# Patient Record
Sex: Female | Born: 1935 | Race: White | Hispanic: No | Smoking: Former smoker
Health system: Southern US, Community
[De-identification: ages and names within clinical notes are randomized; demographics above are authoritative.]

## PROBLEM LIST (undated history)

## (undated) DIAGNOSIS — H6123 Impacted cerumen, bilateral: Secondary | ICD-10-CM

## (undated) DIAGNOSIS — R112 Nausea with vomiting, unspecified: Secondary | ICD-10-CM

## (undated) DIAGNOSIS — G4700 Insomnia, unspecified: Secondary | ICD-10-CM

## (undated) DIAGNOSIS — T4145XA Adverse effect of unspecified anesthetic, initial encounter: Secondary | ICD-10-CM

## (undated) DIAGNOSIS — K449 Diaphragmatic hernia without obstruction or gangrene: Secondary | ICD-10-CM

## (undated) DIAGNOSIS — E559 Vitamin D deficiency, unspecified: Secondary | ICD-10-CM

## (undated) DIAGNOSIS — M8580 Other specified disorders of bone density and structure, unspecified site: Secondary | ICD-10-CM

## (undated) DIAGNOSIS — E7800 Pure hypercholesterolemia, unspecified: Secondary | ICD-10-CM

## (undated) DIAGNOSIS — Z8709 Personal history of other diseases of the respiratory system: Secondary | ICD-10-CM

## (undated) DIAGNOSIS — Z9889 Other specified postprocedural states: Secondary | ICD-10-CM

## (undated) DIAGNOSIS — T8859XA Other complications of anesthesia, initial encounter: Secondary | ICD-10-CM

## (undated) DIAGNOSIS — R7303 Prediabetes: Secondary | ICD-10-CM

## (undated) DIAGNOSIS — C3490 Malignant neoplasm of unspecified part of unspecified bronchus or lung: Secondary | ICD-10-CM

## (undated) DIAGNOSIS — D649 Anemia, unspecified: Secondary | ICD-10-CM

## (undated) DIAGNOSIS — F324 Major depressive disorder, single episode, in partial remission: Secondary | ICD-10-CM

## (undated) DIAGNOSIS — K5790 Diverticulosis of intestine, part unspecified, without perforation or abscess without bleeding: Secondary | ICD-10-CM

## (undated) DIAGNOSIS — M545 Low back pain: Secondary | ICD-10-CM

## (undated) DIAGNOSIS — L409 Psoriasis, unspecified: Secondary | ICD-10-CM

## (undated) DIAGNOSIS — K589 Irritable bowel syndrome without diarrhea: Secondary | ICD-10-CM

## (undated) DIAGNOSIS — M1990 Unspecified osteoarthritis, unspecified site: Secondary | ICD-10-CM

## (undated) DIAGNOSIS — N183 Chronic kidney disease, stage 3 (moderate): Secondary | ICD-10-CM

## (undated) DIAGNOSIS — F0390 Unspecified dementia without behavioral disturbance: Secondary | ICD-10-CM

## (undated) HISTORY — DX: Psoriasis, unspecified: L40.9

## (undated) HISTORY — PX: COLONOSCOPY: SHX174

## (undated) HISTORY — DX: Other specified disorders of bone density and structure, unspecified site: M85.80

## (undated) HISTORY — DX: Anemia, unspecified: D64.9

## (undated) HISTORY — DX: Prediabetes: R73.03

## (undated) HISTORY — DX: Unspecified dementia, unspecified severity, without behavioral disturbance, psychotic disturbance, mood disturbance, and anxiety: F03.90

## (undated) HISTORY — DX: Major depressive disorder, single episode, in partial remission: F32.4

## (undated) HISTORY — DX: Insomnia, unspecified: G47.00

## (undated) HISTORY — PX: LAPAROSCOPIC LYSIS INTESTINAL ADHESIONS: SUR778

## (undated) HISTORY — DX: Impacted cerumen, bilateral: H61.23

## (undated) HISTORY — DX: Chronic kidney disease, stage 3 (moderate): N18.3

## (undated) HISTORY — PX: BUNIONECTOMY: SHX129

## (undated) HISTORY — DX: Personal history of other diseases of the respiratory system: Z87.09

## (undated) HISTORY — DX: Unspecified osteoarthritis, unspecified site: M19.90

## (undated) HISTORY — DX: Pure hypercholesterolemia, unspecified: E78.00

## (undated) HISTORY — PX: APPENDECTOMY: SHX54

## (undated) HISTORY — DX: Vitamin D deficiency, unspecified: E55.9

## (undated) HISTORY — DX: Diaphragmatic hernia without obstruction or gangrene: K44.9

## (undated) HISTORY — DX: Irritable bowel syndrome, unspecified: K58.9

## (undated) HISTORY — PX: POLYPECTOMY: SHX149

## (undated) HISTORY — DX: Malignant neoplasm of unspecified part of unspecified bronchus or lung: C34.90

## (undated) HISTORY — DX: Diverticulosis of intestine, part unspecified, without perforation or abscess without bleeding: K57.90

## (undated) HISTORY — DX: Low back pain: M54.5

---

## 1967-06-18 HISTORY — PX: ABDOMINAL HYSTERECTOMY: SHX81

## 1974-06-17 HISTORY — PX: CHOLECYSTECTOMY: SHX55

## 1998-03-02 ENCOUNTER — Other Ambulatory Visit: Admission: RE | Admit: 1998-03-02 | Discharge: 1998-03-02 | Payer: Self-pay | Admitting: Obstetrics and Gynecology

## 1998-06-01 ENCOUNTER — Emergency Department (HOSPITAL_COMMUNITY): Admission: EM | Admit: 1998-06-01 | Discharge: 1998-06-02 | Payer: Self-pay | Admitting: Emergency Medicine

## 1999-03-19 ENCOUNTER — Other Ambulatory Visit: Admission: RE | Admit: 1999-03-19 | Discharge: 1999-03-19 | Payer: Self-pay | Admitting: Obstetrics and Gynecology

## 1999-06-18 HISTORY — PX: OTHER SURGICAL HISTORY: SHX169

## 1999-12-27 ENCOUNTER — Ambulatory Visit (HOSPITAL_BASED_OUTPATIENT_CLINIC_OR_DEPARTMENT_OTHER): Admission: RE | Admit: 1999-12-27 | Discharge: 1999-12-27 | Payer: Self-pay | Admitting: Orthopedic Surgery

## 2000-01-29 ENCOUNTER — Ambulatory Visit (HOSPITAL_BASED_OUTPATIENT_CLINIC_OR_DEPARTMENT_OTHER): Admission: RE | Admit: 2000-01-29 | Discharge: 2000-01-29 | Payer: Self-pay | Admitting: Orthopedic Surgery

## 2000-03-24 ENCOUNTER — Other Ambulatory Visit: Admission: RE | Admit: 2000-03-24 | Discharge: 2000-03-24 | Payer: Self-pay | Admitting: Obstetrics and Gynecology

## 2001-05-12 ENCOUNTER — Other Ambulatory Visit: Admission: RE | Admit: 2001-05-12 | Discharge: 2001-05-12 | Payer: Self-pay | Admitting: Obstetrics and Gynecology

## 2003-08-29 ENCOUNTER — Other Ambulatory Visit: Admission: RE | Admit: 2003-08-29 | Discharge: 2003-08-29 | Payer: Self-pay | Admitting: Obstetrics and Gynecology

## 2003-09-01 ENCOUNTER — Ambulatory Visit (HOSPITAL_COMMUNITY): Admission: RE | Admit: 2003-09-01 | Discharge: 2003-09-01 | Payer: Self-pay | Admitting: Gastroenterology

## 2004-05-23 ENCOUNTER — Ambulatory Visit: Payer: Self-pay | Admitting: Pulmonary Disease

## 2004-06-04 ENCOUNTER — Ambulatory Visit: Payer: Self-pay

## 2004-06-17 DIAGNOSIS — Z85118 Personal history of other malignant neoplasm of bronchus and lung: Secondary | ICD-10-CM

## 2004-06-17 HISTORY — DX: Personal history of other malignant neoplasm of bronchus and lung: Z85.118

## 2004-06-21 ENCOUNTER — Ambulatory Visit: Payer: Self-pay | Admitting: Pulmonary Disease

## 2004-08-29 ENCOUNTER — Other Ambulatory Visit: Admission: RE | Admit: 2004-08-29 | Discharge: 2004-08-29 | Payer: Self-pay | Admitting: Addiction Medicine

## 2004-10-05 ENCOUNTER — Ambulatory Visit: Payer: Self-pay | Admitting: Pulmonary Disease

## 2004-10-12 ENCOUNTER — Ambulatory Visit: Payer: Self-pay | Admitting: Internal Medicine

## 2004-11-07 ENCOUNTER — Ambulatory Visit (HOSPITAL_COMMUNITY): Admission: RE | Admit: 2004-11-07 | Discharge: 2004-11-07 | Payer: Self-pay | Admitting: Thoracic Surgery

## 2004-11-15 HISTORY — PX: OTHER SURGICAL HISTORY: SHX169

## 2004-11-20 ENCOUNTER — Encounter (INDEPENDENT_AMBULATORY_CARE_PROVIDER_SITE_OTHER): Payer: Self-pay | Admitting: *Deleted

## 2004-11-20 ENCOUNTER — Inpatient Hospital Stay (HOSPITAL_COMMUNITY): Admission: RE | Admit: 2004-11-20 | Discharge: 2004-11-24 | Payer: Self-pay | Admitting: Thoracic Surgery

## 2004-12-04 ENCOUNTER — Encounter: Admission: RE | Admit: 2004-12-04 | Discharge: 2004-12-04 | Payer: Self-pay | Admitting: Thoracic Surgery

## 2004-12-13 ENCOUNTER — Ambulatory Visit: Payer: Self-pay | Admitting: Pulmonary Disease

## 2004-12-13 ENCOUNTER — Ambulatory Visit (HOSPITAL_COMMUNITY): Admission: RE | Admit: 2004-12-13 | Discharge: 2004-12-13 | Payer: Self-pay | Admitting: Gastroenterology

## 2004-12-14 ENCOUNTER — Ambulatory Visit: Payer: Self-pay | Admitting: Gastroenterology

## 2004-12-26 ENCOUNTER — Encounter: Admission: RE | Admit: 2004-12-26 | Discharge: 2004-12-26 | Payer: Self-pay | Admitting: Thoracic Surgery

## 2005-01-23 ENCOUNTER — Encounter: Admission: RE | Admit: 2005-01-23 | Discharge: 2005-01-23 | Payer: Self-pay | Admitting: Thoracic Surgery

## 2005-05-01 ENCOUNTER — Encounter: Admission: RE | Admit: 2005-05-01 | Discharge: 2005-05-01 | Payer: Self-pay | Admitting: Thoracic Surgery

## 2005-09-04 ENCOUNTER — Encounter: Admission: RE | Admit: 2005-09-04 | Discharge: 2005-09-04 | Payer: Self-pay | Admitting: Thoracic Surgery

## 2005-12-17 ENCOUNTER — Encounter: Admission: RE | Admit: 2005-12-17 | Discharge: 2005-12-17 | Payer: Self-pay | Admitting: Thoracic Surgery

## 2006-01-01 ENCOUNTER — Ambulatory Visit: Payer: Self-pay | Admitting: Pulmonary Disease

## 2006-01-06 ENCOUNTER — Ambulatory Visit: Payer: Self-pay | Admitting: Pulmonary Disease

## 2006-02-19 ENCOUNTER — Ambulatory Visit: Payer: Self-pay | Admitting: Pulmonary Disease

## 2006-04-02 ENCOUNTER — Ambulatory Visit: Payer: Self-pay | Admitting: Pulmonary Disease

## 2006-04-24 ENCOUNTER — Ambulatory Visit: Payer: Self-pay | Admitting: Pulmonary Disease

## 2006-04-24 LAB — CONVERTED CEMR LAB
ALT: 27 units/L (ref 0–40)
AST: 25 units/L (ref 0–37)
Albumin: 4 g/dL (ref 3.5–5.2)
Alkaline Phosphatase: 64 units/L (ref 39–117)
BUN: 20 mg/dL (ref 6–23)
CO2: 29 meq/L (ref 19–32)
Calcium: 10 mg/dL (ref 8.4–10.5)
Chloride: 106 meq/L (ref 96–112)
Chol/HDL Ratio, serum: 3.8
Cholesterol: 181 mg/dL (ref 0–200)
Creatinine, Ser: 1 mg/dL (ref 0.4–1.2)
GFR calc non Af Amer: 58 mL/min
Glomerular Filtration Rate, Af Am: 70 mL/min/{1.73_m2}
Glucose, Bld: 101 mg/dL — ABNORMAL HIGH (ref 70–99)
HDL: 47.9 mg/dL (ref >39.0)
LDL Cholesterol: 95 mg/dL (ref 0–99)
Potassium: 4.8 meq/L (ref 3.5–5.1)
Sodium: 141 meq/L (ref 135–145)
Total Bilirubin: 1.1 mg/dL (ref 0.3–1.2)
Total Protein: 7.1 g/dL (ref 6.0–8.3)
Triglyceride fasting, serum: 191 mg/dL — ABNORMAL HIGH (ref 0–149)
VLDL: 38 mg/dL (ref 0–40)

## 2006-06-27 ENCOUNTER — Ambulatory Visit: Payer: Self-pay | Admitting: Pulmonary Disease

## 2006-06-27 LAB — CONVERTED CEMR LAB
Bilirubin Urine: NEGATIVE
Ketones, ur: NEGATIVE mg/dL
Leukocytes, UA: NEGATIVE
Nitrite: NEGATIVE
Specific Gravity, Urine: <=1.005 (ref 1.000–1.03)
Total Protein, Urine: NEGATIVE mg/dL
Urine Glucose: NEGATIVE mg/dL
Urobilinogen, UA: 0.2 (ref 0.0–1.0)
pH: 6.5 (ref 5.0–8.0)

## 2006-08-19 ENCOUNTER — Ambulatory Visit: Payer: Self-pay | Admitting: Thoracic Surgery

## 2006-08-19 ENCOUNTER — Encounter: Admission: RE | Admit: 2006-08-19 | Discharge: 2006-08-19 | Payer: Self-pay | Admitting: Thoracic Surgery

## 2006-09-08 ENCOUNTER — Other Ambulatory Visit: Admission: RE | Admit: 2006-09-08 | Discharge: 2006-09-08 | Payer: Self-pay | Admitting: Obstetrics and Gynecology

## 2007-02-04 ENCOUNTER — Ambulatory Visit: Payer: Self-pay | Admitting: Pulmonary Disease

## 2007-02-04 LAB — CONVERTED CEMR LAB
ALT: 44 units/L — ABNORMAL HIGH (ref 0–35)
AST: 32 units/L (ref 0–37)
Albumin: 4.2 g/dL (ref 3.5–5.2)
Alkaline Phosphatase: 84 units/L (ref 39–117)
BUN: 20 mg/dL (ref 6–23)
Basophils Absolute: 0 10*3/uL (ref 0.0–0.1)
Basophils Relative: 0.5 % (ref 0.0–1.0)
Bilirubin Urine: NEGATIVE
Bilirubin, Direct: 0.2 mg/dL (ref 0.0–0.3)
CO2: 27 meq/L (ref 19–32)
Calcium: 9.9 mg/dL (ref 8.4–10.5)
Chloride: 106 meq/L (ref 96–112)
Creatinine, Ser: 0.9 mg/dL (ref 0.4–1.2)
Crystals: NEGATIVE
Eosinophils Absolute: 0.3 10*3/uL (ref 0.0–0.6)
Eosinophils Relative: 3.3 % (ref 0.0–5.0)
GFR calc Af Amer: 79 mL/min
GFR calc non Af Amer: 66 mL/min
Glucose, Bld: 101 mg/dL — ABNORMAL HIGH (ref 70–99)
HCT: 38.1 % (ref 36.0–46.0)
Hemoglobin: 13.3 g/dL (ref 12.0–15.0)
Ketones, ur: NEGATIVE mg/dL
Lymphocytes Relative: 23.1 % (ref 12.0–46.0)
MCHC: 35 g/dL (ref 30.0–36.0)
MCV: 87.8 fL (ref 78.0–100.0)
Monocytes Absolute: 0.6 10*3/uL (ref 0.2–0.7)
Monocytes Relative: 8.2 % (ref 3.0–11.0)
Mucus, UA: NEGATIVE
Neutro Abs: 5.2 10*3/uL (ref 1.4–7.7)
Neutrophils Relative %: 64.9 % (ref 43.0–77.0)
Nitrite: NEGATIVE
Platelets: 294 10*3/uL (ref 150–400)
Potassium: 4.9 meq/L (ref 3.5–5.1)
RBC: 4.33 M/uL (ref 3.87–5.11)
RDW: 12.7 % (ref 11.5–14.6)
Sodium: 141 meq/L (ref 135–145)
Specific Gravity, Urine: 1.02 (ref 1.000–1.03)
TSH: 1.27 microintl units/mL (ref 0.35–5.50)
Total Bilirubin: 1.1 mg/dL (ref 0.3–1.2)
Total Protein, Urine: NEGATIVE mg/dL
Total Protein: 7.5 g/dL (ref 6.0–8.3)
Urine Glucose: NEGATIVE mg/dL
Urobilinogen, UA: 0.2 (ref 0.0–1.0)
WBC: 7.9 10*3/uL (ref 4.5–10.5)
pH: 5.5 (ref 5.0–8.0)

## 2007-03-11 ENCOUNTER — Encounter: Admission: RE | Admit: 2007-03-11 | Discharge: 2007-03-11 | Payer: Self-pay | Admitting: Thoracic Surgery

## 2007-03-11 ENCOUNTER — Ambulatory Visit: Payer: Self-pay | Admitting: Thoracic Surgery

## 2007-03-18 HISTORY — PX: OTHER SURGICAL HISTORY: SHX169

## 2007-03-23 ENCOUNTER — Ambulatory Visit (HOSPITAL_COMMUNITY): Admission: RE | Admit: 2007-03-23 | Discharge: 2007-03-24 | Payer: Self-pay | Admitting: Obstetrics and Gynecology

## 2007-06-15 ENCOUNTER — Telehealth: Payer: Self-pay | Admitting: Pulmonary Disease

## 2007-06-23 ENCOUNTER — Ambulatory Visit: Payer: Self-pay | Admitting: Pulmonary Disease

## 2007-06-23 DIAGNOSIS — E7800 Pure hypercholesterolemia, unspecified: Secondary | ICD-10-CM | POA: Insufficient documentation

## 2007-06-23 HISTORY — DX: Pure hypercholesterolemia, unspecified: E78.00

## 2007-06-23 LAB — CONVERTED CEMR LAB
ALT: 25 units/L (ref 0–35)
AST: 23 units/L (ref 0–37)
Albumin: 4 g/dL (ref 3.5–5.2)
Alkaline Phosphatase: 63 units/L (ref 39–117)
Bilirubin, Direct: 0.2 mg/dL (ref 0.0–0.3)
Cholesterol: 162 mg/dL (ref 0–200)
HDL: 42.5 mg/dL (ref >39.0)
LDL Cholesterol: 80 mg/dL (ref 0–99)
Total Bilirubin: 1.2 mg/dL (ref 0.3–1.2)
Total CHOL/HDL Ratio: 3.8
Total Protein: 7.4 g/dL (ref 6.0–8.3)
Triglycerides: 198 mg/dL — ABNORMAL HIGH (ref 0–149)
VLDL: 40 mg/dL (ref 0–40)

## 2007-06-26 DIAGNOSIS — K589 Irritable bowel syndrome without diarrhea: Secondary | ICD-10-CM

## 2007-06-26 DIAGNOSIS — J309 Allergic rhinitis, unspecified: Secondary | ICD-10-CM | POA: Insufficient documentation

## 2007-06-26 DIAGNOSIS — K219 Gastro-esophageal reflux disease without esophagitis: Secondary | ICD-10-CM | POA: Insufficient documentation

## 2007-06-26 DIAGNOSIS — K449 Diaphragmatic hernia without obstruction or gangrene: Secondary | ICD-10-CM

## 2007-06-26 DIAGNOSIS — M1990 Unspecified osteoarthritis, unspecified site: Secondary | ICD-10-CM | POA: Insufficient documentation

## 2007-06-26 DIAGNOSIS — Z8679 Personal history of other diseases of the circulatory system: Secondary | ICD-10-CM | POA: Insufficient documentation

## 2007-06-26 HISTORY — DX: Diaphragmatic hernia without obstruction or gangrene: K44.9

## 2007-06-26 HISTORY — DX: Gastro-esophageal reflux disease without esophagitis: K21.9

## 2007-06-26 HISTORY — DX: Irritable bowel syndrome, unspecified: K58.9

## 2007-07-30 ENCOUNTER — Telehealth: Payer: Self-pay | Admitting: Pulmonary Disease

## 2007-08-06 ENCOUNTER — Telehealth: Payer: Self-pay | Admitting: Pulmonary Disease

## 2007-08-27 ENCOUNTER — Encounter: Payer: Self-pay | Admitting: Pulmonary Disease

## 2007-09-09 ENCOUNTER — Ambulatory Visit: Payer: Self-pay | Admitting: Thoracic Surgery

## 2007-09-09 ENCOUNTER — Encounter: Payer: Self-pay | Admitting: Pulmonary Disease

## 2007-09-09 ENCOUNTER — Encounter: Admission: RE | Admit: 2007-09-09 | Discharge: 2007-09-09 | Payer: Self-pay | Admitting: Thoracic Surgery

## 2007-12-10 ENCOUNTER — Telehealth: Payer: Self-pay | Admitting: Pulmonary Disease

## 2008-03-15 ENCOUNTER — Ambulatory Visit: Payer: Self-pay | Admitting: Thoracic Surgery

## 2008-03-15 ENCOUNTER — Encounter: Admission: RE | Admit: 2008-03-15 | Discharge: 2008-03-15 | Payer: Self-pay | Admitting: Thoracic Surgery

## 2008-06-13 ENCOUNTER — Telehealth (INDEPENDENT_AMBULATORY_CARE_PROVIDER_SITE_OTHER): Payer: Self-pay | Admitting: *Deleted

## 2008-08-03 ENCOUNTER — Ambulatory Visit: Payer: Self-pay | Admitting: Pulmonary Disease

## 2008-08-03 DIAGNOSIS — Z87440 Personal history of urinary (tract) infections: Secondary | ICD-10-CM | POA: Insufficient documentation

## 2008-08-03 DIAGNOSIS — M8580 Other specified disorders of bone density and structure, unspecified site: Secondary | ICD-10-CM | POA: Insufficient documentation

## 2008-08-03 DIAGNOSIS — K5730 Diverticulosis of large intestine without perforation or abscess without bleeding: Secondary | ICD-10-CM | POA: Insufficient documentation

## 2008-08-03 DIAGNOSIS — C3490 Malignant neoplasm of unspecified part of unspecified bronchus or lung: Secondary | ICD-10-CM | POA: Insufficient documentation

## 2008-08-03 DIAGNOSIS — M545 Low back pain, unspecified: Secondary | ICD-10-CM | POA: Insufficient documentation

## 2008-08-03 DIAGNOSIS — G4700 Insomnia, unspecified: Secondary | ICD-10-CM | POA: Insufficient documentation

## 2008-08-03 DIAGNOSIS — J40 Bronchitis, not specified as acute or chronic: Secondary | ICD-10-CM | POA: Insufficient documentation

## 2008-08-08 ENCOUNTER — Ambulatory Visit: Payer: Self-pay | Admitting: Pulmonary Disease

## 2008-08-08 LAB — CONVERTED CEMR LAB: Vit D, 25-Hydroxy: 42 ng/mL (ref 30–89)

## 2008-08-20 LAB — CONVERTED CEMR LAB
ALT: 29 units/L (ref 0–35)
AST: 31 units/L (ref 0–37)
Albumin: 4 g/dL (ref 3.5–5.2)
Alkaline Phosphatase: 51 units/L (ref 39–117)
BUN: 15 mg/dL (ref 6–23)
Basophils Absolute: 0.2 10*3/uL — ABNORMAL HIGH (ref 0.0–0.1)
Basophils Relative: 3.2 % — ABNORMAL HIGH (ref 0.0–3.0)
Bilirubin, Direct: 0.2 mg/dL (ref 0.0–0.3)
CO2: 30 meq/L (ref 19–32)
Calcium: 9.5 mg/dL (ref 8.4–10.5)
Chloride: 104 meq/L (ref 96–112)
Cholesterol: 132 mg/dL (ref 0–200)
Creatinine, Ser: 1 mg/dL (ref 0.4–1.2)
Eosinophils Absolute: 0.3 10*3/uL (ref 0.0–0.7)
Eosinophils Relative: 5.1 % — ABNORMAL HIGH (ref 0.0–5.0)
GFR calc Af Amer: 70 mL/min
GFR calc non Af Amer: 58 mL/min
Glucose, Bld: 102 mg/dL — ABNORMAL HIGH (ref 70–99)
HCT: 38 % (ref 36.0–46.0)
HDL: 40.8 mg/dL (ref >39.0)
Hemoglobin: 13.2 g/dL (ref 12.0–15.0)
LDL Cholesterol: 62 mg/dL (ref 0–99)
Lymphocytes Relative: 19.5 % (ref 12.0–46.0)
MCHC: 34.6 g/dL (ref 30.0–36.0)
MCV: 87.2 fL (ref 78.0–100.0)
Monocytes Absolute: 0.5 10*3/uL (ref 0.1–1.0)
Monocytes Relative: 7.5 % (ref 3.0–12.0)
Neutro Abs: 4 10*3/uL (ref 1.4–7.7)
Neutrophils Relative %: 64.7 % (ref 43.0–77.0)
Platelets: 238 10*3/uL (ref 150–400)
Potassium: 4.6 meq/L (ref 3.5–5.1)
RBC: 4.36 M/uL (ref 3.87–5.11)
RDW: 12.5 % (ref 11.5–14.6)
Sodium: 141 meq/L (ref 135–145)
TSH: 1.54 microintl units/mL (ref 0.35–5.50)
Total Bilirubin: 1.1 mg/dL (ref 0.3–1.2)
Total CHOL/HDL Ratio: 3.2
Total Protein: 6.8 g/dL (ref 6.0–8.3)
Triglycerides: 144 mg/dL (ref 0–149)
VLDL: 29 mg/dL (ref 0–40)
WBC: 6.2 10*3/uL (ref 4.5–10.5)

## 2008-08-29 ENCOUNTER — Encounter: Payer: Self-pay | Admitting: Pulmonary Disease

## 2008-09-05 ENCOUNTER — Ambulatory Visit: Payer: Self-pay | Admitting: Obstetrics and Gynecology

## 2008-09-13 ENCOUNTER — Ambulatory Visit: Payer: Self-pay | Admitting: Thoracic Surgery

## 2008-09-13 ENCOUNTER — Encounter: Admission: RE | Admit: 2008-09-13 | Discharge: 2008-09-13 | Payer: Self-pay | Admitting: Thoracic Surgery

## 2008-10-13 ENCOUNTER — Encounter: Payer: Self-pay | Admitting: Obstetrics and Gynecology

## 2008-10-13 ENCOUNTER — Ambulatory Visit: Payer: Self-pay | Admitting: Obstetrics and Gynecology

## 2008-10-13 ENCOUNTER — Other Ambulatory Visit: Admission: RE | Admit: 2008-10-13 | Discharge: 2008-10-13 | Payer: Self-pay | Admitting: Obstetrics and Gynecology

## 2008-10-24 ENCOUNTER — Ambulatory Visit: Payer: Self-pay | Admitting: Obstetrics and Gynecology

## 2009-01-04 ENCOUNTER — Ambulatory Visit: Payer: Self-pay | Admitting: Pulmonary Disease

## 2009-01-05 LAB — CONVERTED CEMR LAB
Folate: >20 ng/mL
Magnesium: 2.2 mg/dL
Vitamin B-12: 604 pg/mL

## 2009-02-09 ENCOUNTER — Encounter: Payer: Self-pay | Admitting: Pulmonary Disease

## 2009-03-07 ENCOUNTER — Ambulatory Visit: Payer: Self-pay | Admitting: Obstetrics and Gynecology

## 2009-03-10 ENCOUNTER — Ambulatory Visit: Payer: Self-pay | Admitting: Women's Health

## 2009-03-16 ENCOUNTER — Ambulatory Visit: Payer: Self-pay | Admitting: Women's Health

## 2009-03-22 ENCOUNTER — Encounter: Admission: RE | Admit: 2009-03-22 | Discharge: 2009-03-22 | Payer: Self-pay | Admitting: Thoracic Surgery

## 2009-03-22 ENCOUNTER — Ambulatory Visit: Payer: Self-pay | Admitting: Thoracic Surgery

## 2009-03-24 ENCOUNTER — Ambulatory Visit: Payer: Self-pay | Admitting: Pulmonary Disease

## 2009-06-19 ENCOUNTER — Telehealth: Payer: Self-pay | Admitting: Pulmonary Disease

## 2009-08-03 ENCOUNTER — Telehealth (INDEPENDENT_AMBULATORY_CARE_PROVIDER_SITE_OTHER): Payer: Self-pay | Admitting: *Deleted

## 2009-08-07 ENCOUNTER — Ambulatory Visit: Payer: Self-pay | Admitting: Pulmonary Disease

## 2009-08-11 ENCOUNTER — Ambulatory Visit: Payer: Self-pay | Admitting: Pulmonary Disease

## 2009-08-11 LAB — CONVERTED CEMR LAB
ALT: 40 units/L — ABNORMAL HIGH (ref 0–35)
Alkaline Phosphatase: 64 units/L (ref 39–117)
Basophils Relative: 0.5 % (ref 0.0–3.0)
Bilirubin, Direct: 0.2 mg/dL (ref 0.0–0.3)
CO2: 30 meq/L (ref 19–32)
Calcium: 9 mg/dL (ref 8.4–10.5)
Chloride: 106 meq/L (ref 96–112)
Direct LDL: 89.6 mg/dL
Eosinophils Relative: 5.6 % — ABNORMAL HIGH (ref 0.0–5.0)
Glucose, Bld: 94 mg/dL (ref 70–99)
HDL: 57.4 mg/dL (ref >39.00)
Lymphocytes Relative: 22.3 % (ref 12.0–46.0)
Monocytes Absolute: 0.5 10*3/uL (ref 0.1–1.0)
Monocytes Relative: 8.9 % (ref 3.0–12.0)
Neutrophils Relative %: 62.7 % (ref 43.0–77.0)
Platelets: 252 10*3/uL (ref 150.0–400.0)
Potassium: 4.3 meq/L (ref 3.5–5.1)
RBC: 4 M/uL (ref 3.87–5.11)
Sodium: 142 meq/L (ref 135–145)
Total Bilirubin: 0.9 mg/dL (ref 0.3–1.2)
Total CHOL/HDL Ratio: 3
Total Protein: 6.8 g/dL (ref 6.0–8.3)
Triglycerides: 271 mg/dL — ABNORMAL HIGH (ref 0.0–149.0)
VLDL: 54.2 mg/dL — ABNORMAL HIGH (ref 0.0–40.0)
WBC: 5.6 10*3/uL (ref 4.5–10.5)

## 2009-08-17 ENCOUNTER — Telehealth: Payer: Self-pay | Admitting: Pulmonary Disease

## 2009-09-20 ENCOUNTER — Ambulatory Visit: Payer: Self-pay | Admitting: Thoracic Surgery

## 2009-09-20 ENCOUNTER — Encounter: Admission: RE | Admit: 2009-09-20 | Discharge: 2009-09-20 | Payer: Self-pay | Admitting: Thoracic Surgery

## 2009-09-20 ENCOUNTER — Encounter: Payer: Self-pay | Admitting: Pulmonary Disease

## 2009-09-22 ENCOUNTER — Encounter: Payer: Self-pay | Admitting: Pulmonary Disease

## 2009-09-26 ENCOUNTER — Encounter: Payer: Self-pay | Admitting: Pulmonary Disease

## 2010-03-08 ENCOUNTER — Telehealth (INDEPENDENT_AMBULATORY_CARE_PROVIDER_SITE_OTHER): Payer: Self-pay | Admitting: *Deleted

## 2010-04-24 ENCOUNTER — Encounter: Payer: Self-pay | Admitting: Pulmonary Disease

## 2010-04-24 ENCOUNTER — Ambulatory Visit: Payer: Self-pay | Admitting: Thoracic Surgery

## 2010-04-24 ENCOUNTER — Encounter: Admission: RE | Admit: 2010-04-24 | Discharge: 2010-04-24 | Payer: Self-pay | Admitting: Thoracic Surgery

## 2010-06-17 HISTORY — PX: CATARACT EXTRACTION, BILATERAL: SHX1313

## 2010-06-28 ENCOUNTER — Telehealth: Payer: Self-pay | Admitting: Pulmonary Disease

## 2010-07-08 ENCOUNTER — Encounter: Payer: Self-pay | Admitting: Thoracic Surgery

## 2010-07-19 NOTE — Progress Notes (Signed)
Summary: set up labs  Phone Note Call from Patient Call back at Home Phone [phone]   Caller: Patient Call For: nadel Summary of Call: pt wants labs set up for next week so she can come in any day.  Initial call taken by: Tivis Ringer, CNA,  August 03, 2009 10:44 AM  Follow-up for Phone Call        please advise what labs to place. Thanks. Carron Curie CMA  August 03, 2009 11:08 AM    labs are in computer for pt to come in next week for labs.  pt is aware. Randell Loop CMA  August 03, 2009 2:10 PM

## 2010-07-19 NOTE — Progress Notes (Signed)
Summary: rx request/ back pain  Phone Note Call from Patient Call back at Home Phone [phone]   Caller: Patient Call For: Saphira Lahmann Summary of Call: pt c/o back pain. this is "on going for yrs". says that she sees a chiropractor for this and that dr Kriste Basque is aware. today her chiropractor recommends that pt ask sn for a "cortozone pack" to help eliminate pain. pt understands that dr Kriste Basque is out until monday but wants a nurse to call her after checking on this (to see if rx can be called in). [facility] [address] Initial call taken by: Tivis Ringer, CNA,  August 17, 2009 12:00 PM  Follow-up for Phone Call        Spoke with pt and made aware that SN is out of the office until Monday 3/7.  I asked if she has ever seen ortho and she states that she did years ago and did not care to go back b/c they reccomended that she have surgery.  She would like to know if SN will give her cortisone pack for inflammation.  Pt aware msg will not be addressed until 08/21/09.  Follow-up by: Vernie Murders,  August 17, 2009 12:08 PM  Additional Follow-up for Phone Call Additional follow up Details #1::        per sn ok for medrol dose pak #1 as directed will need ov if no better after this Additional Follow-up by: Philipp Deputy CMA,  August 21, 2009 11:57 AM    Additional Follow-up for Phone Call Additional follow up Details #2::    rx sent. pt advised of recs. Carron Curie CMA  August 21, 2009 12:38 PM   New/Updated Medications: MEDROL (PAK) 4 MG TABS (METHYLPREDNISOLONE) as directed Prescriptions: MEDROL (PAK) 4 MG TABS (METHYLPREDNISOLONE) as directed  #1 pak x 0   Entered by:   Carron Curie CMA   Authorized by:   Michele Mcalpine MD   Signed by:   Carron Curie CMA on 08/21/2009   Method used:   Electronically to        [id number] ([facility])       [address]       [place], [place]  [address]       Ph: [phone] or [phone]       Fax: [phone]   RxID:   [id number]

## 2010-07-19 NOTE — Letter (Signed)
Summary: [facility]  [facility]   Imported By: Lennie Odor 10/10/2009 15:09:14  _____________________________________________________________________  External Attachment:    Type:   Image     Comment:   External Document

## 2010-07-19 NOTE — Assessment & Plan Note (Signed)
Summary: rov/ mbw   CC:  Yearly ROV & review of mult medical problems....  History of Present Illness: 77 y/o WF here for a follow up visit... she has multiple medical problems as noted below...    ~  August 11, 2009:  she has had a good year overall... still sees DrBurney for f/u LLL bronchoalveolar cell ca surg 2006- had CXR 3/10 & CT Chest 10/10 w/o new lesions seen... she had a purplexing "burning tongue" problems and was seen by dentists at [facility] who rec she stop her Boniva> she thinks symptoms improved off the Bisphos & doesn't want to take these meds... she had her 2010 flu vaccine.     Current Problems:   ALLERGIC RHINITIS (ICD-477.9) - she uses OTC Zyrtek Prn...  Hx of BRONCHITIS (ICD-490) - no recent URI's or bronchitic infections... she denies cough, sputum, hemoptysis, worsening dyspnea,  wheezing, chest pains, snoring, daytime hypersomnolence, etc...   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 she saw DrMohammed post-op, no adjuvant therapy recommended... she's been followed regularly by DrBurney since surg Q79mo w/ CXR, alt w/ CT scans...  ~  CXR 3/09 by DrBurney- no evid of recurrence, left pleural thickening, otherw neg...  ~  CT Chest 9/09 showed post-op changes, precarinal LN w/o change, 3mm nodule ant left lung w/o change, scar RML w/o change, thoracic spondy- NAD.Marland Kitchen.  ~  CXR 3/10 showed vol loss on left w/ blunt left angle, NAD.  ~  CT Chest 10/10 showed clear lungs w/o nodules or infiltrates, stable precarinal LN.  PALPITATIONS, HX OF (ICD-V12.50) - she takes ASA 81mg /d... notes rare palpit, no CP/ syncope/ dyspnea, edema, etc...  ~  NuclearStressTest 12/05 was neg- no scar or ischemia, EF= 70%...  HYPERCHOLESTEROLEMIA (ICD-272.0) - on SIMVASTATIN 40mg /d + low fat diet...  ~  FLP 11/07 showed TChol 181, TG -,  HDL 48, LDL 95  ~  FLP 1/09 showed TChol 162, TG 198, HDL 43, LDL 80  ~  FLP 2/10 showed TChol 132, TG 144, HDL 41, LDL 62  ~  FLP 2/11 showed TChol 167, TG 271, HDL 57, LDL 90... rec> better low fat diet.  HIATAL HERNIA (ICD-553.3), & GERD (ICD-530.81) - she states she is doing fine and does not require PPI therapy... 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  ~  2/11: she notes using occas Mylanta & advised to use Prilosec Prn.  DIVERTICULOSIS OF COLON (ICD-562.10), & IRRITABLE BOWEL SYNDROME (ICD-564.1) - last colonoscopy 3/05 by DrSamLeB showed tortuous colon, divertics, otherw neg... f/u planned 83yrs.  Hx of UTI'S, RECURRENT (ICD-599.0) - she had recurrent UTI's leading DrGottsegen to refer her to DrPeterson for surg w/ cystocele/ rectocele repairs & sling 10/08...  OSTEOARTHRITIS (ICD-715.90) - she has had trigger fingers and several injections by DrSypher...  ~  s/p bilat shoulder operations for impingement syndromes in 2001 by DrPaul...  BACK PAIN, LUMBAR (ICD-724.2) - she still sees her chiropractor every month for LBP...  OSTEOPENIA (ICD-733.90) - she states that BMD's are done by DrGottsegen and she was prev on Boniva but stopped this in 2010 due to "burning tongue" & ultimately has dental consult at [facility] w/ rec to stop the Bishos Rx (symptoms did abate somewhat)... she takes Calcium, MVI, etc...  ~  BMD SER 5/08 showed TScores +  0.1 in Spine, and -2.0 in left FemNeck (sl worse than 31-Oct-2004)  ~  BMD in 10/31/2008 showed   INSOMNIA (ICD-780.52) - on Ambien 10mg  Prn...   Allergies: 1)  ! Codeine  Comments:  Nurse/Medical Assistant: The patient's medications and allergies were reviewed with the patient and were updated in the Medication and Allergy Lists.  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) PALPITATIONS, HX OF  (ICD-V12.50) HYPERCHOLESTEROLEMIA (ICD-272.0) HIATAL HERNIA (ICD-553.3) GERD (ICD-530.81) DIVERTICULOSIS OF COLON (ICD-562.10) IRRITABLE BOWEL SYNDROME (ICD-564.1) Hx of UTI'S, RECURRENT (ICD-599.0) OSTEOARTHRITIS (ICD-715.90) BACK PAIN, LUMBAR (ICD-724.2) OSTEOPENIA (ICD-733.90) INSOMNIA (ICD-780.52)  Past Surgical History:  S/P appendectomy S/P hysterectomy S/P cholecystectomy S/P bilat shoulder operations for impingement syndromes in 11/01/1999 by DrPaul S/P left lower lobectomy 6/06 via VATS & mini-thoracotomy for 1.5cm AdenoCa of lung by DrBurney S/P Urologic surg w/ cystocele/ rectocele repairs & sling 10/08 by DrPeterson  Family History: Reviewed history from 08/03/2008 and no changes required. 1 Sister died w/ lung cancer in 11/01/2006  Social History: she is an ex-smoker, quit 11/01/95 In high school she was 3rd in the nation in the 11-01-2022 freestyle!  Review of Systems      See HPI       The patient complains of dyspnea on exertion and difficulty walking.  The patient denies anorexia, fever, weight loss, weight gain, vision loss, decreased hearing, hoarseness, chest pain, syncope, peripheral edema, prolonged cough, headaches, hemoptysis, abdominal pain, melena, hematochezia, severe indigestion/heartburn, hematuria, incontinence, muscle weakness, suspicious skin lesions, transient blindness, depression, unusual weight change, abnormal bleeding, enlarged lymph nodes, and angioedema.         She has not been exercising & blames her back- still goes to Chiropractor monthly for adjustments.  Vital Signs:  Patient profile:   77 year old female Height:      63.5 inches Weight:      143.50 pounds BMI:     25.11 O2 Sat:      97 % on Room air Temp:     97.5 degrees F oral Pulse rate:   72 / minute BP sitting:   140 / 70  (left arm) Cuff size:   regular  Vitals Entered By: Randell Loop CMA (August 11, 2009 11:01 AM)  O2 Sat at Rest %:  97 O2 Flow:  Room air CC: Yearly ROV & review of  mult medical problems... Pain Assessment Patient in pain? yes      Onset of pain  back pain Comments meds updated today   Physical Exam  Additional Exam:  WD, WN, 77 y/o WF in NAD... GENERAL:  Alert & oriented; pleasant & cooperative. HEENT:  [place]/AT, EOM-wnl, PERRLA, EACs-clear, TMs-wnl, NOSE-clear, THROAT-clear & wnl. NECK:  Supple w/ fairROM; no JVD; normal carotid impulses w/o bruits; no thyromegaly or nodules palpated; no lymphadenopathy. CHEST:  Clear to P & A; without wheezes/ rales/ or rhonchi; scar from prev surg on left. HEART:  Regular Rhythm; without murmurs/ rubs/ or gallops heard... ABDOMEN:  Soft & nontender; normal bowel sounds; no organomegaly or masses detected. EXT: without deformities, mild arthritic changes; no varicose veins/ venous insuffic/ or edema. NEURO:  CN's intact; motor testing normal; sensory testing normal; gait normal & balance OK. DERM:  No lesions noted; no rash etc...    MISC. Report  Procedure date:  08/07/2009  Findings:      BMP (METABOL)   Sodium  142 mEq/L                   135-145   Potassium                 4.3 mEq/L                   3.5-5.1   Chloride                  106 mEq/L                   96-112   Carbon Dioxide            30 mEq/L                    19-32   Glucose                   94 mg/dL                    04-54   BUN                       12 mg/dL                    0-98   Creatinine                0.9 mg/dL                   1.1-9.1   Calcium                   9.0 mg/dL                   4.7-82.9   GFR                       65.00 mL/min                >60  Lipid Panel (LIPID)   Cholesterol               167 mg/dL                   5-621   Triglycerides        [H]  271.0 mg/dL                 3.0-865.7   HDL                       84.69 mg/dL                 >62.95 Cholesterol LDL - Direct                             89.6 mg/dL  CBC Platelet w/Diff (CBCD)   White Cell Count          5.6 K/uL                     4.5-10.5   Red Cell Count            4.00 Mil/uL                 3.87-5.11   Hemoglobin                12.3  g/dL                   57.8-46.9   Hematocrit           [L]  35.9 %                      36.0-46.0   MCV                       89.6 fl                     78.0-100.0   Platelet Count            252.0 K/uL                  150.0-400.0   Neutrophil %              62.7 %                      43.0-77.0   Lymphocyte %              22.3 %                      12.0-46.0   Monocyte %                8.9 %                       3.0-12.0   Eosinophils%         [H]  5.6 %                       0.0-5.0   Basophils %               0.5 %   Comments:      Hepatic/Liver Function Panel (HEPATIC)   Total Bilirubin           0.9 mg/dL                   6.2-9.5   Direct Bilirubin          0.2 mg/dL                   2.8-4.1   Alkaline Phosphatase      64 U/L                      39-117   AST                       31 U/L                      0-37   ALT                  [H]  40 U/L                      0-35   Total Protein             6.8 g/dL                    3.2-4.4   Albumin                   3.7 g/dL  3.5-5.2  TSH (TSH)   FastTSH                   2.20 uIU/mL                 0.35-5.50  Vitamin D (25-Hydroxy) (40102)  Vitamin D (25-Hydroxy)                             45 ng/mL                    30-89   Impression & Recommendations:  Problem # 1:  Hx of ADENOCARCINOMA, 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 asymptomatic but too sedentary etc...  Followed by DrBurney Q71mo w/ CXR alt w/ CT Scans...  Problem # 2:  PALPITATIONS, HX OF (ICD-V12.50) She gets rare palpit... no CP, syncope, etc...  Problem # 3:  HYPERCHOLESTEROLEMIA (ICD-272.0) Her TG are too high... discussed low fat diet.... she is not inclined to start a new med for this... for now rec to continue the Simva40 + diet. Her updated medication list for this problem  includes:    Zocor 40 Mg Tabs (Simvastatin) .Marland Kitchen... Take 1 tablet by mouth once a day  Problem # 4:  HIATAL HERNIA (ICD-553.3) GI is stable & up to date... rec Prilosec OTC... she will be due for a colonoscopy in 2012.  Problem # 5:  OSTEOPENIA (ICD-733.90) Her osteopenia was followed by DrGottsegen... off Bisphos due to odd mouth problem & recs from [facility]... The following medications were removed from the medication list:    Boniva 150 Mg Tabs (Ibandronate sodium) .Marland Kitchen... Take 1 tab by mouth each month...  Problem # 6:  OTHER MEDICAL PROBLEMS AS NOTED>>>  Complete Medication List: 1)  Zocor 40 Mg Tabs (Simvastatin) .... Take 1 tablet by mouth once a day 2)  Caltrate 600+d 600-400 Mg-unit Tabs (Calcium carbonate-vitamin d) .... Take 1 tab by mouth two times a day... 3)  Centrum Tabs (Multiple vitamins-minerals) 4)  Ambien 10 Mg Tabs (Zolpidem tartrate) .... Take by mouth as needed  Other Orders: Prescription Created Electronically [phone])  Patient Instructions: 1)  Today we updated your med list- see below.... 2)  We refilled your Simvastatin today...  3)  Continue your low fat diet & incr your exercise as able... 4)  Call for any problems... Prescriptions: ZOCOR 40 MG  TABS (SIMVASTATIN) Take 1 tablet by mouth once a day  #30 x prn   Entered and Authorized by:   Michele Mcalpine MD   Signed by:   Michele Mcalpine MD on 08/11/2009   Method used:   Print then Give to Patient   RxID:   [id number] ZOCOR 40 MG  TABS (SIMVASTATIN) Take 1 tablet by mouth once a day  #90 x 3   Entered by:   Randell Loop CMA   Authorized by:   Michele Mcalpine MD   Signed by:   Randell Loop CMA on 08/11/2009   Method used:   Faxed to ...       [facility]* (mail-order)             ,          Ph: [phone]       Fax: [phone]   RxID:   [id number]

## 2010-07-19 NOTE — Progress Notes (Signed)
Summary: refer gynecologist  Phone Note Call from Patient Call back at Home Phone [phone]   Caller: Patient Call For: nadel Reason for Call: Talk to Nurse Summary of Call: Patient asking for Dr. Kriste Basque to recommend a female gynecologist for her.  She is unhappy with her current gynecologist. Initial call taken by: Lehman Prom,  March 08, 2010 12:42 PM  Follow-up for Phone Call        Called and spoke with pt to verify msg.  She is unhappy with her current GYN and would like to know if there is a female GYN that SN would reccomend that she try.  She is currently seeing Dr Tomie China.  Pls advise, thanks! Follow-up by: Vernie Murders,  March 08, 2010 1:59 PM  Additional Follow-up for Phone Call Additional follow up Details #1::        SN is out of the office this afternoon--TP can you please rec a female GYN that we can refer the pt to?  thanks Randell Loop [facility]  March 08, 2010 2:01 PM     Additional Follow-up for Phone Call Additional follow up Details #2::    send to [facility] to check listing for female gyn  Follow-up by: Rubye Oaks NP,  March 08, 2010 2:19 PM  Additional Follow-up for Phone Call Additional follow up Details #3:: Details for Additional Follow-up Action Taken: order sent to [facility].Arman Filter LPN  March 08, 2010 4:57 PM

## 2010-07-19 NOTE — Letter (Signed)
Summary: [facility]  [facility]   Imported By: Sherian Rein 05/08/2010 07:50:08  _____________________________________________________________________  External Attachment:    Type:   Image     Comment:   External Document

## 2010-07-19 NOTE — Progress Notes (Signed)
Summary: labs  Phone Note Call from Patient   Caller: Patient Call For: nadel Summary of Call: does pt need to have labs done before appt on feb appt Initial call taken by: Rickard Patience,  June 19, 2009 9:30 AM  Follow-up for Phone Call        please advise if pt needs labs before f/u. Thanks. Carron Curie CMA  June 19, 2009 9:31 AM    if she wants to call the week prior to her ov in feb then we can do labs at that time.  thanks Randell Loop CMA  June 19, 2009 9:36 AM   pt advised. Carron Curie CMA  June 19, 2009 9:40 AM

## 2010-07-19 NOTE — Progress Notes (Signed)
Summary: prescription or appointment  Phone Note Call from Patient   Caller: Patient Call For: Dr. Kriste Basque Summary of Call: patient phoned she has a lot of head congestion, low grade fever, dry cough she wants to know what we reccomend for her to take of if she needs to be seen. Her nasal discharge is clear. She can be reached at [phone] Initial call taken by: Vedia Coffer,  June 28, 2010 10:12 AM  Follow-up for Phone Call        Pt c/o head congestion. low grade fever, dry cough or 4 days.  Taking Advil C & S with little relief.  Please advise. Abigail Miyamoto RN  June 28, 2010 11:35 AM   Additional Follow-up for Phone Call Additional follow up Details #1::        per SN---ok for pt to have zpak #1  take as directed, otc mucinex 2 by mouth two times a day with plenty of fluids, delsym cough syrup and otc saline nasal spray.   thanks Randell Loop CMA  June 28, 2010 2:10 PM   pt advised of recs and rx sent. Carron Curie CMA  June 28, 2010 2:17 PM     New/Updated Medications: ZITHROMAX Z-PAK 250 MG TABS (AZITHROMYCIN) as directed Prescriptions: ZITHROMAX Z-PAK 250 MG TABS (AZITHROMYCIN) as directed  #1 pak x 0   Entered by:   Carron Curie CMA   Authorized by:   Michele Mcalpine MD   Signed by:   Carron Curie CMA on 06/28/2010   Method used:   Electronically to        [facility] ([facility])       [address]       [place], [place]  [address]       Ph: [phone] or [phone]       Fax: [phone]   RxID:   [id number]

## 2010-07-24 ENCOUNTER — Telehealth (INDEPENDENT_AMBULATORY_CARE_PROVIDER_SITE_OTHER): Payer: Self-pay | Admitting: *Deleted

## 2010-07-27 ENCOUNTER — Encounter (INDEPENDENT_AMBULATORY_CARE_PROVIDER_SITE_OTHER): Payer: Self-pay | Admitting: *Deleted

## 2010-07-27 ENCOUNTER — Other Ambulatory Visit: Payer: Self-pay | Admitting: Pulmonary Disease

## 2010-07-27 ENCOUNTER — Other Ambulatory Visit: Payer: Medicare Other

## 2010-07-27 DIAGNOSIS — R748 Abnormal levels of other serum enzymes: Secondary | ICD-10-CM

## 2010-07-27 DIAGNOSIS — E039 Hypothyroidism, unspecified: Secondary | ICD-10-CM

## 2010-07-27 DIAGNOSIS — I10 Essential (primary) hypertension: Secondary | ICD-10-CM

## 2010-07-27 DIAGNOSIS — N3000 Acute cystitis without hematuria: Secondary | ICD-10-CM

## 2010-07-27 DIAGNOSIS — E7800 Pure hypercholesterolemia, unspecified: Secondary | ICD-10-CM

## 2010-07-27 DIAGNOSIS — D649 Anemia, unspecified: Secondary | ICD-10-CM

## 2010-07-27 LAB — URINALYSIS, ROUTINE W REFLEX MICROSCOPIC
Ketones, ur: NEGATIVE
Total Protein, Urine: NEGATIVE
Urine Glucose: NEGATIVE
Urobilinogen, UA: 0.2 (ref 0.0–1.0)

## 2010-07-27 LAB — HEPATIC FUNCTION PANEL
Alkaline Phosphatase: 59 U/L (ref 39–117)
Bilirubin, Direct: 0.1 mg/dL (ref 0.0–0.3)
Total Bilirubin: 0.7 mg/dL (ref 0.3–1.2)
Total Protein: 6.6 g/dL (ref 6.0–8.3)

## 2010-07-27 LAB — BASIC METABOLIC PANEL
CO2: 30 mEq/L (ref 19–32)
Calcium: 10.3 mg/dL (ref 8.4–10.5)
Creatinine, Ser: 0.9 mg/dL (ref 0.4–1.2)
GFR: 66.53 mL/min (ref >60.00)
Sodium: 140 mEq/L (ref 135–145)

## 2010-07-27 LAB — CBC WITH DIFFERENTIAL/PLATELET
Basophils Absolute: 0 10*3/uL (ref 0.0–0.1)
Basophils Relative: 0.4 % (ref 0.0–3.0)
Eosinophils Absolute: 0.3 10*3/uL (ref 0.0–0.7)
HCT: 37.7 % (ref 36.0–46.0)
Hemoglobin: 12.8 g/dL (ref 12.0–15.0)
Lymphs Abs: 1.2 10*3/uL (ref 0.7–4.0)
MCHC: 33.8 g/dL (ref 30.0–36.0)
MCV: 88.5 fl (ref 78.0–100.0)
Monocytes Absolute: 0.4 10*3/uL (ref 0.1–1.0)
Neutro Abs: 3.6 10*3/uL (ref 1.4–7.7)
RBC: 4.26 Mil/uL (ref 3.87–5.11)
RDW: 14 % (ref 11.5–14.6)

## 2010-07-27 LAB — LIPID PANEL
HDL: 56.2 mg/dL (ref >39.00)
LDL Cholesterol: 84 mg/dL (ref 0–99)
Total CHOL/HDL Ratio: 3

## 2010-07-31 ENCOUNTER — Ambulatory Visit (INDEPENDENT_AMBULATORY_CARE_PROVIDER_SITE_OTHER): Payer: Medicare Other | Admitting: Pulmonary Disease

## 2010-07-31 ENCOUNTER — Encounter: Payer: Self-pay | Admitting: Pulmonary Disease

## 2010-07-31 DIAGNOSIS — K589 Irritable bowel syndrome without diarrhea: Secondary | ICD-10-CM

## 2010-07-31 DIAGNOSIS — K5730 Diverticulosis of large intestine without perforation or abscess without bleeding: Secondary | ICD-10-CM

## 2010-07-31 DIAGNOSIS — E7800 Pure hypercholesterolemia, unspecified: Secondary | ICD-10-CM

## 2010-07-31 DIAGNOSIS — C3490 Malignant neoplasm of unspecified part of unspecified bronchus or lung: Secondary | ICD-10-CM

## 2010-07-31 DIAGNOSIS — N390 Urinary tract infection, site not specified: Secondary | ICD-10-CM

## 2010-07-31 DIAGNOSIS — K219 Gastro-esophageal reflux disease without esophagitis: Secondary | ICD-10-CM

## 2010-07-31 DIAGNOSIS — J309 Allergic rhinitis, unspecified: Secondary | ICD-10-CM

## 2010-07-31 DIAGNOSIS — J40 Bronchitis, not specified as acute or chronic: Secondary | ICD-10-CM

## 2010-08-02 NOTE — Progress Notes (Signed)
Summary: set up labs  Phone Note Call from Patient Call back at Home Phone [phone]   Caller: Patient Call For: nadel Summary of Call: pt has appt w/ nadel on 2/14. wants labs set up in the meantime. also requests a urinalysis "just because she doesn't think she has had one in a while".  Initial call taken by: Tivis Ringer, CNA,  July 24, 2010 10:27 AM  Follow-up for Phone Call        Please advise on labs to be done prior to OV on 07-31-10.  Pt would also like a urinalysis. Abigail Miyamoto RN  July 24, 2010 11:30 AM    per sn lipid 272.0, basic metabolic panel 401.9, hep panel 790.5, cbc diff 285.9, tsh 244.9, udip 595. called and spoke with pt and she is coming in on 2/10 to have fasting labs drawn. Carver Fila  July 24, 2010 1:59 PM

## 2010-08-14 NOTE — Assessment & Plan Note (Signed)
Summary: 1 year rov/mhh   CC:  Yearly ROV & review of mult medical problems....  History of Present Illness: 77 y/o WF here for a follow up visit... she has multiple medical problems as noted below...    ~  August 11, 2009:  she has had a good year overall... still sees DrBurney for f/u LLL bronchoalveolar cell ca surg 2006- had CXR 3/10 & CT Chest 10/10 w/o new lesions seen... she had a purplexing "burning tongue" problems and was seen by dentists at [facility] who rec she stop her Boniva> she thinks symptoms improved off the Bisphos & doesn't want to take these meds... she had her 2010 flu vaccine.   ~  July 31, 2010:  Yearly ROV & still notes the burning tongueprob even w/o the Bisphos rx, pt notes that Prilosec makes it worse;  also c/o recent URI- allergy symptoms sneezing, sinus HA, etc> we discussed Rx w/ Antihist in AM, Saline nasal mist Q1-2H, Flonase Qhs; she recently had bilart cat surg.     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    She remains stable on Simva40 for chol;  GI is stable & she had her last colon 3/05 (tortuous, divertics, no polyps);  DJD & LBP w/ chiropractor rx;  as noted she is off Bisphos meds w/ osteopenia on BMD followed by GYN & [facility]...    Current Problems:   ALLERGIC RHINITIS (ICD-477.9) - she uses OTC Zyrtek Prn> advised sinus regimen w/ Antihist Qam, Nasal saline mist every 1-2H during the day, Mucinex 1-2 Bid w/ fluids, & Flonase Qhs...  Hx of BRONCHITIS (ICD-490) - no recent URI's or bronchitic infections... she denies cough, sputum, hemoptysis, worsening dyspnea,  wheezing, chest pains, snoring, daytime hypersomnolence, etc...   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 underwent LLLobectomy via VATS & minithoracotomy by DrBurney 6/06 w/ 1.5cm BRONCHOALVEOLAR CELL CANCER & neg nodes... she saw DrMohammed post-op, no adjuvant therapy recommended... she's been followed regularly by DrBurney since surg Q14mo w/ CXR, alt w/ CT scans...  ~  CXR 3/09 by DrBurney- no evid of recurrence, left pleural thickening, otherw neg...  ~  CT Chest 9/09 showed post-op changes, precarinal LN w/o change, 3mm nodule ant left lung w/o change, scar RML w/o change, thoracic spondy- NAD.Marland Kitchen.  ~  CXR 3/10 showed vol loss on left w/ blunt left angle, NAD.  ~  CT Chest 10/10 showed clear lungs w/o nodules or infiltrates, stable precarinal LN.  ~  CXR 4/11 revealed post op changes, NAD.Marland KitchenMarland Kitchen  ~  CT Chest 11/11 showed postop changes, NAD...  PALPITATIONS, HX OF (ICD-V12.50) - she takes ASA 81mg /d... notes rare palpit, no CP/ syncope/ dyspnea, edema, etc...  ~  NuclearStressTest 12/05 was neg- no scar or ischemia, EF= 70%...  HYPERCHOLESTEROLEMIA (ICD-272.0) - on SIMVASTATIN 40mg /d + low fat diet...  ~  FLP 11/07 showed TChol 181, TG -, HDL 48, LDL 95  ~  FLP 1/09 showed TChol 162, TG 198, HDL 43, LDL 80  ~  FLP 2/10 showed TChol 132, TG 144, HDL 41, LDL 62  ~  FLP 2/11 showed TChol 167, TG 271, HDL 57, LDL 90... rec> better  low fat diet.  ~  FLP 2/12 showed TChol 173, TG 165, HDL 56, LDL 84  HIATAL HERNIA (ICD-553.3), & GERD (ICD-530.81) - she states she is doing fine and does not require PPI therapy... 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  ~  2/11: she notes using occas Mylanta & advised to use Prilosec Prn.  DIVERTICULOSIS OF COLON (ICD-562.10), & IRRITABLE BOWEL SYNDROME (ICD-564.1) - last colonoscopy 3/05 by DrSamLeB showed tortuous colon, divertics, otherw neg...  Hx of UTI'S, RECURRENT (ICD-599.0) - she had recurrent UTI's leading DrGottsegen to refer her to DrPeterson for surg w/ cystocele/  rectocele repairs & sling 10/08...  OSTEOARTHRITIS (ICD-715.90) - she has had trigger fingers and several injections by DrSypher...  ~  s/p bilat shoulder operations for impingement syndromes in Nov 17, 1999 by DrPaul...  BACK PAIN, LUMBAR (ICD-724.2) - she still sees her chiropractor every month for LBP...  OSTEOPENIA (ICD-733.90) - she states that BMD's are done by DrGottsegen and she was prev on Boniva but stopped this in 11/16/08 due to "burning tongue" & ultimately has dental consult at [facility] w/ rec to stop the Bishos Rx (symptoms did abate somewhat)... she takes Calcium, MVI, etc...  ~  BMD SER 5/08 showed TScores +0.1 in Spine, and -2.0 in left FemNeck (sl worse than 2004-11-16)  ~  BMD in 8/10 at [facility] showed TScore ?Spine, and -1.9 in FemNeck (sl improved from 2006-11-17)  INSOMNIA (ICD-780.52) - on Ambien 10mg  Prn...  Health Maintenance:  ~  GYN:  she has a new GYN= DrMody on Premarin cream weekly...   Preventive Screening-Counseling & Management  Alcohol-Tobacco     Smoking Status: quit     Year Quit: 17-Nov-1995  Allergies: 1)  ! Codeine  Comments:  Nurse/Medical Assistant: The patient's medications and allergies were reviewed with the patient and were updated in the Medication and Allergy Lists.  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) - 1.5cm bronchalveolar cell ca resected 6/06 DrBurney PALPITATIONS, HX OF (ICD-V12.50) HYPERCHOLESTEROLEMIA (ICD-272.0) HIATAL HERNIA (ICD-553.3) GERD (ICD-530.81) DIVERTICULOSIS OF COLON (ICD-562.10) IRRITABLE BOWEL SYNDROME (ICD-564.1) Hx of UTI'S, RECURRENT (ICD-599.0) OSTEOARTHRITIS (ICD-715.90) BACK PAIN, LUMBAR (ICD-724.2) OSTEOPENIA (ICD-733.90) INSOMNIA (ICD-780.52)  Past Surgical History: S/P appendectomy S/P hysterectomy S/P cholecystectomy S/P bilat shoulder operations for impingement syndromes in 11/17/1999 by DrPaul S/P left lower lobectomy 6/06 via  VATS & mini-thoracotomy for 1.5cm AdenoCa of lung by DrBurney S/P Urologic surg w/ cystocele/ rectocele repairs & sling 10/08 by DrPeterson S/P bilat cataract Surg 2010/11/17 by Roanna Raider  Family History: Reviewed history from 08/03/2008 and no changes required. 1 Sister died w/ lung cancer in 11/17/2006  Social History: Reviewed history from 08/11/2009 and no changes required. she is an ex-smoker, quit 1997 In high school she was 3rd in the nation in the 17-Nov-2022 freestyle!  Review of Systems      See HPI  The patient denies anorexia, fever, weight loss, weight gain, vision loss, decreased hearing, hoarseness, chest pain, syncope, dyspnea on exertion, peripheral edema, prolonged cough, headaches, hemoptysis, abdominal pain, melena, hematochezia, severe indigestion/heartburn, hematuria, incontinence, muscle weakness, suspicious skin lesions, transient blindness, difficulty walking, depression, unusual weight change, abnormal bleeding, enlarged lymph nodes, and angioedema.    Vital Signs:  Patient profile:   77 year old female Height:      63.5 inches Weight:      146.50 pounds BMI:     25.64 O2  Sat:      98 % on Room air Temp:     99.0 degrees F oral Pulse rate:   71 / minute BP sitting:   122 / 78  (left arm) Cuff size:   regular  Vitals Entered By: Randell Loop CMA (July 31, 2010 10:24 AM)  O2 Sat at Rest %:  98 O2 Flow:  Room air CC: Yearly ROV & review of mult medical problems... Is Patient Diabetic? No Pain Assessment Patient in pain? no      Comments MEDS UPDATED TODAY WITH PT   Physical Exam  Additional Exam:  WD, WN, 77 y/o WF in NAD... GENERAL:  Alert & oriented; pleasant & cooperative. HEENT:  [place]/AT, EOM-wnl, PERRLA, EACs-clear, TMs-wnl, NOSE-clear, THROAT-clear & wnl. NECK:  Supple w/ fairROM; no JVD; normal carotid impulses w/o bruits; no thyromegaly or nodules palpated; no lymphadenopathy. CHEST:  Clear to P & A; without wheezes/ rales/ or rhonchi; scar from prev surg  on left. HEART:  Regular Rhythm; without murmurs/ rubs/ or gallops heard... ABDOMEN:  Soft & nontender; normal bowel sounds; no organomegaly or masses detected. EXT: without deformities, mild arthritic changes; no varicose veins/ venous insuffic/ or edema. NEURO:  CN's intact; motor testing normal; sensory testing normal; gait normal & balance OK. DERM:  No lesions noted; no rash etc...    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 DrBurney has diligently checked CXR every spring & CT Chest every fall & there has been no sign of recurrence, doing well... he now feels that he can sign off & suggests that we get yearly CXR & CT scan every other yr...  Problem # 2:  HYPERCHOLESTEROLEMIA (ICD-272.0) Stable on Simva40 + diet rx... Her updated medication list for this problem includes:    Zocor 40 Mg Tabs (Simvastatin) .Marland Kitchen... Take 1 tablet by mouth once a day  Problem # 3:  GERD (ICD-530.81) She denies reflu symptoms and has OTC meds to use Prn> Pepcid vs Prilosec...  Problem # 4:  DIVERTICULOSIS OF COLON (ICD-562.10) She is asymptomatioc & has last colonoscopy 3/05 w/ divertics & tort colon... no hx polypss..  Problem # 5:  OSTEOARTHRITIS (ICD-715.90) She has mild DJD & uses OTC meds as needed...  Problem # 6:  OSTEOPENIA (ICD-733.90) BMD shows osteopenia>  off Bisphos Rx w/ her odd hx of mouth burning (persist yrs after she's stopped the bisphos rx)...  BMD reviewed & followed by GYN & Rhea Bleacher...  Complete Medication List: 1)  Zocor 40 Mg Tabs (Simvastatin) .... Take 1 tablet by mouth once a day 2)  Caltrate 600+d 600-400 Mg-unit Tabs (Calcium carbonate-vitamin d) .... Take 1 tab by mouth two times a day... 3)  Centrum Tabs (Multiple vitamins-minerals) 4)  Ambien 10 Mg Tabs (Zolpidem tartrate) .... Take 1 tab by mouth at bedtime as needed for sleep 5)  Premarin 0.625 Mg/gm Crea  (Estrogens, conjugated) .... Use 1 time a week  Patient Instructions: 1)  Today we updated your med list- see below.... 2)  We refilled the Ambien per your request... 3)  We reviewed your recent lab work, and provided you w/ a copy for your records... 4)  Stay as active as possible... 5)  Call for any problems.Marland KitchenMarland Kitchen 6)  Please schedule a follow-up appointment in 1 year, with CXR & FASTING blood work at that time... Prescriptions: AMBIEN 10 MG  TABS (ZOLPIDEM TARTRATE) take 1 tab by  mouth at bedtime as needed for sleep  #30 x 12   Entered and Authorized by:   Michele Mcalpine MD   Signed by:   Michele Mcalpine MD on 07/31/2010   Method used:   Print then Give to Patient   RxID:   [id number]    Immunization History:  Influenza Immunization History:    Influenza:  historical (05/01/2010)

## 2010-09-18 ENCOUNTER — Telehealth: Payer: Self-pay | Admitting: Pulmonary Disease

## 2010-09-18 MED ORDER — SIMVASTATIN 40 MG PO TABS: 40.0000 mg | ORAL_TABLET | Freq: Every day | ORAL | Status: DC

## 2010-09-18 NOTE — Telephone Encounter (Signed)
Simvastatin 40mg  rx sent to [facility] - pt aware.

## 2010-10-05 ENCOUNTER — Encounter: Payer: Self-pay | Admitting: Pulmonary Disease

## 2010-10-30 NOTE — Op Note (Signed)
Krystal Craig, Krystal Craig              ACCOUNT NO.:  [id number]   MEDICAL RECORD NO.:  [id number]          PATIENT TYPE:  AMB   LOCATION:  DAY                          FACILITY:  [facility]   PHYSICIAN:  Maretta Bees. Vonita Moss, M.D.DATE OF BIRTH:  08-17-1932   DATE OF PROCEDURE:  03/23/2007  DATE OF DISCHARGE:                               OPERATIVE REPORT   PREOPERATIVE DIAGNOSIS:  Cystocele and mild stress incontinence,  documented on urodynamics.   POSTOPERATIVE DIAGNOSIS:  Cystocele and mild stress incontinence,  documented on urodynamics.   PROCEDURE:  Obturator sling insertion and cystoscopy.   SURGEON:  Maretta Bees. Vonita Moss, M.D.   ASSISTANT:  Rande Brunt. Eda Paschal, M.D.   INDICATIONS:  This 77 year old lady was brought the O.R. today for  cystocele and rectocele repair by Dr. Eda Paschal and insertion of an  obturator sling by me.  She has had pelvic pressure and a large  rectocele and a cystocele, and she underwent urodynamics, and with  reduction to the cystocele.  She had leak point pressures at 80 cmH2O.  She was counseled about the options of an obturator sling insertion at  this time, versus the risk of having difficulties later with worsening  problems with stress incontinence with reduction of the cystocele  postop.  I talked to her about the side effects of infection, erosion,  urinary leakage, bladder injury, or retention.   PROCEDURE:  The patient was brought to the operating room by Dr.  Eda Paschal, and after he opened up the anterior bladder wall the bladder  was emptied per Foley catheter.  Palpation used to develop the access to  the endopelvic fascia.  With the suburethral tissue nicely exposed, stab  wounds were made in the obturator fossa bilaterally at the level of the  clitoris.  Stab wounds were dissected further with blunt dissection.  The curved needle passer was then manipulated behind the posterior  aspect of the medial obturator bone and traversed toward the  endopelvic  fascia. and the needle passer was plate brought out  lateral to the  urethra and midurethral area.  In like fashion, at this point the left  side of the sling was brought up through the stab wounds by means of the  needle passer, and then another curved needle like fashion was passed  behind the obturator bone on the right side and brought out through the  endopelvic fascia and the periurethral area.  I then cystoscoped, and  the bladder urethra were unremarkable, and no evidence of injury or  perforation.  The right side of the sling was then brought up out  through the stab wound, and the sling was positioned midurethrally after  soaking it in antibiotic solution.  A clamp was placed between the  urethra, with the Foley catheter now reinserted, so that there was no  undue tension.  With  excellent placement of the sling as far as position and tension was  concerned, the excess sling was excised at the level of the puncture  wounds, and the puncture wounds were irrigated with antibiotic solution.  The cystocele and rectocele repair were then  dictated separately by Dr.  Eda Paschal.      Maretta Bees. Vonita Moss, M.D.  Electronically Signed     LJP/MEDQ  D:  03/23/2007  T:  03/23/2007  Job:  [id number]   cc:   Reuel Boom L. Eda Paschal, M.D.  Fax: [phone]

## 2010-10-30 NOTE — Letter (Signed)
March 11, 2007   Lonzo Cloud. Kriste Basque, MD  [address]  [place], [place] [address]   Re:  Krystal Craig, Krystal Craig              DOB:  [birth year]   Dear Dr. Kriste Basque,   I saw the patient back for follow up today.  Her CT scan showed no  evidence of recurrence.  She is now over 2 years since we did her right  lower lobectomy; which was 11/20/2004.   She had recently lost a sister to cancer, but at least today, her x-ray  was fine.  We will see her back again in 3 and 6 months with a chest x-  ray.   Ines Bloomer, M.D.  Electronically Signed   DPB/MEDQ  D:  03/11/2007  T:  03/12/2007  Job:  [id number]

## 2010-10-30 NOTE — Letter (Signed)
September 09, 2007   Lonzo Cloud. Kriste Basque, MD  [address]  [place], [place] [address]   Re:  CHARMION, HAPKE              DOB:  Nov 14, 1932   Dear Dr. Kriste Basque,   Ms. Walby returned today.  In October, she had cystocele and rectocele  surgery as well as a bladder sling.   Her blood pressure was 132/80, pulse 92, respirations 18, sats 97%.  Lungs are clear to auscultation and percussion.   Chest x-ray showed no evidence of recurrence.   I will see her back again in six months, which will be over three years  since her previous cancer.   Ines Bloomer, M.D.  Electronically Signed   DPB/MEDQ  D:  09/09/2007  T:  09/09/2007  Job:  [id number]

## 2010-10-30 NOTE — Assessment & Plan Note (Signed)
OFFICE VISIT   DENNI, FRANCE  DOB:  07/03/1932                                        March 22, 2009  CHART #:  [id number]   The patient came today.  She is now over 4 years since her surgery.  She  is doing well.  Her CT scan today showed no evidence of recurrence,  although that was just a preliminary report and we have not received the  final report.  Of course, I told her we would let her know if there is  any change in the final report and if not, we will see her back again in  6 months with a chest x-ray.   Ines Bloomer, M.D.  Electronically Signed   DPB/MEDQ  D:  03/22/2009  T:  03/23/2009  Job:  [id number]

## 2010-10-30 NOTE — Letter (Signed)
April 24, 2010   Lonzo Cloud. Kriste Basque, MD  [address]  [place], [place] [address]   Re:  SAMMI, STOLARZ              DOB:  03-19-1933   Dear Lorin Picket,   I saw the patient back for final followup today.  It has been 5 years  since we did her surgery and CT scan showed no evidence of recurrence of  her cancer which was a bronchoalveolar cancer.  Her blood pressure is  119/70, pulse 71, respirations 18, saturations were 96%.  Since she had  adenocarcinoma with bronchoalveolar features, she does have at least 3-  4% risk per year of having second lung cancer.  So, I would suggest that  she gets either chest x-ray or CT scan yearly from now on.  Since  bronchoalveolar is slow grow, it might be worth just getting a CT scan  every other year or every third year.   I will be happy to see her again if she has any further problems.   Ines Bloomer, M.D.  Electronically Signed   DPB/MEDQ  D:  04/24/2010  T:  04/25/2010  Job:  [id number]

## 2010-10-30 NOTE — Assessment & Plan Note (Signed)
OFFICE VISIT   Krystal Craig, Krystal Craig  DOB:  July 11, 1932                                        March 15, 2008  CHART #:  [id number]   The patient came today.  She is now over 3 years since her surgery and  her CT scan showed no evidence of recurrence.  She is doing well  overall.  She recently had a great-granddaughter born in [place].  Her  blood pressure is 124/77, pulse 90, respirations 18, and sats were 95%.  Lungs are clear to auscultation and percussion.  We will see her back  again in 6 months with a chest.    DICTATION ENDS HERE   Ines Bloomer, M.D.  Electronically Signed   DPB/MEDQ  D:  03/15/2008  T:  03/15/2008  Job:  [id number]

## 2010-10-30 NOTE — Assessment & Plan Note (Signed)
OFFICE VISIT   CARL, BLEECKER  DOB:  10/30/1932                                        September 13, 2008  CHART #:  [id number]   The patient returned today and her chest x-ray shows stable findings.  No evidence of recurrence of cancer and now 3 and half years since her  surgery.  Her blood pressure was 100/70, pulse 86, respirations 18, and  sats were 94%.  We will see her back in 6 months with a CT scan.   Ines Bloomer, M.D.  Electronically Signed   DPB/MEDQ  D:  09/13/2008  T:  09/14/2008  Job:  [id number]

## 2010-10-30 NOTE — Letter (Signed)
September 20, 2009   Lonzo Cloud. Kriste Basque, MD  [address]  [place], [place] [address]   Re:  Krystal Craig, Krystal Craig              DOB:  [birth year]   Dear Lorin Picket,   I saw the patient back today and she is doing remarkably well.  She is  now 5-1/2 years since her surgery.  Chest x-ray showed no evidence of  recurrence.  Her blood pressure is 119/74, pulse 92, respirations 18,  sats were 93%.  I will see her back again in 6 months with a CT scan a  for final check and then hopefully we will can refer her back to you for  long-term followup.   Ines Bloomer, M.D.  Electronically Signed   DPB/MEDQ  D:  09/20/2009  T:  09/21/2009  Job:  [id number]

## 2010-10-30 NOTE — Op Note (Signed)
Krystal Craig, Krystal Craig              ACCOUNT NO.:  [id number]   MEDICAL RECORD NO.:  [id number]          PATIENT TYPE:  AMB   LOCATION:  DAY                          FACILITY:  [facility]   PHYSICIAN:  Daniel L. Gottsegen, M.D.DATE OF BIRTH:  1933/05/16   DATE OF PROCEDURE:  03/23/2007  DATE OF DISCHARGE:                               OPERATIVE REPORT   PREOPERATIVE DIAGNOSIS:  Symptomatic cystocele and rectocele.   POSTOPERATIVE DIAGNOSIS:  Symptomatic cystocele and rectocele.   OPERATION:  Anterior and posterior repair, obturator sling and  cystoscopy.   ANESTHESIA:  Spinal.   SURGEONS:  Daniel L. Gottsegen, M.D./Lloyd J. Vonita Moss, M.D.   FINDINGS:  At the time of surgery, the patient had a second-degree  cystocele and third-degree rectocele.  She had excellent vault support.  She did not have an enterocele.   PROCEDURE:  After adequate spinal anesthesia, the patient was placed in  the dorsal lithotomy position and prepped and draped in the usual  sterile manner.  The vaginal cuff was identified.  An inverted T  incision was made and the vaginal mucosa was sharply dissected free from  the underlying cystocele and bladder until the entire anatomy could be  easily seen.  At this point, Dr. Vonita Moss did an obturator sling and a  cystoscopy.  This was done because when her cystocele was reduced in the  office during urodynamics, she did leak urine at 80 leak point pressure.  After he had done her sling and cystoscoped her, the cystocele repair  was then finished by bringing together the vesical fascia with  interrupteds of 2-0 Vicryl.  Redundant vaginal mucosa was trimmed away  and then the anterior vaginal mucosa was closed.  The fascia was also  picked up during the second layer closure to eliminate dead space and to  reinforce the repair.  The second layer was also 2-0 Vicryl.  Attention  was next turned to the posterior repair.  The patient had an adequate  perineum, so the repair  was started at the introitus.  It was taken to  the top of the cuff.  The perirectal fascia and the rectocele was  completely freed up from the mucosa.  The surgeon put a finger in the  rectum to finish the dissection and then he regloved.  The rectocele was  then reduced with interrupted sutures of 2-0 Vicryl incorporating  perirectal fascia to give a good support.  Redundant vaginal mucosa was  trimmed away and then the posterior vaginal mucosa was closed with a 2-0  Vicryl also picking up the fascia to reinforce the repair and also to  eliminate dead space.  When we got close to the introitus, the  transverse peritonei muscles were brought together with interrupted 2-0  Vicryl.  Two sponge, needle and instrument counts were correct.  Copious  irrigation was done three or four times during the procedure with  sterile saline.  A 1-inch iodoform pack was placed.  At the termination  of procedure, the patient left the operating room in satisfactory  condition draining clear urine from her Foley.  Daniel L. Eda Paschal, M.D.  Electronically Signed    DLG/MEDQ  D:  03/23/2007  T:  03/23/2007  Job:  [id number]

## 2010-10-30 NOTE — Discharge Summary (Signed)
Krystal Craig, Krystal Craig              ACCOUNT NO.:  [id number]   MEDICAL RECORD NO.:  [id number]          PATIENT TYPE:  OIB   LOCATION:  1527                         [facility]ACILITY[facility]   PHYSICIAN:  Daniel L. Gottsegen, M.D.DATE OF BIRTH:  09-Jun-1933   DATE OF ADMISSION:  03/23/2007  DATE OF DISCHARGE:  03/24/2007                               DISCHARGE SUMMARY   Krystal Craig is a 77 year old female with cystocele and rectocele, who was  admitted to the hospital for definitive surgery.  On the day of  admission she underwent an anterior and posterior repair by Dr.  Eda Paschal and an obturator sling by Dr. Vonita Moss.  Postoperatively she  was doing well so she was discharged on the first postoperative day on  cephalothin for antibiotic coverage and Darvocet-N 100 for pain relief  with her Foley in place.  She will remove her Foley herself tomorrow.  She will return to see both Dr. Vonita Moss and Dr. Eda Paschal in 2 weeks.  She was given full discharge instructions.  She will resume all  preoperative medication.   LABORATORY DATA:  CBC and metabolic panel were all normal.  There is no  pathology report.   CONDITION ON DISCHARGE:  Improved.   DISCHARGE DIAGNOSES:  1. Cystocele.  2. Rectocele.   OPERATION:  1. Anterior and posterior repair.  2. Obturator sling.      Daniel L. Eda Paschal, M.D.  Electronically Signed     DLG/MEDQ  D:  03/24/2007  T:  03/24/2007  Job:  [id number]   cc:   Maretta Bees. Vonita Moss, M.D.  Fax: [phone]

## 2010-10-30 NOTE — H&P (Signed)
Krystal Craig, Krystal Craig              ACCOUNT NO.:  [id number]   MEDICAL RECORD NO.:  [id number]          PATIENT TYPE:  AMB   LOCATION:  DAY                          FACILITY:  [facility]   PHYSICIAN:  Daniel L. Gottsegen, M.D.DATE OF BIRTH:  [birth year]   DATE OF ADMISSION:  03/23/2007  DATE OF DISCHARGE:                              HISTORY & PHYSICAL   CHIEF COMPLAINT:  Symptomatic cystocele and rectocele.   HISTORY OF PRESENT ILLNESS:  The patient is a 77 year old gravida 2,  para 2 AB 0 who presented to my office with complaints of suprapubic  pressure, pelvic pressure, urinary urgency, feeling that her bladder has  dropped, difficulty getting stool out.  She has also had frequent  urinary tract infections.  Along with her symptoms are physical findings  of both a cystocele and rectocele.  She was sent to Dr. Larey Dresser  for assessment of whether repair of this would leave her incontinent and  when he did urodynamics and replaced her cystocele, she did have a leak  point pressure of 80.  Therefore, she now enters the hospital for  anterior and posterior repair for correction of a cystocele and  rectocele and a sling procedure by Dr. Vonita Moss for prevention of  urinary stress incontinence.   PAST MEDICAL HISTORY:  1. The patient has had lung cancer in 2006, had a partial lung      removed.  It was her left.  2. She has previously had a total abdominal hysterectomy in 1969, at      which time she felt they did a bladder repair.  3. She had a cholecystectomy in 1976.  4. She has had previous stomach surgery.  5. Shoulder surgery.   PRESENT MEDICATIONS:  Include Zocor, Zyrtec and Actonel.   ALLERGIES:  CODEINE.   FAMILY HISTORY:  Sister and mother are hypertensive.  Sister has had  breast cancer.  Another sister had lung cancer that she did not survive.  Mother had coronary artery disease, and her sister also have bone  cancer.   SOCIAL HISTORY:  The patient is a nonsmoker.   She drinks alcohol  occasionally.  She does have caffeine.   REVIEW OF SYSTEMS:  GENERAL:  Negative.  HEENT is negative.  CARDIOVASCULAR:  Reveals a history of elevated cholesterol.  RESPIRATORY:  Reveals a history of lung cancer.  GI: Reveals a history  of diarrhea.  MUSCULOSKELETAL:  Negative.  GU:  Described above.  NEUROLOGICAL/PSYCHIATRIC:  Negative.  ALLERGIC/IMMUNOLOGICAL/LYMPHATIC/ENDOCRINE:  Negative.   PHYSICAL EXAMINATION:  VITAL SIGNS:  Blood pressure is 130/72, pulse 80 and regular,  respirations 16 and nonlabored.  She is afebrile.  HEENT:  Within normal limits.  NECK:  Supple.  Trachea midline.  Thyroid is not enlarged.  LUNGS:  Clear to P&A.  HEART:  No thrills, heaves or murmurs.  BREASTS:  No masses.  ABDOMEN:  Soft without guarding, rebound or masses.  PELVIC:  External is normal.  BUS is normal.  Bladder reveals a second-  degree cystocele.  Vaginal exam reveals a third-degree rectocele.  Cervix and uterus are absent.  There is no vault prolapse.  Adnexa fails  to reveal masses.  RECTAL:  Anus and perineum is normal.  Digital rectal exam was negative.   IMPRESSION:  Symptomatic cystocele and rectocele.   PLAN:  Surgical correction as described above.      Daniel L. Eda Paschal, M.D.  Electronically Signed     DLG/MEDQ  D:  03/23/2007  T:  03/23/2007  Job:  [id number]

## 2010-11-02 NOTE — Assessment & Plan Note (Signed)
[facility]                             PULMONARY OFFICE NOTE   NAME:Krystal Craig, Krystal Craig                     MRN:          [id number]  DATE:06/27/2006                            DOB:          1932/08/20    HISTORY OF PRESENT ILLNESS:  The patient is a 77 year old white female  patient of Dr. Jodelle Green who has a known history of gastroesophageal  reflux, hyperlipidemia and osteoarthritis who presents for an acute  office visit. The patient complains over the last 3 days she has had  sore throat, cough and nasal congestion with post nasal drip. The  patient denies any purulent sputum, fever, chest pain, orthopnea, PND.  The patient also has had some mild dysuria. She denies any hematuria,  urgency, frequency, or back pain or fever.   PAST MEDICAL HISTORY:  Reviewed.   CURRENT MEDICATIONS:  Reviewed.   PHYSICAL EXAMINATION:  GENERAL:  The patient is a pleasant female in no  acute distress.  VITAL SIGNS:  She is afebrile with stable vital signs. O2 saturation  is  97% on room air  HEENT:  Nasal mucosa is somewhat pale. Nontender sinuses. Posterior  pharynx is clear without any exudate.  NECK:  Supple without adenopathy.  LUNGS:  Lung sounds are clear.  CARDIAC:  Regular rate and rhythm.  ABDOMEN:  Soft and nontender, no hepatosplenomegaly. Negative  CVA  tenderness.  EXTREMITIES:  Warm without any calf tenderness, cyanosis, clubbing or  edema.   LABORATORY DATA:  Urinalysis is essentially unremarkable.   IMPRESSION/PLAN:  1. Acute upper respiratory infection and rhinitis flare. The patient      is to begin Magic mouthwash as needed for sore throat. Claritin 10      mg daily over the next 5 days. Add in Mucinex DM twice daily x1      week and then p.r.n. The patient is advised to return as scheduled      or sooner if needed.  2. Dysuria. Urinalysis is unremarkable. The patient is advised if      symptoms do not improve will need followup with her  gynecologist.      Possibly may have underlying atrophic vaginitis.     Rubye Oaks, NP  Electronically Signed      Lonzo Cloud. Kriste Basque, MD  Electronically Signed   TP/MedQ  DD: 06/30/2006  DT: 06/30/2006  Job #: [id number]

## 2010-11-02 NOTE — Op Note (Signed)
[name]  Patient:    Krystal Craig, Krystal Craig                     MRN: [id number] Proc. Date: 01/29/00 Adm. Date:  [id number] Disc. Date: [id number] Attending:  Drema Pry CC:         Dr. Marisue Ivan, Chiropractor   Operative Report  PREOPERATIVE DIAGNOSES: 1. Left shoulder impingement syndrome. 2. Acromioclavicular joint arthritis. 3. Rule out rotator cuff tear.  POSTOPERATIVE DIAGNOSES: 1. Left shoulder impingement syndrome. 2. Acromioclavicular joint arthritis. 3. Rule out rotator cuff tear. 4. No rotator cuff tear, only partial thickness changes in the critical zone.  SURGEON:  Jearld Adjutant, M.D.  ASSISTANT:  Vilinda Blanks. Moye, P.A.-C.  ANESTHESIA:  General endotracheal.  ESTIMATED BLOOD LOSS:  Minimal.  PATHOLOGIC FINDINGS AND HISTORY:  Krystal Craig presented in April from consultation by Dr. Marisue Ivan, a 77 year old female, who showed signs and symptoms of bilateral shoulder impingement tendonitis, AC joint arthritis, type 2-3 acromions.  She was injected bilaterally, did well, had exercises.  She was reinjected in the [facility] joints and then ultimately she persisted, so we took her to the operating room on the right, where she underwent subacromial arch decompression, distal clavicle resection and bursectomy.  She did beautifully from this operation and was so pleased, she elected to proceed on the left side.  At surgery, we found a sharp anterior acromion, thick subdeltoid bursa, fraying in critical zone and an obviously arthritic distal clavicle.  There was no through-and-through rotator cuff tear.  There was slight fraying of the anterior labrum, which we trimmed.  The glenohumeral joint looked good with only minor degenerative changes.  There was no SLAP lesion. PROCEDURE:  With adequate anesthesia obtained using endotracheal technique, 1 g Ancef given IV prophylaxis.  The patient was placed in the supine beach chair position.   The left shoulder was prepped and draped in the standard fashion.  Skin markings were made for anatomic positioning.  We then injected 20 cc of 0.5% Marcaine with epinephrine in the subacromial space to open it up.  Posterior portal was then established.  Anterior portal was established just lateral to the coracoid.  I then probed and then shaved on the anterior labrum, where it was somewhat frayed.  I reversed portals, checked the undersurface of the rotator cuff.  The inferior recess and the glenohumeral surfaces.  I then entered the subacromial space from the posterior portal. Anterolateral portal was established and shaver and ablator was brought in to remove soft tissues on the anterior undersurface of the acromion.  I then brought in the 6.0 bur, acromioplasty carried out to the roof of the subacromial space.  I then turned the scope sideways, removed soft tissue and cartilage with the basket from the anterior portal.  Then brought in the 6.0 bur and did a distal clavicle resection two shaverbreadths in.  Bleeding points were cauterized.  I then checked the shoulder from the lateral portal, trimmed the distal clavicle, shaved the acromion back to the bicortical bone in the manner of Caspari. I then looked down upon the rotator cuff, removed bursa with internal, external rotation and abduction and used the Ablator to smooth area, where it was frayed in the critical zone.  I also probed it to make sure there was no through-and-through tear.  When I was satisfied with the resection, the shoulder was irrigated through the scope and 0.5% Marcaine was injected in and about  the portals.  The portals were closed front and back with 4-0 nylon to the side portal was left open.  A bulky, sterile compressive dressing was applied with sling and the patient having tolerated procedure well, was awakened, taken to recovery room in satisfactory condition to be discharged per outpatient routine.  Given  Tylox for pain and told to call the office for appointment for recheck tomorrow. DD:  01/29/00 TD:  01/29/00 Job: [id number]

## 2010-11-02 NOTE — Consult Note (Signed)
NAMEARMENIA, SILVERIA              ACCOUNT NO.:  [id number]   MEDICAL RECORD NO.:  [id number]          PATIENT TYPE:  INP   LOCATION:  2025                         FACILITY:  [facility]   PHYSICIAN:  Lajuana Matte, MD  DATE OF BIRTH:  01-21-1933   DATE OF CONSULTATION:  11/23/2004  DATE OF DISCHARGE:  11/24/2004                                   CONSULTATION   REASON FOR CONSULTATION:  Stage I lung cancer.   REFERRING PHYSICIAN:  Dr. Edwyna Shell.   Ms. Bodkins is a pleasant 77 year old white female with a prior history of  smoking, found to have a left lower lobe mass in April of 2006 after the  patient presented to her primary care physician with progressive cough.  CT  scan of the chest confirmed a 1.7 x 2.8 cm left lower lobe lesion, with a  PET scan positive with an FEV of 2.6 at the region of the mass.  She was  referred to Dr. Edwyna Shell for further evaluation.  She underwent a left lower  lobectomy since the mass was suspicious for cancer.  The pathology report  was positive for adenocarcinoma, bronchial alveolar type, mucinous, 1.5 cm,  no visceral involvement.  No nares parenchymal resection margins  involvement.  Two level XII lymph nodes were positive.  Twelve lymph nodes,  two level.  She is T2 N0 Mx, stage I-A.   PAST MEDICAL HISTORY:  Lung cancer as above, prior history of tobacco abuse,  quitting in 1997, hypertension, irritable bowel syndrome,  hypercholesterolemia, known colon polyp followed that by colonoscopies,  atrial palpitations, allergic rhinitis, degenerative joint disease.   SURGERIES:  Status post left lower lobectomy Dr. Edwyna Shell on November 20, 2004.  Status post bilateral shoulder surgery in 2001, status post cholecystectomy,  status post appendectomy, status post partial hysterectomy.   ALLERGIES:  CODEINE.   CURRENT MEDICATIONS:  1.  Proventil nebulizer q.6h.  2.  Dulcolax 10 mg daily.  3.  P.r.n. medications include morphine sulfate PCA, Benadryl, Narcan,   Zofran, Compazine, and Darvocet, Senokot and Ultram.   REVIEW OF SYSTEMS:  See history of present illness for significant  positives, otherwise unremarkable.   FAMILY HISTORY:  Noncontributory with the exception of one sister with a  history of metastatic breast cancer to the lung.  No other history of cancer  in the family.   SOCIAL HISTORY:  The patient is married.  She has two children.  She is a  Customer service manager.  No alcohol history.  No tobacco use at this time.   PHYSICAL EXAMINATION:  GENERAL:  This is a 77 year old white female who  looks younger than her stated age.  Alert and oriented x3.  VITAL SIGNS:  Blood pressure 130/60, pulse 88, respirations 23, temperature  97.9, pulse oximetry 98% on room air.  Weight 147 pounds.  Height 5 feet 3  inches.  HEENT:  Normocephalic, atraumatic.  PERRLA.  Oral mucosa without thrush or  lesions.  NECK:  Supple.  No cervical or supraclavicular masses.  LUNGS:  With mild inspiratory and expiratory rhonchi, no wheezing or rales.  No  axillary masses.  There is a left bandaged area at the incisional site.  BREASTS:  Without masses.  CARDIOVASCULAR:  A regular rate and rhythm without murmurs, rubs or gallops.  ABDOMEN:  Soft and nontender.  Bowel sounds x4.  No palpable spleen or  liver.  GENITOURINARY/RECTAL:  Deferred.  EXTREMITIES:  No clubbing or cyanosis.  No edema.  SKIN:  Without lesions, bruising or petechia.  NEUROLOGICAL:  Nonfocal.   LABORATORY DATA:  Hemoglobin 10.6, hematocrit 30.9, white count 9.1,  platelets 281,000.  MCV 89.6.  Sodium 136, potassium 3.9, BUN 4, creatinine  0.7, glucose 108, total bilirubin 0.7, alkaline phosphatase 114, AST 102,  ALT 338, total protein 6.3, albumin 2.8, calcium 9.0.   RADIOLOGY:  PET scan showing a 3 cm wedge-shaped peripheral-based opacity,  with an FEV of 2.6 as of Nov 07, 2004, prior to the lobectomy.  CT scan of  the chest confirms this mass on October 12, 2004.   ASSESSMENT AND  PLAN:  Dr. Arbutus Ped has seen and evaluated the patient and the  chart has been reviewed.  This is a very pleasant 77 year old female who was  recently diagnosed with stage IA, T1 N0 Mx non-small cell lung carcinoma,  adenocarcinoma of the bronchial alveolar type, status post left lower  lobectomy.  There is no survival benefit of adjuvant chemotherapy in stage  IA and the pat will be monitored with observation and repeat CT scans.  Dr.  Arbutus Ped discussed with her chemotherapy prevention trial with selenium  versus placebo.  The patient is interested in going it. Dr. Arbutus Ped will  arrange a follow-up appointment for her with him at the [facility] in about two months after discharge.  Thank you very much for  allowing [name] the opportunity to participate in the care of Ms. Deeann Saint.       SW/MEDQ  D:  11/26/2004  T:  11/26/2004  Job:  [id number]

## 2010-11-02 NOTE — H&P (Signed)
Krystal Craig, Krystal Craig              ACCOUNT NO.:  [id number]   MEDICAL RECORD NO.:  [id number]          PATIENT TYPE:  INP   LOCATION:  NA                           FACILITY:  [facility]   PHYSICIAN:  Ines Bloomer, M.D. DATE OF BIRTH:  09-08-1932   DATE OF ADMISSION:  11/20/2004  DATE OF DISCHARGE:                                HISTORY & PHYSICAL   CHIEF COMPLAINT:  Left lower lobe mass.   HISTORY OF PRESENT ILLNESS:  This female patient quit smoking in 1997 and  continues to work Audiological scientist estate.  A chest x-ray showed a lesion in the left  lower lobe.  CT scan showed a 1.7 x 2.8 left lower lobe lesion that was  noted on previous chest x-ray.  Her pulmonary function tests showed FVC of  2.79 and FEV1 of 2.02.  She has had no hemoptysis, fever, chills, or  excessive sputum.  PET scan was done which was borderline positive with an  SUV of 2.6.  It was felt this lesion could be a possible lower-grade cancer  such as a bronchoalveolar cancer or inflammatory situation, or lesion such  as granulomatous disease.   PAST MEDICAL HISTORY:  Significant for hypertension and gastroesophageal  reflux.  She developed a partial gastric outlet obstruction and had to have  surgery.  She had irritable bowel and has been treated for irritable bowel  syndrome.  She also had colonic polyp followed with colonoscopy.  She also  gives a history of atrial palpitations as well as giving history of  bronchitis, allergic rhinitis, degenerative arthritis, and has had bilateral  shoulder surgeries.  Other surgeries include a cholecystectomy,  appendectomy, and partial hysterectomy.   MEDICATIONS:  Zocor 20 mg a day.   ALLERGIES:  CODEINE.   FAMILY HISTORY:  Positive for vascular disease, negative for cancer.   SOCIAL HISTORY:  She is married and has two children.  She works in Licensed conveyancer.  Does not smoke, drinks occasionally.   REVIEW OF SYSTEMS:  She is 147 pounds, 5 feet 3 inches.  She has had no  change  in her weight.  CARDIAC:  No angina.  Palpitations as mentioned.  PULMONARY:  As in History of Present Illness.  GI:  As in Past Medical  History with irritable bowel syndrome, intermittent diarrhea, and  constipation.  GU:  No history of dysuria, kidney stones, or frequent  urination.  VASCULAR:  No history of claudication, TIAs, or DVT.  NEUROLOGIC:  No history of headaches, blackouts, or seizures.  ORTHOPEDIC:  Chronic joint pain and degenerative arthritis and as in Past Medical  History.  PSYCHIATRIC:  No psychiatric illnesses.  EYES AND ENT:  No change  in eyesight or hearing.  IMMUNOLOGICAL:  No history.   PHYSICAL EXAMINATION:  GENERAL:  She is a well-developed,  Caucasian female  in no acute distress.  VITAL SIGNS:  Blood pressure 140/76, pulse 84, respirations 18, O2  saturation 97%.  HEAD, EYES, EARS, NOSE, AND THROAT:  Head is atraumatic.  Eyes: Pupils  equal, round, and reactive to light and accommodation.  Extraocular muscles  are intact.  Ears:  Tympanic membranes are intact.  Nares:  There is no  septal deviation or rhinitis. Mouth is without lesion.  Uvula is midline;  tongue is midline.  NECK:  No supraclavicular or axillary adenopathy, no thyromegaly, no carotid  bruits.  CHEST:  Clear to auscultation.  HEART:  Regular sinus rhythm, no murmurs.  ABDOMEN:  Soft. There are multiple surgical scars.  Bowel sounds are normal,  and there is no tenderness.  EXTREMITIES:  Pulses are 2+.  There is no clubbing or edema.  NEUROLOGIC:  Oriented x 3.  Sensory and motor intact.  Cranial nerves II-XII  intact.  Deep tendon reflexes are 2+.   IMPRESSION:  1.  Left lower lobe mass.  2.  Irritable bowel syndrome.  3.  Degenerative arthritis.  4.  Colon polyps.  5.  Hypercholesterolemia.  6.  Gastroesophageal reflux.   PLAN:  Left VATS, wedge resection of left lower lobe lesion or possible left  lower lobectomy.      DPB/MEDQ  D:  11/17/2004  T:  11/18/2004  Job:  [id number]

## 2010-11-02 NOTE — Op Note (Signed)
Krystal Craig, MCINNIS              ACCOUNT NO.:  [id number]   MEDICAL RECORD NO.:  [id number]          PATIENT TYPE:  INP   LOCATION:  2899                         FACILITY:  [facility]   PHYSICIAN:  Ines Bloomer, M.D. DATE OF BIRTH:  10/01/1932   DATE OF PROCEDURE:  DATE OF DISCHARGE:                                 OPERATIVE REPORT   PREOPERATIVE DIAGNOSIS:  Left lower lobe mass.   POSTOPERATIVE DIAGNOSIS:  Nonsmall cell lung cancer, left lower lobe.   OPERATION PERFORMED:  Left video assisted thoracoscopic surgery, left lower  lobectomy, mini-thoracotomy.   SURGEON:  Ines Bloomer, M.D.   ASSISTANT:  Regenia Skeeter, RN, FA.   ANESTHESIA:  General.   DESCRIPTION OF PROCEDURE:  After general anesthesia, the patient was turned  to the left lateral thoracotomy position, was prepped and draped in the  usual sterile manner.  A dual lumen tube was inserted and two trocar sites  were made in the anterior and posterior axillary line at the seventh  intercostal space.  Two trocars were inserted.  A 0 degree scope was  inserted and the patient's lesion was seen in the superior portion of the  lateral basilar segment coming in through the third __________ the sixth  intercostal space and about a 4 or 5 cm incision.  The serratus was split  and the intercostal muscle was opened and a small retractor was inserted to  spread the tissues with minimal spreading of the ribs.  Then using the scope  for visualization, the lesion was located and grasped through the mini  incision with a Duval lung clamp and then coming in medially with EZ-45  stapler and then laterally with the EZ-45 stapler through the trocar sites.  The lesion was resected and sent for frozen section which revealed a  nonsmall cell lung cancer.  The inferior pulmonary ligament was then taken  down and the inferior pulmonary vein was looped with a vascular tape,  stapled with an Autosuture 30 white Roticulator and divided.   Then attention  was turned to the fissure.  The fissure was partially divided with the EZ-45  stapler and dissecting out the inferior pulmonary vein, 8 and 10 L nodes  were dissected free and dissecting out the fissure, multiple 11 L nodes were  dissected free from the pulmonary artery and around the bronchus as well as  several 10L nodes close to the bronchus.  The pulmonary artery was  identified in the fissure and the superior portion of the fissure was  dissected out, stapled and divided with the EZ-45 stapler and the inferior  portion of the fissure was stapled and divided with the EZ-45 stapler  dissecting the 10L node off the bronchus.  Then the basilar branches to the  left lower lobe were stapled and divided with the Autosuture stapler.  The  two small superior segmental branches, one was ligated with 2-0 silk and  clipped distally and the other was clipped multiple times with the  Autosuture, the 5 mm stapler and divided.  This freed up the pulmonary  artery.  Then the bronchus  was dissected free and stapled and divided with  the EZ-45 stapler.  The lung was then placed in the bag and removed and  removed through the access incision.  A Marcaine block was done in the usual  fashion.  The area was checked for leaks and there was a small leak in the  staple line and this was sealed with CoSeal.  Two chest tubes were brought  into the trocar sites and tied in place with 0 silk.  The On-Q catheter was  placed subpleurally using the scope behind in the subpleural position,  paravertebral position.  It was then  connected to the On-Q ball and the catheter was sutured in place with 3-0  silk.  The chest was closed with two pericostals of #1 Vicryl in the muscle  layer, 2-0 Vicryl in the subcutaneous tissue and Dermabond for the skin.  The patient was then transferred to the recovery room in stable condition.      DPB/MEDQ  D:  11/20/2004  T:  11/20/2004  Job:  [id number]   cc:   Lonzo Cloud. Kriste Basque, M.D. [facility]

## 2010-11-02 NOTE — Op Note (Signed)
Krystal Craig. [facility]  Patient:    Krystal Craig                     MRN: [id number] Proc. Date: 12/27/99 Adm. Date:  [id number] Attending:  Drema Craig CC:         Krystal Craig, M.D., chiropracter                           Operative Report  PREOPERATIVE DIAGNOSIS:  Right shoulder impingement syndrome. Acromioclavicular joint arthritis.  Rule out rotator cuff tear.  POSTOPERATIVE DIAGNOSIS:  Right shoulder impingement syndrome. Acromioclavicular joint arthritis. No rotator cuff tear.  PROCEDURE: 1. Right shoulder operative arthroscopy with subacromial arch decompression and    acromioplasty. 2. Subdeltoid bursectomy. 3. Light debridement partial thickness rotator cuff tear. 4. Distal clavicle resection.  SURGEON:  Krystal Craig, M.D.  ASSISTANTS:  Krystal Craig. Krystal Craig, P.A. and Krystal Craig, P.A.-S.  ANESTHESIA:  General endotracheal anesthesia.  CULTURES:  None.  DRAINS:  None.  ESTIMATED BLOOD LOSS:  Minimal.  PATHOLOGIC FINDINGS AND INDICATIONS:  Krystal Craig is a pleasant 77 year old female referred by Krystal Craig, M.D. for problems with both of her shoulders. Examination was consistent with bilateral impingement syndromes and AC joint arthritis.  X-rays showed type II to III acromions bilaterally and we injected oth AC joints with cortisone Marcaine and both undersurfaces acromions with 1 to 3 cortisone Marcaine.  We placed her on Celebrex, bilateral [place] 4 + 2 exercises and saw her back.  By Oct 26, 1999, she said the cortisone shots relieved her pain completely for several weeks.  The pain has returned mostly over her AC joints nd we reinjected them with cortisone Marcaine.  She came back on November 21, 1999, very pleased with the results.  The shots helped with playing golf over the previous  weekend.  She then came back on December 17, 1999, with both shoulders hurting again, unable to reach overhead, subacromial  tenderness, AC joint tenderness, and mild  rotator cuff weakness.  At this point she was ready to do something.  We talked  about arthroscopic intervention on the right side, she thought she would probably have to go on the left side soon also.  At surgery today, she had slight fraying of the superior labrum.  The biceps was intact.  Undersurface of the rotator cuff as injected.  It was, however, intact.  She had a sharp anterior acromion. Obvious arthritic distal clavicle with ragged AC meniscus.  She did not have a through-and-through tear, but she had fraying in the critical zone.  She had acromioplasty and distal clavicle resection and bursectomy and light debridement on the rotator cuff are with the ablator on low used to smooth the area.  DESCRIPTION OF PROCEDURE:  With adequate anesthesia obtained after block given preoperatively, scaline block for postoperative pain management, general anesthesia was obtained with endotracheal anesthesia.  The patient was placed in the supine beach chair position.  The right shoulder was prepped and draped in the standard fashion.  After standard prepping and draping, a posterior entry portal was made in the shoulder.  This was after we had drawn markings for anatomic positioning and injected 20 cc of 0.5% Marcaine with epinephrine in the subacromial space to open it up.  The anterior portal was then established just lateral to the corocoid and the joint was probed.  Light shaving carried out on the superior labrum.  We shaved some areas of the undersurface rotator cuff where there was some frons of synovium. Biceps was intact.  Glenohumeral surfaces looked good.  Portals were reversed and similar shavings carried out.  We then entered the subacromial space from the posterior portal.  Anterolateral portal was established.  We brought in a shaver, removed bursa from the anterior undersurface of the acromion and shaved  soft tissue from the anterior under surface of the acromion.  Ablator was used to further cauterize the area.  A 6.0 bur was then brought in and acromioplasty carried out to the roof of the subacromial space in the manner of Caspari.  I then turned the scope sideways and through the anterior portal, debrided the [facility] meniscus with the basket and shaver and did distal clavicle resection two shaverbreadths in. Bleeding points were cauterized and the surrounding ligament was smooth with an  ablator.  I then moved to the anterior lateral portal with the scope, trimmed the acromioplasty back to the bicortical bone in the manner of Caspari.  I then shaved the bursa off the rotator cuff with internal external rotation and abduction.  then lightly shaved on the area of atritional change in the rotator cuff where he had been pinching in the supraspinatous and then lightly ablated this area. When I was satisfied with the resection, the shoulders were irrigated through the scope. 0.5% Marcaine with epinephrine was injected in and about the portals.  The anterior portal was closed with 2-0 Vicryl suture.  The other ones were left open.  A bulky sterile compressive dressing was applied with a sling.  The patient having tolerated the procedure well was awakened and taken to the recovery room in satisfactory condition to be discharged per outpatient routine.  She was told to call the office for appointment for recheck tomorrow for dressing change and given Tylox for pain. DD:  12/27/99 TD:  12/27/99 Job: [id number]

## 2010-11-02 NOTE — Discharge Summary (Signed)
Krystal Craig, Krystal Craig NO.:  [id number]   MEDICAL RECORD NO.:  [id number]          PATIENT TYPE:  INP   LOCATION:  2025                         FACILITY:  [facility]   PHYSICIAN:  Ines Bloomer, M.D. 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  A recent  chest x-ray showed a lesion in the left lower lobe.  Due to this finding,  other subsequent testing was done to further delineate this finding by CT  and PET scans.  The CT scan showed a 1.7 x 2.8 left lower lobe lesion and  the PET scan showed a borderline positive study with a SUV of 2.6.  These  findings were felt to show evidence of a possible low grade carcinoma versus  inflammatory condition.  She was referred to Dr. Edwyna Shell and he recommended  resection.  The patient did undergo office spirometry and this showed her  FVC of 2.79 and her FEV-1 of 2.02.   PAST MEDICAL HISTORY:  Significant for hypertension and gastroesophageal  reflux.   PAST SURGICAL HISTORY:  Abdominal surgery for partial gastric outlet  obstruction.  She has also a history of irritable bowel syndrome.  She has a  history of colon polyps followed with colonoscopy.  She also gives a history  of atrial dysrhythmias, as well as bronchitis, allergic rhinitis,  degenerative arthritis, bilateral shoulder surgeries, cholecystectomy,  appendectomy and partial hysterectomy.   MEDICATIONS ON ADMISSION:  Zocor 20 mg daily.   ALLERGIES:  CODEINE.   FAMILY HISTORY:  Positive for vascular disease and negative for cancer.   SOCIAL HISTORY:  She is married and has two children.  She works in [employer].  She does not smoke.  She drinks occasionally.   FAMILY HISTORY, SOCIAL HISTORY, REVIEW OF SYSTEMS AND PHYSICAL  EXAMINATION:  Please see the history and physical done at the time of admission.   HOSPITAL COURSE:  The patient was admitted electively on November 20, 2004 and  taken to the operating room, where she underwent the following procedure:  Left video-assisted thoracoscopic surgery, left lower lobectomy, mini-  thoracotomy.  The patient tolerated the procedure well, was taken to the  post anesthesia care unit in stable condition.   POSTOPERATIVE HOSPITAL COURSE:  The patient has done quite well.  She has  remained hemodynamically stable.  She does have a moderate postoperative  anemia.  Her most recent hemoglobin and hematocrit is stable on November 23, 2004  at 10 and 30 respectively.  Electrolytes, BUN and creatinine are within  normal limits.  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  It  measured 1.5 cm.  Visceral pleura was negative for tumor.  The parenchymal  resection margin was negative for tumor.  All lymph nodes at multiple  levels  including 9L, 11L, 10L were negative, as was a 12L lymph node, all negative  for tumor.  The left lower lobe lung resection showed the bronchiolar and  vascular resection margins negative for tumor.  Oncology consultation was  obtained with Dr. Arbutus Ped.  The patient will be followed at the [facility] for potential further treatment, although it is felt that most  likely she will not require adjuvant therapy.  The patient will be continued  with sequential follow up CT scans.  The patient did have a discussion on  the potential selenium versus placebo trial, which she appears to be  interested in.  She will follow up as an outpatient.  Currently her incision  is healing well without evidence of infection.  She is tolerating routine  activities commensurate for level of postoperative convalescence.  Overall  doing quite well and is felt to be tentatively stable for  discharge on the  morning of November 24, 2004 pending morning round reevaluation.   MEDICATIONS ON DISCHARGE:  1.  Zocor 20 mg daily.  2.  For pain Tylox one to two every four to six hours as needed.   INSTRUCTIONS:  The patient received written instructions in regard to  medications, activity, diet, wound care, and followup.   FOLLOWUP:  Followup will include Dr. Edwyna Shell in one week with a chest x-ray  from [facility].  Other followup will be arranged for the patient to  see the oncologist at the [facility].   FINAL DIAGNOSIS:  Stage Ia left lung carcinoma as described.   OTHER DIAGNOSES:  As previously listed per the history; mild postoperative  anemia.      [name]   WEG/MEDQ  D:  11/23/2004  T:  11/24/2004  Job:  [id number]   cc:   Lajuana Matte, MD  Fax: [phone]   Lonzo Cloud. Kriste Basque, M.D. [facility]

## 2011-03-28 LAB — BASIC METABOLIC PANEL
BUN: 17
CO2: 27
Calcium: 9.4
GFR calc non Af Amer: >60
Glucose, Bld: 99
Potassium: 4

## 2011-03-28 LAB — URINALYSIS, ROUTINE W REFLEX MICROSCOPIC
Bilirubin Urine: NEGATIVE
Nitrite: NEGATIVE
Protein, ur: NEGATIVE
Specific Gravity, Urine: 1.007
Urobilinogen, UA: 0.2

## 2011-03-28 LAB — CBC
HCT: 37.2
Hemoglobin: 12.7
MCHC: 34.3
Platelets: 285
RDW: 13.4

## 2011-03-28 LAB — URINE MICROSCOPIC-ADD ON

## 2011-05-13 ENCOUNTER — Telehealth: Payer: Self-pay | Admitting: Pulmonary Disease

## 2011-05-13 NOTE — Telephone Encounter (Signed)
I spoke with pt and she is requesting to either come in and see SN or TP. Pt states she is under a lot of stress and is having a lot of anxiety. Pt wants to come in and discuss this. Pt is scheduled to come in and see TP on 11/28 at 9:15

## 2011-05-15 ENCOUNTER — Ambulatory Visit: Payer: Medicare Other | Admitting: Adult Health

## 2011-07-25 ENCOUNTER — Telehealth: Payer: Self-pay | Admitting: Pulmonary Disease

## 2011-07-25 DIAGNOSIS — M949 Disorder of cartilage, unspecified: Secondary | ICD-10-CM

## 2011-07-25 DIAGNOSIS — F419 Anxiety disorder, unspecified: Secondary | ICD-10-CM

## 2011-07-25 DIAGNOSIS — Z8679 Personal history of other diseases of the circulatory system: Secondary | ICD-10-CM

## 2011-07-25 DIAGNOSIS — E7800 Pure hypercholesterolemia, unspecified: Secondary | ICD-10-CM

## 2011-07-25 DIAGNOSIS — M899 Disorder of bone, unspecified: Secondary | ICD-10-CM

## 2011-07-25 DIAGNOSIS — K5730 Diverticulosis of large intestine without perforation or abscess without bleeding: Secondary | ICD-10-CM

## 2011-07-25 DIAGNOSIS — N390 Urinary tract infection, site not specified: Secondary | ICD-10-CM

## 2011-07-25 NOTE — Telephone Encounter (Signed)
Called and spoke with pt and she is aware of labs that are in the computer for her on Monday.

## 2011-07-29 ENCOUNTER — Other Ambulatory Visit (INDEPENDENT_AMBULATORY_CARE_PROVIDER_SITE_OTHER): Payer: Medicare Other

## 2011-07-29 ENCOUNTER — Other Ambulatory Visit: Payer: Self-pay | Admitting: Pulmonary Disease

## 2011-07-29 DIAGNOSIS — F411 Generalized anxiety disorder: Secondary | ICD-10-CM

## 2011-07-29 DIAGNOSIS — E7800 Pure hypercholesterolemia, unspecified: Secondary | ICD-10-CM

## 2011-07-29 DIAGNOSIS — M949 Disorder of cartilage, unspecified: Secondary | ICD-10-CM

## 2011-07-29 DIAGNOSIS — F419 Anxiety disorder, unspecified: Secondary | ICD-10-CM

## 2011-07-29 DIAGNOSIS — Z8679 Personal history of other diseases of the circulatory system: Secondary | ICD-10-CM

## 2011-07-29 DIAGNOSIS — M899 Disorder of bone, unspecified: Secondary | ICD-10-CM

## 2011-07-29 DIAGNOSIS — N390 Urinary tract infection, site not specified: Secondary | ICD-10-CM

## 2011-07-29 LAB — URINALYSIS, ROUTINE W REFLEX MICROSCOPIC
Bilirubin Urine: NEGATIVE
Ketones, ur: NEGATIVE
Leukocytes, UA: NEGATIVE
Specific Gravity, Urine: 1.01 (ref 1.000–1.030)
Total Protein, Urine: NEGATIVE
Urine Glucose: NEGATIVE
pH: 7 (ref 5.0–8.0)

## 2011-07-29 LAB — BASIC METABOLIC PANEL
CO2: 29 mEq/L (ref 19–32)
Calcium: 9.7 mg/dL (ref 8.4–10.5)
Creatinine, Ser: 0.8 mg/dL (ref 0.4–1.2)
Glucose, Bld: 89 mg/dL (ref 70–99)

## 2011-07-29 LAB — CBC WITH DIFFERENTIAL/PLATELET
Basophils Relative: 0.4 % (ref 0.0–3.0)
Eosinophils Absolute: 0.4 10*3/uL (ref 0.0–0.7)
Eosinophils Relative: 6.7 % — ABNORMAL HIGH (ref 0.0–5.0)
HCT: 38.8 % (ref 36.0–46.0)
Hemoglobin: 13.2 g/dL (ref 12.0–15.0)
Lymphs Abs: 1.4 10*3/uL (ref 0.7–4.0)
MCHC: 33.9 g/dL (ref 30.0–36.0)
MCV: 87.9 fl (ref 78.0–100.0)
Monocytes Absolute: 0.5 10*3/uL (ref 0.1–1.0)
Neutro Abs: 4.2 10*3/uL (ref 1.4–7.7)
Neutrophils Relative %: 63.8 % (ref 43.0–77.0)
RBC: 4.42 Mil/uL (ref 3.87–5.11)
WBC: 6.6 10*3/uL (ref 4.5–10.5)

## 2011-07-29 LAB — LIPID PANEL
Cholesterol: 184 mg/dL (ref 0–200)
HDL: 56.2 mg/dL (ref >39.00)
Total CHOL/HDL Ratio: 3
Triglycerides: 212 mg/dL — ABNORMAL HIGH (ref 0.0–149.0)

## 2011-07-29 LAB — LDL CHOLESTEROL, DIRECT: Direct LDL: 100.3 mg/dL

## 2011-07-29 LAB — HEPATIC FUNCTION PANEL
Albumin: 4 g/dL (ref 3.5–5.2)
Total Protein: 7.2 g/dL (ref 6.0–8.3)

## 2011-07-30 LAB — VITAMIN D 25 HYDROXY (VIT D DEFICIENCY, FRACTURES): Vit D, 25-Hydroxy: 54 ng/mL (ref 30–89)

## 2011-08-01 ENCOUNTER — Ambulatory Visit (INDEPENDENT_AMBULATORY_CARE_PROVIDER_SITE_OTHER)
Admission: RE | Admit: 2011-08-01 | Discharge: 2011-08-01 | Disposition: A | Discharge status: Home / Self Care | Payer: Medicare Other | Source: Ambulatory Visit | Attending: Pulmonary Disease | Admitting: Pulmonary Disease

## 2011-08-01 ENCOUNTER — Ambulatory Visit (INDEPENDENT_AMBULATORY_CARE_PROVIDER_SITE_OTHER): Payer: Medicare Other | Admitting: Pulmonary Disease

## 2011-08-01 ENCOUNTER — Encounter: Payer: Self-pay | Admitting: Pulmonary Disease

## 2011-08-01 VITALS — BP 130/60 | HR 84 | Temp 97.1°F | Ht 63.5 in | Wt 147.8 lb

## 2011-08-01 DIAGNOSIS — M545 Low back pain, unspecified: Secondary | ICD-10-CM

## 2011-08-01 DIAGNOSIS — K219 Gastro-esophageal reflux disease without esophagitis: Secondary | ICD-10-CM

## 2011-08-01 DIAGNOSIS — N390 Urinary tract infection, site not specified: Secondary | ICD-10-CM

## 2011-08-01 DIAGNOSIS — C3490 Malignant neoplasm of unspecified part of unspecified bronchus or lung: Secondary | ICD-10-CM

## 2011-08-01 DIAGNOSIS — K5730 Diverticulosis of large intestine without perforation or abscess without bleeding: Secondary | ICD-10-CM

## 2011-08-01 DIAGNOSIS — K589 Irritable bowel syndrome without diarrhea: Secondary | ICD-10-CM

## 2011-08-01 DIAGNOSIS — M899 Disorder of bone, unspecified: Secondary | ICD-10-CM

## 2011-08-01 DIAGNOSIS — M949 Disorder of cartilage, unspecified: Secondary | ICD-10-CM

## 2011-08-01 DIAGNOSIS — E7800 Pure hypercholesterolemia, unspecified: Secondary | ICD-10-CM

## 2011-08-01 DIAGNOSIS — M1990 Unspecified osteoarthritis, unspecified site: Secondary | ICD-10-CM

## 2011-08-01 DIAGNOSIS — K449 Diaphragmatic hernia without obstruction or gangrene: Secondary | ICD-10-CM

## 2011-08-01 MED ORDER — TRAMADOL HCL 50 MG PO TABS: 50.0000 mg | ORAL_TABLET | Freq: Four times a day (QID) | ORAL | Status: DC | PRN

## 2011-08-01 MED ORDER — SIMVASTATIN 40 MG PO TABS: 40.0000 mg | ORAL_TABLET | Freq: Every day | ORAL | Status: DC

## 2011-08-01 NOTE — Patient Instructions (Signed)
Today we updated your med list in our EPIC system...    Continue your current medications the same...    We reflled your meds per request...  Marland KitchenToday we did your follow up CXR...    Please call the PHONE TREE in a few days for your results...    Dial [id number] & when prompted enter your patient number followed by the # symbol...    Your patient number is:  [id number]#  Call for any questions...  Stay as active as possible...  Let's continue our yearly check-ups, sooner if needed for any reason.Marland KitchenMarland Kitchen

## 2011-08-01 NOTE — Progress Notes (Addendum)
Subjective:     Patient ID: Krystal Craig, female   DOB: 1933-02-03, 78 y.o.   MRN: [id number]  HPI 78 y/o WF here for a follow up visit... she has multiple medical problems as noted below...   ~  August 11, 2009:  she has had a good year overall... still sees DrBurney for f/u LLL bronchoalveolar cell ca surg 2006- had CXR 3/10 & CT Chest 10/10 w/o new lesions seen... she had a purplexing "burning tongue" problems and was seen by dentists at [facility] who rec she stop her Boniva> she thinks symptoms improved off the Bisphos & doesn't want to take these meds... she had her 2010 flu vaccine.  ~  July 31, 2010:  Yearly ROV & still notes the burning tongueprob even w/o the Bisphos rx, pt notes that Prilosec makes it worse;  also c/o recent URI- allergy symptoms sneezing, sinus HA, etc> we discussed Rx w/ Antihist in AM, Saline nasal mist Q1-2H, Flonase Qhs; she recently had bilart cat surg.     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    She remains stable on Simva40 for chol;  GI is stable & she had her last colon 3/05 (tortuous, divertics, no polyps);  DJD & LBP w/ chiropractor rx;  as noted she is off Bisphos meds w/ osteopenia on BMD followed by GYN & [facility]...  ~  August 01, 2011:  Yearly ROV & review> Krystal Craig states that she is doing well, hjad a good yr, no new complaints or concerns...     AR, Hx bronchitis> she uses OTC antihist etc; denies recurrent bronchitic infections w/o cough, sput, hemop, SOB, etc...    Hx Lung Cancer> s/p LLLobectomy 2006 for Bronchoalveolar cell ca; followed up w/ DrBurney Q52mo for 20yrs (see below); now rec CXR yearly & CTChest QoYear...    Hx Palpit> On ASA81; she notes rare palpit, self limited- no CP, dizziness,  syncope,etc...    Hyperlipid> On Simva40;  FLP showed TChol 184, TG 212, HDL 56, LDL 100; advised better low fat diet...    HH, GERD> On Prilosec OTC as needed & states she hasn't needed it; denies heartburn, reflux, dysphagia, abd pain, n/v, etc...    Divertics, IBS> she denies abd discomfort, d/c/ blood seen, etc; she will be due for 21yr f/u colon 3/15    Hx recurrent UTIs> none since DrPeterson did her cystocele, rectocele, & sling surg 10/08; followed by Susy Frizzle now...    DJD, LBP> On Tramadol prn; she manages well, get plenty of exercise, still sees Chiropractor Qmo...    Osteopenia> On Calcium, MVI, VitD; BMDs per gyn- "DrMody said to just watch it"... CXR 2/13 showed heart size at upper limit, calcif tort Ao, s/p surg on left w/ hyperinflation, scarring left base, NAD... LABS 2/13:  FLP- at goals on Simva40 x TG212;  Chems- wnl;  CBC- wnl;  TSH=2.06;  VitD=54;  UA- clear   PROBLEM LIST:    ALLERGIC RHINITIS (ICD-477.9) - she uses OTC Zyrtek Prn> advised sinus regimen w/ Antihist Qam, Nasal saline mist every 1-2H during the day, Mucinex 1-2 Bid w/ fluids, & Flonase Qhs...  Hx of BRONCHITIS (ICD-490) - no recent URI's or bronchitic infections... she  denies cough, sputum, hemoptysis, worsening dyspnea,  wheezing, chest pains, snoring, daytime hypersomnolence, etc...   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 underwent LLLobectomy via VATS & minithoracotomy by DrBurney 6/06 w/ 1.5cm BRONCHOALVEOLAR CELL CANCER & neg nodes... she saw DrMohammed post-op, no adjuvant therapy recommended... she's been followed regularly by DrBurney since surg Q65mo w/ CXR, alt w/ CT scans... ~  CXR 3/09 by DrBurney- no evid of recurrence, left pleural thickening, otherw neg... ~  CT Chest 9/09 showed post-op changes, precarinal LN w/o change, 3mm nodule ant left lung w/o change, scar RML w/o change, thoracic spondy- NAD.Marland Kitchen. ~  CXR 3/10 showed vol loss on left w/  blunt left angle, NAD. ~  CT Chest 10/10 showed clear lungs w/o nodules or infiltrates, stable precarinal LN. ~  CXR 4/11 revealed post op changes, NAD.Marland KitchenMarland Kitchen ~  CT Chest 11/11 showed postop changes, NAD.Marland Kitchen. ~  CXR 2/13 showed heart size at upper limit, calcif tort Ao, s/p surg on left w/ hyperinflation, scarring left base, NAD...   PALPITATIONS, HX OF (ICD-V12.50) - she takes ASA 81mg /d... notes rare palpit, no CP/ syncope/ dyspnea, edema, etc... ~  NuclearStressTest 12/05 was neg- no scar or ischemia, EF= 70%...  HYPERCHOLESTEROLEMIA (ICD-272.0) - on SIMVASTATIN 40mg /d + low fat diet... ~  FLP 11/07 showed TChol 181, TG -, HDL 48, LDL 95 ~  FLP 1/09 showed TChol 162, TG 198, HDL 43, LDL 80 ~  FLP 2/10 showed TChol 132, TG 144, HDL 41, LDL 62 ~  FLP 2/11 showed TChol 167, TG 271, HDL 57, LDL 90... rec> better low fat diet. ~  FLP 2/12 showed TChol 173, TG 165, HDL 56, LDL 84 ~  FLP 2/13 on Simva40 showed TChol 184, TG 212, HDL 56, LDL 100   HIATAL HERNIA (ICD-553.3), & GERD (ICD-530.81) - she states she is doing fine and does not require PPI therapy... 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 ~  2/11: she notes using occas Mylanta & advised to use Prilosec Prn.  DIVERTICULOSIS OF COLON (ICD-562.10), & IRRITABLE BOWEL SYNDROME (ICD-564.1) - last colonoscopy 3/05 by DrSamLeB showed tortuous colon, divertics, otherw neg...  Hx of UTI'S, RECURRENT (ICD-599.0) - she had recurrent UTI's leading DrGottsegen to refer her to DrPeterson for surg w/ cystocele/ rectocele repairs & sling 10/08... ~  She denies recurrent UTIs & is now followed by Susy Frizzle...  OSTEOARTHRITIS (ICD-715.90) - she has had trigger fingers and several injections by DrSypher... ~  s/p bilat shoulder operations for impingement syndromes in 2001 by DrPaul... ~  She takes TRAMADOL 50mg  prn pain...  BACK PAIN, LUMBAR (ICD-724.2) - she still sees her chiropractor  every month for LBP adjustments...  OSTEOPENIA (ICD-733.90) - she states that BMD's are done by DrGottsegen et al and she was prev on Boniva but stopped this in 2010 due to "burning tongue" & ultimately has dental consult at [facility] w/ rec to stop the Bisphos Rx (symptoms did abate somewhat)... she takes Calcium, MVI, VitD, & DrMody has her on Premarin Vag cream weekly... ~  BMD at [facility] 5/08 showed TScores +0.1 in Spine, and -2.0 in left FemNeck (sl worse than 2006) ~  BMD in 8/10 at [facility] showed TScore ?Spine, and -1.9 in FemNeck (sl improved from 2008) ~  Osteopenia followed by GYN & they do her BMDs; she tells me that DrMody said to "just watch it" since she is  INTOL to Bisphos rx.  INSOMNIA (ICD-780.52) - prev on Ambien Prn but she indicates that she rests well now w/o need for meds...  Health Maintenance: ~  GYN:  she has a new GYN= DrMody on Premarin cream weekly...   Past Surgical History  Procedure Date  . Appendectomy   . Abdominal hysterectomy   . Cholecystectomy   . Bilateral shouler operations for impingement syndromes 2001    Dr. Renae Fickle  . Left lower lobectomy 11/2004    via VATS and mini-thoractomy for 1.5cm adenoca of lung by Dr. Edwyna Shell  . Urologic sugery with cystocele/rectocele repairs and sling 03/2007    Dr. Vonita Moss  . Bilateral cataract surgery 2012    Dr. Vonna Kotyk    Outpatient Encounter Prescriptions as of 08/01/2011  Medication Sig Dispense Refill  . Calcium Carbonate-Vitamin D (CALCIUM-D) 600-400 MG-UNIT TABS Take 1 tablet by mouth 2 (two) times daily.      Marland Kitchen conjugated estrogens (PREMARIN) vaginal cream Use cream once weekly      . Multiple Vitamins-Minerals (CENTRUM SILVER PO) Take 1 tablet by mouth daily.      . simvastatin (ZOCOR) 40 MG tablet TAKE 1 TABLET DAILY  90 tablet  2  . traMADol (ULTRAM) 50 MG tablet Take 50 mg by mouth every 6 (six) hours as needed.        Allergies  Allergen Reactions  . Codeine     Chest pain/tightness    Current  Medications, Allergies, Past Medical History, Past Surgical History, Family History, and Social History were reviewed in [employer] record.     Review of Systems         See HPI - all other systems neg except as noted...  The patient denies anorexia, fever, weight loss, weight gain, vision loss, decreased hearing, hoarseness, chest pain, syncope, dyspnea on exertion, peripheral edema, prolonged cough, headaches, hemoptysis, abdominal pain, melena, hematochezia, severe indigestion/heartburn, hematuria, incontinence, muscle weakness, suspicious skin lesions, transient blindness, difficulty walking, depression, unusual weight change, abnormal bleeding, enlarged lymph nodes, and angioedema.     Objective:   Physical Exam     WD, WN, 78 y/o WF in NAD... GENERAL:  Alert & oriented; pleasant & cooperative. HEENT:  [place]/AT, EOM-wnl, PERRLA, EACs-clear, TMs-wnl, NOSE-clear, THROAT-clear & wnl. NECK:  Supple w/ fairROM; no JVD; normal carotid impulses w/o bruits; no thyromegaly or nodules palpated; no lymphadenopathy. CHEST:  Clear to P & A; without wheezes/ rales/ or rhonchi; scar from prev surg on left. HEART:  Regular Rhythm; without murmurs/ rubs/ or gallops heard... ABDOMEN:  Soft & nontender; normal bowel sounds; no organomegaly or masses detected. EXT: without deformities, mild arthritic changes; no varicose veins/ venous insuffic/ or edema. NEURO:  CN's intact; motor testing normal; sensory testing normal; gait normal & balance OK. DERM:  No lesions noted; no rash etc...  RADIOLOGY DATA:  Reviewed in the EPIC EMR & discussed w/ the patient...    >>CXR 2/13 showed heart size at upper limit, calcif tort Ao, s/p surg on left w/ hyperinflation, scarring left base, NAD...  LABORATORY DATA:  Reviewed in the EPIC EMR & discussed w/ the patient...    >>LABS 2/13:  FLP- at goals on Simva40 x TG212;  Chems- wnl;  CBC- wnl;  TSH=2.06;  VitD=54;  UA- clear   Assessment:       AR/ Bronchitis>  She uses OTC aqntihist etc as needed; she denies intercurrent bronchitic infections, etc...  Hx Lung Cancer>  Hx as above; routine CXR is  neg- no evid for recurrent dis...  Palpit>  She notes intermittent self limited palpit & knows to avoid caffeine, pseudophed, etc...  Hyperlipid>  Chol numbers ok on the Simva40; needs better low dfat diet for the TGs...  GI> HH, GERD, Divertics, IBS>  Hx PUD surg yrs ago & she takes an occas Prilosec prn; denies IBS symptoms & says BMs are normal; f/u colon due 3/15...  Hx recurrent UTIs in the past; no prob since sling surg 2008...  DJD, LBP>  Doing satis on Tramadol & Chiropractor adjustments Qmo she says...  Osteopenia>  Followed by Susy Frizzle...     Plan:     Patient's Medications  New Prescriptions   No medications on file  Previous Medications   CALCIUM CARBONATE-VITAMIN D (CALCIUM-D) 600-400 MG-UNIT TABS    Take 1 tablet by mouth 2 (two) times daily.   CONJUGATED ESTROGENS (PREMARIN) VAGINAL CREAM    Use cream once weekly   MULTIPLE VITAMINS-MINERALS (CENTRUM SILVER PO)    Take 1 tablet by mouth daily.  Modified Medications   Modified Medication Previous Medication   SIMVASTATIN (ZOCOR) 40 MG TABLET simvastatin (ZOCOR) 40 MG tablet      Take 1 tablet (40 mg total) by mouth at bedtime.    TAKE 1 TABLET DAILY   TRAMADOL (ULTRAM) 50 MG TABLET traMADol (ULTRAM) 50 MG tablet      Take 1 tablet (50 mg total) by mouth every 6 (six) hours as needed for pain.    Take 50 mg by mouth every 6 (six) hours as needed.  Discontinued Medications   No medications on file

## 2011-09-12 ENCOUNTER — Telehealth: Payer: Self-pay | Admitting: Pulmonary Disease

## 2011-09-12 NOTE — Telephone Encounter (Signed)
Pt is requesting a 90 day supply for Tramadol be sent to [facility] with refills. She was given Rx for this medication when here for OV on 08/01/11. Pt states the new rx needs to include manufacturer name TEVA, DX code and BMN. Pls advise if this is okay.

## 2011-09-12 NOTE — Telephone Encounter (Signed)
rx has been written out by SN.  We will fax this rx for the tramadol per pts request to [facility].  lmom to make the pt aware.

## 2011-09-15 ENCOUNTER — Encounter: Payer: Self-pay | Admitting: Pulmonary Disease

## 2011-09-18 ENCOUNTER — Other Ambulatory Visit: Payer: Self-pay | Admitting: Pulmonary Disease

## 2011-09-18 MED ORDER — TRAMADOL HCL 50 MG PO TABS: 50.0000 mg | ORAL_TABLET | Freq: Four times a day (QID) | ORAL | Status: DC | PRN

## 2011-09-18 NOTE — Telephone Encounter (Signed)
[facility] SENT RX BACK UNABLE TO READ , RESENT RX .Marland KitchenMarland Kitchen PT AWARE ALSO

## 2011-09-24 ENCOUNTER — Other Ambulatory Visit: Payer: Self-pay

## 2011-09-24 ENCOUNTER — Encounter (HOSPITAL_COMMUNITY): Payer: Self-pay | Admitting: *Deleted

## 2011-09-24 ENCOUNTER — Observation Stay (HOSPITAL_COMMUNITY)
Admission: EM | Admit: 2011-09-24 | Discharge: 2011-09-25 | Disposition: A | Discharge status: Home / Self Care | Payer: Medicare Other | Attending: Internal Medicine | Admitting: Internal Medicine

## 2011-09-24 ENCOUNTER — Emergency Department (HOSPITAL_COMMUNITY): Payer: Medicare Other

## 2011-09-24 DIAGNOSIS — K219 Gastro-esophageal reflux disease without esophagitis: Secondary | ICD-10-CM | POA: Diagnosis present

## 2011-09-24 DIAGNOSIS — J9811 Atelectasis: Secondary | ICD-10-CM | POA: Diagnosis present

## 2011-09-24 DIAGNOSIS — Z85118 Personal history of other malignant neoplasm of bronchus and lung: Secondary | ICD-10-CM | POA: Insufficient documentation

## 2011-09-24 DIAGNOSIS — Z8249 Family history of ischemic heart disease and other diseases of the circulatory system: Secondary | ICD-10-CM | POA: Insufficient documentation

## 2011-09-24 DIAGNOSIS — R599 Enlarged lymph nodes, unspecified: Secondary | ICD-10-CM | POA: Insufficient documentation

## 2011-09-24 DIAGNOSIS — E785 Hyperlipidemia, unspecified: Secondary | ICD-10-CM | POA: Diagnosis present

## 2011-09-24 DIAGNOSIS — E7800 Pure hypercholesterolemia, unspecified: Secondary | ICD-10-CM | POA: Diagnosis present

## 2011-09-24 DIAGNOSIS — R079 Chest pain, unspecified: Principal | ICD-10-CM | POA: Diagnosis present

## 2011-09-24 DIAGNOSIS — R590 Localized enlarged lymph nodes: Secondary | ICD-10-CM | POA: Diagnosis present

## 2011-09-24 DIAGNOSIS — J9819 Other pulmonary collapse: Secondary | ICD-10-CM | POA: Insufficient documentation

## 2011-09-24 HISTORY — DX: Other complications of anesthesia, initial encounter: T88.59XA

## 2011-09-24 HISTORY — DX: Other specified postprocedural states: Z98.890

## 2011-09-24 HISTORY — DX: Other specified postprocedural states: R11.2

## 2011-09-24 HISTORY — DX: Adverse effect of unspecified anesthetic, initial encounter: T41.45XA

## 2011-09-24 LAB — CBC
HCT: 35.8 % — ABNORMAL LOW (ref 36.0–46.0)
Hemoglobin: 11.9 g/dL — ABNORMAL LOW (ref 12.0–15.0)
Hemoglobin: 12.2 g/dL (ref 12.0–15.0)
MCH: 29.5 pg (ref 26.0–34.0)
MCH: 29.1 pg (ref 26.0–34.0)
MCHC: 33.2 g/dL (ref 30.0–36.0)
MCV: 88.6 fL (ref 78.0–100.0)
MCV: 86.9 fL (ref 78.0–100.0)
Platelets: 242 K/uL (ref 150–400)
RBC: 4.04 MIL/uL (ref 3.87–5.11)
RBC: 4.19 MIL/uL (ref 3.87–5.11)
RDW: 13.6 % (ref 11.5–15.5)
WBC: 10.3 K/uL (ref 4.0–10.5)
WBC: 9.5 10*3/uL (ref 4.0–10.5)

## 2011-09-24 LAB — CREATININE, SERUM
Creatinine, Ser: 0.79 mg/dL (ref 0.50–1.10)
GFR calc Af Amer: >90 mL/min (ref >90)
GFR calc non Af Amer: 79 mL/min — ABNORMAL LOW (ref >90)

## 2011-09-24 LAB — BASIC METABOLIC PANEL WITH GFR
BUN: 14 mg/dL (ref 6–23)
CO2: 28 meq/L (ref 19–32)
Calcium: 9.9 mg/dL (ref 8.4–10.5)
Chloride: 101 meq/L (ref 96–112)
Creatinine, Ser: 0.93 mg/dL (ref 0.50–1.10)
GFR calc Af Amer: 67 mL/min — ABNORMAL LOW
GFR calc non Af Amer: 58 mL/min — ABNORMAL LOW
Glucose, Bld: 105 mg/dL — ABNORMAL HIGH (ref 70–99)
Potassium: 4.3 meq/L (ref 3.5–5.1)
Sodium: 138 meq/L (ref 135–145)

## 2011-09-24 LAB — DIFFERENTIAL
Basophils Absolute: 0 K/uL (ref 0.0–0.1)
Basophils Relative: 0 % (ref 0–1)
Eosinophils Absolute: 0.2 K/uL (ref 0.0–0.7)
Eosinophils Relative: 2 % (ref 0–5)
Lymphocytes Relative: 7 % — ABNORMAL LOW (ref 12–46)
Lymphs Abs: 0.7 K/uL (ref 0.7–4.0)
Monocytes Absolute: 0.9 K/uL (ref 0.1–1.0)
Monocytes Relative: 9 % (ref 3–12)
Neutro Abs: 8.6 K/uL — ABNORMAL HIGH (ref 1.7–7.7)
Neutrophils Relative %: 83 % — ABNORMAL HIGH (ref 43–77)

## 2011-09-24 LAB — PROTIME-INR
INR: 1.05 (ref 0.00–1.49)
Prothrombin Time: 13.9 s (ref 11.6–15.2)

## 2011-09-24 LAB — CARDIAC PANEL(CRET KIN+CKTOT+MB+TROPI)
CK, MB: 1.5 ng/mL (ref 0.3–4.0)
Troponin I: <0.3 ng/mL (ref <0.30)

## 2011-09-24 LAB — APTT: aPTT: 29 s (ref 24–37)

## 2011-09-24 MED ORDER — ACETAMINOPHEN 650 MG RE SUPP: 650.0000 mg | Freq: Four times a day (QID) | RECTAL | Status: DC | PRN

## 2011-09-24 MED ORDER — ACETAMINOPHEN 325 MG PO TABS
650.0000 mg | ORAL_TABLET | Freq: Four times a day (QID) | ORAL | Status: DC | PRN
  Administered 2011-09-24: 650 mg via ORAL

## 2011-09-24 MED ORDER — CALCIUM-D 600-400 MG-UNIT PO TABS: 1.0000 | ORAL_TABLET | Freq: Two times a day (BID) | ORAL | Status: DC

## 2011-09-24 MED ORDER — SODIUM CHLORIDE 0.9 % IJ SOLN: 3.0000 mL | Freq: Two times a day (BID) | INTRAMUSCULAR | Status: DC

## 2011-09-24 MED ORDER — TRAMADOL HCL 50 MG PO TABS
50.0000 mg | ORAL_TABLET | Freq: Once | ORAL | Status: AC
  Administered 2011-09-24: 50 mg via ORAL
  Filled 2011-09-24: qty 1

## 2011-09-24 MED ORDER — CALCIUM CARBONATE-VITAMIN D 500-200 MG-UNIT PO TABS
1.0000 | ORAL_TABLET | Freq: Two times a day (BID) | ORAL | Status: DC
  Administered 2011-09-24 – 2011-09-25 (×3): 1 via ORAL
  Filled 2011-09-24 (×4): qty 1

## 2011-09-24 MED ORDER — TRAMADOL HCL 50 MG PO TABS
50.0000 mg | ORAL_TABLET | Freq: Four times a day (QID) | ORAL | Status: DC | PRN
  Administered 2011-09-24 – 2011-09-25 (×3): 50 mg via ORAL
  Filled 2011-09-24 (×3): qty 1

## 2011-09-24 MED ORDER — ESTROGENS, CONJUGATED 0.625 MG/GM VA CREA
1.0000 | TOPICAL_CREAM | Freq: Every day | VAGINAL | Status: DC
  Filled 2011-09-24: qty 42.5

## 2011-09-24 MED ORDER — ASPIRIN EC 81 MG PO TBEC
81.0000 mg | DELAYED_RELEASE_TABLET | Freq: Every day | ORAL | Status: DC
  Administered 2011-09-25: 81 mg via ORAL
  Filled 2011-09-24 (×2): qty 1

## 2011-09-24 MED ORDER — MORPHINE SULFATE 2 MG/ML IJ SOLN
1.0000 mg | INTRAMUSCULAR | Status: DC | PRN
Start: 2011-09-24 — End: 2011-09-25

## 2011-09-24 MED ORDER — SODIUM CHLORIDE 0.9 % IV SOLN
INTRAVENOUS | Status: DC
  Administered 2011-09-24: 75 mL via INTRAVENOUS
  Administered 2011-09-24: 10:00:00 via INTRAVENOUS

## 2011-09-24 MED ORDER — PANTOPRAZOLE SODIUM 40 MG PO TBEC
40.0000 mg | DELAYED_RELEASE_TABLET | Freq: Every day | ORAL | Status: DC
  Administered 2011-09-24 – 2011-09-25 (×2): 40 mg via ORAL
  Filled 2011-09-24 (×2): qty 1

## 2011-09-24 MED ORDER — SIMVASTATIN 40 MG PO TABS
40.0000 mg | ORAL_TABLET | Freq: Every day | ORAL | Status: DC
  Administered 2011-09-24: 40 mg via ORAL
  Filled 2011-09-24 (×2): qty 1

## 2011-09-24 MED ORDER — ACETAMINOPHEN 325 MG PO TABS
ORAL_TABLET | ORAL | Status: AC
  Filled 2011-09-24: qty 2

## 2011-09-24 MED ORDER — ENOXAPARIN SODIUM 40 MG/0.4ML ~~LOC~~ SOLN
40.0000 mg | SUBCUTANEOUS | Status: DC
  Administered 2011-09-24: 40 mg via SUBCUTANEOUS
  Filled 2011-09-24 (×2): qty 0.4

## 2011-09-24 MED ORDER — NITROGLYCERIN 0.4 MG SL SUBL: 0.4000 mg | SUBLINGUAL_TABLET | SUBLINGUAL | Status: DC | PRN

## 2011-09-24 NOTE — Progress Notes (Signed)
Utilization Review Completed.Krystal Craig T4/02/2012   

## 2011-09-24 NOTE — ED Notes (Signed)
Patient arrives via EMS with c/o CP that radiates into her neck and shoulder.  This pain awakened her from her sleep.  Patient took 325mg  ASA at home prior to EMS arrival.  In route was given 2 NTG with minimal relief

## 2011-09-24 NOTE — Progress Notes (Signed)
Dr. Suanne Marker notified regarding elevated d-dimer.

## 2011-09-24 NOTE — ED Provider Notes (Signed)
History     CSN: [id number]  Arrival date & time 09/24/11  0230   First MD Initiated Contact with Patient 09/24/11 [phone]      Chief Complaint  Patient presents with  . Chest Pain    (Consider location/radiation/quality/duration/timing/severity/associated sxs/prior treatment) HPI Comments: Patient is a 26 her old female with history of hypercholesterol and a family history of heart disease who presents after acute onset of pressure-like chest pain that is in the mid upper chest, constant, improved over the last 2 hours, treated with aspirin 325 mg prior to arrival. She states that currently she is chest pain-free but she can make herself feel it by taking a deep breath. She denies trauma, travel, swelling of the legs, smoking or history of venous thromboembolism. Chest pain was acute in onset, gradually improved with nitroglycerin. States that both her mother and her brother had heart disease with both heart attacks and coronary blockages. She denies history of being evaluated for same  Patient is a 78 y.o. female presenting with chest pain. The history is provided by the patient and the spouse.  Chest Pain     Past Medical History  Diagnosis Date  . Allergic rhinitis   . History of bronchitis   . Malignant neoplasm of bronchus and lung, unspecified site   . Pure hypercholesterolemia   . Hiatal hernia   . GERD (gastroesophageal reflux disease)   . Diverticulosis of colon   . IBS (irritable bowel syndrome)   . Urinary tract infection, site not specified   . Osteoarthritis   . Lumbar back pain   . Osteopenia   . Insomnia     Past Surgical History  Procedure Date  . Appendectomy   . Abdominal hysterectomy   . Cholecystectomy   . Bilateral shouler operations for impingement syndromes 2001    Dr. Renae Fickle  . Left lower lobectomy 11/2004    via VATS and mini-thoractomy for 1.5cm adenoca of lung by Dr. Edwyna Shell  . Urologic sugery with cystocele/rectocele repairs and sling 03/2007   Dr. Vonita Moss  . Bilateral cataract surgery 2012    Dr. Vonna Kotyk    No family history on file.  History  Substance Use Topics  . Smoking status: Former Smoker    Types: Cigarettes    Quit date: 06/18/1995  . Smokeless tobacco: Not on file  . Alcohol Use: Not on file    OB History    Grav Para Term Preterm Abortions TAB SAB Ect Mult Living                  Review of Systems  Cardiovascular: Positive for chest pain.  All other systems reviewed and are negative.    Allergies  Codeine  Home Medications   Current Outpatient Rx  Name Route Sig Dispense Refill  . ASPIRIN 81 MG PO CHEW Oral Chew 324 mg by mouth once.    Marland Kitchen CALCIUM CARBONATE ANTACID 500 MG PO CHEW Oral Chew 3 tablets by mouth 3 (three) times daily as needed.    Marland Kitchen CALCIUM-D 600-400 MG-UNIT PO TABS Oral Take 1 tablet by mouth 2 (two) times daily.    Marland Kitchen ESTROGENS, CONJUGATED 0.625 MG/GM VA CREA  Use cream once weekly on Monday night    . CENTRUM SILVER PO Oral Take 1 tablet by mouth daily.    Marland Kitchen SIMVASTATIN 40 MG PO TABS Oral Take 1 tablet (40 mg total) by mouth at bedtime. 90 tablet 3  . TRAMADOL HCL 50 MG PO TABS Oral  Take 1 tablet (50 mg total) by mouth every 6 (six) hours as needed for pain. 270 tablet 3    Dispense as written.    [employer]  .Marland Kitchen.DAW .Marland KitchenMarland KitchenMarland KitchenDX  LOW BACK PAIN, LUNG CAN ...  . NITROGLYCERIN 0.4 MG SL SUBL Sublingual Place 0.4 mg under the tongue every 5 (five) minutes as needed. For chest pain - 2 doses      BP 119/49  Pulse 69  Temp(Src) 98.3 F (36.8 C) (Oral)  Resp 17  SpO2 99%  Physical Exam  Nursing note and vitals reviewed. Constitutional: She appears well-developed and well-nourished. No distress.  HENT:  Head: Normocephalic and atraumatic.  Mouth/Throat: Oropharynx is clear and moist. No oropharyngeal exudate.  Eyes: Conjunctivae and EOM are normal. Pupils are equal, round, and reactive to light. Right eye exhibits no discharge. Left eye exhibits no discharge. No scleral icterus.    Neck: Normal range of motion. Neck supple. No JVD present. No thyromegaly present.  Cardiovascular: Normal rate, regular rhythm, normal heart sounds and intact distal pulses.  Exam reveals no gallop and no friction rub.   No murmur heard. Pulmonary/Chest: Effort normal and breath sounds normal. No respiratory distress. She has no wheezes. She has no rales.  Abdominal: Soft. Bowel sounds are normal. She exhibits no distension and no mass. There is no tenderness.  Musculoskeletal: Normal range of motion. She exhibits no edema and no tenderness.  Lymphadenopathy:    She has no cervical adenopathy.  Neurological: She is alert. Coordination normal.  Skin: Skin is warm and dry. No rash noted. No erythema.  Psychiatric: She has a normal mood and affect. Her behavior is normal.    ED Course  Procedures (including critical care time)  ED ECG REPORT   Date: 09/24/2011   Rate: 71  Rhythm: normal sinus rhythm  QRS Axis: normal  Intervals: normal  ST/T Wave abnormalities: normal  Conduction Disutrbances:none  Narrative Interpretation:   Old EKG Reviewed: unchanged compared with 03/23/2007   Labs Reviewed  CBC - Abnormal; Notable for the following:    Hemoglobin 11.9 (*)    HCT 35.8 (*)    All other components within normal limits  DIFFERENTIAL - Abnormal; Notable for the following:    Neutrophils Relative 83 (*)    Neutro Abs 8.6 (*)    Lymphocytes Relative 7 (*)    All other components within normal limits  BASIC METABOLIC PANEL - Abnormal; Notable for the following:    Glucose, Bld 105 (*)    GFR calc non Af Amer 58 (*)    GFR calc Af Amer 67 (*)    All other components within normal limits  APTT  PROTIME-INR  POCT I-STAT TROPONIN I   Dg Chest Port 1 View  09/24/2011  *RADIOLOGY REPORT*  Clinical Data: Chest pain.  PORTABLE CHEST - 1 VIEW  Comparison: Chest radiograph performed 08/01/2011  Findings: The lungs are well-aerated and clear.  There is no evidence of focal  opacification, pleural effusion or pneumothorax.  The cardiomediastinal silhouette is within normal limits.  No acute osseous abnormalities are seen.  Scattered clips are noted at the superior mediastinum and left hilum.  An apparent calcific density overlying the right lung base seems to be within the overlying soft tissues.  IMPRESSION: No acute cardiopulmonary process seen.  Original Report Authenticated By: Tonia Ghent, M.D.     1. Chest pain       MDM  Exam is normal at this time, EKG shows no signs  of acute ischemia, patient is also chest pain-free. She does have several risk factors for heart disease including cholesterol and family history and thus will need blood work and likely admission for rule out.   Laboratory workup negative for any acute findings including normal troponin. Patient informed of results, she did have some recurrent chest pain while ambulating in the emergency Department, discussed with the hospitalist who will admit for further testing  Vida Roller, MD 09/24/11 [phone]

## 2011-09-24 NOTE — H&P (Signed)
PCP:  Dr. Alroy Dust  Chief Complaint:  Chest pain  HPI: The patient is a 78 year old white female with past medical history significant for hypercholesterolemia, GERD, history of lung cancer status post surgery in the past and was followed by Dr. Edwyna Shell with no evidence of recurrence, who was  awakened about 1 AM this morning with chest pain described as midsternal, pressure-like 10 out of 10 in intensity and a lasted for about 30-45 minutes. She states she had associated nausea, and it radiated to her neck and shoulders, and that it was worsened by inspiration. She denies any associated shortness of breath, diaphoresis. She had nausea but no vomiting. She called EMS and she was given nitroglycerin which relieved most of the pain. She states the pain was nonexertional. She denies cough, fever, PND, orthopnea no leg swelling. She was seen in the ED and a chest x-ray was done and showed no acute infiltrates. EKG did not show any acute ischemic findings, troponin are done in ED came back negative. She is admitted for further evaluation and management. Review of Systems:  The patient denies anorexia, fever, weight loss,, vision loss, decreased hearing, hoarseness, syncope, dyspnea on exertion, peripheral edema, balance deficits, hemoptysis, abdominal pain, melena, hematochezia, severe indigestion/heartburn, hematuria, incontinence, muscle weakness, transient blindness, difficulty walking, depression, unusual weight change, abnormal bleeding.  Past Medical History: Past Medical History  Diagnosis Date  . Allergic rhinitis   . History of bronchitis   . Malignant neoplasm of bronchus and lung, unspecified site   . Pure hypercholesterolemia   . Hiatal hernia   . GERD (gastroesophageal reflux disease)   . Diverticulosis of colon   . IBS (irritable bowel syndrome)   . Urinary tract infection, site not specified   . Osteoarthritis   . Lumbar back pain   . Osteopenia   . Insomnia    Past Surgical  History  Procedure Date  . Appendectomy   . Abdominal hysterectomy   . Cholecystectomy   . Bilateral shouler operations for impingement syndromes 2001    Dr. Renae Fickle  . Left lower lobectomy 11/2004    via VATS and mini-thoractomy for 1.5cm adenoca of lung by Dr. Edwyna Shell  . Urologic sugery with cystocele/rectocele repairs and sling 03/2007    Dr. Vonita Moss  . Bilateral cataract surgery 2012    Dr. Vonna Kotyk    Medications: Prior to Admission medications   Medication Sig Start Date End Date Taking? Authorizing Provider  aspirin 81 MG chewable tablet Chew 324 mg by mouth once.   Yes Historical Provider, MD  calcium carbonate (TUMS - DOSED IN MG ELEMENTAL CALCIUM) 500 MG chewable tablet Chew 3 tablets by mouth 3 (three) times daily as needed.   Yes Historical Provider, MD  Calcium Carbonate-Vitamin D (CALCIUM-D) 600-400 MG-UNIT TABS Take 1 tablet by mouth 2 (two) times daily.   Yes Historical Provider, MD  conjugated estrogens (PREMARIN) vaginal cream Use cream once weekly on Monday night   Yes Historical Provider, MD  Multiple Vitamins-Minerals (CENTRUM SILVER PO) Take 1 tablet by mouth daily.   Yes Historical Provider, MD  simvastatin (ZOCOR) 40 MG tablet Take 1 tablet (40 mg total) by mouth at bedtime. 08/01/11  Yes Michele Mcalpine, MD  traMADol (ULTRAM) 50 MG tablet Take 1 tablet (50 mg total) by mouth every 6 (six) hours as needed for pain. 09/18/11  Yes Michele Mcalpine, MD  nitroGLYCERIN (NITROSTAT) 0.4 MG SL tablet Place 0.4 mg under the tongue every 5 (five) minutes as  needed. For chest pain - 2 doses    Historical Provider, MD    Allergies:   Allergies  Allergen Reactions  . Codeine     Chest pain/tightness    Social History:  reports that she quit smoking about 16 years ago. Her smoking use included Cigarettes. She does not have any smokeless tobacco history on file. Her alcohol and drug histories not on file.  Family History: No family history on file.  Physical Exam: Filed Vitals:     09/24/11 0630 09/24/11 0645 09/24/11 0700 09/24/11 0715  BP: 119/49 108/51 115/59 116/48  Pulse: 69 69 71 73  Temp:      TempSrc:      Resp: 17 17 18 18   SpO2: 99% 99% 99% 98%   Constitutional: Vital signs reviewed.  Patient is a well-developed and well-nourished  in no acute distress and cooperative with exam. Alert and oriented x3.  Head: Normocephalic and atraumatic Mouth: no erythema or exudates, MMM Eyes: PERRL, EOMI, conjunctivae normal, No scleral icterus.  Neck: Supple, Trachea midline normal ROM, No JVD, mass, thyromegaly, or carotid bruit present.  Cardiovascular: RRR, S1 normal, S2 normal, no MRG, pulses symmetric and intact bilaterally Pulmonary/Chest: CTAB, no wheezes, rales, or rhonchi Abdominal: Soft. Non-tender, non-distended, bowel sounds are normal, no masses, organomegaly, or guarding present.  Musculoskeletal: No cyanosis and no edema  Neurological: A&O x3, Strenght is normal and symmetric bilaterally, cranial nerve II-XII are grossly intact, no focal motor deficit, sensory intact to light touch bilaterally.  Skin: Warm, dry and intact. No rash, cyanosis, or clubbing.  Psychiatric: Normal mood and affect. speech and behavior is normal.    Labs on Admission:   [facility] 09/24/11 0402  NA 138  K 4.3  CL 101  CO2 28  GLUCOSE 105*  BUN 14  CREATININE 0.93  CALCIUM 9.9  MG --  PHOS --   No results found for this basename: AST:2,ALT:2,ALKPHOS:2,BILITOT:2,PROT:2,ALBUMIN:2 in the last 72 hours No results found for this basename: LIPASE:2,AMYLASE:2 in the last 72 hours  Basename 09/24/11 0402  WBC 10.3  NEUTROABS 8.6*  HGB 11.9*  HCT 35.8*  MCV 88.6  PLT 242   No results found for this basename: CKTOTAL:3,CKMB:3,CKMBINDEX:3,TROPONINI:3 in the last 72 hours No results found for this basename: TSH,T4TOTAL,FREET3,T3FREE,THYROIDAB in the last 72 hours No results found for this basename: VITAMINB12:2,FOLATE:2,FERRITIN:2,TIBC:2,IRON:2,RETICCTPCT:2 in the last  72 hours  Radiological Exams on Admission: Dg Chest Port 1 View  09/24/2011  *RADIOLOGY REPORT*  Clinical Data: Chest pain.  PORTABLE CHEST - 1 VIEW  Comparison: Chest radiograph performed 08/01/2011  Findings: The lungs are well-aerated and clear.  There is no evidence of focal opacification, pleural effusion or pneumothorax.  The cardiomediastinal silhouette is within normal limits.  No acute osseous abnormalities are seen.  Scattered clips are noted at the superior mediastinum and left hilum.  An apparent calcific density overlying the right lung base seems to be within the overlying soft tissues.  IMPRESSION: No acute cardiopulmonary process seen.  Original Report Authenticated By: Tonia Ghent, M.D.    Assessment/Plan Present on Admission:  .Chest pain -As discussed above, cycle cardiac enzymes also obtain a d-dimer and if elevated further evaluate with a CT angio.  -place on aspirin, when necessary nitrates, continue to statin, follow consult and cardiology pending enzymes.  Marland KitchenGERD -Will place on a PPI  .HYPERCHOLESTEROLEMIA -Continue statin History of lung cancer-post surgery in the past, and followed by Dr. Kriste Basque, and per Dr Edwyna Shell in the past  no evidence  of recurrence. Hollynn Garno C 09/24/2011, 8:34 AM

## 2011-09-25 ENCOUNTER — Telehealth: Payer: Self-pay | Admitting: Pulmonary Disease

## 2011-09-25 ENCOUNTER — Observation Stay (HOSPITAL_COMMUNITY): Payer: Medicare Other

## 2011-09-25 DIAGNOSIS — E785 Hyperlipidemia, unspecified: Secondary | ICD-10-CM | POA: Diagnosis present

## 2011-09-25 DIAGNOSIS — R590 Localized enlarged lymph nodes: Secondary | ICD-10-CM

## 2011-09-25 DIAGNOSIS — J9811 Atelectasis: Secondary | ICD-10-CM | POA: Diagnosis present

## 2011-09-25 HISTORY — DX: Hyperlipidemia, unspecified: E78.5

## 2011-09-25 HISTORY — DX: Localized enlarged lymph nodes: R59.0

## 2011-09-25 LAB — CARDIAC PANEL(CRET KIN+CKTOT+MB+TROPI)
Relative Index: INVALID (ref 0.0–2.5)
Total CK: 69 U/L (ref 7–177)
Troponin I: <0.3 ng/mL (ref <0.30)

## 2011-09-25 MED ORDER — IOHEXOL 350 MG/ML SOLN
100.0000 mL | Freq: Once | INTRAVENOUS | Status: AC | PRN
  Administered 2011-09-25: 100 mL via INTRAVENOUS

## 2011-09-25 MED ORDER — PANTOPRAZOLE SODIUM 40 MG PO TBEC: 40.0000 mg | DELAYED_RELEASE_TABLET | Freq: Every day | ORAL | Status: DC

## 2011-09-25 NOTE — Telephone Encounter (Signed)
Spoke with pt and scheduled HFU with TP for 09/30/11.

## 2011-09-25 NOTE — Discharge Summary (Signed)
DISCHARGE SUMMARY  Krystal Craig  MR#: [id number]  DOB:04-16-1933  Date of Admission: 09/24/2011 Date of Discharge: 09/25/2011  Attending Physician:Leonda Cristo  Patient's PCP: scott Nadel,MD.  Consults: none  Discharge Diagnoses: Present on Admission:  .Chest pain .GERD .HYPERCHOLESTEROLEMIA .Thoracic lymphadenopathy .Atelectasis .Hyperlipidemia   Hospital Course: Krystal Craig was admitted on 09/24/11 with pleuritic chest pain. 3 sets of cardiac enzymes were negative , and an EKG  Showed sinus rhythm with no significant ST/T wave changes. A CT chest with contrast "1. No evidence of pulmonary embolus. 2. Bilateral dependent subsegmental atelectasis noted, somewhat nodular at the left lung base, without evidence of a focal mass. Lungs otherwise clear.  3. Mildly prominent 1.1 cm periaortic node seen; no additional evidence for mediastinal lymphadenopathy. ". Patient says she has been belching a lot. It appears her pleuritic pain is related to the atelectasis- could she have aspirated into the lungs? Other than the slightly enlarged LN, she has no other evidence to suggest recurrence of her lung cancer. Krystal Craig says her pain is gone, and requests going home to follow with Dr Kriste Basque. I have asked her to call [phone] if her pain recurs, otherwise she will call for an appointment with Dr Kriste Basque for further work up(?2decho, stress test/referral to cardiology/Dr Edwyna Shell- to compare CT chest results). She will have a trial of ppi in the interim, and may eventually need gi referral for egd once other possible causes of chest pain excluded. She is otherwise feeling great. I have discussed the plan of care with her husband and son.    Medication List  As of 09/25/2011 11:13 AM   STOP taking these medications         calcium carbonate 500 MG chewable tablet         TAKE these medications         aspirin 81 MG chewable tablet   Chew 324 mg by mouth once.      Calcium-D 600-400 MG-UNIT Tabs   Take 1 tablet by mouth 2 (two) times daily.      CENTRUM SILVER PO   Take 1 tablet by mouth daily.      nitroGLYCERIN 0.4 MG SL tablet   Commonly known as: NITROSTAT   Place 0.4 mg under the tongue every 5 (five) minutes as needed. For chest pain - 2 doses      pantoprazole 40 MG tablet   Commonly known as: PROTONIX   Take 1 tablet (40 mg total) by mouth daily at 12 noon.      PREMARIN vaginal cream   Generic drug: conjugated estrogens   Use cream once weekly on Monday night      simvastatin 40 MG tablet   Commonly known as: ZOCOR   Take 1 tablet (40 mg total) by mouth at bedtime.      traMADol 50 MG tablet   Commonly known as: ULTRAM   Take 1 tablet (50 mg total) by mouth every 6 (six) hours as needed for pain.             Day of Discharge BP 122/51  Pulse 66  Temp(Src) 97.9 F (36.6 C) (Oral)  Resp 18  Ht 5' 3.5" (1.613 m)  Wt 66.2 kg (145 lb 15.1 oz)  BMI 25.45 kg/m2  SpO2 97%  Physical Exam: Sitting up, not in distress. Lungs clear. S1s2 heard, no murmurs. Abdomen- soft, non tender. +BS. CNS- grossly intact.  Results for orders placed during the hospital encounter of 09/24/11 (from the  past 24 hour(s))  CARDIAC PANEL(CRET KIN+CKTOT+MB+TROPI)     Status: Normal   Collection Time   09/24/11  6:04 PM      Component Value Range   Total CK 72  7 - 177 (U/L)   CK, MB 1.3  0.3 - 4.0 (ng/mL)   Troponin I <0.30  <0.30 (ng/mL)   Relative Index RELATIVE INDEX IS INVALID  0.0 - 2.5   CARDIAC PANEL(CRET KIN+CKTOT+MB+TROPI)     Status: Normal   Collection Time   09/25/11  1:54 AM      Component Value Range   Total CK 69  7 - 177 (U/L)   CK, MB 1.2  0.3 - 4.0 (ng/mL)   Troponin I <0.30  <0.30 (ng/mL)   Relative Index RELATIVE INDEX IS INVALID  0.0 - 2.5     Disposition: home to follow with Dr Kriste Basque in 1 week.   Follow-up Appts: Discharge Orders    Future Appointments: Provider: Department: Dept Phone: Center:   07/31/2012 10:00 AM Michele Mcalpine, MD  [facility] [phone] None     Future Orders Please Complete By Expires   Diet - low sodium heart healthy      Increase activity slowly           Tests Needing Follow-up: Stress test/2decho?  Time spent in discharge (includes decision making & examination of pt): 35 minutes  Signed: Izaac Reisig 09/25/2011, 11:13 AM

## 2011-09-25 NOTE — Telephone Encounter (Signed)
I spoke with pt and advised her she will have to get whoever she is seeing over there to order an EKG on her. She stated she did not know that and needed nothing further

## 2011-09-25 NOTE — Progress Notes (Signed)
DC IV, DC Tele, DC home. Discharge instructions and home medications discussed with patient and patient's family. Patient and family denied any questions or concerns at this time. Patient leaving unit via wheelchair and appears in no acute distress.

## 2011-09-30 ENCOUNTER — Ambulatory Visit (INDEPENDENT_AMBULATORY_CARE_PROVIDER_SITE_OTHER): Payer: Medicare Other | Admitting: Adult Health

## 2011-09-30 ENCOUNTER — Encounter: Payer: Self-pay | Admitting: Adult Health

## 2011-09-30 DIAGNOSIS — R072 Precordial pain: Secondary | ICD-10-CM | POA: Insufficient documentation

## 2011-09-30 DIAGNOSIS — R079 Chest pain, unspecified: Secondary | ICD-10-CM

## 2011-09-30 DIAGNOSIS — R0789 Other chest pain: Secondary | ICD-10-CM

## 2011-09-30 DIAGNOSIS — K219 Gastro-esophageal reflux disease without esophagitis: Secondary | ICD-10-CM

## 2011-09-30 NOTE — Patient Instructions (Signed)
GERD Diet  Begin Protonix 40mg  daily when arrives May use Miralax As needed  Constipation  May take Probiotic -ie Align daily to help with regularity.  If you are not improving will need to call back, for referral to Gastroenterology.  We are referring you to cardiology for evaluation of chest pain  Please contact office for sooner follow up if symptoms do not improve or worsen or seek emergency care  follow up Dr. Kriste Basque  In 6-8 weeks and As needed

## 2011-09-30 NOTE — Progress Notes (Signed)
Subjective:     Patient ID: Krystal Craig, female   DOB: Aug 23, 1932, 78 y.o.   MRN: [id number]  HPI  78 y/o WF  ~  August 11, 2009:  she has had a good year overall... still sees Krystal Craig for f/u LLL bronchoalveolar cell ca surg 2006- had CXR 3/10 & CT Chest 10/10 w/o new lesions seen... she had a purplexing "burning tongue" problems and was seen by dentists at [facility] who rec she stop her Boniva> she thinks symptoms improved off the Bisphos & doesn't want to take these meds... she had her 2010 flu vaccine.  ~  July 31, 2010:  Yearly ROV & still notes the burning tongueprob even w/o the Bisphos rx, pt notes that Prilosec makes it worse;  also c/o recent URI- allergy symptoms sneezing, sinus HA, etc> we discussed Rx w/ Antihist in AM, Saline nasal mist Q1-2H, Flonase Qhs; she recently had bilart cat surg.     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    She remains stable on Simva40 for chol;  GI is stable & she had her last colon 3/05 (tortuous, divertics, no polyps);  DJD & LBP w/ chiropractor rx;  as noted she is off Bisphos meds w/ osteopenia on BMD followed by GYN & [facility]...  ~  August 01, 2011:  Yearly ROV & review> Krystal Craig states that she is doing well, hjad a good yr, no new complaints or concerns...     AR, Hx bronchitis> she uses OTC antihist etc; denies recurrent bronchitic infections w/o cough, sput, hemop, SOB, etc...    Hx Lung Cancer> s/p LLLobectomy 2006 for Bronchoalveolar cell ca; followed up w/ Krystal Craig Q54mo for 61yrs (see below); now rec CXR yearly & CTChest QoYear...    Hx Palpit> On ASA81; she notes rare palpit, self limited- no CP, dizziness, syncope,etc...    Hyperlipid> On Simva40;  FLP showed TChol 184, TG 212, HDL  56, LDL 100; advised better low fat diet...    HH, GERD> On Prilosec OTC as needed & states she hasn't needed it; denies heartburn, reflux, dysphagia, abd pain, n/v, etc...    Divertics, IBS> she denies abd discomfort, d/c/ blood seen, etc; she will be due for 38yr f/u colon 3/15    Hx recurrent UTIs> none since Krystal Craig did her cystocele, rectocele, & sling surg 10/08; followed by Krystal Craig now...    DJD, LBP> On Tramadol prn; she manages well, get plenty of exercise, still sees Chiropractor Qmo...    Osteopenia> On Calcium, MVI, VitD; BMDs per gyn- "Krystal Craig said to just watch it"... CXR 2/13 showed heart size at upper limit, calcif tort Ao, s/p surg on left w/ hyperinflation, scarring left base, NAD... LABS 2/13:  FLP- at goals on Simva40 x TG212;  Chems- wnl;  CBC- wnl;  TSH=2.06;  VitD=54;  UA- clear  09/30/2011 Post hospital follow up  Patient presents for a post hospitalization followup. Patient was admitted April 9 through 09/25/2011 for pleuritic chest pain. Cardiac enzymes were negative. EKG showed no acute changes. Patient underwent a CT chest with contrast that was negative for pulmonary emboli. There was some bilateral dependent subsegmental atelectasis noted at the left base. Mildly prominent 1.1 cm periaortic node.  This was somewhat nodular in appearance. No evidence of a focal mass. She was given a trial of protein pump inhibitors for possible GERD component. It was not completely understood why she had pleuritic chest pain. It has been suggested for her to have further evaluation, outpatient setting. She has a strong family hx of CAD , brother with CAD, Mother with hx of MI .   Since discharge. Patient is feeling better but still has some epigastric burning despite GERD diet. No dysphagia. Also, complains of constipation with hard stools. She is using a stool softener daily. She denies any exertional chest pain, fever, nausea, vomiting, diarrhea, or bloody stools. No syncope or  palpitations. She was discharged on Protonix however, has not received this prescription due to [facility] as mail order. She is taking Prilosec 20 mg daily without mitral. He. She is expecting her delivery.of protonix  any day now.   PROBLEM LIST:    ALLERGIC RHINITIS (ICD-477.9) - she uses OTC Zyrtek Prn> advised sinus regimen w/ Antihist Qam, Nasal saline mist every 1-2H during the day, Mucinex 1-2 Bid w/ fluids, & Flonase Qhs...  Hx of BRONCHITIS (ICD-490) - no recent URI's or bronchitic infections... she denies cough, sputum, hemoptysis, worsening dyspnea,  wheezing, chest pains, snoring, daytime hypersomnolence, etc...   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 underwent LLLobectomy via VATS & minithoracotomy by Krystal Craig 6/06 w/ 1.5cm BRONCHOALVEOLAR CELL CANCER & neg nodes... she saw Krystal Craig post-op, no adjuvant therapy recommended... she's been followed regularly by Krystal Craig since surg Q90mo w/ CXR, alt w/ CT scans... ~  CXR 3/09 by Krystal Craig- no evid of recurrence, left pleural thickening, otherw neg... ~  CT Chest 9/09 showed post-op changes, precarinal LN w/o change, 3mm nodule ant left lung w/o change, scar RML w/o change, thoracic spondy- NAD.Marland Kitchen. ~  CXR 3/10 showed vol loss on left w/ blunt left angle, NAD. ~  CT Chest 10/10 showed clear lungs w/o nodules or infiltrates, stable precarinal LN. ~  CXR 4/11 revealed post op changes, NAD.Marland KitchenMarland Kitchen ~  CT Chest 11/11 showed postop changes, NAD.Marland Kitchen. ~  CXR 2/13 showed heart size at upper limit, calcif tort Ao, s/p surg on left w/ hyperinflation, scarring left base, NAD...   PALPITATIONS, HX OF (ICD-V12.50) - she takes ASA 81mg /d... notes rare palpit, no CP/ syncope/ dyspnea, edema, etc... ~  NuclearStressTest 12/05 was neg- no scar or ischemia, EF= 70%...  HYPERCHOLESTEROLEMIA (ICD-272.0) - on SIMVASTATIN 40mg /d + low fat diet... ~  FLP 11/07 showed TChol 181, TG -, HDL 48, LDL 95 ~  FLP 1/09 showed  TChol 162, TG 198, HDL 43, LDL 80 ~  FLP 2/10 showed TChol 132, TG 144, HDL 41, LDL 62 ~  FLP 2/11 showed TChol 167, TG 271, HDL 57, LDL 90... rec> better low fat diet. ~  FLP 2/12 showed TChol 173, TG 165, HDL 56, LDL 84 ~  FLP 2/13 on Simva40 showed TChol 184, TG 212, HDL 56, LDL 100   HIATAL HERNIA (ICD-553.3), & GERD (ICD-530.81) - she states she is doing fine and does not require PPI therapy... 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 ~  2/11: she notes using occas Mylanta & advised to use Prilosec Prn.  DIVERTICULOSIS OF COLON (ICD-562.10), & IRRITABLE BOWEL SYNDROME (ICD-564.1) - last colonoscopy 3/05 by DrSamLeB showed tortuous colon, divertics, otherw neg...  Hx of UTI'S, RECURRENT (ICD-599.0) - she had recurrent  UTI's leading DrGottsegen to refer her to Krystal Craig for surg w/ cystocele/ rectocele repairs & sling 10/08... ~  She denies recurrent UTIs & is now followed by Krystal Craig...  OSTEOARTHRITIS (ICD-715.90) - she has had trigger fingers and several injections by DrSypher... ~  s/p bilat shoulder operations for impingement syndromes in 2001 by DrPaul... ~  She takes TRAMADOL 50mg  prn pain...  BACK PAIN, LUMBAR (ICD-724.2) - she still sees her chiropractor every month for LBP adjustments...  OSTEOPENIA (ICD-733.90) - she states that BMD's are done by DrGottsegen et al and she was prev on Boniva but stopped this in 2010 due to "burning tongue" & ultimately has dental consult at [facility] w/ rec to stop the Bisphos Rx (symptoms did abate somewhat)... she takes Calcium, MVI, VitD, & Krystal Craig has her on Premarin Vag cream weekly... ~  BMD at [facility] 5/08 showed TScores +0.1 in Spine, and -2.0 in left FemNeck (sl worse than 2006) ~  BMD in 8/10 at [facility] showed TScore ?Spine, and -1.9 in FemNeck (sl improved from 2008) ~  Osteopenia followed by GYN & they do her BMDs; she tells me that Krystal Craig said to "just watch it" since she  is INTOL to Bisphos rx.  INSOMNIA (ICD-780.52) - prev on Ambien Prn but she indicates that she rests well now w/o need for meds...  Health Maintenance: ~  GYN:  she has a new GYN= Krystal Craig on Premarin cream weekly...   Past Surgical History  Procedure Date  . Appendectomy   . Abdominal hysterectomy   . Cholecystectomy   . Bilateral shouler operations for impingement syndromes 2001    Dr. Renae Fickle  . Left lower lobectomy 11/2004    via VATS and mini-thoractomy for 1.5cm adenoca of lung by Dr. Edwyna Shell  . Urologic sugery with cystocele/rectocele repairs and sling 03/2007    Dr. Vonita Moss  . Bilateral cataract surgery 2012    Dr. Vonna Kotyk  . Appendectomy   . Lobectomy 11/20/2004    20 % of left lung       Outpatient Encounter Prescriptions as of 09/30/2011  Medication Sig Dispense Refill  . aspirin 81 MG chewable tablet Chew 324 mg by mouth once.      . Calcium Carbonate-Vitamin D (CALCIUM-D) 600-400 MG-UNIT TABS Take 1 tablet by mouth 2 (two) times daily.      Marland Kitchen conjugated estrogens (PREMARIN) vaginal cream Use cream once weekly on Monday night      . Multiple Vitamins-Minerals (CENTRUM SILVER PO) Take 1 tablet by mouth daily.      . nitroGLYCERIN (NITROSTAT) 0.4 MG SL tablet Place 0.4 mg under the tongue every 5 (five) minutes as needed. For chest pain - 2 doses      . pantoprazole (PROTONIX) 40 MG tablet Take 1 tablet (40 mg total) by mouth daily at 12 noon.  30 tablet  0  . simvastatin (ZOCOR) 40 MG tablet Take 1 tablet (40 mg total) by mouth at bedtime.  90 tablet  3  . traMADol (ULTRAM) 50 MG tablet Take 1 tablet (50 mg total) by mouth every 6 (six) hours as needed for pain.  270 tablet  3    Allergies  Allergen Reactions  . Codeine     Chest pain/tightness    Current Medications, Allergies, Past Medical History, Past Surgical History, Family History, and Social History were reviewed in [employer] record.     Review of Systems Constitutional:   No  weight  loss, night sweats,  Fevers, chills,  +  fatigue, or  lassitude.  HEENT:   No headaches,  Difficulty swallowing,  Tooth/dental problems, or  Sore throat,                No sneezing, itching, ear ache, nasal congestion, post nasal drip,   CV:  No chest pain,  Orthopnea, PND, swelling in lower extremities, anasarca, dizziness, palpitations, syncope.   GI  No  nausea, vomiting, diarrhea, , loss of appetite, bloody stools.   Resp: No shortness of breath with exertion or at rest.  No excess mucus, no productive cough,  No non-productive cough,  No coughing up of blood.  No change in color of mucus.  No wheezing.  No chest wall deformity  Skin: no rash or lesions.  GU: no dysuria, change in color of urine, no urgency or frequency.  No flank pain, no hematuria   MS:  No joint pain or swelling.  No decreased range of motion.  No back pain.  Psych:  No change in mood or affect. No depression or anxiety.  No memory loss.                Objective:   Physical Exam      WD, WN, 78 y/o WF in NAD... GENERAL:  Alert & oriented; pleasant & cooperative. HEENT:  [place]/AT,   EACs-clear, TMs-wnl, NOSE-clear, THROAT-clear & wnl. NECK:  Supple w/ fairROM; no JVD; normal carotid impulses w/o bruits; no thyromegaly or nodules palpated; no lymphadenopathy. CHEST:  Clear to P & A; without wheezes/ rales/ or rhonchi; scar from prev surg on left. HEART:  Regular Rhythm; without murmurs/ rubs/ or gallops heard... ABDOMEN:  Soft & nontender; normal bowel sounds; no organomegaly or masses detected. EXT: without deformities, mild arthritic changes; no varicose veins/ venous insuffic/ or edema. NEURO:    gait normal & balance OK. DERM:  No lesions noted; no rash etc...    Assessment:

## 2011-09-30 NOTE — Assessment & Plan Note (Signed)
Atypical chest pain, with suspected underlying reflux. We'll continue on a reflux preventive diet, along with PPI therapy. If not proven. Will need further evaluation. Patient does have a strong family history of heart disease. Patient's workup during her hospitalization was unremarkable with negative cardiac enzymes and EKG however, due to her age and family history, refer to cardiology to evaluate any further testing is needed.

## 2011-09-30 NOTE — Assessment & Plan Note (Signed)
Have advised. Begin Protonix on arrival. Continue on reflux preventive measures. If not improving will need referral to GI. 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 Advised to use a fiber rich diet, along with Align  if needed. May also use MiraLAX As needed  Constipation .

## 2011-10-01 ENCOUNTER — Encounter: Payer: Self-pay | Admitting: Cardiovascular Disease

## 2011-10-01 ENCOUNTER — Ambulatory Visit (INDEPENDENT_AMBULATORY_CARE_PROVIDER_SITE_OTHER): Payer: Medicare Other | Admitting: Cardiovascular Disease

## 2011-10-01 VITALS — BP 120/78 | HR 68 | Ht 63.5 in | Wt 145.4 lb

## 2011-10-01 DIAGNOSIS — R072 Precordial pain: Secondary | ICD-10-CM

## 2011-10-01 NOTE — Assessment & Plan Note (Signed)
The patient has chest pain with typical and atypical features. While her symptoms occurred at rest, the characteristics of her pain are certainly concerning. It is somewhat reassuring that she ruled out for myocardial infarction and had no significant EKG changes. Considering her underlying hyperlipidemia, remote smoking history, and family history of CAD, I have recommended that she undergo a Lexiscan Myoview stress test to rule out significant ischemia. She is on appropriate medication with aspirin for antiplatelet therapy and simvastatin for lipid lowering. She has sublingual nitroglycerin in case symptoms recur. I will see her back in followup if there is any abnormality on her stress test. Otherwise she will continue to follow with Dr. Kriste Basque and I would be happy to see her in the future if problems arise.

## 2011-10-01 NOTE — Patient Instructions (Signed)
Your physician has requested that you have a lexiscan myoview. For further information please visit [email or web address]. Please follow instruction sheet, as given.   Your physician recommends that you schedule a follow-up appointment as needed.   

## 2011-10-01 NOTE — Progress Notes (Signed)
HPI:  This is a 78 year old woman presenting for initial cardiac evaluation. The patient was evaluated in the emergency department one week ago for chest pain. She awoke with severe substernal chest pain radiating to the shoulders and neck. She describes the pain as "crushing." She immediately called 911 and EMS transported her to the emergency department. Her pain was relieved with 2 sublingual nitroglycerin. This pain lasted for approximately one hour. She ruled out for myocardial infarction with negative cardiac enzymes. Her EKG showed no acute changes. She had some residual chest discomfort only with inspiration, but all of this has now resolved. She has no further symptoms. She specifically denies any recurrent chest pain or pressure. She denies edema, orthopnea, or PND. She's had no lightheadedness or syncope. She does have palpitations on occasion, but these are unchanged. She has chronic exertional dyspnea, also unchanged.  Her cardiovascular risk factors include treated hyperlipidemia. She has no history of hypertension or diabetes. She has a family history of coronary artery disease as detailed below.  Outpatient Encounter Prescriptions as of 10/01/2011  Medication Sig Dispense Refill  . aspirin 81 MG tablet Take 81 mg by mouth daily.      . Calcium Carbonate-Vitamin D (CALCIUM-D) 600-400 MG-UNIT TABS Take 1 tablet by mouth 2 (two) times daily.      Marland Kitchen conjugated estrogens (PREMARIN) vaginal cream Use cream once weekly on Monday night      . Multiple Vitamins-Minerals (CENTRUM SILVER PO) Take 1 tablet by mouth daily.      . nitroGLYCERIN (NITROSTAT) 0.4 MG SL tablet Place 0.4 mg under the tongue every 5 (five) minutes as needed. For chest pain - 2 doses      . pantoprazole (PROTONIX) 40 MG tablet Take 1 tablet (40 mg total) by mouth daily at 12 noon.  30 tablet  0  . simvastatin (ZOCOR) 40 MG tablet Take 1 tablet (40 mg total) by mouth at bedtime.  90 tablet  3  . traMADol (ULTRAM) 50 MG  tablet Take 1 tablet (50 mg total) by mouth every 6 (six) hours as needed for pain.  270 tablet  3  . DISCONTD: aspirin 81 MG chewable tablet Chew 324 mg by mouth once.        Codeine  Past Medical History  Diagnosis Date  . Allergic rhinitis   . History of bronchitis   . Malignant neoplasm of bronchus and lung, unspecified site   . Pure hypercholesterolemia   . Hiatal hernia   . GERD (gastroesophageal reflux disease)   . Diverticulosis of colon   . IBS (irritable bowel syndrome)   . Urinary tract infection, site not specified   . Osteoarthritis   . Lumbar back pain   . Osteopenia   . Insomnia   . Complication of anesthesia   . PONV (postoperative nausea and vomiting)   . Chest pain 09/24/2011    Past Surgical History  Procedure Date  . Appendectomy   . Abdominal hysterectomy   . Cholecystectomy   . Bilateral shouler operations for impingement syndromes 2001    Dr. Renae Fickle  . Left lower lobectomy 11/2004    via VATS and mini-thoractomy for 1.5cm adenoca of lung by Dr. Edwyna Shell  . Urologic sugery with cystocele/rectocele repairs and sling 03/2007    Dr. Vonita Moss  . Bilateral cataract surgery 2012    Dr. Vonna Kotyk  . Appendectomy   . Lobectomy 11/20/2004    20 % of left lung       History  Social History  . Marital Status: Married    Spouse Name: N/A    Number of Children: N/A  . Years of Education: N/A   Occupational History  . Not on file.   Social History Main Topics  . Smoking status: Former Smoker -- 1.0 packs/day for 30 years    Types: Cigarettes    Quit date: 06/17/1984  . Smokeless tobacco: Never Used  . Alcohol Use: No  . Drug Use: No  . Sexually Active: Not Currently    Birth Control/ Protection: Post-menopausal   Other Topics Concern  . Not on file   Social History Narrative  . No narrative on file    Family history: The patient's mother died of a myocardial infarction in her early 7s. Her brother has coronary artery disease which was diagnosed at  age 15. Her father died of a stroke.  ROS: General: no fevers/chills/night sweats Eyes: no blurry vision, diplopia, or amaurosis ENT: no sore throat or hearing loss Resp: no cough, wheezing, or hemoptysis. Positive for chronic exertional dyspnea CV: no edema. Positive for palpitations GI: no abdominal pain, nausea, vomiting, diarrhea, or constipation GU: no dysuria, frequency, or hematuria Skin: no rash Neuro: no headache, numbness, tingling, or weakness of extremities Musculoskeletal: Positive for low back and neck pain Heme: no bleeding, DVT, or easy bruising Endo: no polydipsia or polyuria  BP 120/78  Pulse 68  Ht 5' 3.5" (1.613 m)  Wt 65.953 kg (145 lb 6.4 oz)  BMI 25.35 kg/m2  PHYSICAL EXAM: Pt is alert and oriented, WD, WN, pleasant woman in no distress. HEENT: normal Neck: JVP normal. Carotid upstrokes normal without bruits. No thyromegaly. Lungs: equal expansion, clear bilaterally CV: Apex is discrete and nondisplaced, RRR without murmur or gallop Abd: soft, NT, +BS, no bruit, no hepatosplenomegaly Back: no CVA tenderness Ext: no C/C/E        Femoral pulses 2+= without bruits        DP/PT pulses intact and = Skin: warm and dry without rash Neuro: CNII-XII intact             Strength intact = bilaterally  EKG:  Tracing reviewed from September 24, 2011. This shows normal sinus rhythm and is within normal limits.  ASSESSMENT AND PLAN:

## 2011-10-10 ENCOUNTER — Ambulatory Visit (HOSPITAL_COMMUNITY): Payer: Medicare Other | Attending: Cardiovascular Disease | Admitting: Radiology

## 2011-10-10 VITALS — BP 121/66 | Ht 63.0 in | Wt 146.0 lb

## 2011-10-10 DIAGNOSIS — R079 Chest pain, unspecified: Secondary | ICD-10-CM

## 2011-10-10 DIAGNOSIS — E785 Hyperlipidemia, unspecified: Secondary | ICD-10-CM | POA: Insufficient documentation

## 2011-10-10 DIAGNOSIS — R002 Palpitations: Secondary | ICD-10-CM | POA: Insufficient documentation

## 2011-10-10 DIAGNOSIS — Z8249 Family history of ischemic heart disease and other diseases of the circulatory system: Secondary | ICD-10-CM | POA: Insufficient documentation

## 2011-10-10 DIAGNOSIS — R0609 Other forms of dyspnea: Secondary | ICD-10-CM | POA: Insufficient documentation

## 2011-10-10 DIAGNOSIS — R0989 Other specified symptoms and signs involving the circulatory and respiratory systems: Secondary | ICD-10-CM | POA: Insufficient documentation

## 2011-10-10 DIAGNOSIS — R0602 Shortness of breath: Secondary | ICD-10-CM

## 2011-10-10 DIAGNOSIS — R072 Precordial pain: Secondary | ICD-10-CM | POA: Insufficient documentation

## 2011-10-10 DIAGNOSIS — Z87891 Personal history of nicotine dependence: Secondary | ICD-10-CM | POA: Insufficient documentation

## 2011-10-10 MED ORDER — REGADENOSON 0.4 MG/5ML IV SOLN
0.4000 mg | Freq: Once | INTRAVENOUS | Status: AC
  Administered 2011-10-10: 0.4 mg via INTRAVENOUS

## 2011-10-10 MED ORDER — TECHNETIUM TC 99M TETROFOSMIN IV KIT
30.0000 | PACK | Freq: Once | INTRAVENOUS | Status: AC | PRN
  Administered 2011-10-10: 30 via INTRAVENOUS

## 2011-10-10 MED ORDER — TECHNETIUM TC 99M TETROFOSMIN IV KIT
10.0000 | PACK | Freq: Once | INTRAVENOUS | Status: AC | PRN
  Administered 2011-10-10: 10 via INTRAVENOUS

## 2011-10-10 NOTE — Progress Notes (Signed)
[facility] SITE 3 NUCLEAR MED [address] [place] [address] [phone]  Cardiology Nuclear Med Study  Krystal Craig is a 78 y.o. female     MRN : [id number]     DOB: [birth year]  Procedure Date: 10/10/2011  Nuclear Med Background Indication for Stress Test:  Evaluation for Ischemia History: 2005 MPS: NL EF: 70%,  Cardiac Risk Factors: Family History - CAD, History of Smoking and Lipids  Symptoms:  Chest Pain, DOE and Palpitations   Nuclear Pre-Procedure Caffeine/Decaff Intake:  None NPO After: 6pm   Lungs:  clear O2 Sat: 94% on room air. IV 0.9% NS with Angio Cath:  22g  IV Site: R Hand  IV Started by:  Bonnita Levan, RN  Chest Size (in):  36 Cup Size: B  Height: 5\' 3"  (1.6 m)  Weight:  146 lb (66.225 kg)  BMI:  Body mass index is 25.86 kg/(m^2). Tech Comments:  N/A    Nuclear Med Study 1 or 2 day study: 1 day  Stress Test Type:  Eugenie Birks  Reading MD: Kristeen Miss, MD  Order Authorizing Provider:  M.Cooper  Resting Radionuclide: Technetium 64m Tetrofosmin  Resting Radionuclide Dose: 11.0 mCi   Stress Radionuclide:  Technetium 53m Tetrofosmin  Stress Radionuclide Dose: 33.0 mCi           Stress Protocol Rest HR: 59 Stress HR: 99  Rest BP: 121/66 Stress BP: 136/64  Exercise Time (min): n/a METS: n/a   Predicted Max HR: 144 bpm % Max HR: 68.75 bpm Rate Pressure Product: 95284   Dose of Adenosine (mg):  n/a Dose of Lexiscan: 0.4 mg  Dose of Atropine (mg): n/a Dose of Dobutamine: n/a mcg/kg/min (at max HR)  Stress Test Technologist: Milana Na, EMT-P  Nuclear Technologist:  Domenic Polite, CNMT     Rest Procedure:  Myocardial perfusion imaging was performed at rest 45 minutes following the intravenous administration of Technetium 63m Tetrofosmin. Rest ECG: NSR - Normal EKG  Stress Procedure:  The patient received IV Lexiscan 0.4 mg over 15-seconds.  Technetium 67m Tetrofosmin injected at 30-seconds.  There were no significant  changes, + sob, funny in her stomach, and rare pacs with Lexiscan.  Quantitative spect images were obtained after a 45 minute delay. Stress ECG: No significant change from baseline ECG  QPS Raw Data Images:  Normal; no motion artifact; normal heart/lung ratio. Stress Images:  Normal homogeneous uptake in all areas of the myocardium. Rest Images:  Normal homogeneous uptake in all areas of the myocardium. Subtraction (SDS):  No evidence of ischemia. Transient Ischemic Dilatation (Normal <1.22):  1.21 Lung/Heart Ratio (Normal <0.45):  0.33  Quantitative Gated Spect Images QGS EDV:  52 ml QGS ESV:  17 ml  Impression Exercise Capacity:  Lexiscan with no exercise. BP Response:  Normal blood pressure response. Clinical Symptoms:  No significant symptoms noted. ECG Impression:  No significant ST segment change suggestive of ischemia. Comparison with Prior Nuclear Study: No images to compare  Overall Impression:  Normal stress nuclear study.  No evidence of ischemia.    LV Ejection Fraction: 67%.  LV Wall Motion:  NL LV Function; NL Wall Motion    [name], Montez Hageman., MD, [facility] 10/10/2011, 5:24 PM

## 2011-10-14 ENCOUNTER — Encounter: Payer: Self-pay | Admitting: Adult Health

## 2011-10-23 ENCOUNTER — Encounter: Payer: Self-pay | Admitting: Pulmonary Disease

## 2011-11-18 ENCOUNTER — Ambulatory Visit (INDEPENDENT_AMBULATORY_CARE_PROVIDER_SITE_OTHER): Payer: Medicare Other | Admitting: Pulmonary Disease

## 2011-11-18 ENCOUNTER — Encounter: Payer: Self-pay | Admitting: Pulmonary Disease

## 2011-11-18 ENCOUNTER — Ambulatory Visit: Payer: Medicare Other | Admitting: Pulmonary Disease

## 2011-11-18 VITALS — BP 138/76 | HR 73 | Temp 96.8°F | Ht 63.5 in | Wt 149.0 lb

## 2011-11-18 DIAGNOSIS — M545 Low back pain, unspecified: Secondary | ICD-10-CM

## 2011-11-18 DIAGNOSIS — C3490 Malignant neoplasm of unspecified part of unspecified bronchus or lung: Secondary | ICD-10-CM

## 2011-11-18 DIAGNOSIS — K589 Irritable bowel syndrome without diarrhea: Secondary | ICD-10-CM

## 2011-11-18 DIAGNOSIS — Z8679 Personal history of other diseases of the circulatory system: Secondary | ICD-10-CM

## 2011-11-18 DIAGNOSIS — K219 Gastro-esophageal reflux disease without esophagitis: Secondary | ICD-10-CM

## 2011-11-18 DIAGNOSIS — K449 Diaphragmatic hernia without obstruction or gangrene: Secondary | ICD-10-CM

## 2011-11-18 DIAGNOSIS — E7800 Pure hypercholesterolemia, unspecified: Secondary | ICD-10-CM

## 2011-11-18 DIAGNOSIS — M1990 Unspecified osteoarthritis, unspecified site: Secondary | ICD-10-CM

## 2011-11-18 DIAGNOSIS — K5730 Diverticulosis of large intestine without perforation or abscess without bleeding: Secondary | ICD-10-CM

## 2011-11-18 DIAGNOSIS — R072 Precordial pain: Secondary | ICD-10-CM

## 2011-11-18 NOTE — Patient Instructions (Signed)
Today we updated your med list in our EPIC system...    Continue your current medications the same...  We discussed adding regular meds for Constipation:    Add MIRALAX OTC- one capful of the powder in water daily...    You may also add SENAKOT-S 1-2 tabs at bedtime...  For the GAS:    Use any SIMETHOCONE product> eg MYLICON, GasX, MaaloxPlus, etc and you may take these 4 times daily as needed...  Call for any problems, or if we can be of service in any way.Marland KitchenMarland Kitchen

## 2011-11-18 NOTE — Progress Notes (Signed)
Subjective:     Patient ID: Krystal Craig, female   DOB: 17-Oct-1932, 78 y.o.   MRN: [id number]  HPI 78 y/o WF here for a follow up visit... she has multiple medical problems as noted below...   ~  August 11, 2009:  she has had a good year overall... still sees DrBurney for f/u LLL bronchoalveolar cell ca surg 2006- had CXR 3/10 & CT Chest 10/10 w/o new lesions seen... she had a purplexing "burning tongue" problems and was seen by dentists at [facility] who rec she stop her Boniva> she thinks symptoms improved off the Bisphos & doesn't want to take these meds... she had her 2010 flu vaccine.  ~  July 31, 2010:  Yearly ROV & still notes the burning tongueprob even w/o the Bisphos rx, pt notes that Prilosec makes it worse;  also c/o recent URI- allergy symptoms sneezing, sinus HA, etc> we discussed Rx w/ Antihist in AM, Saline nasal mist Q1-2H, Flonase Qhs; she recently had bilart cat surg.     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    She remains stable on Simva40 for chol;  GI is stable & she had her last colon 3/05 (tortuous, divertics, no polyps);  DJD & LBP w/ chiropractor rx;  as noted she is off Bisphos meds w/ osteopenia on BMD followed by GYN & [facility]...  ~  August 01, 2011:  Yearly ROV & review> Krystal Craig states that she is doing well, hjad a good yr, no new complaints or concerns...     AR, Hx bronchitis> she uses OTC antihist etc; denies recurrent bronchitic infections w/o cough, sput, hemop, SOB, etc...    Hx Lung Cancer> s/p LLLobectomy 2006 for Bronchoalveolar cell ca; followed up w/ DrBurney Q49mo for 84yrs (see below); now rec CXR yearly & CTChest QoYear...    Hx Palpit> On ASA81; she notes rare palpit, self limited- no CP, dizziness,  syncope,etc...    Hyperlipid> On Simva40;  FLP showed TChol 184, TG 212, HDL 56, LDL 100; advised better low fat diet...    HH, GERD> On Prilosec OTC as needed & states she hasn't needed it; denies heartburn, reflux, dysphagia, abd pain, n/v, etc...    Divertics, IBS> she denies abd discomfort, d/c/ blood seen, etc; she will be due for 53yr f/u colon 3/15    Hx recurrent UTIs> none since DrPeterson did her cystocele, rectocele, & sling surg 10/08; followed by Susy Frizzle now...    DJD, LBP> On Tramadol prn; she manages well, get plenty of exercise, still sees Chiropractor Qmo...    Osteopenia> On Calcium, MVI, VitD; BMDs per gyn- "DrMody said to just watch it"... CXR 2/13 showed heart size at upper limit, calcif tort Ao, s/p surg on left w/ hyperinflation, scarring left base, NAD... LABS 2/13:  FLP- at goals on Simva40 x TG212;  Chems- wnl;  CBC- wnl;  TSH=2.06;  VitD=54;  UA- clear  ~  November 18, 2011:  59mo ROV & post hospital check> Krystal Craig was [facility] 4/9 - 09/25/11 by [facility] w/ CP> she had a hx that sounded like angina; CTAngio showing neg for PE, bibasilar atx, one prom periaortic node, divertics in colon; CXR showed normal heart size &  essentially clear lungs w/ surg clips in left hilum;  Neg cardiac enzymes, and normal baseline EKG; LABS were essentially neg as well... She saw DrCooper in office 4/13 & he did Myoview> neg- no ischemia, no infarct, normal wall motion & EF=67%... He told Krystal Craig that it was prob spasm & she has NTG for prn use if needed... She was disch on Protonix but she says it made her nauseated & she stopped it, now feels better x for gas & we discussed Simethacone- Mylicon, GasX, etc; also notes some constipation & we reviewed Miralax, Senakot-S... We reviewed prob list, meds, xrays and labs> see below>>   PROBLEM LIST:    ALLERGIC RHINITIS (ICD-477.9) - she uses OTC Zyrtek Prn> advised sinus regimen w/ Antihist Qam, Nasal saline mist every 1-2H during the day, Mucinex 1-2 Bid w/  fluids, & Flonase Qhs...  Hx of BRONCHITIS (ICD-490) - no recent URI's or bronchitic infections... she denies cough, sputum, hemoptysis, worsening dyspnea,  wheezing, chest pains, snoring, daytime hypersomnolence, etc...   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 underwent LLLobectomy via VATS & minithoracotomy by DrBurney 6/06 w/ 1.5cm BRONCHOALVEOLAR CELL CANCER & neg nodes... she saw DrMohammed post-op, no adjuvant therapy recommended... she's been followed regularly by DrBurney since surg Q41mo w/ CXR, alt w/ CT scans... ~  CXR 3/09 by DrBurney- no evid of recurrence, left pleural thickening, otherw neg... ~  CT Chest 9/09 showed post-op changes, precarinal LN w/o change, 3mm nodule ant left lung w/o change, scar RML w/o change, thoracic spondy- NAD.Marland Kitchen. ~  CXR 3/10 showed vol loss on left w/ blunt left angle, NAD. ~  CT Chest 10/10 showed clear lungs w/o nodules or infiltrates, stable precarinal LN. ~  CXR 4/11 revealed post op changes, NAD.Marland KitchenMarland Kitchen ~  CT Chest 11/11 showed postop changes, NAD.Marland Kitchen. ~  CXR 2/13 showed heart size at upper limit, calcif tort Ao, s/p surg on left w/ hyperinflation, scarring left base, NAD.Marland KitchenMarland Kitchen  ~  CT Angio Chest 4/13 showed neg for PE, bibasilar atx, one prom periaortic node, divertics in colon...  Hx Chest Pain >> see 4/13 Hosp eval... PALPITATIONS, HX OF (ICD-V12.50) - she takes ASA 81mg /d... notes rare palpit, no CP/ syncope/ dyspnea, edema, etc... ~  NuclearStressTest 12/05 was neg- no scar or ischemia, EF= 70%...  HYPERCHOLESTEROLEMIA (ICD-272.0) - on SIMVASTATIN 40mg /d + low fat diet... ~  FLP 11/07 showed TChol 181, TG -, HDL 48, LDL 95 ~  FLP 1/09 showed TChol 162, TG 198, HDL 43, LDL 80 ~  FLP 2/10 showed TChol 132, TG 144, HDL 41, LDL 62 ~  FLP 2/11 showed TChol 167, TG 271, HDL 57, LDL 90... rec> better low fat diet. ~  FLP 2/12 showed TChol 173, TG 165, HDL 56, LDL 84 ~  FLP 2/13 on Simva40 showed TChol 184, TG  212, HDL 56, LDL 100   HIATAL HERNIA (ICD-553.3), & GERD (ICD-530.81) - she states she is doing fine and does not require PPI therapy... 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 ~  2/11: she notes using occas Mylanta & advised to use Prilosec Prn.  DIVERTICULOSIS OF COLON (ICD-562.10), & IRRITABLE BOWEL SYNDROME (ICD-564.1) - last colonoscopy 3/05 by DrSamLeB showed tortuous colon, divertics, otherw neg...  Hx of UTI'S, RECURRENT (ICD-599.0) - she had recurrent UTI's leading DrGottsegen to refer her to DrPeterson for surg w/ cystocele/ rectocele repairs & sling 10/08... ~  She denies recurrent UTIs & is now followed by Susy Frizzle...  OSTEOARTHRITIS (ICD-715.90) - she has had trigger fingers and several injections by DrSypher... ~  s/p bilat shoulder operations for impingement syndromes in 2001 by DrPaul... ~  She takes TRAMADOL 50mg  prn pain...  BACK PAIN, LUMBAR (ICD-724.2) - she still sees her chiropractor every month for LBP adjustments...  OSTEOPENIA (ICD-733.90) - she states that BMD's are done by DrGottsegen et al and she was prev on Boniva but stopped this in 2010 due to "burning tongue" & ultimately has dental consult at [facility] w/ rec to stop the Bisphos Rx (symptoms did abate somewhat)... she takes Calcium, MVI, VitD, & DrMody has her on Premarin Vag cream weekly... ~  BMD at [facility] 5/08 showed TScores +0.1 in Spine, and -2.0 in left FemNeck (sl worse than 2006) ~  BMD in 8/10 at [facility] showed TScore ?Spine, and -1.9 in FemNeck (sl improved from 2008) ~  Osteopenia followed by GYN & they do her BMDs; she tells me that DrMody said to "just watch it" since she is INTOL to Bisphos rx.  INSOMNIA (ICD-780.52) - prev on Ambien Prn but she indicates that she rests well now w/o need for meds...  Health Maintenance: ~  GYN:  she has a new GYN= DrMody on Premarin cream weekly...   Past Surgical History  Procedure Date  .  Appendectomy   . Abdominal hysterectomy   . Cholecystectomy   . Bilateral shouler operations for impingement syndromes 2001    Dr. Renae Fickle  . Left lower lobectomy 11/2004    via VATS and mini-thoractomy for 1.5cm adenoca of lung by Dr. Edwyna Shell  . Urologic sugery with cystocele/rectocele repairs and sling 03/2007    Dr. Vonita Moss  . Bilateral cataract surgery 2012    Dr. Vonna Kotyk  . Appendectomy   . Lobectomy 11/20/2004    20 % of left lung       Outpatient Encounter Prescriptions as of 11/18/2011  Medication Sig Dispense Refill  . aspirin 81 MG tablet Take 81 mg by mouth daily.      . Calcium Carbonate-Vitamin D (CALCIUM-D) 600-400 MG-UNIT TABS Take 1 tablet by mouth 2 (two) times daily.      Marland Kitchen conjugated estrogens (PREMARIN) vaginal cream Use cream once weekly on Monday night      . Multiple Vitamins-Minerals (CENTRUM SILVER PO) Take 1 tablet by mouth daily.      . nitroGLYCERIN (NITROSTAT) 0.4 MG SL tablet Place 0.4 mg under the tongue every 5 (five) minutes as needed. For chest pain - 2 doses      . simvastatin (ZOCOR) 40 MG tablet Take 1 tablet (40 mg total) by mouth at bedtime.  90 tablet  3  . traMADol (ULTRAM) 50 MG tablet Take 1 tablet (50 mg total) by mouth every 6 (six) hours as needed for pain.  270 tablet  3  . DISCONTD: pantoprazole (PROTONIX) 40 MG tablet Take 1 tablet (40 mg total) by mouth daily at 12 noon.  30 tablet  0    Allergies  Allergen Reactions  . Codeine     Chest pain/tightness    Current Medications, Allergies, Past Medical History, Past Surgical History, Family History, and Social History were reviewed in [employer] record.     Review of Systems         See HPI - all other systems neg except as noted...  The patient denies anorexia, fever, weight loss, weight gain, vision loss, decreased hearing,  hoarseness, chest pain, syncope, dyspnea on exertion, peripheral edema, prolonged cough, headaches, hemoptysis, abdominal pain, melena,  hematochezia, severe indigestion/heartburn, hematuria, incontinence, muscle weakness, suspicious skin lesions, transient blindness, difficulty walking, depression, unusual weight change, abnormal bleeding, enlarged lymph nodes, and angioedema.     Objective:   Physical Exam     WD, WN, 78 y/o WF in NAD... GENERAL:  Alert & oriented; pleasant & cooperative. HEENT:  [place]/AT, EOM-wnl, PERRLA, EACs-clear, TMs-wnl, NOSE-clear, THROAT-clear & wnl. NECK:  Supple w/ fairROM; no JVD; normal carotid impulses w/o bruits; no thyromegaly or nodules palpated; no lymphadenopathy. CHEST:  Clear to P & A; without wheezes/ rales/ or rhonchi; scar from prev surg on left. HEART:  Regular Rhythm; without murmurs/ rubs/ or gallops heard... ABDOMEN:  Soft & nontender; normal bowel sounds; no organomegaly or masses detected. EXT: without deformities, mild arthritic changes; no varicose veins/ venous insuffic/ or edema. NEURO:  CN's intact; motor testing normal; sensory testing normal; gait normal & balance OK. DERM:  No lesions noted; no rash etc...  RADIOLOGY DATA:  Reviewed in the EPIC EMR & discussed w/ the patient...    >>CXR 2/13 showed heart size at upper limit, calcif tort Ao, s/p surg on left w/ hyperinflation, scarring left base, NAD...  LABORATORY DATA:  Reviewed in the EPIC EMR & discussed w/ the patient...    >>LABS 2/13:  FLP- at goals on Simva40 x TG212;  Chems- wnl;  CBC- wnl;  TSH=2.06;  VitD=54;  UA- clear   Assessment:     AR/ Bronchitis>  She uses OTC aqntihist etc as needed; she denies intercurrent bronchitic infections, etc...  Hx Lung Cancer>  Hx as above; routine CXR is neg- no evid for recurrent dis...  Palpit & Hx CP>  She notes intermittent self limited palpit & knows to avoid caffeine, pseudophed, etc; see 4/13 Hosp for CP & neg eval Drcooper...  Hyperlipid>  Chol numbers ok on the Simva40; needs better low dfat diet for the TGs...  GI> HH, GERD, Divertics, IBS>  Hx PUD surg yrs  ago & she takes an occas Prilosec prn; denies IBS symptoms & says BMs are normal; f/u colon due 3/15...  Hx recurrent UTIs in the past; no prob since sling surg 2008...  DJD, LBP>  Doing satis on Tramadol & Chiropractor adjustments Qmo she says...  Osteopenia>  Followed by Susy Frizzle...     Plan:     Patient's Medications  New Prescriptions   No medications on file  Previous Medications   ASPIRIN 81 MG TABLET    Take 81 mg by mouth daily.   CALCIUM CARBONATE-VITAMIN D (CALCIUM-D) 600-400 MG-UNIT TABS    Take 1 tablet by mouth 2 (two) times daily.   CONJUGATED ESTROGENS (PREMARIN) VAGINAL CREAM    Use cream once weekly on Monday night   MULTIPLE VITAMINS-MINERALS (CENTRUM SILVER PO)    Take 1 tablet by mouth daily.   NITROGLYCERIN (NITROSTAT) 0.4 MG SL TABLET    Place 0.4 mg under the tongue every 5 (five) minutes as needed. For chest pain - 2 doses   SIMVASTATIN (ZOCOR) 40 MG TABLET    Take 1 tablet (40 mg total) by mouth at bedtime.   TRAMADOL (ULTRAM) 50 MG TABLET    Take 1 tablet (50 mg total) by mouth every 6 (six) hours as needed for pain.  Modified Medications   No medications on file  Discontinued Medications   PANTOPRAZOLE (PROTONIX) 40 MG TABLET    Take 1 tablet (40 mg  total) by mouth daily at 12 noon.

## 2012-03-13 ENCOUNTER — Encounter: Payer: Self-pay | Admitting: Gastroenterology

## 2012-05-04 ENCOUNTER — Ambulatory Visit (INDEPENDENT_AMBULATORY_CARE_PROVIDER_SITE_OTHER): Payer: Medicare Other

## 2012-05-04 DIAGNOSIS — Z23 Encounter for immunization: Secondary | ICD-10-CM

## 2012-05-12 ENCOUNTER — Other Ambulatory Visit: Payer: Self-pay | Admitting: Dermatology

## 2012-07-27 ENCOUNTER — Telehealth: Payer: Self-pay | Admitting: Pulmonary Disease

## 2012-07-27 DIAGNOSIS — E785 Hyperlipidemia, unspecified: Secondary | ICD-10-CM

## 2012-07-27 DIAGNOSIS — R590 Localized enlarged lymph nodes: Secondary | ICD-10-CM

## 2012-07-27 DIAGNOSIS — K5730 Diverticulosis of large intestine without perforation or abscess without bleeding: Secondary | ICD-10-CM

## 2012-07-27 DIAGNOSIS — K219 Gastro-esophageal reflux disease without esophagitis: Secondary | ICD-10-CM

## 2012-07-27 DIAGNOSIS — K449 Diaphragmatic hernia without obstruction or gangrene: Secondary | ICD-10-CM

## 2012-07-27 DIAGNOSIS — M899 Disorder of bone, unspecified: Secondary | ICD-10-CM

## 2012-07-27 DIAGNOSIS — K589 Irritable bowel syndrome without diarrhea: Secondary | ICD-10-CM

## 2012-07-27 DIAGNOSIS — M949 Disorder of cartilage, unspecified: Secondary | ICD-10-CM

## 2012-07-27 DIAGNOSIS — E7800 Pure hypercholesterolemia, unspecified: Secondary | ICD-10-CM

## 2012-07-27 DIAGNOSIS — M1990 Unspecified osteoarthritis, unspecified site: Secondary | ICD-10-CM

## 2012-07-27 NOTE — Telephone Encounter (Signed)
Lab orders are placed and pt aware nothing further needed.

## 2012-07-27 NOTE — Telephone Encounter (Signed)
Last OV 11/18/11. Please advise what labs pt will need. thanks

## 2012-07-28 ENCOUNTER — Other Ambulatory Visit (INDEPENDENT_AMBULATORY_CARE_PROVIDER_SITE_OTHER): Payer: Medicare Other

## 2012-07-28 DIAGNOSIS — M949 Disorder of cartilage, unspecified: Secondary | ICD-10-CM

## 2012-07-28 DIAGNOSIS — E785 Hyperlipidemia, unspecified: Secondary | ICD-10-CM

## 2012-07-28 DIAGNOSIS — E7800 Pure hypercholesterolemia, unspecified: Secondary | ICD-10-CM

## 2012-07-28 DIAGNOSIS — R590 Localized enlarged lymph nodes: Secondary | ICD-10-CM

## 2012-07-28 DIAGNOSIS — R599 Enlarged lymph nodes, unspecified: Secondary | ICD-10-CM

## 2012-07-28 DIAGNOSIS — M1990 Unspecified osteoarthritis, unspecified site: Secondary | ICD-10-CM

## 2012-07-28 DIAGNOSIS — K219 Gastro-esophageal reflux disease without esophagitis: Secondary | ICD-10-CM

## 2012-07-28 DIAGNOSIS — K449 Diaphragmatic hernia without obstruction or gangrene: Secondary | ICD-10-CM

## 2012-07-28 DIAGNOSIS — M899 Disorder of bone, unspecified: Secondary | ICD-10-CM

## 2012-07-28 LAB — BASIC METABOLIC PANEL
BUN: 16 mg/dL (ref 6–23)
Chloride: 104 mEq/L (ref 96–112)
Creatinine, Ser: 1.1 mg/dL (ref 0.4–1.2)
Glucose, Bld: 99 mg/dL (ref 70–99)
Potassium: 5.1 mEq/L (ref 3.5–5.1)

## 2012-07-28 LAB — CBC WITH DIFFERENTIAL/PLATELET
Eosinophils Relative: 9.5 % — ABNORMAL HIGH (ref 0.0–5.0)
HCT: 37.8 % (ref 36.0–46.0)
Hemoglobin: 12.9 g/dL (ref 12.0–15.0)
Lymphocytes Relative: 22.7 % (ref 12.0–46.0)
Lymphs Abs: 1.5 10*3/uL (ref 0.7–4.0)
Monocytes Relative: 8.3 % (ref 3.0–12.0)
Neutro Abs: 3.9 10*3/uL (ref 1.4–7.7)
RBC: 4.45 Mil/uL (ref 3.87–5.11)
WBC: 6.6 10*3/uL (ref 4.5–10.5)

## 2012-07-28 LAB — LIPID PANEL
Cholesterol: 172 mg/dL (ref 0–200)
HDL: 43.8 mg/dL (ref >39.00)
VLDL: 42.6 mg/dL — ABNORMAL HIGH (ref 0.0–40.0)

## 2012-07-28 LAB — LDL CHOLESTEROL, DIRECT: Direct LDL: 92.4 mg/dL

## 2012-07-28 LAB — HEPATIC FUNCTION PANEL
Albumin: 3.9 g/dL (ref 3.5–5.2)
Total Protein: 7.4 g/dL (ref 6.0–8.3)

## 2012-07-31 ENCOUNTER — Ambulatory Visit: Payer: Medicare Other | Admitting: Pulmonary Disease

## 2012-08-04 ENCOUNTER — Other Ambulatory Visit: Payer: Self-pay | Admitting: Dermatology

## 2012-08-31 ENCOUNTER — Telehealth: Payer: Self-pay | Admitting: Pulmonary Disease

## 2012-08-31 NOTE — Telephone Encounter (Signed)
SN is aware of episode that the pt had.  He wants the pt to keep her appt with SN on 3/24.  thanks

## 2012-08-31 NOTE — Telephone Encounter (Signed)
Called and spoke with pt and she stated that on Saturday she was up getting ready to go to a birthday party and she stated that she all of sudden lost control and was unable to stand on her own, she felt faint and nauseated.  She stated that she did not fall but her husband had to help hold her up.  She stated that this only lasted for a few seconds that she can remember and she stated that she does not seem to remember it very well.  She has not had any problems since.  She did state that her legs felt weak Saturday evening but since then she has been ok.  She is currently on an abx for UTI--nitrofurantion 100 mg  1 po bid.  She has an appt with SN on 3/24 at 2.  Pt stated that she also may have an ear infection.  She wanted to make SN aware of what happened on Saturday but does not feel that she should be seen prior to her appt.  SN please advise if anything further needed.  Thanks  Allergies  Allergen Reactions  . Codeine     Chest pain/tightness

## 2012-08-31 NOTE — Telephone Encounter (Signed)
PT aware of Dr Jodelle Green recommendations and will keep ov on 09/07/12.

## 2012-09-07 ENCOUNTER — Ambulatory Visit (INDEPENDENT_AMBULATORY_CARE_PROVIDER_SITE_OTHER)
Admission: RE | Admit: 2012-09-07 | Discharge: 2012-09-07 | Disposition: A | Discharge status: Home / Self Care | Payer: Medicare Other | Source: Ambulatory Visit | Attending: Pulmonary Disease | Admitting: Pulmonary Disease

## 2012-09-07 ENCOUNTER — Ambulatory Visit (INDEPENDENT_AMBULATORY_CARE_PROVIDER_SITE_OTHER): Payer: Medicare Other | Admitting: Pulmonary Disease

## 2012-09-07 ENCOUNTER — Encounter: Payer: Self-pay | Admitting: Pulmonary Disease

## 2012-09-07 VITALS — BP 140/70 | HR 70 | Temp 97.6°F | Ht 63.5 in | Wt 146.4 lb

## 2012-09-07 DIAGNOSIS — K589 Irritable bowel syndrome without diarrhea: Secondary | ICD-10-CM

## 2012-09-07 DIAGNOSIS — M1990 Unspecified osteoarthritis, unspecified site: Secondary | ICD-10-CM

## 2012-09-07 DIAGNOSIS — C3490 Malignant neoplasm of unspecified part of unspecified bronchus or lung: Secondary | ICD-10-CM

## 2012-09-07 DIAGNOSIS — M545 Low back pain, unspecified: Secondary | ICD-10-CM

## 2012-09-07 DIAGNOSIS — K219 Gastro-esophageal reflux disease without esophagitis: Secondary | ICD-10-CM

## 2012-09-07 DIAGNOSIS — M949 Disorder of cartilage, unspecified: Secondary | ICD-10-CM

## 2012-09-07 DIAGNOSIS — K5730 Diverticulosis of large intestine without perforation or abscess without bleeding: Secondary | ICD-10-CM

## 2012-09-07 DIAGNOSIS — M899 Disorder of bone, unspecified: Secondary | ICD-10-CM

## 2012-09-07 DIAGNOSIS — J309 Allergic rhinitis, unspecified: Secondary | ICD-10-CM

## 2012-09-07 DIAGNOSIS — G4700 Insomnia, unspecified: Secondary | ICD-10-CM

## 2012-09-07 DIAGNOSIS — E7800 Pure hypercholesterolemia, unspecified: Secondary | ICD-10-CM

## 2012-09-07 DIAGNOSIS — N390 Urinary tract infection, site not specified: Secondary | ICD-10-CM

## 2012-09-07 DIAGNOSIS — Z87898 Personal history of other specified conditions: Secondary | ICD-10-CM

## 2012-09-07 MED ORDER — SIMVASTATIN 40 MG PO TABS: 40.0000 mg | ORAL_TABLET | Freq: Every day | ORAL | Status: DC

## 2012-09-07 MED ORDER — ZOLPIDEM TARTRATE 10 MG PO TABS: 10.0000 mg | ORAL_TABLET | Freq: Every evening | ORAL | Status: DC | PRN

## 2012-09-07 MED ORDER — TRAMADOL HCL 50 MG PO TABS: 50.0000 mg | ORAL_TABLET | Freq: Four times a day (QID) | ORAL | Status: DC | PRN

## 2012-09-07 NOTE — Patient Instructions (Addendum)
Today we updated your med list in our EPIC system...    Continue your current medications the same...    We reflled your meds per request...    We also wrote a new prescription for AMBIEN 10mg  (Zolpidem) to use as needed for sleep...  Today we did your follow up CXR...    We will contact you w/ the results when available...   Call for any questions.Marland KitchenMarland Kitchen

## 2012-09-07 NOTE — Progress Notes (Signed)
Subjective:     Patient ID: Krystal Craig, female   DOB: 1932-07-19, 79 y.o.   MRN: [id number]  HPI 79 y/o WF here for a follow up visit... she has multiple medical problems as noted below...   ~  July 31, 2010:  Yearly ROV & still notes the burning tongueprob even w/o the Bisphos rx, pt notes that Prilosec makes it worse;  also c/o recent URI- allergy symptoms sneezing, sinus HA, etc> we discussed Rx w/ Antihist in AM, Saline nasal mist Q1-2H, Flonase Qhs; she recently had bilart cat surg.     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    She remains stable on Simva40 for chol;  GI is stable & she had her last colon 3/05 (tortuous, divertics, no polyps);  DJD & LBP w/ chiropractor rx;  as noted she is off Bisphos meds w/ osteopenia on BMD followed by GYN & [facility]...  ~  August 01, 2011:  Yearly ROV & review> Krystal Craig states that she is doing well, hjad a good yr, no new complaints or concerns...     AR, Hx bronchitis> she uses OTC antihist etc; denies recurrent bronchitic infections w/o cough, sput, hemop, SOB, etc...    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    Hx Palpit> On ASA81; she notes rare palpit, self limited- no CP, dizziness, syncope,etc...    Hyperlipid> On Simva40;  FLP showed TChol 184, TG 212, HDL 56, LDL 100; advised better low fat diet...    HH, GERD> On Prilosec OTC as needed & states she hasn't needed it; denies heartburn, reflux, dysphagia, abd pain, n/v, etc...    Divertics, IBS> she denies abd discomfort, d/c/ blood seen, etc; she will be due for 24yr f/u colon 3/15    Hx recurrent UTIs> none since  DrPeterson did her cystocele, rectocele, & sling surg 10/08; followed by Susy Frizzle now...    DJD, LBP> On Tramadol prn; she manages well, get plenty of exercise, still sees Chiropractor Qmo...    Osteopenia> On Calcium, MVI, VitD; BMDs per gyn- "DrMody said to just watch it"... CXR 2/13 showed heart size at upper limit, calcif tort Ao, s/p surg on left w/ hyperinflation, scarring left base, NAD... LABS 2/13:  FLP- at goals on Simva40 x TG212;  Chems- wnl;  CBC- wnl;  TSH=2.06;  VitD=54;  UA- clear  ~  November 18, 2011:  108mo ROV & post hospital check> Krystal Craig was [facility] 4/9 - 09/25/11 by [facility] w/ CP> she had a hx that sounded like angina; CTAngio showing neg for PE, bibasilar atx, one prom periaortic node, divertics in colon; CXR showed normal heart size & essentially clear lungs w/ surg clips in left hilum;  Neg cardiac enzymes, and normal baseline EKG; LABS were essentially neg as well... She saw DrCooper in office 4/13 & he did Myoview> neg- no ischemia, no infarct, normal wall motion & EF=67%... He told MrsMcGraw that it was prob spasm & she has NTG for prn use if needed... She was disch on Protonix but she says it made her nauseated & she stopped it,  now feels better x for gas & we discussed Simethacone- Mylicon, GasX, etc; also notes some constipation & we reviewed Miralax, Senakot-S... We reviewed prob list, meds, xrays and labs> see below>>  ~  September 07, 2012:  75mo ROV & Krystal Craig reports that Krystal Craig has diagnosed her w/ Psoriasis on her legs and started MTX rx...     AR, Hx bronchitis> she uses OTC antihist etc; denies recurrent bronchitic infections w/o cough, sput, hemop, SOB, etc...    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    Hx Palpit> On ASA81; she notes rare palpit, self limited- she knows to avoid caffeine etc; no CP, dizziness, syncope,etc...    Hyperlipid> On Simva40;  FLP 2/14 showed  TChol 172, TG 213, HDL 44, LDL 92; advised better low fat diet...    HH, GERD> On Prilosec OTC as needed & states she hasn't needed it; denies heartburn, reflux, dysphagia, abd pain, n/v, etc...    Divertics, IBS> on Miralax; she denies abd discomfort, d/c/ blood seen, etc; she will be due for 70yr f/u colon 3/15    Hx recurrent UTIs> none since DrPeterson did her cystocele, rectocele, & sling surg 10/08; followed by Susy Frizzle now on Premarin0.625...    DJD, LBP> On Tramadol prn; she manages well, get plenty of exercise, still sees Chiropractor Qmo...    Osteopenia> On Calcium, MVI, VitD; BMDs per gyn- "DrMody said to just watch it"...    Psoriasis> on MTX2.5-3tabs, 3xper wk;   We reviewed prob list, meds, xrays and labs> see below for updates >> she had the 2013 Flu vaccine 11/13... LABS 2/14:  FLP- ok x TG=213 on Simva40;  Chems- wnl;  CBC- wnl;  TSH=1.63;  VitD=55... CXR 3/14 showed normal heart size, some scarring at left base chronic, postop changes in left hilum, NAD...    PROBLEM LIST:    ALLERGIC RHINITIS (ICD-477.9) - she uses OTC Zyrtek Prn> advised sinus regimen w/ Antihist Qam, Nasal saline mist every 1-2H during the day, Mucinex 1-2 Bid w/ fluids, & Flonase Qhs...  Hx of BRONCHITIS (ICD-490) - no recent URI's or bronchitic infections... she denies cough, sputum, hemoptysis, worsening dyspnea,  wheezing, chest pains, snoring, daytime hypersomnolence, etc...   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 underwent LLLobectomy via VATS & minithoracotomy by DrBurney 6/06 w/ 1.5cm BRONCHOALVEOLAR CELL CANCER & neg nodes... she saw DrMohammed post-op, no adjuvant therapy recommended... she's been followed regularly by DrBurney since surg Q23mo w/ CXR, alt w/ CT scans... ~  CXR 3/09 by DrBurney- no evid of recurrence, left pleural thickening, otherw neg... ~  CT Chest 9/09 showed post-op changes, precarinal LN w/o change, 3mm nodule ant left lung  w/o change, scar RML w/o change, thoracic spondy- NAD.Marland Kitchen. ~  CXR 3/10 showed vol loss on left w/ blunt left angle, NAD. ~  CT Chest 10/10 showed clear lungs w/o nodules or infiltrates, stable precarinal LN. ~  CXR 4/11 revealed post op changes, NAD.Marland KitchenMarland Kitchen ~  CT Chest 11/11 showed postop changes, NAD.Marland Kitchen. ~  CXR 2/13 showed heart size at upper limit, calcif tort Ao, s/p surg on left w/ hyperinflation, scarring left base, NAD.Marland KitchenMarland Kitchen  ~  CT Angio Chest 4/13 showed neg for PE, bibasilar atx, one prom periaortic node, divertics in colon... ~  CXR 3/14 showed normal heart size, some scarring at left base chronic, postop  changes in left hilum, NAD.  Hx Chest Pain >> see 4/13 Hosp eval... PALPITATIONS, HX OF (ICD-V12.50) - she takes ASA 81mg /d... notes rare palpit, no CP/ syncope/ dyspnea, edema, etc... ~  NuclearStressTest 12/05 was neg- no scar or ischemia, EF= 70%... ~  EKG 4/13 showed NSR, rate71, wnl, NAD...  HYPERCHOLESTEROLEMIA (ICD-272.0) - on SIMVASTATIN 40mg /d + low fat diet... ~  FLP 11/07 showed TChol 181, TG -, HDL 48, LDL 95 ~  FLP 1/09 showed TChol 162, TG 198, HDL 43, LDL 80 ~  FLP 2/10 showed TChol 132, TG 144, HDL 41, LDL 62 ~  FLP 2/11 showed TChol 167, TG 271, HDL 57, LDL 90... rec> better low fat diet. ~  FLP 2/12 showed TChol 173, TG 165, HDL 56, LDL 84 ~  FLP 2/13 on Simva40 showed TChol 184, TG 212, HDL 56, LDL 100  ~  FLP 2/14 on Simva40 showed TChol 172, TG 213, HDL 44, LDL 92   HIATAL HERNIA (ICD-553.3), & GERD (ICD-530.81) - she states she is doing fine and does not require PPI therapy... 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 ~  2/11: she notes using occas Mylanta & advised to use Prilosec Prn.  DIVERTICULOSIS OF COLON (ICD-562.10), & IRRITABLE BOWEL SYNDROME (ICD-564.1) - last colonoscopy 3/05 by DrSamLeB showed tortuous colon, divertics, otherw neg...  Hx of UTI'S, RECURRENT (ICD-599.0) - she had recurrent  UTI's leading DrGottsegen to refer her to DrPeterson for surg w/ cystocele/ rectocele repairs & sling 10/08... ~  She denies recurrent UTIs & is now followed by Susy Frizzle...  OSTEOARTHRITIS (ICD-715.90) - she has had trigger fingers and several injections by DrSypher... ~  s/p bilat shoulder operations for impingement syndromes in 2001 by DrPaul... ~  She takes TRAMADOL 50mg  prn pain...  BACK PAIN, LUMBAR (ICD-724.2) - she still sees her chiropractor every month for LBP adjustments...  OSTEOPENIA (ICD-733.90) - she states that BMD's are done by DrGottsegen et al and she was prev on Boniva but stopped this in 2010 due to "burning tongue" & ultimately has dental consult at [facility] w/ rec to stop the Bisphos Rx (symptoms did abate somewhat)... she takes Calcium, MVI, VitD, & DrMody has her on Premarin Vag cream weekly... ~  BMD at [facility] 5/08 showed TScores +0.1 in Spine, and -2.0 in left FemNeck (sl worse than 2006) ~  BMD in 8/10 at [facility] showed TScore ?Spine, and -1.9 in FemNeck (sl improved from 2008) ~  Osteopenia followed by GYN & they do her BMDs; she tells me that DrMody said to "just watch it" since she is INTOL to Bisphos rx.  INSOMNIA (ICD-780.52) - prev on Ambien Prn but she indicates that she rests well now w/o need for meds...  Health Maintenance: ~  GYN:  she has a new GYN= DrMody on Premarin cream weekly...   Past Surgical History  Procedure Laterality Date  . Appendectomy    . Abdominal hysterectomy    . Cholecystectomy    . Bilateral shouler operations for impingement syndromes  2001    Dr. Renae Fickle  . Left lower lobectomy  11/2004    via VATS and mini-thoractomy for 1.5cm adenoca of lung by Dr. Edwyna Shell  . Urologic sugery with cystocele/rectocele repairs and sling  03/2007    Dr. Vonita Moss  . Bilateral cataract surgery  2012    Dr. Vonna Kotyk  . Appendectomy    . Lobectomy  11/20/2004    20 %  of left lung       Outpatient Encounter Prescriptions as of 09/07/2012  Medication Sig  Dispense Refill  . aspirin 81 MG tablet Take 81 mg by mouth daily.      . Calcium Carbonate-Vitamin D (CALCIUM-D) 600-400 MG-UNIT TABS Take 1 tablet by mouth 2 (two) times daily.      Marland Kitchen conjugated estrogens (PREMARIN) vaginal cream Use cream once weekly on Monday night      . Multiple Vitamins-Minerals (CENTRUM SILVER PO) Take 1 tablet by mouth daily.      . nitroGLYCERIN (NITROSTAT) 0.4 MG SL tablet Place 0.4 mg under the tongue every 5 (five) minutes as needed. For chest pain - 2 doses      . simvastatin (ZOCOR) 40 MG tablet Take 1 tablet (40 mg total) by mouth at bedtime.  90 tablet  3  . traMADol (ULTRAM) 50 MG tablet Take 1 tablet (50 mg total) by mouth every 6 (six) hours as needed for pain.  270 tablet  3   No facility-administered encounter medications on file as of 09/07/2012.    Allergies  Allergen Reactions  . Codeine     Chest pain/tightness    Current Medications, Allergies, Past Medical History, Past Surgical History, Family History, and Social History were reviewed in [employer] record.     Review of Systems         See HPI - all other systems neg except as noted...  The patient denies anorexia, fever, weight loss, weight gain, vision loss, decreased hearing, hoarseness, chest pain, syncope, dyspnea on exertion, peripheral edema, prolonged cough, headaches, hemoptysis, abdominal pain, melena, hematochezia, severe indigestion/heartburn, hematuria, incontinence, muscle weakness, suspicious skin lesions, transient blindness, difficulty walking, depression, unusual weight change, abnormal bleeding, enlarged lymph nodes, and angioedema.     Objective:   Physical Exam     WD, WN, 79 y/o WF in NAD... GENERAL:  Alert & oriented; pleasant & cooperative. HEENT:  Flushing/AT, EOM-wnl, PERRLA, EACs-clear, TMs-wnl, NOSE-clear, THROAT-clear & wnl. NECK:  Supple w/ fairROM; no JVD; normal carotid impulses w/o bruits; no thyromegaly or nodules palpated; no  lymphadenopathy. CHEST:  Clear to P & A; without wheezes/ rales/ or rhonchi; scar from prev surg on left. HEART:  Regular Rhythm; without murmurs/ rubs/ or gallops heard... ABDOMEN:  Soft & nontender; normal bowel sounds; no organomegaly or masses detected. EXT: without deformities, mild arthritic changes; no varicose veins/ venous insuffic/ or edema. NEURO:  CN's intact; motor testing normal; sensory testing normal; gait normal & balance OK. DERM:  No lesions noted; no rash etc...  RADIOLOGY DATA:  Reviewed in the EPIC EMR & discussed w/ the patient...    >>CXR 2/13 showed heart size at upper limit, calcif tort Ao, s/p surg on left w/ hyperinflation, scarring left base, NAD...  LABORATORY DATA:  Reviewed in the EPIC EMR & discussed w/ the patient...    >>LABS 2/13:  FLP- at goals on Simva40 x TG212;  Chems- wnl;  CBC- wnl;  TSH=2.06;  VitD=54;  UA- clear   Assessment:      AR/ Bronchitis>  She uses OTC antihist etc as needed; she denies intercurrent bronchitic infections, etc...  Hx Lung Cancer>  Hx as above; routine CXR is neg- no evid for recurrent dis...  Palpit & Hx CP>  She notes intermittent self limited palpit & knows to avoid caffeine, pseudophed, etc; see 4/13 Hosp for CP & neg eval Drcooper...  Hyperlipid>  Chol numbers ok on the Simva40; needs better  low fat diet for the TGs...  GI> HH, GERD, Divertics, IBS>  Hx PUD surg yrs ago & she takes an occas Prilosec prn; denies IBS symptoms & says BMs are normal; f/u colon due 3/15...  Hx recurrent UTIs in the past; no prob since sling surg 2008...  DJD, LBP>  Doing satis on Tramadol & Chiropractor adjustments Qmo she says...  Osteopenia>  Followed by Susy Frizzle...     Plan:     Patient's Medications  New Prescriptions   No medications on file  Previous Medications   ASPIRIN 81 MG TABLET    Take 81 mg by mouth daily.   CALCIUM CARBONATE-VITAMIN D (CALCIUM-D) 600-400 MG-UNIT TABS    Take 1 tablet by mouth 2 (two) times  daily.   CONJUGATED ESTROGENS (PREMARIN) VAGINAL CREAM    Use cream once weekly on Monday night   MULTIPLE VITAMINS-MINERALS (CENTRUM SILVER PO)    Take 1 tablet by mouth daily.   NITROGLYCERIN (NITROSTAT) 0.4 MG SL TABLET    Place 0.4 mg under the tongue every 5 (five) minutes as needed. For chest pain - 2 doses   SIMVASTATIN (ZOCOR) 40 MG TABLET    Take 1 tablet (40 mg total) by mouth at bedtime.   TRAMADOL (ULTRAM) 50 MG TABLET    Take 1 tablet (50 mg total) by mouth every 6 (six) hours as needed for pain.  Modified Medications   No medications on file  Discontinued Medications   No medications on file

## 2012-09-11 ENCOUNTER — Other Ambulatory Visit: Payer: Self-pay | Admitting: Pulmonary Disease

## 2012-11-10 ENCOUNTER — Telehealth: Payer: Self-pay | Admitting: Pulmonary Disease

## 2012-11-10 MED ORDER — SULFAMETHOXAZOLE-TRIMETHOPRIM 800-160 MG PO TABS: 1.0000 | ORAL_TABLET | Freq: Two times a day (BID) | ORAL | Status: DC

## 2012-11-10 NOTE — Telephone Encounter (Signed)
Per SN---  Call in septra ds   #14  1 po bid Next time we will need her to come in for UA and urine culture And possibly send her to urology for further eval.    Called and spoke with pt and she is aware of meds sent in to the pharmacy per SN.  Nothing further is needed.

## 2012-11-10 NOTE — Telephone Encounter (Signed)
Per SN---   cipro 250 mg  #14  1 po bid if recurrent prob then she may need to follow up with GYN.  Called and spoke with pt and she stated that the last time that she took cipro that it messed her stomach up so bad and she is afraid to take this medication again.  She also stated that she called GYN next door and they told her that there was no need for her to have a GYN since she will not need any exams at her age.  They told her that SN could treat her for a UTI is she needed it.  Pt stated that she will come in tomorrow if needed to leave a UA.  SN please advise. Thanks

## 2012-11-10 NOTE — Telephone Encounter (Signed)
Called, spoke with pt.  Reports a burning sensation with urination and an odor to urine since Saturday. No pain in sides or back or f/c/s.  Is taking a OTC urinary pain relief with relief while on the medication.  States she was treated for a UTI in March and is having the same symptoms again.  Requesting recs.  Dr. Kriste Basque, pls advise.  Thank you.  Last OV with SN: 09/07/12; asked to f/u in 6 months. No pending appt  [facility] [address]  Allergies verified with pt: Allergies  Allergen Reactions  . Codeine     Chest pain/tightness

## 2012-11-20 ENCOUNTER — Encounter: Payer: Self-pay | Admitting: Pulmonary Disease

## 2013-05-04 ENCOUNTER — Telehealth: Payer: Self-pay | Admitting: Pulmonary Disease

## 2013-05-04 MED ORDER — TRAMADOL HCL 50 MG PO TABS: 50.0000 mg | ORAL_TABLET | Freq: Four times a day (QID) | ORAL | Status: DC | PRN

## 2013-05-04 NOTE — Telephone Encounter (Signed)
Pt last had tramadol last refilled 09/07/12 #270 x 3 refills. Please advise SN thanks

## 2013-05-04 NOTE — Telephone Encounter (Signed)
rx has been faxed to the mail order pharmacy

## 2013-05-14 ENCOUNTER — Telehealth: Payer: Self-pay | Admitting: Pulmonary Disease

## 2013-05-14 MED ORDER — CIPROFLOXACIN HCL 250 MG PO TABS: 250.0000 mg | ORAL_TABLET | Freq: Two times a day (BID) | ORAL | Status: DC

## 2013-05-14 NOTE — Telephone Encounter (Signed)
Per SN---  Call in cipro 250 mg #14   1 po bid.  thanks

## 2013-05-14 NOTE — Telephone Encounter (Signed)
I called and made pt aware of recs. rx sent to the pharmacy. Nothing further needed

## 2013-05-14 NOTE — Telephone Encounter (Signed)
I called and spoke with pt. Every since Wed afternoon she has been having a burning sensation when urinating, frequent urination, irritation. She is requesting to have something called in for her. Please advise Dr. Kriste Basque thanks  Allergies  Allergen Reactions  . Codeine     Chest pain/tightness

## 2013-08-30 ENCOUNTER — Telehealth: Payer: Self-pay | Admitting: Pulmonary Disease

## 2013-08-30 ENCOUNTER — Other Ambulatory Visit (INDEPENDENT_AMBULATORY_CARE_PROVIDER_SITE_OTHER): Payer: Medicare Other

## 2013-08-30 DIAGNOSIS — R300 Dysuria: Secondary | ICD-10-CM

## 2013-08-30 LAB — URINALYSIS, ROUTINE W REFLEX MICROSCOPIC
Bilirubin Urine: NEGATIVE
KETONES UR: NEGATIVE
NITRITE: POSITIVE — AB
PH: 6.5 (ref 5.0–8.0)
Total Protein, Urine: NEGATIVE
UROBILINOGEN UA: 0.2 (ref 0.0–1.0)
Urine Glucose: NEGATIVE

## 2013-08-30 MED ORDER — CIPROFLOXACIN HCL 500 MG PO TABS: 500.0000 mg | ORAL_TABLET | Freq: Every day | ORAL | Status: DC

## 2013-08-30 MED ORDER — ZOLPIDEM TARTRATE 10 MG PO TABS: 10.0000 mg | ORAL_TABLET | Freq: Every evening | ORAL | Status: DC | PRN

## 2013-08-30 NOTE — Telephone Encounter (Signed)
Pt has called for results of UA and to ask about Rx.

## 2013-08-30 NOTE — Telephone Encounter (Signed)
Per CY - pt has UTI, send in Cipro 500mg  daily for the next 3 days.  Pt is aware. Rx has been sent in. Nothing further was needed.

## 2013-08-30 NOTE — Telephone Encounter (Signed)
Pt c/o burning and frequency on urination for past 3 days.  Denies fever or back pain.  Order placed for ua, c& s.  PT to come have this done today.  Pt would also like to have orders for labs prior to visit on 09/08/12.  Pt will come another day for this because she is not fasting today.  Please advise on orders.

## 2013-09-01 LAB — URINE CULTURE

## 2013-09-02 ENCOUNTER — Telehealth: Payer: Self-pay | Admitting: Pulmonary Disease

## 2013-09-02 DIAGNOSIS — F419 Anxiety disorder, unspecified: Secondary | ICD-10-CM

## 2013-09-02 DIAGNOSIS — K5730 Diverticulosis of large intestine without perforation or abscess without bleeding: Secondary | ICD-10-CM

## 2013-09-02 DIAGNOSIS — Z87898 Personal history of other specified conditions: Secondary | ICD-10-CM

## 2013-09-02 DIAGNOSIS — M949 Disorder of cartilage, unspecified: Secondary | ICD-10-CM

## 2013-09-02 DIAGNOSIS — E7800 Pure hypercholesterolemia, unspecified: Secondary | ICD-10-CM

## 2013-09-02 DIAGNOSIS — M899 Disorder of bone, unspecified: Secondary | ICD-10-CM

## 2013-09-02 MED ORDER — LEVOFLOXACIN 500 MG PO TABS: 500.0000 mg | ORAL_TABLET | Freq: Every day | ORAL | Status: DC

## 2013-09-02 NOTE — Telephone Encounter (Signed)
Called and spoke with pt about her urine culture results per SN.  Pt voiced her understanding and she is aware to stop the cipro and start on the levaquin.  This has been sent to the pts pharmacy and pt is aware.   She will come in for her fasting labs prior to her appt on 3-25 and pt is aware that these have already been placed.  Nothing further is needed.

## 2013-09-06 ENCOUNTER — Other Ambulatory Visit (INDEPENDENT_AMBULATORY_CARE_PROVIDER_SITE_OTHER): Payer: Medicare Other

## 2013-09-06 DIAGNOSIS — M899 Disorder of bone, unspecified: Secondary | ICD-10-CM

## 2013-09-06 DIAGNOSIS — F411 Generalized anxiety disorder: Secondary | ICD-10-CM

## 2013-09-06 DIAGNOSIS — K5730 Diverticulosis of large intestine without perforation or abscess without bleeding: Secondary | ICD-10-CM

## 2013-09-06 DIAGNOSIS — E7800 Pure hypercholesterolemia, unspecified: Secondary | ICD-10-CM

## 2013-09-06 DIAGNOSIS — Z87898 Personal history of other specified conditions: Secondary | ICD-10-CM

## 2013-09-06 DIAGNOSIS — M949 Disorder of cartilage, unspecified: Secondary | ICD-10-CM

## 2013-09-06 DIAGNOSIS — F419 Anxiety disorder, unspecified: Secondary | ICD-10-CM

## 2013-09-06 LAB — CBC WITH DIFFERENTIAL/PLATELET
Basophils Absolute: 0 10*3/uL (ref 0.0–0.1)
Basophils Relative: 0.3 % (ref 0.0–3.0)
EOS PCT: 5.9 % — AB (ref 0.0–5.0)
Eosinophils Absolute: 0.4 10*3/uL (ref 0.0–0.7)
HCT: 38.7 % (ref 36.0–46.0)
Hemoglobin: 12.7 g/dL (ref 12.0–15.0)
Lymphocytes Relative: 21.2 % (ref 12.0–46.0)
Lymphs Abs: 1.4 10*3/uL (ref 0.7–4.0)
MCHC: 33 g/dL (ref 30.0–36.0)
MCV: 88.8 fl (ref 78.0–100.0)
Monocytes Absolute: 0.5 10*3/uL (ref 0.1–1.0)
Monocytes Relative: 7.6 % (ref 3.0–12.0)
NEUTROS PCT: 65 % (ref 43.0–77.0)
Neutro Abs: 4.4 10*3/uL (ref 1.4–7.7)
PLATELETS: 295 10*3/uL (ref 150.0–400.0)
RBC: 4.35 Mil/uL (ref 3.87–5.11)
RDW: 13.8 % (ref 11.5–14.6)
WBC: 6.8 10*3/uL (ref 4.5–10.5)

## 2013-09-06 LAB — LIPID PANEL
CHOL/HDL RATIO: 3
Cholesterol: 150 mg/dL (ref 0–200)
HDL: 46.9 mg/dL (ref >39.00)
LDL CALC: 56 mg/dL (ref 0–99)
TRIGLYCERIDES: 234 mg/dL — AB (ref 0.0–149.0)
VLDL: 46.8 mg/dL — AB (ref 0.0–40.0)

## 2013-09-06 LAB — HEPATIC FUNCTION PANEL
ALBUMIN: 4.4 g/dL (ref 3.5–5.2)
ALK PHOS: 67 U/L (ref 39–117)
ALT: 35 U/L (ref 0–35)
AST: 34 U/L (ref 0–37)
BILIRUBIN DIRECT: 0.2 mg/dL (ref 0.0–0.3)
Total Bilirubin: 1.3 mg/dL — ABNORMAL HIGH (ref 0.3–1.2)
Total Protein: 7.5 g/dL (ref 6.0–8.3)

## 2013-09-06 LAB — BASIC METABOLIC PANEL
BUN: 10 mg/dL (ref 6–23)
CALCIUM: 9.6 mg/dL (ref 8.4–10.5)
CO2: 27 meq/L (ref 19–32)
CREATININE: 1.1 mg/dL (ref 0.4–1.2)
Chloride: 102 mEq/L (ref 96–112)
GFR: 49.96 mL/min — AB (ref >60.00)
Glucose, Bld: 94 mg/dL (ref 70–99)
Potassium: 4.1 mEq/L (ref 3.5–5.1)
SODIUM: 139 meq/L (ref 135–145)

## 2013-09-06 LAB — TSH: TSH: 1.93 u[IU]/mL (ref 0.35–5.50)

## 2013-09-07 LAB — VITAMIN D 25 HYDROXY (VIT D DEFICIENCY, FRACTURES): VIT D 25 HYDROXY: 74 ng/mL (ref 30–89)

## 2013-09-08 ENCOUNTER — Ambulatory Visit (INDEPENDENT_AMBULATORY_CARE_PROVIDER_SITE_OTHER): Payer: Medicare Other | Admitting: Pulmonary Disease

## 2013-09-08 ENCOUNTER — Encounter: Payer: Self-pay | Admitting: Pulmonary Disease

## 2013-09-08 VITALS — BP 132/76 | HR 75 | Temp 98.3°F | Ht 63.5 in | Wt 143.2 lb

## 2013-09-08 DIAGNOSIS — E785 Hyperlipidemia, unspecified: Secondary | ICD-10-CM

## 2013-09-08 DIAGNOSIS — K5730 Diverticulosis of large intestine without perforation or abscess without bleeding: Secondary | ICD-10-CM

## 2013-09-08 DIAGNOSIS — J309 Allergic rhinitis, unspecified: Secondary | ICD-10-CM

## 2013-09-08 DIAGNOSIS — K219 Gastro-esophageal reflux disease without esophagitis: Secondary | ICD-10-CM

## 2013-09-08 DIAGNOSIS — C3490 Malignant neoplasm of unspecified part of unspecified bronchus or lung: Secondary | ICD-10-CM

## 2013-09-08 DIAGNOSIS — Z8679 Personal history of other diseases of the circulatory system: Secondary | ICD-10-CM

## 2013-09-08 DIAGNOSIS — M1990 Unspecified osteoarthritis, unspecified site: Secondary | ICD-10-CM

## 2013-09-08 DIAGNOSIS — M899 Disorder of bone, unspecified: Secondary | ICD-10-CM

## 2013-09-08 DIAGNOSIS — M949 Disorder of cartilage, unspecified: Secondary | ICD-10-CM

## 2013-09-08 DIAGNOSIS — G4700 Insomnia, unspecified: Secondary | ICD-10-CM

## 2013-09-08 DIAGNOSIS — M545 Low back pain, unspecified: Secondary | ICD-10-CM

## 2013-09-08 DIAGNOSIS — Z87898 Personal history of other specified conditions: Secondary | ICD-10-CM

## 2013-09-08 DIAGNOSIS — N390 Urinary tract infection, site not specified: Secondary | ICD-10-CM

## 2013-09-08 DIAGNOSIS — H938X9 Other specified disorders of ear, unspecified ear: Secondary | ICD-10-CM

## 2013-09-08 MED ORDER — SIMVASTATIN 40 MG PO TABS: 40.0000 mg | ORAL_TABLET | Freq: Every day | ORAL | Status: DC

## 2013-09-08 MED ORDER — TRAMADOL HCL 50 MG PO TABS: 50.0000 mg | ORAL_TABLET | Freq: Four times a day (QID) | ORAL | Status: DC | PRN

## 2013-09-08 MED ORDER — ESTROGENS, CONJUGATED 0.625 MG/GM VA CREA: TOPICAL_CREAM | VAGINAL | Status: DC

## 2013-09-08 NOTE — Patient Instructions (Signed)
Today we updated your med list in our EPIC system...    Continue your current medications the same...  Today we reviewed your recent fasting blood work 7 gave you a copy for your records... We also did your follow up CXR...    We will contact you w/ the results when available...   We will arrange for an ENT consult to check the soft tissue mass in your right ear canal...  We will also request a Urology eval for the recurrent UTI & urinary urgency that you note...  Call for any questions or if we can be of service in any way.Marland KitchenMarland Kitchen

## 2013-09-08 NOTE — Progress Notes (Signed)
Subjective:     Patient ID: Krystal Craig, female   DOB: Jun 22, 1932, 80 y.o.   MRN: [id number]  HPI 80 y/o WF here for a follow up visit... she has multiple medical problems as noted below...   ~  July 31, 2010:  Yearly ROV & still notes the burning tongueprob even w/o the Bisphos rx, pt notes that Prilosec makes it worse;  also c/o recent URI- allergy symptoms sneezing, sinus HA, etc> we discussed Rx w/ Antihist in AM, Saline nasal mist Q1-2H, Flonase Qhs; she recently had bilart cat surg.     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    She remains stable on Simva40 for chol;  GI is stable & she had her last colon 3/05 (tortuous, divertics, no polyps);  DJD & LBP w/ chiropractor rx;  as noted she is off Bisphos meds w/ osteopenia on BMD followed by [place]...  ~  August 01, 2011:  Yearly ROV & review> [name] states that she is doing well, hjad a good yr, no new complaints or concerns...     AR, Hx bronchitis> she uses OTC antihist etc; denies recurrent bronchitic infections w/o cough, sput, hemop, SOB, etc...    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    Hx Palpit> On ASA81; she notes rare palpit, self limited- no CP, dizziness, syncope,etc...    Hyperlipid> On Simva40;  FLP showed TChol 184, TG 212, HDL 56, LDL 100; advised better low fat diet...    HH, GERD> On Prilosec OTC as needed & states she hasn't needed it; denies heartburn, reflux, dysphagia, abd pain, n/v, etc...    Divertics, IBS> she denies abd discomfort, d/c/ blood seen, etc; she will be due for 24yr f/u colon 3/15    Hx recurrent UTIs> none since  DrPeterson did her cystocele, rectocele, & sling surg 10/08; followed by Gwendolyn Grant now...    DJD, LBP> On Tramadol prn; she manages well, get plenty of exercise, still sees Chiropractor Qmo...    Osteopenia> On Calcium, MVI, VitD; BMDs per gyn- "DrMody said to just watch it"...  CXR 2/13 showed heart size at upper limit, calcif tort Ao, s/p surg on left w/ hyperinflation, scarring left base, NAD...  LABS 2/13:  FLP- at goals on Simva40 x TG212;  Chems- wnl;  CBC- wnl;  TSH=2.06;  VitD=54;  UA- clear  ~  November 18, 2011:  82mo ROV & post hospital check> [name] was [facility] 4/9 - 09/25/11 by [facility] w/ CP> she had a hx that sounded like angina; CTAngio showing neg for PE, bibasilar atx, one prom periaortic node, divertics in colon; CXR showed normal heart size & essentially clear lungs w/ surg clips in left hilum;  Neg cardiac enzymes, and normal baseline EKG; LABS were essentially neg as well... She saw DrCooper in office 4/13 & he did Myoview> neg- no ischemia, no infarct, normal wall motion & EF=67%... He told MrsMcGraw that it was prob spasm & she has NTG for prn use if needed... She was disch on Protonix but she says it made her nauseated & she  stopped it, now feels better x for gas & we discussed Simethacone- Mylicon, GasX, etc; also notes some constipation & we reviewed Miralax, Senakot-S... We reviewed prob list, meds, xrays and labs> see below>>  ~  September 07, 2012:  15mo ROV & Barb reports that Krystal Craig has diagnosed her w/ Psoriasis on her legs and started MTX rx...    AR, Hx bronchitis> she uses OTC antihist etc; denies recurrent bronchitic infections w/o cough, sput, hemop, SOB, etc...    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    Hx Palpit> On ASA81; she notes rare palpit, self limited- she knows to avoid caffeine etc; no CP, dizziness, syncope,etc...    Hyperlipid> On Simva40;  FLP 2/14  showed TChol 172, TG 213, HDL 44, LDL 92; advised better low fat diet...    HH, GERD> On Prilosec OTC as needed & states she hasn't needed it; denies heartburn, reflux, dysphagia, abd pain, n/v, etc...    Divertics, IBS> on Miralax; she denies abd discomfort, d/c/ blood seen, etc; she will be due for 55yr f/u colon 3/15    Hx recurrent UTIs> none since DrPeterson did her cystocele, rectocele, & sling surg 10/08; followed by Gwendolyn Grant now on Premarin0.625...    DJD, LBP> On Tramadol prn; she manages well, get plenty of exercise, still sees Chiropractor Qmo...    Osteopenia> On Calcium, MVI, VitD; BMDs per gyn- "DrMody said to just watch it"...    Psoriasis> on MTX2.5-3tabs, 3xper wk;  We reviewed prob list, meds, xrays and labs> see below for updates >> she had the 2013 Flu vaccine 11/13...  LABS 2/14:  FLP- ok x TG=213 on Simva40;  Chems- wnl;  CBC- wnl;  TSH=1.63;  VitD=55...  CXR 3/14 showed normal heart size, some scarring at left base chronic, postop changes in left hilum, NAD...  ~  September 08, 2013:  Yearly [place] notes some right ear pressure, discomfort, and wonders about an infection; Exam showed a soft tissue mass occluding the EAC & we will refer to ENT for further eval & Rx... She also reports a "burning tongue" & went to 5 doctors, sent to [facility], no real answer but she chews gum & this helps... We reviewed the following medical problems during today's office visit >>     AR, Hx bronchitis> she uses OTC antihist etc; denies recurrent bronchitic infections w/o cough, sput, hemop, SOB, etc...    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    Hx Palpit> On ASA81; she notes rare palpit, self limited- she knows to avoid caffeine etc; no CP, dizziness, syncope,etc...    Hyperlipid> On Simva40;  FLP 3/15 showed TChol 150, TG 234, HDL 47, LDL 56; advised better low fat diet...     HH, GERD> On Prilosec OTC as needed; denies heartburn, reflux, dysphagia, abd pain, n/v, etc...    Divertics, IBS> on Miralax; she denies abd discomfort, d/c/ blood seen, etc; she will be due for 14yr f/u colon 3/15    Hx recurrent UTIs> none since DrPeterson did her cystocele, rectocele, & sling surg 10/08; followed by Gwendolyn Grant now on Premarin0.625...    DJD, LBP> On Tramadol prn; she manages well, get plenty of exercise, still sees Chiropractor Qmo...    Osteopenia> On Calcium, MVI,  VitD; BMDs per gyn- "DrMody said to just watch it"...    Psoriasis> off  MTX & treated w/ creams by Verlan Friends... We reviewed prob list, meds, xrays and labs> see below for updates >> she had the 2014 flu vaccine 10/14...   LABS 3/15:  FLP- ok on Simva40 x TG=234;  Chems- wnl;  CBC- wnl;  TSH=1.93;  VitD=74...   CXR 3/15 showed norm heart size, post op changes on left, clear lungs, NAD.Marland KitchenMarland Kitchen              PROBLEM LIST:    ALLERGIC RHINITIS (ICD-477.9) - she uses OTC Zyrtek Prn> advised sinus regimen w/ Antihist Qam, Nasal saline mist every 1-2H during the day, Mucinex 1-2 Bid w/ fluids, & Flonase Qhs...  Hx of BRONCHITIS (ICD-490) - no recent URI's or bronchitic infections... she denies cough, sputum, hemoptysis, worsening dyspnea,  wheezing, chest pains, snoring, daytime hypersomnolence, etc...   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 underwent LLLobectomy via VATS & minithoracotomy by DrBurney 6/06 w/ 1.5cm BRONCHOALVEOLAR CELL CANCER & neg nodes... she saw DrMohammed post-op, no adjuvant therapy recommended... she's been followed regularly by DrBurney since surg Q28mo w/ CXR, alt w/ CT scans... ~  CXR 3/09 by DrBurney- no evid of recurrence, left pleural thickening, otherw neg... ~  CT Chest 9/09 showed post-op changes, precarinal LN w/o change, 65mm nodule ant left lung w/o change, scar RML w/o change, thoracic spondy- NAD.Marland Kitchen. ~  CXR 3/10 showed vol loss on left  w/ blunt left angle, NAD. ~  CT Chest 10/10 showed clear lungs w/o nodules or infiltrates, stable precarinal LN. ~  CXR 4/11 revealed post op changes, NAD.Marland KitchenMarland Kitchen ~  CT Chest 11/11 showed postop changes, NAD.Marland Kitchen. ~  CXR 2/13 showed heart size at upper limit, calcif tort Ao, s/p surg on left w/ hyperinflation, scarring left base, NAD.Marland KitchenMarland Kitchen  ~  CT Angio Chest 4/13 showed neg for PE, bibasilar atx, one prom periaortic node, divertics in colon... ~  CXR 3/14 showed normal heart size, some scarring at left base chronic, postop changes in left hilum, NAD. ~  CXR 3/15 showed norm heart size, post op changes on left, clear lungs, NAD...  Hx Chest Pain >> see 4/13 Hosp eval... PALPITATIONS, HX OF (ICD-V12.50) - she takes ASA 81mg /d... notes rare palpit, no CP/ syncope/ dyspnea, edema, etc... ~  NuclearStressTest 12/05 was neg- no scar or ischemia, EF= 70%... ~  EKG 4/13 showed NSR, rate71, wnl, NAD...  HYPERCHOLESTEROLEMIA (ICD-272.0) - on SIMVASTATIN 40mg /d + low fat diet... ~  [place] 11/07 showed TChol 181, TG -, HDL 48, LDL 95 ~  FLP 1/09 showed TChol 162, TG 198, HDL 43, LDL 80 ~  FLP 2/10 showed TChol 132, TG 144, HDL 41, LDL 62 ~  FLP 2/11 showed TChol 167, TG 271, HDL 57, LDL 90... rec> better low fat diet. ~  FLP 2/12 showed TChol 173, TG 165, HDL 56, LDL 84 ~  FLP 2/13 on Simva40 showed TChol 184, TG 212, HDL 56, LDL 100  ~  FLP 2/14 on Simva40 showed TChol 172, TG 213, HDL 44, LDL 92  ~  FLP 3/15 on Simva40 showed TChol 150, TG 234, HDL 47, LDL 56   HIATAL HERNIA (ICD-553.3), & GERD (ICD-530.81) - she states she is doing fine and does not require PPI therapy... 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 ~  2/11: she  notes using occas Mylanta & advised to use Prilosec Prn.  DIVERTICULOSIS OF COLON (ICD-562.10), & IRRITABLE BOWEL SYNDROME (ICD-564.1) - last colonoscopy 3/05 by DrSamLeB showed tortuous colon, divertics, otherw neg... ~   3/15: she is due for 10 yr f/u colonoscopy &we will refer to GI...   Hx of UTI'S, RECURRENT (ICD-599.0) - she had recurrent UTI's leading DrGottsegen to refer her to [facility] for surg w/ cystocele/ rectocele repairs & sling 10/08... ~  She denies recurrent UTIs & is now followed by Gwendolyn Grant...  OSTEOARTHRITIS (ICD-715.90) - she has had trigger fingers and several injections by DrSypher... ~  s/p bilat shoulder operations for impingement syndromes in 2001 by DrPaul... ~  She takes TRAMADOL 50mg  prn pain...  BACK PAIN, LUMBAR (ICD-724.2) - she still sees her chiropractor every month for LBP adjustments...  OSTEOPENIA (ICD-733.90) - she states that BMD's are done by DrGottsegen et al and she was prev on Boniva but stopped this in 2010 due to "burning tongue" & ultimately has dental consult at [facility] w/ rec to stop the Bisphos Rx (symptoms did abate somewhat)... she takes Calcium, MVI, VitD, & DrMody has her on Premarin Vag cream weekly... ~  BMD at [facility] 5/08 showed TScores +0.1 in Spine, and -2.0 in left FemNeck (sl worse than 2006) ~  BMD in 8/10 at [facility] showed [place] ?Spine, and -1.9 in FemNeck (sl improved from 2008) ~  Osteopenia followed by GYN & they do her BMDs; she tells me that DrMody said to "just watch it" since she is INTOL to Bisphos rx.  INSOMNIA (ICD-780.52) - prev on Ambien Prn but she indicates that she rests well now w/o need for meds...  Health Maintenance: ~  GYN:  she has a new GYN= DrMody on Premarin cream weekly...   Past Surgical History  Procedure Laterality Date  . Appendectomy    . Abdominal hysterectomy    . Cholecystectomy    . Bilateral shouler operations for impingement syndromes  2001    Dr. Eddie Dibbles  . Left lower lobectomy  11/2004    via VATS and mini-thoractomy for 1.5cm adenoca of lung by Dr. Arlyce Dice  . Urologic sugery with cystocele/rectocele repairs and sling  03/2007    Dr. Terance Hart  . Bilateral cataract surgery  2012    Dr. Talbert Forest  . Appendectomy     . Lobectomy  11/20/2004    20 % of left lung       Outpatient Encounter Prescriptions as of 09/08/2013  Medication Sig  . aspirin 81 MG tablet Take 81 mg by mouth daily.  . calcipotriene (DOVONOX) 0.005 % cream Apply 1 application topically daily.  . Calcium Carbonate-Vitamin D (CALCIUM-D) 600-400 MG-UNIT TABS Take 1 tablet by mouth 2 (two) times daily.  . clobetasol cream (TEMOVATE) 0.05 % Use for 2 weeks then off for 2 weeks  . conjugated estrogens (PREMARIN) vaginal cream Use cream once weekly on Monday night  . levofloxacin (LEVAQUIN) 500 MG tablet Take 1 tablet (500 mg total) by mouth daily.  . Multiple Vitamins-Minerals (CENTRUM SILVER PO) Take 1 tablet by mouth daily.  . polyethylene glycol (MIRALAX / GLYCOLAX) packet Take 17 g by mouth daily.  . simvastatin (ZOCOR) 40 MG tablet Take 1 tablet (40 mg total) by mouth at bedtime.  . traMADol (ULTRAM) 50 MG tablet Take 1 tablet (50 mg total) by mouth every 6 (six) hours as needed.  . zolpidem (AMBIEN) 10 MG tablet Take 1 tablet (10 mg total) by mouth at bedtime as  needed for sleep.  . [DISCONTINUED] methotrexate (RHEUMATREX) 2.5 MG tablet Take 3 pills per week Caution:Chemotherapy. Protect from light.  . [DISCONTINUED] NITROSTAT 0.4 MG SL tablet TAKE 1 TABLET UNDER THE TONGUE EVERY 5 MINUTES  . [DISCONTINUED] sulfamethoxazole-trimethoprim (SEPTRA DS) 800-160 MG per tablet Take 1 tablet by mouth 2 (two) times daily.    Allergies  Allergen Reactions  . Codeine     Chest pain/tightness    Current Medications, Allergies, Past Medical History, Past Surgical History, Family History, and Social History were reviewed in [employer] record.     Review of Systems         See HPI - all other systems neg except as noted...  The patient denies anorexia, fever, weight loss, weight gain, vision loss, decreased hearing, hoarseness, chest pain, syncope, dyspnea on exertion, peripheral edema, prolonged cough, headaches,  hemoptysis, abdominal pain, melena, hematochezia, severe indigestion/heartburn, hematuria, incontinence, muscle weakness, suspicious skin lesions, transient blindness, difficulty walking, depression, unusual weight change, abnormal bleeding, enlarged lymph nodes, and angioedema.     Objective:   Physical Exam     WD, WN, 80 y/o WF in NAD... GENERAL:  Alert & oriented; pleasant & cooperative. HEENT:  [place]/AT, EOM-wnl, PERRLA, EACs-clear, TMs-wnl, NOSE-clear, THROAT-clear & wnl. NECK:  Supple w/ fairROM; no JVD; normal carotid impulses w/o bruits; no thyromegaly or nodules palpated; no lymphadenopathy. CHEST:  Clear to P & A; without wheezes/ rales/ or rhonchi; scar from prev surg on left. HEART:  Regular Rhythm; without murmurs/ rubs/ or gallops heard... ABDOMEN:  Soft & nontender; normal bowel sounds; no organomegaly or masses detected. EXT: without deformities, mild arthritic changes; no varicose veins/ venous insuffic/ or edema. NEURO:  CN's intact; motor testing normal; sensory testing normal; gait normal & balance OK. DERM:  No lesions noted; no rash etc...  RADIOLOGY DATA:  Reviewed in the EPIC EMR & discussed w/ the patient...    LABORATORY DATA:  Reviewed in the EPIC EMR & discussed w/ the patient...   Assessment:      AR/ Bronchitis>  She uses OTC antihist etc as needed; she denies intercurrent bronchitic infections, etc...  Hx Lung Cancer>  Hx as above; routine CXR is neg- no evid for recurrent dis...  Palpit & Hx CP>  She notes intermittent self limited palpit & knows to avoid caffeine, pseudophed, etc; see 4/13 Hosp for CP & neg eval Drcooper...  Hyperlipid>  Chol numbers ok on the Simva40; needs better low fat diet for the TGs...  GI> HH, GERD, Divertics, IBS>  Hx PUD surg yrs ago & she takes an occas Prilosec prn; denies IBS symptoms & says BMs are normal; f/u colon due 3/15...  Hx recurrent UTIs in the past; no prob since sling surg 2008...  DJD, LBP>  Doing satis on  Tramadol & Chiropractor adjustments Qmo she says...  Osteopenia>  Followed by Gwendolyn Grant...     Plan:     Patient's Medications  New Prescriptions   No medications on file  Previous Medications   ASPIRIN 81 MG TABLET    Take 81 mg by mouth daily.   CALCIPOTRIENE (DOVONOX) 0.005 % CREAM    Apply 1 application topically daily.   CALCIUM CARBONATE-VITAMIN D (CALCIUM-D) 600-400 MG-UNIT TABS    Take 1 tablet by mouth 2 (two) times daily.   CLOBETASOL CREAM (TEMOVATE) 0.05 %    Use for 2 weeks then off for 2 weeks   LEVOFLOXACIN (LEVAQUIN) 500 MG TABLET    Take 1  tablet (500 mg total) by mouth daily.   MULTIPLE VITAMINS-MINERALS (CENTRUM SILVER PO)    Take 1 tablet by mouth daily.   POLYETHYLENE GLYCOL (MIRALAX / GLYCOLAX) PACKET    Take 17 g by mouth daily.   ZOLPIDEM (AMBIEN) 10 MG TABLET    Take 1 tablet (10 mg total) by mouth at bedtime as needed for sleep.  Modified Medications   Modified Medication Previous Medication   CONJUGATED ESTROGENS (PREMARIN) VAGINAL CREAM conjugated estrogens (PREMARIN) vaginal cream      Use cream once weekly on Monday night    Use cream once weekly on Monday night   SIMVASTATIN (ZOCOR) 40 MG TABLET simvastatin (ZOCOR) 40 MG tablet      Take 1 tablet (40 mg total) by mouth at bedtime.    Take 1 tablet (40 mg total) by mouth at bedtime.   TRAMADOL (ULTRAM) 50 MG TABLET traMADol (ULTRAM) 50 MG tablet      Take 1 tablet (50 mg total) by mouth every 6 (six) hours as needed.    Take 1 tablet (50 mg total) by mouth every 6 (six) hours as needed.  Discontinued Medications   METHOTREXATE (RHEUMATREX) 2.5 MG TABLET    Take 3 pills per week Caution:Chemotherapy. Protect from light.   NITROSTAT 0.4 MG SL TABLET    TAKE 1 TABLET UNDER THE TONGUE EVERY 5 MINUTES   SULFAMETHOXAZOLE-TRIMETHOPRIM (SEPTRA DS) 800-160 MG PER TABLET    Take 1 tablet by mouth 2 (two) times daily.

## 2013-09-13 ENCOUNTER — Ambulatory Visit (INDEPENDENT_AMBULATORY_CARE_PROVIDER_SITE_OTHER)
Admission: RE | Admit: 2013-09-13 | Discharge: 2013-09-13 | Disposition: A | Discharge status: Home / Self Care | Payer: Medicare Other | Source: Ambulatory Visit | Attending: Pulmonary Disease | Admitting: Pulmonary Disease

## 2013-09-13 DIAGNOSIS — Z87898 Personal history of other specified conditions: Secondary | ICD-10-CM

## 2013-09-15 HISTORY — PX: OTHER SURGICAL HISTORY: SHX169

## 2013-09-20 ENCOUNTER — Telehealth: Payer: Self-pay | Admitting: Pulmonary Disease

## 2013-09-20 NOTE — Telephone Encounter (Signed)
Called and spoke with pt and she is aware of cxr results per SN.  Pt voiced her understanding and nothing further is needed.

## 2013-09-22 ENCOUNTER — Other Ambulatory Visit: Payer: Self-pay | Admitting: Otolaryngology

## 2013-09-22 DIAGNOSIS — D2320 Other benign neoplasm of skin of unspecified ear and external auricular canal: Secondary | ICD-10-CM

## 2013-09-23 ENCOUNTER — Telehealth: Payer: Self-pay | Admitting: Pulmonary Disease

## 2013-09-23 MED ORDER — TRAMADOL HCL 50 MG PO TABS: 50.0000 mg | ORAL_TABLET | Freq: Three times a day (TID) | ORAL | Status: DC | PRN

## 2013-09-23 NOTE — Telephone Encounter (Signed)
Called and spoke with pt and she is aware of the rx that has been called to the pharmacy for the tramadol to last for 2 wks.  Nothing further is needed.

## 2013-09-23 NOTE — Telephone Encounter (Signed)
Called spoke w/ pt. She reports we called RX for tramadol into [facility]. They messed her order up and it has not been sent out to her yet. Pt reports she is on her last day of tramadol. Pt is wanting to know if we can call in 2 week RX into local pharm. Please advise SN thanks

## 2013-09-27 ENCOUNTER — Other Ambulatory Visit: Payer: Medicare Other

## 2013-09-30 ENCOUNTER — Ambulatory Visit
Admission: RE | Admit: 2013-09-30 | Discharge: 2013-09-30 | Disposition: A | Discharge status: Home / Self Care | Payer: Medicare Other | Source: Ambulatory Visit | Attending: Otolaryngology | Admitting: Otolaryngology

## 2013-09-30 DIAGNOSIS — D2320 Other benign neoplasm of skin of unspecified ear and external auricular canal: Secondary | ICD-10-CM

## 2013-09-30 MED ORDER — IOHEXOL 300 MG/ML  SOLN
75.0000 mL | Freq: Once | INTRAMUSCULAR | Status: AC | PRN
  Administered 2013-09-30: 75 mL via INTRAVENOUS

## 2013-11-01 ENCOUNTER — Encounter: Payer: Self-pay | Admitting: Gastroenterology

## 2013-11-01 ENCOUNTER — Other Ambulatory Visit: Payer: Self-pay | Admitting: Pulmonary Disease

## 2013-11-01 DIAGNOSIS — K5730 Diverticulosis of large intestine without perforation or abscess without bleeding: Secondary | ICD-10-CM

## 2013-11-03 ENCOUNTER — Other Ambulatory Visit (INDEPENDENT_AMBULATORY_CARE_PROVIDER_SITE_OTHER): Payer: Medicare Other

## 2013-11-03 ENCOUNTER — Ambulatory Visit (INDEPENDENT_AMBULATORY_CARE_PROVIDER_SITE_OTHER): Payer: Medicare Other | Admitting: Pulmonary Disease

## 2013-11-03 ENCOUNTER — Telehealth: Payer: Self-pay | Admitting: Pulmonary Disease

## 2013-11-03 DIAGNOSIS — Z01818 Encounter for other preprocedural examination: Secondary | ICD-10-CM

## 2013-11-03 LAB — PROTIME-INR
INR: 0.9 ratio (ref 0.8–1.0)
Prothrombin Time: 10.4 s (ref 9.6–13.1)

## 2013-11-03 LAB — APTT: APTT: 28.1 s (ref 21.7–28.8)

## 2013-11-03 NOTE — Telephone Encounter (Signed)
Pt was seen today by SN for ECG and labs  I spoke with [name] and advised that we have the form and will fill out and fax today  Nothing further needed

## 2013-11-03 NOTE — Telephone Encounter (Signed)
Per SN---  Pt will need to come in for ekg and pt and ptt.  i have ordered the labs for the pt.  She is aware and will come in the office to have the ekg done and labs.  SN will review and we will fax all results to Dr. Thornell Mule for pt to have her surgery that is scheduled in the morning.  Pt has been placed on SN schedule.

## 2013-11-03 NOTE — Telephone Encounter (Signed)
Called spoke with pt. She reports Dr. Mayer Masker office advised her they have tried calling us twice and sent over a surgical fax form for her ear surgery that is scheduled for 6:30 MA tomorrow. She wants to know if we have form or not. Please advise SN thanks

## 2013-11-03 NOTE — Telephone Encounter (Signed)
[name] or [name] [phone].  [name] w/ Dr. Mayer Masker office states that she has spoke w/ our office 3 times, was assured we have the form.  Needs this taken care of ASAP.  Krystal Craig

## 2013-11-04 ENCOUNTER — Other Ambulatory Visit: Payer: Self-pay | Admitting: Otolaryngology

## 2013-11-04 NOTE — Progress Notes (Signed)
Subjective:     Patient ID: Krystal Craig, female   DOB: [birth year], 80 y.o.   MRN: [id number]  HPI Pt is here for pre-op EKG & LABS requested by ENT, DrKraus...   Review of Systems     Objective:   Physical Exam     Assessment:     Pt here for required pre-op tests per DrKraus...    Plan:     OK for surg by DrKraus.Marland KitchenMarland Kitchen

## 2013-11-23 ENCOUNTER — Telehealth: Payer: Self-pay | Admitting: Pulmonary Disease

## 2013-11-23 MED ORDER — SIMVASTATIN 40 MG PO TABS: 40.0000 mg | ORAL_TABLET | Freq: Every day | ORAL | Status: DC

## 2013-11-23 NOTE — Telephone Encounter (Signed)
Pt aware RX has been sent. Nothing further needed 

## 2013-12-23 ENCOUNTER — Encounter: Payer: Medicare Other | Admitting: Gastroenterology

## 2013-12-27 ENCOUNTER — Telehealth: Payer: Self-pay | Admitting: Pulmonary Disease

## 2013-12-27 MED ORDER — TRAMADOL HCL 50 MG PO TABS: 50.0000 mg | ORAL_TABLET | Freq: Three times a day (TID) | ORAL | Status: DC | PRN

## 2013-12-27 NOTE — Telephone Encounter (Signed)
Spoke with [facility] [phone] Rx on file from 08/2013 for Tramadol 50mg  #270 1 po QID prn pain--cancelled d/t different directions/qty Per SN instructions below-- 1 po TID PRN #90 x 0RF Spoke with SN, verified again that patient is supposed to be taking on ly TID PRN (if even at that amount) Will need to get new PCP for further fills.   Nothing further needed.

## 2013-12-27 NOTE — Telephone Encounter (Signed)
Per SN. Okay to call in tramadol 50 mg 1 po TID PRN pain #90

## 2013-12-27 NOTE — Telephone Encounter (Signed)
Called spoke with pt. She reports she was suppose to see a PA w/ [facility] until she received a call from insurance company stating she could not see him. She is still trying to get a new PCP at [facility].  She is asking for a refill on tramadol 90 day supply be sent to [facility].  Last refilled 09/23/13 #42 x 0 refills Please advise SN thanks

## 2014-01-10 ENCOUNTER — Telehealth: Payer: Self-pay | Admitting: Pulmonary Disease

## 2014-01-10 NOTE — Telephone Encounter (Signed)
RX was called into [facility] 12/27/13 1 po TID PRN #90  X 0 refills  I called spoke with pt. She reports she has not received anything from [facility]. She was giving tracking # but reports it is incorrect. Pt reports she is completely out of tramadol now.  I called [facility]-spoke with Sharyn Lull. Was advised this was shipped out to her on 01/03/14. It can take 2-10 business days to receive this. It is showing out to be delivered today.   Called pt back and made her aware. Nothing further needed

## 2014-01-27 ENCOUNTER — Ambulatory Visit (AMBULATORY_SURGERY_CENTER): Payer: Self-pay | Admitting: *Deleted

## 2014-01-27 VITALS — Ht 63.5 in | Wt 145.2 lb

## 2014-01-27 DIAGNOSIS — Z1211 Encounter for screening for malignant neoplasm of colon: Secondary | ICD-10-CM

## 2014-01-27 MED ORDER — MOVIPREP 100 G PO SOLR: ORAL | Status: DC

## 2014-01-27 NOTE — Progress Notes (Signed)
No allergies to eggs or soy. No problems with anesthesia.  Pt given Emmi instructions for colonoscopy  No oxygen use  No diet drug use  

## 2014-01-28 ENCOUNTER — Telehealth: Payer: Self-pay | Admitting: Pulmonary Disease

## 2014-01-28 MED ORDER — TRAMADOL HCL 50 MG PO TABS: 50.0000 mg | ORAL_TABLET | Freq: Three times a day (TID) | ORAL | Status: DC | PRN

## 2014-01-28 NOTE — Telephone Encounter (Signed)
Pt requesting that tramadol Rx be sent to [facility]. Last OV 11/03/13 Last filled 12/27/13 #90 x 0RF to [facility]  Please advise Dr Lenna Gilford. Thanks.

## 2014-01-28 NOTE — Telephone Encounter (Signed)
Per SN---  Ok to refill the tramadol for the pt.  thanks

## 2014-01-28 NOTE — Telephone Encounter (Signed)
Spoke with patient-she is aware that Rx has been sent to [facility] as requested and approved from SN. Nothing more needed at this time.

## 2014-01-31 ENCOUNTER — Telehealth: Payer: Self-pay | Admitting: Pulmonary Disease

## 2014-01-31 NOTE — Telephone Encounter (Signed)
Called [facility] and spoke with [name]. Was advised they did received RX and is being processed.  Pt aware. Nothing further needed

## 2014-02-03 ENCOUNTER — Encounter: Payer: Self-pay | Admitting: Gastroenterology

## 2014-02-06 ENCOUNTER — Telehealth: Payer: Self-pay | Admitting: Gastroenterology

## 2014-02-06 NOTE — Telephone Encounter (Signed)
On call note. She thinks she has another UTI and wants to know whether to proceed with colonoscopy Tuesday. No problem to proceed as long as she does not have a high fever. Advised her to contact her PCP for treatment of her UTI.

## 2014-02-08 ENCOUNTER — Encounter: Payer: Self-pay | Admitting: Pulmonary Disease

## 2014-02-08 ENCOUNTER — Encounter: Payer: Self-pay | Admitting: Gastroenterology

## 2014-02-08 ENCOUNTER — Ambulatory Visit (AMBULATORY_SURGERY_CENTER): Payer: Medicare Other | Admitting: Gastroenterology

## 2014-02-08 VITALS — BP 117/67 | HR 56 | Temp 98.1°F | Resp 25 | Ht 63.5 in | Wt 145.0 lb

## 2014-02-08 DIAGNOSIS — Z1211 Encounter for screening for malignant neoplasm of colon: Secondary | ICD-10-CM

## 2014-02-08 MED ORDER — SODIUM CHLORIDE 0.9 % IV SOLN: 500.0000 mL | INTRAVENOUS | Status: DC

## 2014-02-08 NOTE — Op Note (Signed)
[place]  [address]. [place], [address]   COLONOSCOPY PROCEDURE REPORT  PATIENT: Krystal Craig, Krystal Craig  MR#: [id number] BIRTHDATE: 07-27-1932 , 80  yrs. old GENDER: Female ENDOSCOPIST: Ladene Artist, MD, [facility] REFERRED BY: PROCEDURE DATE:  02/08/2014 PROCEDURE:   Colonoscopy, screening First Screening Colonoscopy - Avg.  risk and is 50 yrs.  old or older - No.  Prior Negative Screening - Now for repeat screening. 10 or more years since last screening  History of Adenoma - Now for follow-up colonoscopy & has been > or = to 3 yrs.  N/A  Polyps Removed Today? No.  Recommend repeat exam, <10 yrs? No. ASA CLASS:   Class II INDICATIONS:average risk screening. MEDICATIONS: MAC sedation, administered by CRNA and propofol (Diprivan) 240mg  IV DESCRIPTION OF PROCEDURE:   After the risks benefits and alternatives of the procedure were thoroughly explained, informed consent was obtained.  A digital rectal exam revealed no abnormalities of the rectum.   The LB [id number]  endoscope was introduced through the anus and advanced to the cecum, which was identified by both the appendix and ileocecal valve. No adverse events experienced with a tortuous colon.   The quality of the prep was adequate after extensive rinsing and suctioning, using MoviPrep The instrument was then slowly withdrawn as the colon was fully examined.  COLON FINDINGS: Mild diverticulosis was noted in the descending colon and sigmoid colon.   The colon was otherwise normal.  There was no diverticulosis, inflammation, polyps or cancers unless previously stated.  Retroflexed views revealed large internal hemorrhoids. The time to cecum=3 minutes 26 seconds.  Withdrawal time=12 minutes 12 seconds.  The scope was withdrawn and the procedure completed. COMPLICATIONS: There were no complications.  ENDOSCOPIC IMPRESSION: 1.   Mild diverticulosis in the descending colon and sigmoid colon 2.   Large  internal hemorrhoids  RECOMMENDATIONS: 1.  High fiber diet with liberal fluid intake. 2.  Given your age, you will not need another colonoscopy for colon cancer screening or polyp surveillance.  These types of tests usually stop around the age 19.  eSigned:  Ladene Artist, MD, [facility] 02/08/2014 11:59 AM

## 2014-02-08 NOTE — Patient Instructions (Signed)
Discharge instructions given with verbal understanding. Handouts on diverticulosis and hemorrhoids. Resume previous medications. YOU HAD AN ENDOSCOPIC PROCEDURE TODAY AT THE [facility]: Refer to the procedure report that was given to you for any specific questions about what was found during the examination.  If the procedure report does not answer your questions, please call your gastroenterologist to clarify.  If you requested that your care partner not be given the details of your procedure findings, then the procedure report has been included in a sealed envelope for you to review at your convenience later.  YOU SHOULD EXPECT: Some feelings of bloating in the abdomen. Passage of more gas than usual.  Walking can help get rid of the air that was put into your GI tract during the procedure and reduce the bloating. If you had a lower endoscopy (such as a colonoscopy or flexible sigmoidoscopy) you may notice spotting of blood in your stool or on the toilet paper. If you underwent a bowel prep for your procedure, then you may not have a normal bowel movement for a few days.  DIET: Your first meal following the procedure should be a light meal and then it is ok to progress to your normal diet.  A half-sandwich or bowl of soup is an example of a good first meal.  Heavy or fried foods are harder to digest and may make you feel nauseous or bloated.  Likewise meals heavy in dairy and vegetables can cause extra gas to form and this can also increase the bloating.  Drink plenty of fluids but you should avoid alcoholic beverages for 24 hours.  ACTIVITY: Your care partner should take you home directly after the procedure.  You should plan to take it easy, moving slowly for the rest of the day.  You can resume normal activity the day after the procedure however you should NOT DRIVE or use heavy machinery for 24 hours (because of the sedation medicines used during the test).    SYMPTOMS TO REPORT  IMMEDIATELY: A gastroenterologist can be reached at any hour.  During normal business hours, 8:30 AM to 5:00 PM Monday through Friday, call [phone].  After hours and on weekends, please call the GI answering service at [phone] who will take a message and have the physician on call contact you.   Following lower endoscopy (colonoscopy or flexible sigmoidoscopy):  Excessive amounts of blood in the stool  Significant tenderness or worsening of abdominal pains  Swelling of the abdomen that is new, acute  Fever of 100F or higher  FOLLOW UP: If any biopsies were taken you will be contacted by phone or by letter within the next 1-3 weeks.  Call your gastroenterologist if you have not heard about the biopsies in 3 weeks.  Our staff will call the home number listed on your records the next business day following your procedure to check on you and address any questions or concerns that you may have at that time regarding the information given to you following your procedure. This is a courtesy call and so if there is no answer at the home number and we have not heard from you through the emergency physician on call, we will assume that you have returned to your regular daily activities without incident.  SIGNATURES/CONFIDENTIALITY: You and/or your care partner have signed paperwork which will be entered into your electronic medical record.  These signatures attest to the fact that that the information above on your After Visit Summary   has been reviewed and is understood.  Full responsibility of the confidentiality of this discharge information lies with you and/or your care-partner. 

## 2014-02-08 NOTE — Progress Notes (Signed)
Report to PACU, RN, vss, BBS= Clear.  

## 2014-02-09 ENCOUNTER — Telehealth: Payer: Self-pay | Admitting: *Deleted

## 2014-02-09 NOTE — Telephone Encounter (Signed)
  Follow up Call-  Call back number 02/08/2014  Post procedure Call Back phone  # [phone] hm  Permission to leave phone message Yes     Patient questions:  Do you have a fever, pain , or abdominal swelling? No. Pain Score  0 *  Have you tolerated food without any problems? Yes.    Have you been able to return to your normal activities? Yes.    Do you have any questions about your discharge instructions: Diet   No. Medications  No. Follow up visit  No.  Do you have questions or concerns about your Care? No.  Actions: * If pain score is 4 or above: No action needed, pain <4.

## 2014-02-10 ENCOUNTER — Telehealth: Payer: Self-pay | Admitting: Pulmonary Disease

## 2014-02-10 ENCOUNTER — Other Ambulatory Visit (INDEPENDENT_AMBULATORY_CARE_PROVIDER_SITE_OTHER): Payer: Medicare Other

## 2014-02-10 DIAGNOSIS — N390 Urinary tract infection, site not specified: Secondary | ICD-10-CM

## 2014-02-10 LAB — URINALYSIS, ROUTINE W REFLEX MICROSCOPIC
BILIRUBIN URINE: NEGATIVE
KETONES UR: NEGATIVE
Nitrite: POSITIVE — AB
Specific Gravity, Urine: <=1.005 — AB (ref 1.000–1.030)
UROBILINOGEN UA: 1 (ref 0.0–1.0)
Urine Glucose: 100 — AB
pH: 5.5 (ref 5.0–8.0)

## 2014-02-10 MED ORDER — CIPROFLOXACIN HCL 250 MG PO TABS: 250.0000 mg | ORAL_TABLET | Freq: Two times a day (BID) | ORAL | Status: DC

## 2014-02-10 NOTE — Telephone Encounter (Signed)
[phone] pt has another appt at 2:00

## 2014-02-10 NOTE — Telephone Encounter (Signed)
Per SN---  After she leaves the sample for the ua and culture we can call in cipro 250 mg  #14  1 po bid.  thanks

## 2014-02-10 NOTE — Telephone Encounter (Signed)
lmomtcb x1 

## 2014-02-10 NOTE — Telephone Encounter (Signed)
Pt c/o burning with urination, frequency, lower back pain.  Denies fever.  Order placed to have UA and culture.  Will await results.

## 2014-02-10 NOTE — Telephone Encounter (Signed)
Spoke with pt and advised of Dr Melvenia Beam recommendations.  Rx for Cipro sent to pharmacy

## 2014-02-13 LAB — URINE CULTURE: Colony Count: >=100000

## 2014-02-14 ENCOUNTER — Other Ambulatory Visit: Payer: Self-pay | Admitting: Otolaryngology

## 2014-02-14 DIAGNOSIS — H905 Unspecified sensorineural hearing loss: Secondary | ICD-10-CM

## 2014-02-14 DIAGNOSIS — H903 Sensorineural hearing loss, bilateral: Secondary | ICD-10-CM

## 2014-02-15 ENCOUNTER — Telehealth: Payer: Self-pay | Admitting: Pulmonary Disease

## 2014-02-16 MED ORDER — LEVOFLOXACIN 500 MG PO TABS: 500.0000 mg | ORAL_TABLET | Freq: Every day | ORAL | Status: DC

## 2014-02-16 NOTE — Telephone Encounter (Signed)
Pt called back and she is aware of results per SN.  Pt is aware to stop the cipro and start on the levaquin.  This has been sent to her pharmacy and she will do the align once daily with activia yogurt.  Nothing further is needed.

## 2014-02-16 NOTE — Telephone Encounter (Signed)
Please notify patient> She called w/ UTI symptoms- we checked UA and C&S and started empiric Cipro250Bid... Cult grew Enterococcus> Levaquin is a better antibiotic for this> REC- stop Cipro, ch to [facility] 500mg  daily x7d, plus Activia & Align daily...   lmomtcb x 1 for the pt.

## 2014-02-23 ENCOUNTER — Ambulatory Visit
Admission: RE | Admit: 2014-02-23 | Discharge: 2014-02-23 | Disposition: A | Discharge status: Home / Self Care | Payer: Medicare Other | Source: Ambulatory Visit | Attending: Otolaryngology | Admitting: Otolaryngology

## 2014-02-23 DIAGNOSIS — H903 Sensorineural hearing loss, bilateral: Secondary | ICD-10-CM

## 2014-02-23 DIAGNOSIS — H905 Unspecified sensorineural hearing loss: Secondary | ICD-10-CM

## 2014-02-23 MED ORDER — GADOBENATE DIMEGLUMINE 529 MG/ML IV SOLN
7.0000 mL | Freq: Once | INTRAVENOUS | Status: AC | PRN
  Administered 2014-02-23: 7 mL via INTRAVENOUS

## 2014-02-28 ENCOUNTER — Encounter: Payer: Self-pay | Admitting: Family Medicine

## 2014-02-28 ENCOUNTER — Ambulatory Visit (INDEPENDENT_AMBULATORY_CARE_PROVIDER_SITE_OTHER): Payer: Medicare Other | Admitting: Family Medicine

## 2014-02-28 VITALS — BP 142/86 | HR 56 | Temp 99.0°F | Wt 146.0 lb

## 2014-02-28 DIAGNOSIS — G4700 Insomnia, unspecified: Secondary | ICD-10-CM

## 2014-02-28 DIAGNOSIS — M1990 Unspecified osteoarthritis, unspecified site: Secondary | ICD-10-CM

## 2014-02-28 DIAGNOSIS — N183 Chronic kidney disease, stage 3 unspecified: Secondary | ICD-10-CM

## 2014-02-28 DIAGNOSIS — N1831 Chronic kidney disease, stage 3a: Secondary | ICD-10-CM | POA: Insufficient documentation

## 2014-02-28 DIAGNOSIS — Z23 Encounter for immunization: Secondary | ICD-10-CM

## 2014-02-28 DIAGNOSIS — E785 Hyperlipidemia, unspecified: Secondary | ICD-10-CM

## 2014-02-28 DIAGNOSIS — L409 Psoriasis, unspecified: Secondary | ICD-10-CM | POA: Insufficient documentation

## 2014-02-28 HISTORY — DX: Chronic kidney disease, stage 3a: N18.31

## 2014-02-28 HISTORY — DX: Chronic kidney disease, stage 3 unspecified: N18.30

## 2014-02-28 MED ORDER — SIMVASTATIN 40 MG PO TABS: 40.0000 mg | ORAL_TABLET | Freq: Every day | ORAL | Status: DC

## 2014-02-28 MED ORDER — TRAMADOL HCL 50 MG PO TABS: 50.0000 mg | ORAL_TABLET | Freq: Three times a day (TID) | ORAL | Status: DC | PRN

## 2014-02-28 NOTE — Patient Instructions (Addendum)
Health Maintenance Due  Topic Date Due  . Tetanus/tdap -Td vs. Tdap today 04/22/1954  . Pneumococcal Polysaccharide -today  04/22/2000  . Influenza Vaccine -november   Lenny Pastel will need to mail this in.   Simvastatin-refilled  See you in 6 months,  Dr. Yong Channel

## 2014-02-28 NOTE — Assessment & Plan Note (Signed)
Well-controlled on tramadol and as needed Tylenol

## 2014-02-28 NOTE — Assessment & Plan Note (Signed)
Blood pressure well controlled at home. Discussed if this becomes elevated would need to consider blood pressure medication. Specifically ACE inhibitor given CkD stage III. Continue to avoid NSAIDs

## 2014-02-28 NOTE — Progress Notes (Signed)
Krystal Reddish, MD Phone: [phone]  Subjective:  Patient presents today to establish care with me as their new primary care provider. Patient was formerly a patient of Dr. Lenna Gilford. Chief complaint-noted.   CKD Stage III -stable No regular nsaids. BP 120/80 at home. Was not aware of diagnosis. Not on ACE inhibitor ROS-no decreased urination, no confusion  Insomnia- stable Well-controlled on Ambien 5 mg. Only takes half a tab ROS-no vivid dreams. No SI HI  Osteoarthritis-stable, well controlled No NSAIDs due to chronic kidney disease. Takes tramadol and well-controlled ROS-no red or swollen joints  The following were reviewed and entered/updated in epic: Past Medical History  Diagnosis Date  . Allergic rhinitis   . History of bronchitis   . Malignant neoplasm of bronchus and lung, unspecified site 2006  . Pure hypercholesterolemia   . Hiatal hernia   . GERD (gastroesophageal reflux disease)   . Diverticulosis of colon   . IBS (irritable bowel syndrome)   . Urinary tract infection, site not specified   . Osteoarthritis   . Lumbar back pain   . Osteopenia   . Insomnia   . Complication of anesthesia   . PONV (postoperative nausea and vomiting)   . Chest pain 09/24/2011  . Ulcer   . DIVERTICULOSIS OF COLON 08/03/2008    Qualifier: Diagnosis of  By: Lenna Gilford MD, Deborra Medina    Patient Active Problem List   Diagnosis Date Noted  . Psoriasis 02/28/2014    Priority: Medium  . CKD (chronic kidney disease), stage III 02/28/2014    Priority: Medium  . Hyperlipidemia 09/25/2011    Priority: Medium  . ADENOCARCINOMA, LUNG 08/03/2008    Priority: Medium  . INSOMNIA 08/03/2008    Priority: Medium  . OSTEOARTHRITIS 06/26/2007    Priority: Medium  . Ear canal mass 09/08/2013    Priority: Low  . Substernal precordial chest pain 09/30/2011    Priority: Low  . Thoracic lymphadenopathy 09/25/2011    Priority: Low  . UTI'S, RECURRENT 08/03/2008    Priority: Low  . OSTEOPENIA  08/03/2008    Priority: Low  . ALLERGIC RHINITIS 06/26/2007    Priority: Low  . GERD 06/26/2007    Priority: Low  . HIATAL HERNIA 06/26/2007    Priority: Low  . Irritable bowel syndrome 06/26/2007    Priority: Low   Past Surgical History  Procedure Laterality Date  . Appendectomy  1969  . Abdominal hysterectomy  1969  . Cholecystectomy  1976  . Bilateral shouler operations for impingement syndromes  2001    Dr. Eddie Dibbles  . Left lower lobectomy  11/2004    via VATS and mini-thoractomy for 1.5cm adenoca of lung by Dr. Arlyce Dice  . Urologic sugery with cystocele/rectocele repairs and sling  03/2007    Dr. Terance Hart  . Bilateral cataract surgery  2012    Dr. Talbert Forest  . Turmor ear Right 09/2013    Family History  Problem Relation Age of Onset  . Colon cancer Neg Hx   . Esophageal cancer Neg Hx   . Pancreatic cancer Neg Hx   . Rectal cancer Neg Hx   . Stomach cancer Neg Hx   . Heart attack      MI age 29  . Dementia    . CVA      age 77    Medications- reviewed and updated Current Outpatient Prescriptions  Medication Sig Dispense Refill  . acetaminophen (TYLENOL) 500 MG tablet Take 500 mg by mouth every 6 (six) hours as needed.      Marland Kitchen  aspirin 81 MG tablet Take 81 mg by mouth daily.      . calcipotriene (DOVONOX) 0.005 % cream Apply 1 application topically daily.      . Calcium Carbonate-Vitamin D (CALCIUM-D) 600-400 MG-UNIT TABS Take 1 tablet by mouth 2 (two) times daily.      . clobetasol cream (TEMOVATE) 0.05 % Use for 2 weeks then off for 2 weeks      . Multiple Vitamins-Minerals (CENTRUM SILVER PO) Take 1 tablet by mouth daily.      . polyethylene glycol (MIRALAX / GLYCOLAX) packet Take 17 g by mouth daily.      . simvastatin (ZOCOR) 40 MG tablet Take 1 tablet (40 mg total) by mouth at bedtime.  90 tablet  1  . simvastatin (ZOCOR) 40 MG tablet Take 1 tablet (40 mg total) by mouth at bedtime.  90 tablet  2  . traMADol (ULTRAM) 50 MG tablet Take 1 tablet (50 mg total) by mouth 3  (three) times daily as needed.  90 tablet  2  . traMADol (ULTRAM) 50 MG tablet Take 1 tablet (50 mg total) by mouth 3 (three) times daily as needed.  90 tablet  2  . conjugated estrogens (PREMARIN) vaginal cream Use cream once weekly on Monday night  42.5 g  1  . zolpidem (AMBIEN) 10 MG tablet Take 1 tablet (10 mg total) by mouth at bedtime as needed for sleep.  30 tablet  5   No current facility-administered medications for this visit.    Allergies-reviewed and updated Allergies  Allergen Reactions  . Codeine     Chest pain/tightness    History   Social History  . Marital Status: Married    Spouse Name: N/A    Number of Children: N/A  . Years of Education: N/A   Social History Main Topics  . Smoking status: Former Smoker -- 1.00 packs/day for 30 years    Types: Cigarettes    Quit date: 06/17/1984  . Smokeless tobacco: Never Used  . Alcohol Use: No  . Drug Use: No  . Sexual Activity: Not Currently    Birth Control/ Protection: Post-menopausal   Other Topics Concern  . None   Social History Narrative   Married (2nd marriage-1st husband died at age 45) married 62 29 years in 05/2014. 2 sons from first marriage, 3 from 2nd (step). 15 grandchildren.       Retired in 2008 from [employer]: reading, golf, family time    ROS--See HPI   Objective: BP 142/86  Pulse 56  Temp(Src) 99 F (37.2 C)  Wt 146 lb (66.225 kg) Gen: NAD, resting comfortably in chair, appears younger than stated age 20: Mucous membranes are moist. Oropharynx normal Neck: no thyromegaly CV: RRR no murmurs rubs or gallops Lungs: CTAB no crackles, wheeze, rhonchi Abdomen: soft/nontender/nondistended/normal bowel sounds.  Skin: warm, dry  Assessment/Plan:  INSOMNIA Well-controlled. Counseled patient to only take half pill of Ambien 10 mg  CKD (chronic kidney disease), stage III Blood pressure well controlled at home. Discussed if this becomes elevated would need to consider  blood pressure medication. Specifically ACE inhibitor given CkD stage III. Continue to avoid NSAIDs  OSTEOARTHRITIS Well-controlled on tramadol and as needed Tylenol   Health Maintenance Due  Topic Date Due  . Tetanus/tdap -Td vs. Tdap today 04/22/1954  . Pneumococcal Polysaccharide -today  04/22/2000  . Influenza Vaccine -november 01/15/2014    Orders Placed This Encounter  Procedures  . Tetanus  vaccine IM   Sent in mail pharmacy Meds ordered this encounter  Medications  . traMADol (ULTRAM) 50 MG tablet    Sig: Take 1 tablet (50 mg total) by mouth 3 (three) times daily as needed.    Dispense:  90 tablet    Refill:  2    [employer]  .Marland Kitchen.DAW .Marland KitchenMarland KitchenMarland KitchenDX  LOW BACK PAIN, LUNG CANCER, DJD  . simvastatin (ZOCOR) 40 MG tablet    Sig: Take 1 tablet (40 mg total) by mouth at bedtime.    Dispense:  90 tablet    Refill:  2

## 2014-02-28 NOTE — Assessment & Plan Note (Addendum)
Well-controlled. Counseled patient to only take half pill of Ambien 10 mg

## 2014-03-01 ENCOUNTER — Telehealth: Payer: Self-pay | Admitting: Family Medicine

## 2014-03-01 NOTE — Telephone Encounter (Signed)
Pt states [facility] has not received the traMADol (ULTRAM) 50 MG tablet Could you resend or check on that

## 2014-03-01 NOTE — Telephone Encounter (Signed)
[facility] does have Rx and is filling it,pt notified.

## 2014-03-07 MED ORDER — TRAMADOL HCL 50 MG PO TABS: 50.0000 mg | ORAL_TABLET | Freq: Three times a day (TID) | ORAL | Status: DC | PRN

## 2014-03-07 NOTE — Addendum Note (Signed)
Addended by: Marin Olp on: 03/07/2014 03:10 PM   Modules accepted: Orders

## 2014-05-02 ENCOUNTER — Ambulatory Visit (INDEPENDENT_AMBULATORY_CARE_PROVIDER_SITE_OTHER): Payer: Medicare Other | Admitting: Family Medicine

## 2014-05-02 DIAGNOSIS — Z23 Encounter for immunization: Secondary | ICD-10-CM

## 2014-07-14 ENCOUNTER — Ambulatory Visit (INDEPENDENT_AMBULATORY_CARE_PROVIDER_SITE_OTHER): Payer: Medicare Other | Admitting: Family Medicine

## 2014-07-14 ENCOUNTER — Encounter: Payer: Self-pay | Admitting: Family Medicine

## 2014-07-14 VITALS — BP 134/78 | HR 70 | Temp 98.0°F | Wt 145.0 lb

## 2014-07-14 DIAGNOSIS — N3001 Acute cystitis with hematuria: Secondary | ICD-10-CM

## 2014-07-14 DIAGNOSIS — R300 Dysuria: Secondary | ICD-10-CM

## 2014-07-14 LAB — POCT URINALYSIS DIPSTICK
BILIRUBIN UA: NEGATIVE
Glucose, UA: NEGATIVE
Ketones, UA: NEGATIVE
Nitrite, UA: POSITIVE
Spec Grav, UA: <=1.005
UROBILINOGEN UA: 0.2
pH, UA: 5.5

## 2014-07-14 MED ORDER — CEPHALEXIN 500 MG PO CAPS: 500.0000 mg | ORAL_CAPSULE | Freq: Three times a day (TID) | ORAL | Status: DC

## 2014-07-14 NOTE — Patient Instructions (Signed)

## 2014-07-14 NOTE — Progress Notes (Signed)
   Subjective:    Patient ID: Krystal Craig, female    DOB: July 31, 1932, 81 y.o.   MRN: [id number]  HPI Acute visit. Patient is seen with 2 day history of urine frequency and burning with urination. She has had some minimal lower back pain. No flank pain. No nausea or vomiting. No fevers or chills. Frequent UTIs in the past. No known drug allergies  Past Medical History  Diagnosis Date  . Allergic rhinitis   . History of bronchitis   . Malignant neoplasm of bronchus and lung, unspecified site 2006  . Pure hypercholesterolemia   . Hiatal hernia   . GERD (gastroesophageal reflux disease)   . Diverticulosis of colon   . IBS (irritable bowel syndrome)   . Urinary tract infection, site not specified   . Osteoarthritis   . Lumbar back pain   . Osteopenia   . Insomnia   . Complication of anesthesia   . PONV (postoperative nausea and vomiting)   . Chest pain 09/24/2011  . Ulcer   . DIVERTICULOSIS OF COLON 08/03/2008    Qualifier: Diagnosis of  By: Lenna Gilford MD, Deborra Medina    Past Surgical History  Procedure Laterality Date  . Appendectomy  1969  . Abdominal hysterectomy  1969  . Cholecystectomy  1976  . Bilateral shouler operations for impingement syndromes  2001    Dr. Eddie Dibbles  . Left lower lobectomy  11/2004    via VATS and mini-thoractomy for 1.5cm adenoca of lung by Dr. Arlyce Dice  . Urologic sugery with cystocele/rectocele repairs and sling  03/2007    Dr. Terance Hart  . Bilateral cataract surgery  2012    Dr. Talbert Forest  . Turmor ear Right 09/2013    reports that she quit smoking about 30 years ago. Her smoking use included Cigarettes. She has a 30 pack-year smoking history. She has never used smokeless tobacco. She reports that she does not drink alcohol or use illicit drugs. family history includes CVA in an other family member; Dementia in an other family member; Heart attack in an other family member. There is no history of Colon cancer, Esophageal cancer, Pancreatic cancer, Rectal cancer, or  Stomach cancer. Allergies  Allergen Reactions  . Codeine     Chest pain/tightness      Review of Systems  Constitutional: Negative for fever, chills and appetite change.  Gastrointestinal: Negative for nausea, vomiting, abdominal pain, diarrhea and constipation.  Genitourinary: Positive for dysuria and frequency.  Musculoskeletal: Negative for back pain.  Neurological: Negative for dizziness.       Objective:   Physical Exam  Constitutional: She appears well-developed and well-nourished.  HENT:  Head: Normocephalic and atraumatic.  Neck: Neck supple. No thyromegaly present.  Cardiovascular: Normal rate, regular rhythm and normal heart sounds.   Pulmonary/Chest: Breath sounds normal.  Abdominal: Soft. Bowel sounds are normal. There is no tenderness.          Assessment & Plan:  Probable uncomplicated cystitis. Keflex 500 mg 3 times a day for 7 days. Urine culture sent. Stay well-hydrated. Follow-up as needed

## 2014-07-14 NOTE — Progress Notes (Signed)
Pre visit review using our clinic review tool, if applicable. No additional management support is needed unless otherwise documented below in the visit note. 

## 2014-07-17 LAB — URINE CULTURE

## 2014-07-25 ENCOUNTER — Other Ambulatory Visit: Payer: Self-pay

## 2014-07-25 NOTE — Telephone Encounter (Signed)
Yes x 6 months 

## 2014-07-25 NOTE — Telephone Encounter (Signed)
Rx request for Tramadol HCL 50 mg tablet-Take 1 tablet three times a day #90  Pharm: [facility]   Pls advise.

## 2014-07-25 NOTE — Telephone Encounter (Signed)
Is this ok to refill?  

## 2014-07-26 MED ORDER — TRAMADOL HCL 50 MG PO TABS: 50.0000 mg | ORAL_TABLET | Freq: Three times a day (TID) | ORAL | Status: DC | PRN

## 2014-07-26 NOTE — Telephone Encounter (Signed)
Medication faxed to [facility] 

## 2014-07-26 NOTE — Telephone Encounter (Signed)
Medication faxed to [facility]

## 2014-08-29 ENCOUNTER — Ambulatory Visit: Payer: Medicare Other | Admitting: Family Medicine

## 2014-09-05 ENCOUNTER — Other Ambulatory Visit (INDEPENDENT_AMBULATORY_CARE_PROVIDER_SITE_OTHER): Payer: Medicare Other

## 2014-09-05 DIAGNOSIS — Z0000 Encounter for general adult medical examination without abnormal findings: Secondary | ICD-10-CM

## 2014-09-05 LAB — CBC WITH DIFFERENTIAL/PLATELET
BASOS ABS: 0 10*3/uL (ref 0.0–0.1)
Basophils Relative: 0.4 % (ref 0.0–3.0)
EOS PCT: 10.3 % — AB (ref 0.0–5.0)
Eosinophils Absolute: 0.7 10*3/uL (ref 0.0–0.7)
HCT: 37.6 % (ref 36.0–46.0)
Hemoglobin: 12.6 g/dL (ref 12.0–15.0)
Lymphocytes Relative: 19.7 % (ref 12.0–46.0)
Lymphs Abs: 1.3 10*3/uL (ref 0.7–4.0)
MCHC: 33.4 g/dL (ref 30.0–36.0)
MCV: 87.8 fl (ref 78.0–100.0)
MONOS PCT: 8.1 % (ref 3.0–12.0)
Monocytes Absolute: 0.5 10*3/uL (ref 0.1–1.0)
NEUTROS PCT: 61.5 % (ref 43.0–77.0)
Neutro Abs: 4.2 10*3/uL (ref 1.4–7.7)
Platelets: 260 10*3/uL (ref 150.0–400.0)
RBC: 4.28 Mil/uL (ref 3.87–5.11)
RDW: 13.7 % (ref 11.5–15.5)
WBC: 6.8 10*3/uL (ref 4.0–10.5)

## 2014-09-05 LAB — LIPID PANEL
Cholesterol: 172 mg/dL (ref 0–200)
HDL: 51.9 mg/dL (ref >39.00)
NONHDL: 120.1
Total CHOL/HDL Ratio: 3
Triglycerides: 237 mg/dL — ABNORMAL HIGH (ref 0.0–149.0)
VLDL: 47.4 mg/dL — AB (ref 0.0–40.0)

## 2014-09-05 LAB — HEPATIC FUNCTION PANEL
ALT: 26 U/L (ref 0–35)
AST: 30 U/L (ref 0–37)
Albumin: 4.2 g/dL (ref 3.5–5.2)
Alkaline Phosphatase: 77 U/L (ref 39–117)
BILIRUBIN TOTAL: 0.7 mg/dL (ref 0.2–1.2)
Bilirubin, Direct: 0.1 mg/dL (ref 0.0–0.3)
Total Protein: 7 g/dL (ref 6.0–8.3)

## 2014-09-05 LAB — BASIC METABOLIC PANEL
BUN: 15 mg/dL (ref 6–23)
CO2: 25 mEq/L (ref 19–32)
CREATININE: 1.02 mg/dL (ref 0.40–1.20)
Calcium: 9.5 mg/dL (ref 8.4–10.5)
Chloride: 105 mEq/L (ref 96–112)
GFR: 55.51 mL/min — AB (ref >60.00)
Glucose, Bld: 90 mg/dL (ref 70–99)
Potassium: 4.3 mEq/L (ref 3.5–5.1)
Sodium: 139 mEq/L (ref 135–145)

## 2014-09-05 LAB — POCT URINALYSIS DIPSTICK
BILIRUBIN UA: NEGATIVE
Glucose, UA: NEGATIVE
Ketones, UA: NEGATIVE
LEUKOCYTES UA: NEGATIVE
Nitrite, UA: NEGATIVE
PH UA: 5.5
Protein, UA: NEGATIVE
Spec Grav, UA: <=1.005
UROBILINOGEN UA: 0.2

## 2014-09-05 LAB — LDL CHOLESTEROL, DIRECT: Direct LDL: 97 mg/dL

## 2014-09-05 LAB — TSH: TSH: 2.14 u[IU]/mL (ref 0.35–4.50)

## 2014-09-12 ENCOUNTER — Ambulatory Visit
Admission: RE | Admit: 2014-09-12 | Discharge: 2014-09-12 | Disposition: A | Discharge status: Home / Self Care | Payer: Medicare Other | Source: Ambulatory Visit | Attending: Family Medicine | Admitting: Family Medicine

## 2014-09-12 ENCOUNTER — Ambulatory Visit: Payer: Medicare Other | Admitting: Family Medicine

## 2014-09-12 ENCOUNTER — Encounter: Payer: Self-pay | Admitting: Family Medicine

## 2014-09-12 ENCOUNTER — Ambulatory Visit (INDEPENDENT_AMBULATORY_CARE_PROVIDER_SITE_OTHER): Payer: Medicare Other | Admitting: Family Medicine

## 2014-09-12 VITALS — BP 130/70 | HR 85 | Temp 98.3°F | Wt 148.0 lb

## 2014-09-12 DIAGNOSIS — N898 Other specified noninflammatory disorders of vagina: Secondary | ICD-10-CM

## 2014-09-12 DIAGNOSIS — M159 Polyosteoarthritis, unspecified: Secondary | ICD-10-CM

## 2014-09-12 DIAGNOSIS — Z85118 Personal history of other malignant neoplasm of bronchus and lung: Secondary | ICD-10-CM

## 2014-09-12 DIAGNOSIS — Z0000 Encounter for general adult medical examination without abnormal findings: Secondary | ICD-10-CM | POA: Diagnosis not present

## 2014-09-12 DIAGNOSIS — G4700 Insomnia, unspecified: Secondary | ICD-10-CM

## 2014-09-12 DIAGNOSIS — R3129 Other microscopic hematuria: Secondary | ICD-10-CM

## 2014-09-12 DIAGNOSIS — E785 Hyperlipidemia, unspecified: Secondary | ICD-10-CM

## 2014-09-12 HISTORY — DX: Other microscopic hematuria: R31.29

## 2014-09-12 MED ORDER — TRAMADOL HCL 50 MG PO TABS: 50.0000 mg | ORAL_TABLET | Freq: Three times a day (TID) | ORAL | Status: DC | PRN

## 2014-09-12 MED ORDER — ESTROGENS, CONJUGATED 0.625 MG/GM VA CREA: TOPICAL_CREAM | VAGINAL | Status: DC

## 2014-09-12 MED ORDER — SIMVASTATIN 40 MG PO TABS: 40.0000 mg | ORAL_TABLET | Freq: Every day | ORAL | Status: DC

## 2014-09-12 NOTE — Assessment & Plan Note (Signed)
Check chest x-ray. Refer to Dr. Lenna Gilford for yearly follow-up and long-term recommendations

## 2014-09-12 NOTE — Assessment & Plan Note (Signed)
I asked patient to look into alternative such as trazodone 50mg , ambien 5mg , doxepin 3 or 6mg . trazodone would be my preferred option.

## 2014-09-12 NOTE — Assessment & Plan Note (Signed)
Refill tramadol for 6 months

## 2014-09-12 NOTE — Progress Notes (Signed)
Garret Reddish, MD Phone: [phone]  Subjective:  Patient presents today for their annual physical. Chief complaint-noted.   Patient does not exercise due to her arthritic pains. She would like to trial alternatives other than walking. Her insurance will no longer cover Ambien 10 mg that she is taking half of. She requests other options. Sleep onset is the main issue. She is doing well with her simvastatin and her cholesterol is at goal. Her arthritic pains in her hands, wrists, back, neck pain- control with tramadol 50 mg 3 times a day.  History lung cancer. Dr. Lenna Gilford was followed yearly chest x-rays. Patient request to get a chest x-ray and follow-up with Dr. Lenna Gilford.   Endometriosis-uterus removed. Ovaries remain. Took cervix. Does not see ob/gyn-made an appointment as still having hot flashes. She does use Premarin cream for vaginal dryness.  ROS- full  review of systems was completed and negative except for: hot flashes for many years formerly on estrogen until she was taken off and has continued to have hot flashes   The following were reviewed and entered/updated in epic: Past Medical History  Diagnosis Date  . Allergic rhinitis   . History of bronchitis   . Malignant neoplasm of bronchus and lung, unspecified site 2006  . Pure hypercholesterolemia   . Hiatal hernia   . GERD (gastroesophageal reflux disease)   . Diverticulosis of colon   . IBS (irritable bowel syndrome)   . Urinary tract infection, site not specified   . Osteoarthritis   . Lumbar back pain   . Osteopenia   . Insomnia   . Complication of anesthesia   . PONV (postoperative nausea and vomiting)   . Chest pain 09/24/2011  . Ulcer   . DIVERTICULOSIS OF COLON 08/03/2008    Qualifier: Diagnosis of  By: Lenna Gilford MD, Deborra Medina    Patient Active Problem List   Diagnosis Date Noted  . Psoriasis 02/28/2014    Priority: Medium  . CKD (chronic kidney disease), stage III 02/28/2014    Priority: Medium  . Hyperlipidemia  09/25/2011    Priority: Medium  . Adenocarcinoma of lung 08/03/2008    Priority: Medium  . Insomnia 08/03/2008    Priority: Medium  . Osteoarthritis 06/26/2007    Priority: Medium  . Vaginal dryness 09/12/2014    Priority: Low  . Microscopic hematuria 09/12/2014    Priority: Low  . Ear canal mass 09/08/2013    Priority: Low  . Substernal precordial chest pain 09/30/2011    Priority: Low  . Thoracic lymphadenopathy 09/25/2011    Priority: Low  . UTI'S, RECURRENT 08/03/2008    Priority: Low  . OSTEOPENIA 08/03/2008    Priority: Low  . ALLERGIC RHINITIS 06/26/2007    Priority: Low  . GERD 06/26/2007    Priority: Low  . HIATAL HERNIA 06/26/2007    Priority: Low  . Irritable bowel syndrome 06/26/2007    Priority: Low   Past Surgical History  Procedure Laterality Date  . Appendectomy  1969  . Abdominal hysterectomy  1969  . Cholecystectomy  1976  . Bilateral shouler operations for impingement syndromes  2001    Dr. Eddie Dibbles  . Left lower lobectomy  11/2004    via VATS and mini-thoractomy for 1.5cm adenoca of lung by Dr. Arlyce Dice  . Urologic sugery with cystocele/rectocele repairs and sling  03/2007    Dr. Terance Hart  . Bilateral cataract surgery  2012    Dr. Talbert Forest  . Turmor ear Right 09/2013    Family History  Problem Relation Age of Onset  . Colon cancer Neg Hx   . Esophageal cancer Neg Hx   . Pancreatic cancer Neg Hx   . Rectal cancer Neg Hx   . Stomach cancer Neg Hx   . Heart attack      MI age 52  . Dementia    . CVA      age 10    Medications- reviewed and updated Current Outpatient Prescriptions  Medication Sig Dispense Refill  . acetaminophen (TYLENOL) 500 MG tablet Take 500 mg by mouth every 6 (six) hours as needed.    Marland Kitchen aspirin 81 MG tablet Take 81 mg by mouth daily.    . Calcium Carbonate-Vitamin D (CALCIUM-D) 600-400 MG-UNIT TABS Take 1 tablet by mouth 2 (two) times daily.    Marland Kitchen conjugated estrogens (PREMARIN) vaginal cream Use cream once weekly on Monday  night 42.5 g 1  . Multiple Vitamins-Minerals (CENTRUM SILVER PO) Take 1 tablet by mouth daily.    . polyethylene glycol (MIRALAX / GLYCOLAX) packet Take 17 g by mouth daily.    . simvastatin (ZOCOR) 40 MG tablet Take 1 tablet (40 mg total) by mouth at bedtime. 90 tablet 2  . traMADol (ULTRAM) 50 MG tablet Take 1 tablet (50 mg total) by mouth 3 (three) times daily as needed. To be called in. 90 tablet 5  . traMADol (ULTRAM) 50 MG tablet Take 1 tablet (50 mg total) by mouth 3 (three) times daily as needed. To be called in. 270 tablet 5  . zolpidem (AMBIEN) 10 MG tablet Take 1 tablet (10 mg total) by mouth at bedtime as needed for sleep. 30 tablet 5   No current facility-administered medications for this visit.    Allergies-reviewed and updated Allergies  Allergen Reactions  . Codeine     Chest pain/tightness    History   Social History  . Marital Status: Married    Spouse Name: N/A  . Number of Children: N/A  . Years of Education: N/A   Social History Main Topics  . Smoking status: Former Smoker -- 1.00 packs/day for 30 years    Types: Cigarettes    Quit date: 06/17/1984  . Smokeless tobacco: Never Used  . Alcohol Use: No  . Drug Use: No  . Sexual Activity: Not Currently    Birth Control/ Protection: Post-menopausal   Other Topics Concern  . None   Social History Narrative   Married (2nd marriage-1st husband died at age 20) married 75 29 years in 05/2014. 2 sons from first marriage, 3 from 2nd (step). 15 grandchildren.       Retired in 2008 from [employer]: reading, golf, family time    ROS--See HPI   Objective: BP 130/70 mmHg  Pulse 85  Temp(Src) 98.3 F (36.8 C)  Wt 148 lb (67.132 kg) Gen: NAD, resting comfortably HEENT: Mucous membranes are moist. Oropharynx normal.  Neck: no thyromegaly, no lymphadenopathy CV: RRR no murmurs rubs or gallops Lungs: CTAB no crackles, wheeze, rhonchi Abdomen: soft/nontender/nondistended/normal bowel sounds.  No rebound or guarding.  Ext: no edema, 2+ PT pulses Skin: warm, dry, no rash  Neuro: grossly normal, moves all extremities, PERRLA   Assessment/Plan:  81 y.o. female presenting for annual physical.  Health Maintenance counseling: 1. Anticipatory guidance: Patient counseled regarding regular dental exams every 6 months, eye exams yearly,, wearing seatbelts, wearing sunscreen. Sees dermatology at least every other year 2. Risk factor reduction:  Advised patient of need for  regular exercise (trial water aerobics with history arthritis) and diet rich and fruits and vegetables to reduce risk of heart attack and stroke.  3. Immunizations/screenings/ancillary studies  Insomnia I asked patient to look into alternative such as trazodone 50mg , ambien 5mg , doxepin 3 or 6mg . trazodone would be my preferred option.   Hyperlipidemia Continue simvastatin 40 mg with LDL at goal of less than 100   Adenocarcinoma of lung Check chest x-ray. Refer to Dr. Lenna Gilford for yearly follow-up and long-term recommendations   Osteoarthritis Refill tramadol for 6 months    6 month follow up given chronic tramadol Orders Placed This Encounter  Procedures  . DG Chest 2 View    Standing Status: Future     Number of Occurrences: 1     Standing Expiration Date: 11/12/2015    Order Specific Question:  Reason for Exam (SYMPTOM  OR DIAGNOSIS REQUIRED)    Answer:  history lung cancer followed by Dr. Lenna Gilford    Order Specific Question:  Preferred imaging location?    Answer:  [facility]  . Ambulatory referral to Pulmonology    Referral Priority:  Routine    Referral Type:  Consultation    Referral Reason:  Specialty Services Required    Referred to Provider:  Noralee Space, MD    Requested Specialty:  Pulmonary Disease    Number of Visits Requested:  1    Meds ordered this encounter  Medications  . conjugated estrogens (PREMARIN) vaginal cream    Sig: Use cream once weekly on Monday night    Dispense:   42.5 g    Refill:  1  . simvastatin (ZOCOR) 40 MG tablet    Sig: Take 1 tablet (40 mg total) by mouth at bedtime.    Dispense:  90 tablet    Refill:  2  . traMADol (ULTRAM) 50 MG tablet    Sig: Take 1 tablet (50 mg total) by mouth 3 (three) times daily as needed. To be called in.    Dispense:  270 tablet    Refill:  5    [employer]VA [employer]  .Marland Kitchen.DAW .Marland KitchenMarland KitchenMarland KitchenDX  LOW BACK PAIN, LUNG CANCER, DJD

## 2014-09-12 NOTE — Patient Instructions (Addendum)
I would encourage you to try water aerobics or walking in water for exercise.   Ask your insurance if they would cover trazodone 50mg , ambien 5mg , doxepin 3 or 6mg   Refilled simvastatin, send in tramadol. Shop for premarin.   Get your chest x-ray then follow up with Dr. Melina Copa will call you

## 2014-09-12 NOTE — Assessment & Plan Note (Signed)
Continue simvastatin 40 mg with LDL at goal of less than 100

## 2014-09-13 ENCOUNTER — Ambulatory Visit: Payer: Medicare Other | Admitting: Pulmonary Disease

## 2014-09-15 ENCOUNTER — Other Ambulatory Visit (HOSPITAL_COMMUNITY)
Admission: RE | Admit: 2014-09-15 | Discharge: 2014-09-15 | Disposition: A | Discharge status: Home / Self Care | Payer: Medicare Other | Source: Ambulatory Visit | Attending: Gynecology | Admitting: Gynecology

## 2014-09-15 ENCOUNTER — Ambulatory Visit (INDEPENDENT_AMBULATORY_CARE_PROVIDER_SITE_OTHER): Payer: Medicare Other | Admitting: Gynecology

## 2014-09-15 ENCOUNTER — Encounter: Payer: Self-pay | Admitting: Gynecology

## 2014-09-15 VITALS — BP 122/76 | Ht 63.0 in | Wt 145.0 lb

## 2014-09-15 DIAGNOSIS — N390 Urinary tract infection, site not specified: Secondary | ICD-10-CM

## 2014-09-15 DIAGNOSIS — M8580 Other specified disorders of bone density and structure, unspecified site: Secondary | ICD-10-CM

## 2014-09-15 DIAGNOSIS — Z124 Encounter for screening for malignant neoplasm of cervix: Secondary | ICD-10-CM | POA: Diagnosis present

## 2014-09-15 DIAGNOSIS — N952 Postmenopausal atrophic vaginitis: Secondary | ICD-10-CM

## 2014-09-15 DIAGNOSIS — N951 Menopausal and female climacteric states: Secondary | ICD-10-CM

## 2014-09-15 DIAGNOSIS — Z1272 Encounter for screening for malignant neoplasm of vagina: Secondary | ICD-10-CM

## 2014-09-15 DIAGNOSIS — Z01419 Encounter for gynecological examination (general) (routine) without abnormal findings: Secondary | ICD-10-CM

## 2014-09-15 MED ORDER — SULFAMETHOXAZOLE-TRIMETHOPRIM 800-160 MG PO TABS: 1.0000 | ORAL_TABLET | Freq: Two times a day (BID) | ORAL | Status: DC

## 2014-09-15 MED ORDER — ESTROGENS, CONJUGATED 0.625 MG/GM VA CREA: TOPICAL_CREAM | VAGINAL | Status: DC

## 2014-09-15 NOTE — Addendum Note (Signed)
Addended by: Joaquin Music on: 09/15/2014 03:24 PM   Modules accepted: Orders

## 2014-09-15 NOTE — Patient Instructions (Signed)
Follow up for bone density as scheduled.  Try over-the-counter soy-based products for the hot flashes.  You may obtain a copy of any labs that were done today by logging onto MyChart as outlined in the instructions provided with your AVS (after visit summary). The office will not call with normal lab results but certainly if there are any significant abnormalities then we will contact you.   Health Maintenance, Female A healthy lifestyle and preventative care can promote health and wellness.  Maintain regular health, dental, and eye exams.  Eat a healthy diet. Foods like vegetables, fruits, whole grains, low-fat dairy products, and lean protein foods contain the nutrients you need without too many calories. Decrease your intake of foods high in solid fats, added sugars, and salt. Get information about a proper diet from your caregiver, if necessary.  Regular physical exercise is one of the most important things you can do for your health. Most adults should get at least 150 minutes of moderate-intensity exercise (any activity that increases your heart rate and causes you to sweat) each week. In addition, most adults need muscle-strengthening exercises on 2 or more days a week.   Maintain a healthy weight. The body mass index (BMI) is a screening tool to identify possible weight problems. It provides an estimate of body fat based on height and weight. Your caregiver can help determine your BMI, and can help you achieve or maintain a healthy weight. For adults 20 years and older:  A BMI below 18.5 is considered underweight.  A BMI of 18.5 to 24.9 is normal.  A BMI of 25 to 29.9 is considered overweight.  A BMI of 30 and above is considered obese.  Maintain normal blood lipids and cholesterol by exercising and minimizing your intake of saturated fat. Eat a balanced diet with plenty of fruits and vegetables. Blood tests for lipids and cholesterol should begin at age 70 and be repeated every 5  years. If your lipid or cholesterol levels are high, you are over 50, or you are a high risk for heart disease, you may need your cholesterol levels checked more frequently.Ongoing high lipid and cholesterol levels should be treated with medicines if diet and exercise are not effective.  If you smoke, find out from your caregiver how to quit. If you do not use tobacco, do not start.  Lung cancer screening is recommended for adults aged 56 80 years who are at high risk for developing lung cancer because of a history of smoking. Yearly low-dose computed tomography (CT) is recommended for people who have at least a 30-pack-year history of smoking and are a current smoker or have quit within the past 15 years. A pack year of smoking is smoking an average of 1 pack of cigarettes a day for 1 year (for example: 1 pack a day for 30 years or 2 packs a day for 15 years). Yearly screening should continue until the smoker has stopped smoking for at least 15 years. Yearly screening should also be stopped for people who develop a health problem that would prevent them from having lung cancer treatment.  If you are pregnant, do not drink alcohol. If you are breastfeeding, be very cautious about drinking alcohol. If you are not pregnant and choose to drink alcohol, do not exceed 1 drink per day. One drink is considered to be 12 ounces (355 mL) of beer, 5 ounces (148 mL) of wine, or 1.5 ounces (44 mL) of liquor.  Avoid use of street drugs.  Do not share needles with anyone. Ask for help if you need support or instructions about stopping the use of drugs.  High blood pressure causes heart disease and increases the risk of stroke. Blood pressure should be checked at least every 1 to 2 years. Ongoing high blood pressure should be treated with medicines, if weight loss and exercise are not effective.  If you are 90 to 79 years old, ask your caregiver if you should take aspirin to prevent strokes.  Diabetes screening  involves taking a blood sample to check your fasting blood sugar level. This should be done once every 3 years, after age 20, if you are within normal weight and without risk factors for diabetes. Testing should be considered at a younger age or be carried out more frequently if you are overweight and have at least 1 risk factor for diabetes.  Breast cancer screening is essential preventative care for women. You should practice "breast self-awareness." This means understanding the normal appearance and feel of your breasts and may include breast self-examination. Any changes detected, no matter how small, should be reported to a caregiver. Women in their 59s and 30s should have a clinical breast exam (CBE) by a caregiver as part of a regular health exam every 1 to 3 years. After age 77, women should have a CBE every year. Starting at age 66, women should consider having a mammogram (breast X-ray) every year. Women who have a family history of breast cancer should talk to their caregiver about genetic screening. Women at a high risk of breast cancer should talk to their caregiver about having an MRI and a mammogram every year.  Breast cancer gene (BRCA)-related cancer risk assessment is recommended for women who have family members with BRCA-related cancers. BRCA-related cancers include breast, ovarian, tubal, and peritoneal cancers. Having family members with these cancers may be associated with an increased risk for harmful changes (mutations) in the breast cancer genes BRCA1 and BRCA2. Results of the assessment will determine the need for genetic counseling and BRCA1 and BRCA2 testing.  The Pap test is a screening test for cervical cancer. Women should have a Pap test starting at age 23. Between ages 34 and 55, Pap tests should be repeated every 2 years. Beginning at age 50, you should have a Pap test every 3 years as long as the past 3 Pap tests have been normal. If you had a hysterectomy for a problem that  was not cancer or a condition that could lead to cancer, then you no longer need Pap tests. If you are between ages 77 and 70, and you have had normal Pap tests going back 10 years, you no longer need Pap tests. If you have had past treatment for cervical cancer or a condition that could lead to cancer, you need Pap tests and screening for cancer for at least 20 years after your treatment. If Pap tests have been discontinued, risk factors (such as a new sexual partner) need to be reassessed to determine if screening should be resumed. Some women have medical problems that increase the chance of getting cervical cancer. In these cases, your caregiver may recommend more frequent screening and Pap tests.  The human papillomavirus (HPV) test is an additional test that may be used for cervical cancer screening. The HPV test looks for the virus that can cause the cell changes on the cervix. The cells collected during the Pap test can be tested for HPV. The HPV test could be used  to screen women aged 70 years and older, and should be used in women of any age who have unclear Pap test results. After the age of 7, women should have HPV testing at the same frequency as a Pap test.  Colorectal cancer can be detected and often prevented. Most routine colorectal cancer screening begins at the age of 66 and continues through age 58. However, your caregiver may recommend screening at an earlier age if you have risk factors for colon cancer. On a yearly basis, your caregiver may provide home test kits to check for hidden blood in the stool. Use of a small camera at the end of a tube, to directly examine the colon (sigmoidoscopy or colonoscopy), can detect the earliest forms of colorectal cancer. Talk to your caregiver about this at age 41, when routine screening begins. Direct examination of the colon should be repeated every 5 to 10 years through age 64, unless early forms of pre-cancerous polyps or small growths are  found.  Hepatitis C blood testing is recommended for all people born from [birth year] through 1963 and any individual with known risks for hepatitis C.  Practice safe sex. Use condoms and avoid high-risk sexual practices to reduce the spread of sexually transmitted infections (STIs). Sexually active women aged 48 and younger should be checked for Chlamydia, which is a common sexually transmitted infection. Older women with new or multiple partners should also be tested for Chlamydia. Testing for other STIs is recommended if you are sexually active and at increased risk.  Osteoporosis is a disease in which the bones lose minerals and strength with aging. This can result in serious bone fractures. The risk of osteoporosis can be identified using a bone density scan. Women ages 38 and over and women at risk for fractures or osteoporosis should discuss screening with their caregivers. Ask your caregiver whether you should be taking a calcium supplement or vitamin D to reduce the rate of osteoporosis.  Menopause can be associated with physical symptoms and risks. Hormone replacement therapy is available to decrease symptoms and risks. You should talk to your caregiver about whether hormone replacement therapy is right for you.  Use sunscreen. Apply sunscreen liberally and repeatedly throughout the day. You should seek shade when your shadow is shorter than you. Protect yourself by wearing long sleeves, pants, a wide-brimmed hat, and sunglasses year round, whenever you are outdoors.  Notify your caregiver of new moles or changes in moles, especially if there is a change in shape or color. Also notify your caregiver if a mole is larger than the size of a pencil eraser.  Stay current with your immunizations. Document Released: 12/17/2010 Document Revised: 09/28/2012 Document Reviewed: 12/17/2010 [facility] Patient Information 2014 [place].

## 2014-09-15 NOTE — Progress Notes (Signed)
Krystal Craig October 13, 1932 [id number]        81 y.o.  G2P2 for breast and pelvic exam. Has not been in the office since 2010. Several issues noted below.  Past medical history,surgical history, problem list, medications, allergies, family history and social history were all reviewed and documented as reviewed in the EPIC chart.  ROS:  Performed with pertinent positives and negatives included in the history, assessment and plan.   Additional significant findings :  none   Exam: Kim Counsellor Vitals:   09/15/14 1140  BP: 122/76  Height: 5\' 3"  (1.6 m)  Weight: 145 lb (65.772 kg)   General appearance:  Normal affect, orientation and appearance. Skin: Grossly normal HEENT: Without gross lesions.  No cervical or supraclavicular adenopathy. Thyroid normal.  Lungs:  Clear without wheezing, rales or rhonchi Cardiac: RR, without RMG Abdominal:  Soft, nontender, without masses, guarding, rebound, organomegaly or hernia Breasts:  Examined lying and sitting without masses, retractions, discharge or axillary adenopathy. Pelvic:  Ext/BUS/vagina with generalized atrophic changes. Pap smear of cuff done.  Adnexa  Without masses or tenderness    Anus and perineum  Normal   Rectovaginal  Normal sphincter tone without palpated masses or tenderness.    Assessment/Plan:  81 y.o. G2P2 female for breast and pelvic exam.   1. Postmenopausal/atrophic genital changes/menopausal symptoms.  History of TAH 1969 for endometriosis/pelvic pain and hypermenorrhea per Dr. Valeta Harms note. Still having hot flashes on and off. Uses Premarin cream intermittently for vaginal dryness. Usually once a week or every other week. Has been obtaining the prescription from Dr. Lenna Gilford. I reviewed the issues of vaginal absorption and systemic levels and risks to include stroke heart attack DVT. Possible breast cancer and her history of lung cancer and issues of estrogen use were reviewed. Patient feels comfortable using it  intermittently and I refilled her 1 year. Options for her hot flushes reviewed. Do not feel systemic estrogen indicated with risk of thrombosis/stroke at 81. Will try OTC soy based products to see if this doesn't help. Alternatives such as prescription nonhormonal like Effexor also reviewed the patient not interested. 2. History of sling/cystocele rectocele repair. Vaginal cuff well supported. Without evidence of rectocele cystocele. 3. History of recurrent UTIs. Has 3-4 per year. Reports being evaluated previously by urology with negative findings. Is treated intermittently for this. I gave her a prescription for Septra DS 1 by mouth twice a day 3 days with 2 refills to have available for symptoms. Check urinalysis today. 4. Osteopenia. DEXA 2010 T score -1.9. Had been on Actonel from 2008 through 2010. She subsequently discontinued. Said that it caused mouth discomfort with use. Recheck baseline DEXA now and patient agrees to schedule.  Increased calcium vitamin D. 5. Mammography 10/2013. Continue with annual mammography. SBE monthly reviewed. 6. Pap smear 2010. Pap smear of vaginal cuff done today. No history of significant abnormal Pap smears. Options to stop screening as she is status post hysterectomy and over the age of 64 reviewed per current screening guidelines. Will readdress on annual basis. 7. Colonoscopy 2015. Was told she did not need to repeat this again based on her age. 8. Health maintenance. No routine blood work done as this is done at her primary physician's office. Follow up 1 year, sooner as needed.     Anastasio Auerbach MD, 12:21 PM 09/15/2014

## 2014-09-15 NOTE — Addendum Note (Signed)
Addended by: Nelva Nay on: 09/15/2014 12:43 PM   Modules accepted: Orders

## 2014-09-16 ENCOUNTER — Ambulatory Visit (INDEPENDENT_AMBULATORY_CARE_PROVIDER_SITE_OTHER)
Admission: RE | Admit: 2014-09-16 | Discharge: 2014-09-16 | Disposition: A | Discharge status: Home / Self Care | Payer: Medicare Other | Source: Ambulatory Visit | Attending: Family Medicine | Admitting: Family Medicine

## 2014-09-16 DIAGNOSIS — Z85118 Personal history of other malignant neoplasm of bronchus and lung: Secondary | ICD-10-CM | POA: Diagnosis not present

## 2014-09-19 LAB — CYTOLOGY - PAP

## 2014-09-20 ENCOUNTER — Encounter: Payer: Self-pay | Admitting: Pulmonary Disease

## 2014-09-20 ENCOUNTER — Ambulatory Visit (INDEPENDENT_AMBULATORY_CARE_PROVIDER_SITE_OTHER): Payer: Medicare Other | Admitting: Pulmonary Disease

## 2014-09-20 ENCOUNTER — Telehealth: Payer: Self-pay | Admitting: Pulmonary Disease

## 2014-09-20 VITALS — BP 142/72 | HR 98 | Temp 99.0°F | Ht 63.0 in | Wt 146.2 lb

## 2014-09-20 DIAGNOSIS — R0600 Dyspnea, unspecified: Secondary | ICD-10-CM | POA: Diagnosis not present

## 2014-09-20 DIAGNOSIS — C3492 Malignant neoplasm of unspecified part of left bronchus or lung: Secondary | ICD-10-CM

## 2014-09-20 MED ORDER — ZOLPIDEM TARTRATE 10 MG PO TABS: 10.0000 mg | ORAL_TABLET | Freq: Every evening | ORAL | Status: DC | PRN

## 2014-09-20 NOTE — Patient Instructions (Signed)
Today we updated your med list in our EPIC system...    Continue your current medications the same...  We reviewed your recent CXR & it remains clear...  Call for any questions...  Let's plan a follow up visit in 81yr, sooner if needed for breathing problems...  Call [facility] at [phone] to see if they can help w/ med costs.Marland KitchenMarland Kitchen

## 2014-09-20 NOTE — Progress Notes (Signed)
Subjective:     Patient ID: Krystal Craig, female   DOB: September 01, 1932, 81 y.o.   MRN: [id number]  HPI 81 y/o WF here for a follow up visit... she has multiple medical problems as noted below...   ~  July 31, 2010:  Yearly ROV & still notes the burning tongueprob even w/o the Bisphos rx, pt notes that Prilosec makes it worse;  also c/o recent URI- allergy symptoms sneezing, sinus HA, etc> we discussed Rx w/ Antihist in AM, Saline nasal mist Q1-2H, Flonase Qhs; she recently had bilart cat surg.     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    She remains stable on Simva40 for chol;  GI is stable & she had her last colon 3/05 (tortuous, divertics, no polyps);  DJD & LBP w/ chiropractor rx;  as noted she is off Bisphos meds w/ osteopenia on BMD followed by [place]...  ~  August 01, 2011:  Yearly ROV & review> Krystal Craig states that she is doing well, hjad a good yr, no new complaints or concerns...     AR, Hx bronchitis> she uses OTC antihist etc; denies recurrent bronchitic infections w/o cough, sput, hemop, SOB, etc...    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    Hx Palpit> On ASA81; she notes rare palpit, self limited- no CP, dizziness, syncope,etc...    Hyperlipid> On Simva40;  FLP showed TChol 184, TG 212, HDL 56, LDL 100; advised better low fat diet...    HH, GERD> On Prilosec OTC as needed & states she hasn't needed it; denies heartburn, reflux, dysphagia, abd pain, n/v, etc...    Divertics, IBS> she denies abd discomfort, d/c/ blood seen, etc; she will be due for 72yr f/u colon 3/15    Hx recurrent UTIs> none since  DrPeterson did her cystocele, rectocele, & sling surg 10/08; followed by Gwendolyn Grant now...    DJD, LBP> On Tramadol prn; she manages well, get plenty of exercise, still sees Chiropractor Qmo...    Osteopenia> On Calcium, MVI, VitD; BMDs per gyn- "DrMody said to just watch it"...  CXR 2/13 showed heart size at upper limit, calcif tort Ao, s/p surg on left w/ hyperinflation, scarring left base, NAD...  LABS 2/13:  FLP- at goals on Simva40 x TG212;  Chems- wnl;  CBC- wnl;  TSH=2.06;  VitD=54;  UA- clear  ~  November 18, 2011:  15mo ROV & post hospital check> Krystal Craig was [facility] 4/9 - 09/25/11 by [facility] w/ CP> she had a hx that sounded like angina; CTAngio showing neg for PE, bibasilar atx, one prom periaortic node, divertics in colon; CXR showed normal heart size & essentially clear lungs w/ surg clips in left hilum;  Neg cardiac enzymes, and normal baseline EKG; LABS were essentially neg as well... She saw DrCooper in office 4/13 & he did Myoview> neg- no ischemia, no infarct, normal wall motion & EF=67%... He told Krystal Craig that it was prob spasm & she has NTG for prn use if needed... She was disch on Protonix but she says it made her nauseated & she  stopped it, now feels better x for gas & we discussed Simethacone- Mylicon, GasX, etc; also notes some constipation & we reviewed Miralax, Senakot-S... We reviewed prob list, meds, xrays and labs> see below>>  ~  September 07, 2012:  2mo ROV & Krystal Craig reports that Krystal Craig has diagnosed her w/ Psoriasis on her legs and started MTX rx...    AR, Hx bronchitis> she uses OTC antihist etc; denies recurrent bronchitic infections w/o cough, sput, hemop, SOB, etc...    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    Hx Palpit> On ASA81; she notes rare palpit, self limited- she knows to avoid caffeine etc; no CP, dizziness, syncope,etc...    Hyperlipid> On Simva40;  FLP 2/14  showed TChol 172, TG 213, HDL 44, LDL 92; advised better low fat diet...    HH, GERD> On Prilosec OTC as needed & states she hasn't needed it; denies heartburn, reflux, dysphagia, abd pain, n/v, etc...    Divertics, IBS> on Miralax; she denies abd discomfort, d/c/ blood seen, etc; she will be due for 25yr f/u colon 3/15    Hx recurrent UTIs> none since DrPeterson did her cystocele, rectocele, & sling surg 10/08; followed by Gwendolyn Grant now on Premarin0.625...    DJD, LBP> On Tramadol prn; she manages well, get plenty of exercise, still sees Chiropractor Qmo...    Osteopenia> On Calcium, MVI, VitD; BMDs per gyn- "DrMody said to just watch it"...    Psoriasis> on MTX2.5-3tabs, 3xper wk;  We reviewed prob list, meds, xrays and labs> see below for updates >> she had the 2013 Flu vaccine 11/13...  LABS 2/14:  FLP- ok x TG=213 on Simva40;  Chems- wnl;  CBC- wnl;  TSH=1.63;  VitD=55...  CXR 3/14 showed normal heart size, some scarring at left base chronic, postop changes in left hilum, NAD...  ~  September 08, 2013:  Yearly [place] notes some right ear pressure, discomfort, and wonders about an infection; Exam showed a soft tissue mass occluding the EAC & we will refer to ENT for further eval & Rx... She also reports a "burning tongue" & went to 5 doctors, sent to [facility], no real answer but she chews gum & this helps... We reviewed the following medical problems during today's office visit >>     AR, Hx bronchitis> she uses OTC antihist etc; denies recurrent bronchitic infections w/o cough, sput, hemop, SOB, etc...    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    Hx Palpit> On ASA81; she notes rare palpit, self limited- she knows to avoid caffeine etc; no CP, dizziness, syncope,etc...    Hyperlipid> On Simva40;  FLP 3/15 showed TChol 150, TG 234, HDL 47, LDL 56; advised better low fat diet...     HH, GERD> On Prilosec OTC as needed; denies heartburn, reflux, dysphagia, abd pain, n/v, etc...    Divertics, IBS> on Miralax; she denies abd discomfort, d/c/ blood seen, etc; she will be due for 68yr f/u colon 3/15    Hx recurrent UTIs> none since DrPeterson did her cystocele, rectocele, & sling surg 10/08; followed by Gwendolyn Grant now on Premarin0.625...    DJD, LBP> On Tramadol prn; she manages well, get plenty of exercise, still sees Chiropractor Qmo...    Osteopenia> On Calcium, MVI,  VitD; BMDs per gyn- "DrMody said to just watch it"...    Psoriasis> off  MTX & treated w/ creams by Verlan Friends... We reviewed prob list, meds, xrays and labs> see below for updates >> she had the 2014 flu vaccine 10/14...   LABS 3/15:  FLP- ok on Simva40 x TG=234;  Chems- wnl;  CBC- wnl;  TSH=1.93;  VitD=74...   CXR 3/15 showed norm heart size, post op changes on left, clear lungs, NAD.Marland Kitchen.   ~  September 20, 2014:  89yr [place] reports a good yr- notes breathing is good & denies cough, sput, hemoptysis, SOB, CP; she remains active & is most limited by back discomfort;  She had Ear surg by [facility] to relieve EAC blockage from a boney growth- fully recovered & improved;  She has been followed for primary care by DrSHunter at [place] 3/16 & his note is reviewed;  She also sees DrFontaine for GYN...    AR, Hx bronchitis> she uses OTC antihist etc; denies recurrent bronchitic infections w/o cough, sput, hemop, SOB, etc... Not requiring breathing meds.    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 We reviewed prob list, meds, xrays and labs> see below for updates >>   CXR 08/2014 showed norm heart size, post op changes on left w/ scarring, otherw clear/ NAD...   LABS 3/16 per DrHunter reviewed> OK x elev TG=237...           PROBLEM LIST:    ALLERGIC RHINITIS (ICD-477.9) - she uses OTC Zyrtek  Prn> advised sinus regimen w/ Antihist Qam, Nasal saline mist every 1-2H during the day, Mucinex 1-2 Bid w/ fluids, & Flonase Qhs...  Hx of BRONCHITIS (ICD-490) - no recent URI's or bronchitic infections... she denies cough, sputum, hemoptysis, worsening dyspnea,  wheezing, chest pains, snoring, daytime hypersomnolence, etc...   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 underwent LLLobectomy via VATS & minithoracotomy by DrBurney 6/06 w/ 1.5cm BRONCHOALVEOLAR CELL CANCER & neg nodes... she saw DrMohammed post-op, no adjuvant therapy recommended... she's been followed regularly by DrBurney since surg Q51mo w/ CXR, alt w/ CT scans... ~  CXR 3/09 by DrBurney- no evid of recurrence, left pleural thickening, otherw neg... ~  CT Chest 9/09 showed post-op changes, precarinal LN w/o change, 72mm nodule ant left lung w/o change, scar RML w/o change, thoracic spondy- NAD.Marland Kitchen. ~  CXR 3/10 showed vol loss on left w/ blunt left angle, NAD. ~  CT Chest 10/10 showed clear lungs w/o nodules or infiltrates, stable precarinal LN. ~  CXR 4/11 revealed post op changes, NAD.Marland KitchenMarland Kitchen ~  CT Chest 11/11 showed postop changes, NAD.Marland Kitchen. ~  CXR 2/13 showed heart size at upper limit, calcif tort Ao, s/p surg on left w/ hyperinflation, scarring left base, NAD.Marland KitchenMarland Kitchen  ~  CT Angio Chest 4/13 showed neg for PE, bibasilar atx, one prom periaortic node, divertics in colon... ~  CXR 3/14 showed normal heart size, some scarring at left base chronic, postop changes in left hilum, NAD. ~  CXR 3/15 showed norm heart size, post op changes on left, clear lungs, NAD.Marland Kitchen. ~  CXR 08/2014 showed norm heart size, post op changes on left w/ scarring, otherw clear/ NAD...  Hx Chest Pain >> see 4/13 Hosp eval... PALPITATIONS, HX OF (ICD-V12.50) - she takes ASA 81mg /d... notes rare palpit, no CP/ syncope/ dyspnea, edema,  etc... ~  NuclearStressTest 12/05 was neg- no scar or ischemia, EF= 70%... ~  EKG 4/13 showed NSR,  rate71, wnl, NAD...  HYPERCHOLESTEROLEMIA (ICD-272.0) - on SIMVASTATIN 40mg /d + low fat diet... ~  [place] 11/07 showed TChol 181, TG -, HDL 48, LDL 95 ~  FLP 1/09 showed TChol 162, TG 198, HDL 43, LDL 80 ~  FLP 2/10 showed TChol 132, TG 144, HDL 41, LDL 62 ~  FLP 2/11 showed TChol 167, TG 271, HDL 57, LDL 90... rec> better low fat diet. ~  FLP 2/12 showed TChol 173, TG 165, HDL 56, LDL 84 ~  FLP 2/13 on Simva40 showed TChol 184, TG 212, HDL 56, LDL 100  ~  FLP 2/14 on Simva40 showed TChol 172, TG 213, HDL 44, LDL 92  ~  FLP 3/15 on Simva40 showed TChol 150, TG 234, HDL 47, LDL 56   HIATAL HERNIA (ICD-553.3), & GERD (ICD-530.81) - she states she is doing fine and does not require PPI therapy... 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 ~  2/11: she notes using occas Mylanta & advised to use Prilosec Prn.  DIVERTICULOSIS OF COLON (ICD-562.10), & IRRITABLE BOWEL SYNDROME (ICD-564.1) - last colonoscopy 3/05 by DrSamLeB showed tortuous colon, divertics, otherw neg... ~  3/15: she is due for 10 yr f/u colonoscopy &we will refer to GI...   Hx of UTI'S, RECURRENT (ICD-599.0) - she had recurrent UTI's leading DrGottsegen to refer her to [place] for surg w/ cystocele/ rectocele repairs & sling 10/08... ~  She denies recurrent UTIs & is now followed by Gwendolyn Grant...  OSTEOARTHRITIS (ICD-715.90) - she has had trigger fingers and several injections by DrSypher... ~  s/p bilat shoulder operations for impingement syndromes in 2001 by DrPaul... ~  She takes TRAMADOL 50mg  prn pain...  BACK PAIN, LUMBAR (ICD-724.2) - she still sees her chiropractor every month for LBP adjustments...  OSTEOPENIA (ICD-733.90) - she states that BMD's are done by DrGottsegen et al and she was prev on Boniva but stopped this in 2010 due to "burning tongue" & ultimately has dental consult at [facility] w/ rec to stop the Bisphos Rx (symptoms did abate somewhat)...  she takes Calcium, MVI, VitD, & DrMody has her on Premarin Vag cream weekly... ~  BMD at [facility] 5/08 showed TScores +0.1 in Spine, and -2.0 in left FemNeck (sl worse than 2006) ~  BMD in 8/10 at [facility] showed [name] ?Spine, and -1.9 in FemNeck (sl improved from 2008) ~  Osteopenia followed by GYN & they do her BMDs; she tells me that DrMody said to "just watch it" since she is INTOL to Bisphos rx.  INSOMNIA (ICD-780.52) - prev on Ambien Prn but she indicates that she rests well now w/o need for meds...  Health Maintenance: ~  GYN:  she has a new GYN= DrMody on Premarin cream weekly...   Past Surgical History  Procedure Laterality Date  . Appendectomy  1969  . Abdominal hysterectomy  1969  . Cholecystectomy  1976  . Bilateral shouler operations for impingement syndromes  2001    Dr. Eddie Dibbles  . Left lower lobectomy  11/2004    via VATS and mini-thoractomy for 1.5cm adenoca of lung by Dr. Arlyce Dice  . Urologic sugery with cystocele/rectocele repairs and sling  03/2007    Dr. Terance Hart  . Bilateral cataract surgery  2012    Dr. Talbert Forest  . [place] ear Right 09/2013  . Appendectomy  Outpatient Encounter Prescriptions as of 09/20/2014  Medication Sig  . acetaminophen (TYLENOL) 500 MG tablet Take 500 mg by mouth every 6 (six) hours as needed.  Marland Kitchen aspirin 81 MG tablet Take 81 mg by mouth daily.  . Calcium Carbonate-Vitamin D (CALCIUM-D) 600-400 MG-UNIT TABS Take 1 tablet by mouth 2 (two) times daily.  Marland Kitchen conjugated estrogens (PREMARIN) vaginal cream Use cream once weekly as needed  . Multiple Vitamins-Minerals (CENTRUM SILVER PO) Take 1 tablet by mouth daily.  . polyethylene glycol (MIRALAX / GLYCOLAX) packet Take 17 g by mouth daily.  . simvastatin (ZOCOR) 40 MG tablet Take 1 tablet (40 mg total) by mouth at bedtime.  . traMADol (ULTRAM) 50 MG tablet Take 1 tablet (50 mg total) by mouth 3 (three) times daily as needed. To be called in.  . sulfamethoxazole-trimethoprim (SEPTRA DS) 800-160 MG per tablet  Take 1 tablet by mouth 2 (two) times daily. For 3 days (Patient not taking: Reported on 09/20/2014)  . zolpidem (AMBIEN) 10 MG tablet Take 1 tablet (10 mg total) by mouth at bedtime as needed for sleep.    Allergies  Allergen Reactions  . Codeine     Chest pain/tightness    Current Medications, Allergies, Past Medical History, Past Surgical History, Family History, and Social History were reviewed in [employer] record.     Review of Systems         See HPI - all other systems neg except as noted...  The patient denies anorexia, fever, weight loss, weight gain, vision loss, decreased hearing, hoarseness, chest pain, syncope, dyspnea on exertion, peripheral edema, prolonged cough, headaches, hemoptysis, abdominal pain, melena, hematochezia, severe indigestion/heartburn, hematuria, incontinence, muscle weakness, suspicious skin lesions, transient blindness, difficulty walking, depression, unusual weight change, abnormal bleeding, enlarged lymph nodes, and angioedema.     Objective:   Physical Exam     WD, WN, 81 y/o WF in NAD... GENERAL:  Alert & oriented; pleasant & cooperative. HEENT:  [place]/AT, EOM-wnl, PERRLA, EACs-clear, TMs-wnl, NOSE-clear, THROAT-clear & wnl. NECK:  Supple w/ fairROM; no JVD; normal carotid impulses w/o bruits; no thyromegaly or nodules palpated; no lymphadenopathy. CHEST:  Clear to P & A; without wheezes/ rales/ or rhonchi; scar from prev surg on left. HEART:  Regular Rhythm; without murmurs/ rubs/ or gallops heard... ABDOMEN:  Soft & nontender; normal bowel sounds; no organomegaly or masses detected. EXT: without deformities, mild arthritic changes; no varicose veins/ venous insuffic/ or edema. NEURO:  CN's intact; motor testing normal; sensory testing normal; gait normal & balance OK. DERM:  No lesions noted; no rash etc...  RADIOLOGY DATA:  Reviewed in the EPIC EMR & discussed w/ the patient...    LABORATORY DATA:  Reviewed in the EPIC  EMR & discussed w/ the patient...   Assessment:      AR/ Bronchitis>  She uses OTC antihist etc as needed; she denies intercurrent bronchitic infections, etc...  Hx Lung Cancer>  Hx as above; routine CXR is neg- no evid for recurrent dis...   Medical problems managed by DrHunter>>  Palpit & Hx CP>  She notes intermittent self limited palpit & knows to avoid caffeine, pseudophed, etc; see 4/13 Hosp for CP & neg eval Drcooper... Hyperlipid>  Chol numbers ok on the Simva40; needs better low fat diet for the TGs... GI> HH, GERD, Divertics, IBS>  Hx PUD surg yrs ago & she takes an occas Prilosec prn; denies IBS symptoms & says BMs are normal; colon 3/15 w/ divertics and hem... Hx  recurrent UTIs in the past; no prob since sling surg 2008... DJD, LBP>  Doing satis on Tramadol & Chiropractor adjustments Qmo she says... Osteopenia>  Followed by Gwendolyn Grant...     Plan:     Patient's Medications  New Prescriptions   No medications on file  Previous Medications   ACETAMINOPHEN (TYLENOL) 500 MG TABLET    Take 500 mg by mouth every 6 (six) hours as needed.   ASPIRIN 81 MG TABLET    Take 81 mg by mouth daily.   CALCIUM CARBONATE-VITAMIN D (CALCIUM-D) 600-400 MG-UNIT TABS    Take 1 tablet by mouth 2 (two) times daily.   CONJUGATED ESTROGENS (PREMARIN) VAGINAL CREAM    Use cream once weekly as needed   MULTIPLE VITAMINS-MINERALS (CENTRUM SILVER PO)    Take 1 tablet by mouth daily.   POLYETHYLENE GLYCOL (MIRALAX / GLYCOLAX) PACKET    Take 17 g by mouth daily.   SIMVASTATIN (ZOCOR) 40 MG TABLET    Take 1 tablet (40 mg total) by mouth at bedtime.   SULFAMETHOXAZOLE-TRIMETHOPRIM (SEPTRA DS) 800-160 MG PER TABLET    Take 1 tablet by mouth 2 (two) times daily. For 3 days   TRAMADOL (ULTRAM) 50 MG TABLET    Take 1 tablet (50 mg total) by mouth 3 (three) times daily as needed. To be called in.   ZOLPIDEM (AMBIEN) 10 MG TABLET    Take 1 tablet (10 mg total) by mouth at bedtime as needed for sleep.   Modified Medications   No medications on file  Discontinued Medications   No medications on file

## 2014-09-20 NOTE — Telephone Encounter (Signed)
Spoke with pt, states she is changing to [facility] for her Lorrin Mais, is wanting to change her ambien rx to a 90 day supply on this medication.  This was discussed in her ov with SN today, see ov note.  rx called in.  nohting further needed.

## 2014-09-29 ENCOUNTER — Encounter: Payer: Self-pay | Admitting: Obstetrics and Gynecology

## 2014-10-04 ENCOUNTER — Ambulatory Visit (INDEPENDENT_AMBULATORY_CARE_PROVIDER_SITE_OTHER): Payer: Medicare Other

## 2014-10-04 ENCOUNTER — Telehealth: Payer: Self-pay | Admitting: Gynecology

## 2014-10-04 DIAGNOSIS — M8580 Other specified disorders of bone density and structure, unspecified site: Secondary | ICD-10-CM

## 2014-10-04 NOTE — Telephone Encounter (Signed)
Tell patient that both of her hips have shown some loss of calcium with a calculated risk of hip fracture 7-8% and major fracture anywhere at 19%. This warrants a discussion about treatment options. I know she was on Actonel before but there are other choices such as Prolia. Recommend office visit to discuss treatment options.

## 2014-10-05 NOTE — Telephone Encounter (Signed)
Pt informed with the below note. 

## 2014-10-05 NOTE — Telephone Encounter (Signed)
Left message for pt to call.

## 2014-11-09 LAB — HM MAMMOGRAPHY: HM Mammogram: NORMAL

## 2014-11-15 ENCOUNTER — Encounter: Payer: Self-pay | Admitting: Family Medicine

## 2014-11-16 ENCOUNTER — Encounter: Payer: Self-pay | Admitting: Family Medicine

## 2014-11-22 ENCOUNTER — Encounter: Payer: Self-pay | Admitting: Family Medicine

## 2014-11-29 ENCOUNTER — Ambulatory Visit (INDEPENDENT_AMBULATORY_CARE_PROVIDER_SITE_OTHER): Payer: Medicare Other | Admitting: Family Medicine

## 2014-11-29 ENCOUNTER — Encounter: Payer: Self-pay | Admitting: Family Medicine

## 2014-11-29 ENCOUNTER — Telehealth: Payer: Self-pay | Admitting: Family Medicine

## 2014-11-29 VITALS — BP 174/70 | HR 83 | Temp 98.4°F | Wt 147.0 lb

## 2014-11-29 DIAGNOSIS — R1030 Lower abdominal pain, unspecified: Secondary | ICD-10-CM | POA: Diagnosis not present

## 2014-11-29 DIAGNOSIS — R1032 Left lower quadrant pain: Secondary | ICD-10-CM | POA: Insufficient documentation

## 2014-11-29 MED ORDER — HYDROCODONE-ACETAMINOPHEN 5-325 MG PO TABS: 0.5000 | ORAL_TABLET | Freq: Four times a day (QID) | ORAL | Status: DC | PRN

## 2014-11-29 NOTE — Telephone Encounter (Signed)
[facility] Day - Client [facility] Patient Name: Krystal Craig DOB: 01/14/1933 Initial Comment Caller states c/o severe groin pain Nurse Assessment Nurse: Markus Daft, RN, [place] Date/Time (Eastern Time): 11/29/2014 10:23:09 AM Confirm and document reason for call. If symptomatic, describe symptoms. ---Caller states c/o severe left groin pain and spreads to the left sided hip/lower back. She can not stand straight up. Can only walk if she leans forward. Ongoing lower back problems for last several weeks, and then spread to groin about 3-4 wks. ago and then went away. Woke up yesterday, felt fine, then stood up after reading the paper when she started to have the severe pain in the groin and spreading to the back. Chiropractor feels is a muscle issue, seen yesterday. Pain radiates down the left leg at times. She states there is little pain when sitting, but standing up is severe pain. Has the patient traveled out of the country within the last 30 days? ---Not Applicable Does the patient require triage? ---Yes Related visit to physician within the last 2 weeks? ---No Does the PT have any chronic conditions? (i.e. diabetes, asthma, etc.) ---Yes List chronic conditions. ---chronic back pain; arthritis in hands/back - takes Tramadol; high cholesterol Guidelines Guideline Title Affirmed Question Affirmed Notes Hip Pain [1] SEVERE pain (e.g., excruciating, unable to do any normal activities) AND [2] not improved after 2 hours of pain medicine Final Disposition User See Physician within 4 Hours (or PCP triage) Markus Daft, RN, [place]re Poot Comments Having internet/pc issues and advised caller to call the office to set up this appt today. -- Now added notes.  Pt should have called and set appt. Thank you!

## 2014-11-29 NOTE — Assessment & Plan Note (Signed)
Severe and new onset yesterday morning. History osteopenia. Potential fracture though no fall or injury before pain started. Left groin worsened by IR of left hip. Refer to ortho. Norco 5/325 #15 given. Try to get in within 24 hours, today if possible. Patient does have history chronic low back pain/neck pain/wrist pain on tramadol but this is far and away the most pain I have ever seen her in. She nearly fell down and required significant support from me to prevent this when getting off of table. She normally can get on table without assist.

## 2014-11-29 NOTE — Patient Instructions (Signed)
Left groin pain- severe  Get you into ortho within 48 hours hopefully- trying to schedule before you leave  Pain medicine prescribed  Please ask them to send me a copy of your records  If you could update Keba what they said after visit that would be great too

## 2014-11-29 NOTE — Progress Notes (Signed)
Garret Reddish, MD  Subjective:  Krystal Craig is a 81 y.o. year old very pleasant female patient who presents with:  Left groin pain -history of low back pain worse over last few weeks mainly in left low back. 3-4 weeks ago had some pain in left groin. Yesterday morning when she got up had a bad pain from left low back into left groin. Went to chiropractor yesterday and thought muscular from a twisting position she had done before. No x-rays at that time. Pain rated as a severe ache. Is able to walk but is bent over at 45 degrees as only way to make pain tolerable when walking  No history of falls but fell this morning when going to bathroom due to severity of pain.   Has seen [facility] about 15 years ago but no regular ortho follow up- Dr. Eddie Dibbles has since retired.   She has known osteopenia on ca/vitamin D only  ROS- no fever, chills, nausea, vomiting. No fecal or urinary incontinence. Leg weakness due to pain. lef  Past Medical History- osteoarthritis of low back, hands, wrist, neck on tramadol '50mg'$  TID, CKD stage III, history lung cancer with 20% lung resection 2006  Medications- reviewed and updated Current Outpatient Prescriptions  Medication Sig Dispense Refill  . acetaminophen (TYLENOL) 500 MG tablet Take 500 mg by mouth every 6 (six) hours as needed.    Marland Kitchen aspirin 81 MG tablet Take 81 mg by mouth daily.    . Calcium Carbonate-Vitamin D (CALCIUM-D) 600-400 MG-UNIT TABS Take 1 tablet by mouth 2 (two) times daily.    Marland Kitchen conjugated estrogens (PREMARIN) vaginal cream Use cream once weekly as needed 42.5 g 1  . Multiple Vitamins-Minerals (CENTRUM SILVER PO) Take 1 tablet by mouth daily.    . polyethylene glycol (MIRALAX / GLYCOLAX) packet Take 17 g by mouth daily.    . simvastatin (ZOCOR) 40 MG tablet Take 1 tablet (40 mg total) by mouth at bedtime. 90 tablet 2  . traMADol (ULTRAM) 50 MG tablet Take 1 tablet (50 mg total) by mouth 3 (three) times daily as needed. To be  called in. 90 tablet 5  . zolpidem (AMBIEN) 10 MG tablet Take 1 tablet (10 mg total) by mouth at bedtime as needed for sleep. (Patient not taking: Reported on 11/29/2014) 90 tablet 1   No current facility-administered medications for this visit.    Objective: BP 174/70 mmHg  Pulse 83  Temp(Src) 98.4 F (36.9 C)  Wt 147 lb (66.679 kg) Gen: NAD, leaning onto right side and bent forward CV: RRR no murmurs rubs or gallops Lungs: CTAB no crackles, wheeze, rhonchi Abdomen: soft/nontender/nondistended/normal bowel sounds. No rebound or guarding. NO LLQ pain.  No hernia noted.  Ext: no edema Skin: warm, dry  Hip: Normal range of motion bilaterally but internal rotation produces severe pain.  IR: Flexion: 4+/5, Extension: 5/5, Abduction: 5/5, Adduction: 5/5. 5/5 strength below knee 1+ reflex at knee.  Greater trochanter without tenderness to palpation. No tenderness over piriformis and greater trochanter. No SI joint tenderness and normal minimal SI movement.   Assessment/Plan:  Left groin pain Severe and new onset yesterday morning. History osteopenia. Potential fracture though no fall or injury before pain started. Left groin worsened by IR of left hip. Refer to ortho. Norco 5/325 #15 given. Try to get in within 24 hours, today if possible. Patient does have history chronic low back pain/neck pain/wrist pain on tramadol but this is far and away the most pain I  have ever seen her in. She nearly fell down and required significant support from me to prevent this when getting off of table. She normally can get on table without assist.    Orders Placed This Encounter  Procedures  . Ambulatory referral to Orthopedic Surgery    Referral Priority:  Urgent    Referral Type:  Surgical    Referral Reason:  Specialty Services Required    Requested Specialty:  Orthopedic Surgery    Number of Visits Requested:  1    Meds ordered this encounter  Medications  . HYDROcodone-acetaminophen  (NORCO/VICODIN) 5-325 MG per tablet    Sig: Take 0.5-1 tablets by mouth every 6 (six) hours as needed.    Dispense:  15 tablet    Refill:  0

## 2014-11-29 NOTE — Telephone Encounter (Signed)
Pt was seen in office by Dr. Yong Channel 11/29/14 at 1:15pm

## 2014-12-13 ENCOUNTER — Telehealth: Payer: Self-pay | Admitting: Family Medicine

## 2014-12-13 NOTE — Telephone Encounter (Signed)
Pt went to orthopedic today and they want to do back surgery.   Pt is bringing over clearance letter.  Pt would like to know if she needs an appt for clearance? I can make when she brings letter is soo. However. Pt had cpe 09/12/14. pls advise

## 2014-12-13 NOTE — Telephone Encounter (Signed)
See below

## 2014-12-13 NOTE — Telephone Encounter (Signed)
Bevelyn Ngo bring this directly to me please. Or request another copy if we need to.

## 2014-12-13 NOTE — Telephone Encounter (Signed)
Pt dropped off form and wants you to know  she is in as much pain as she was in the other days.  So wants to get going on this.

## 2014-12-14 ENCOUNTER — Telehealth: Payer: Self-pay | Admitting: Family Medicine

## 2014-12-14 NOTE — Telephone Encounter (Signed)
Patient would like for you to call her. Thank you

## 2014-12-14 NOTE — Telephone Encounter (Signed)
Can complete 4 mets (stairs and incline) without difficulty, chest pain, or shortness of breath. Lipids controlled. BP only high due to pain. Does have CKD III.   Medically maximized for surgery though. Form sent

## 2014-12-14 NOTE — Telephone Encounter (Signed)
Returned pt call and informed pt that we will call her once form has been filled out and ready for pick up.

## 2015-01-03 ENCOUNTER — Encounter (HOSPITAL_COMMUNITY): Payer: Self-pay

## 2015-01-03 ENCOUNTER — Encounter (HOSPITAL_COMMUNITY)
Admission: RE | Admit: 2015-01-03 | Discharge: 2015-01-03 | Disposition: A | Discharge status: Home / Self Care | Payer: Medicare Other | Source: Ambulatory Visit | Attending: Orthopedic Surgery | Admitting: Orthopedic Surgery

## 2015-01-03 LAB — BASIC METABOLIC PANEL
ANION GAP: 10 (ref 5–15)
BUN: 14 mg/dL (ref 6–20)
CO2: 27 mmol/L (ref 22–32)
CREATININE: 1.03 mg/dL — AB (ref 0.44–1.00)
Calcium: 10.3 mg/dL (ref 8.9–10.3)
Chloride: 102 mmol/L (ref 101–111)
GFR calc non Af Amer: 50 mL/min — ABNORMAL LOW (ref >60)
GFR, EST AFRICAN AMERICAN: 58 mL/min — AB (ref >60)
GLUCOSE: 100 mg/dL — AB (ref 65–99)
POTASSIUM: 4.2 mmol/L (ref 3.5–5.1)
SODIUM: 139 mmol/L (ref 135–145)

## 2015-01-03 LAB — CBC
HEMATOCRIT: 39.5 % (ref 36.0–46.0)
HEMOGLOBIN: 13.3 g/dL (ref 12.0–15.0)
MCH: 29.9 pg (ref 26.0–34.0)
MCHC: 33.7 g/dL (ref 30.0–36.0)
MCV: 88.8 fL (ref 78.0–100.0)
PLATELETS: 281 10*3/uL (ref 150–400)
RBC: 4.45 MIL/uL (ref 3.87–5.11)
RDW: 13.5 % (ref 11.5–15.5)
WBC: 6 10*3/uL (ref 4.0–10.5)

## 2015-01-03 LAB — SURGICAL PCR SCREEN
MRSA, PCR: NEGATIVE
Staphylococcus aureus: NEGATIVE

## 2015-01-03 NOTE — Pre-Procedure Instructions (Signed)
    DANIJELA VESSEY  01/03/2015      [facility] [address] [address] Phone: [phone] Fax: [phone]  [id number]: [phone]: [phone] [facility] [place]Phone: [phone]: [phone]   Your procedure is scheduled on 01-05-2015   Thursday   [facility]   Call this number if you have problems the morning of surgery:  [phone]   Remember:  Do not eat food or drink liquids after midnight.   Take these medicines the morning of surgery with A SIP OF WATER Tramadol if needed   Do not wear jewelry, make-up or nail polish.  Do not wear lotions, powders, or perfumes.    Do not shave 48 hours prior to surgery.   .  Do not bring valuables to the hospital.  [facility] for any belongings or [facility]ables.  Contacts, dentures or bridgework may not be worn into surgery.  Leave your suitcase in the car.  After surgery it may be brought to your room.  For patients admitted to the hospital, discharge time will be determined by your treatment team.  .    Special instructions:  See attached sheet "Preparing for Surgery" for instructions on CHG shower/bath  Please read over the following fact sheets that you were given. Pain Booklet and Surgical Site Infection Prevention

## 2015-01-03 NOTE — Progress Notes (Signed)
Pt. Denies any recent chest pain or discomfort.  Dr.Stephen Hunter's office  to fax Medical  Clearance letter.

## 2015-01-05 ENCOUNTER — Ambulatory Visit (HOSPITAL_COMMUNITY): Payer: Medicare Other | Admitting: Anesthesiology

## 2015-01-05 ENCOUNTER — Ambulatory Visit (HOSPITAL_COMMUNITY): Payer: Medicare Other

## 2015-01-05 ENCOUNTER — Inpatient Hospital Stay (HOSPITAL_COMMUNITY)
Admission: RE | Admit: 2015-01-05 | Discharge: 2015-01-06 | DRG: 520 | Disposition: A | Discharge status: Home Health | Payer: Medicare Other | Source: Ambulatory Visit | Attending: Orthopedic Surgery | Admitting: Orthopedic Surgery

## 2015-01-05 ENCOUNTER — Encounter (HOSPITAL_COMMUNITY): Payer: Self-pay | Admitting: Certified Registered Nurse Anesthetist

## 2015-01-05 ENCOUNTER — Encounter (HOSPITAL_COMMUNITY)
Admission: RE | Disposition: A | Discharge status: Home Health | Payer: Medicare Other | Source: Ambulatory Visit | Attending: Orthopedic Surgery

## 2015-01-05 DIAGNOSIS — K219 Gastro-esophageal reflux disease without esophagitis: Secondary | ICD-10-CM | POA: Diagnosis present

## 2015-01-05 DIAGNOSIS — M5116 Intervertebral disc disorders with radiculopathy, lumbar region: Principal | ICD-10-CM | POA: Diagnosis present

## 2015-01-05 DIAGNOSIS — Z79899 Other long term (current) drug therapy: Secondary | ICD-10-CM | POA: Diagnosis not present

## 2015-01-05 DIAGNOSIS — Z419 Encounter for procedure for purposes other than remedying health state, unspecified: Secondary | ICD-10-CM

## 2015-01-05 DIAGNOSIS — E780 Pure hypercholesterolemia: Secondary | ICD-10-CM | POA: Diagnosis present

## 2015-01-05 DIAGNOSIS — M4806 Spinal stenosis, lumbar region: Secondary | ICD-10-CM | POA: Diagnosis present

## 2015-01-05 DIAGNOSIS — Z87891 Personal history of nicotine dependence: Secondary | ICD-10-CM | POA: Diagnosis not present

## 2015-01-05 DIAGNOSIS — Z8249 Family history of ischemic heart disease and other diseases of the circulatory system: Secondary | ICD-10-CM

## 2015-01-05 DIAGNOSIS — M5126 Other intervertebral disc displacement, lumbar region: Secondary | ICD-10-CM

## 2015-01-05 DIAGNOSIS — K589 Irritable bowel syndrome without diarrhea: Secondary | ICD-10-CM | POA: Diagnosis present

## 2015-01-05 DIAGNOSIS — Z85118 Personal history of other malignant neoplasm of bronchus and lung: Secondary | ICD-10-CM | POA: Diagnosis not present

## 2015-01-05 DIAGNOSIS — M545 Low back pain: Secondary | ICD-10-CM | POA: Diagnosis present

## 2015-01-05 DIAGNOSIS — G4700 Insomnia, unspecified: Secondary | ICD-10-CM | POA: Diagnosis present

## 2015-01-05 HISTORY — DX: Other intervertebral disc displacement, lumbar region: M51.26

## 2015-01-05 HISTORY — PX: LUMBAR LAMINECTOMY/DECOMPRESSION MICRODISCECTOMY: SHX5026

## 2015-01-05 SURGERY — LUMBAR LAMINECTOMY/DECOMPRESSION MICRODISCECTOMY 1 LEVEL
Anesthesia: General | Site: Spine Lumbar | Laterality: Left

## 2015-01-05 MED ORDER — DEXAMETHASONE SODIUM PHOSPHATE 4 MG/ML IJ SOLN
INTRAMUSCULAR | Status: DC | PRN
  Administered 2015-01-05: 8 mg via INTRAVENOUS

## 2015-01-05 MED ORDER — BUPIVACAINE-EPINEPHRINE (PF) 0.25% -1:200000 IJ SOLN
INTRAMUSCULAR | Status: AC
  Filled 2015-01-05: qty 30

## 2015-01-05 MED ORDER — HYDROCODONE-ACETAMINOPHEN 10-325 MG PO TABS: 1.0000 | ORAL_TABLET | Freq: Four times a day (QID) | ORAL | Status: DC | PRN

## 2015-01-05 MED ORDER — METHOCARBAMOL 500 MG PO TABS
500.0000 mg | ORAL_TABLET | Freq: Four times a day (QID) | ORAL | Status: DC | PRN
  Administered 2015-01-05 – 2015-01-06 (×2): 500 mg via ORAL
  Filled 2015-01-05: qty 1

## 2015-01-05 MED ORDER — LACTATED RINGERS IV SOLN
INTRAVENOUS | Status: DC
  Administered 2015-01-05 (×4): via INTRAVENOUS
  Administered 2015-01-06: 50 mL/h via INTRAVENOUS

## 2015-01-05 MED ORDER — ACETAMINOPHEN 10 MG/ML IV SOLN
1000.0000 mg | Freq: Four times a day (QID) | INTRAVENOUS | Status: DC
  Administered 2015-01-05 – 2015-01-06 (×3): 1000 mg via INTRAVENOUS
  Filled 2015-01-05 (×6): qty 100

## 2015-01-05 MED ORDER — FENTANYL CITRATE (PF) 100 MCG/2ML IJ SOLN
INTRAMUSCULAR | Status: DC
Start: 2015-01-05 — End: 2015-01-05
  Filled 2015-01-05: qty 2

## 2015-01-05 MED ORDER — BUPIVACAINE-EPINEPHRINE 0.25% -1:200000 IJ SOLN
INTRAMUSCULAR | Status: DC | PRN
  Administered 2015-01-05: 10 mL

## 2015-01-05 MED ORDER — LACTATED RINGERS IV SOLN: INTRAVENOUS | Status: DC

## 2015-01-05 MED ORDER — POLYETHYLENE GLYCOL 3350 17 G PO PACK: 17.0000 g | PACK | Freq: Every day | ORAL | Status: DC | PRN

## 2015-01-05 MED ORDER — CEFAZOLIN SODIUM 1-5 GM-% IV SOLN
1.0000 g | Freq: Three times a day (TID) | INTRAVENOUS | Status: AC
  Administered 2015-01-05 – 2015-01-06 (×2): 1 g via INTRAVENOUS
  Filled 2015-01-05 (×2): qty 50

## 2015-01-05 MED ORDER — ZOLPIDEM TARTRATE 5 MG PO TABS
5.0000 mg | ORAL_TABLET | Freq: Every evening | ORAL | Status: DC | PRN
  Administered 2015-01-05: 5 mg via ORAL
  Filled 2015-01-05: qty 1

## 2015-01-05 MED ORDER — FENTANYL CITRATE (PF) 250 MCG/5ML IJ SOLN
INTRAMUSCULAR | Status: AC
  Filled 2015-01-05: qty 5

## 2015-01-05 MED ORDER — THROMBIN 20000 UNITS EX SOLR
CUTANEOUS | Status: AC
  Filled 2015-01-05: qty 20000

## 2015-01-05 MED ORDER — ROCURONIUM BROMIDE 100 MG/10ML IV SOLN
INTRAVENOUS | Status: DC | PRN
  Administered 2015-01-05: 10 mg via INTRAVENOUS
  Administered 2015-01-05: 50 mg via INTRAVENOUS

## 2015-01-05 MED ORDER — SODIUM CHLORIDE 0.9 % IJ SOLN: 3.0000 mL | INTRAMUSCULAR | Status: DC | PRN

## 2015-01-05 MED ORDER — HEMOSTATIC AGENTS (NO CHARGE) OPTIME
TOPICAL | Status: DC | PRN
  Administered 2015-01-05: 1 via TOPICAL

## 2015-01-05 MED ORDER — PHENYLEPHRINE HCL 10 MG/ML IJ SOLN
INTRAMUSCULAR | Status: DC | PRN
  Administered 2015-01-05 (×2): 40 ug via INTRAVENOUS

## 2015-01-05 MED ORDER — PHENOL 1.4 % MT LIQD: 1.0000 | OROMUCOSAL | Status: DC | PRN

## 2015-01-05 MED ORDER — ONDANSETRON HCL 4 MG/2ML IJ SOLN
INTRAMUSCULAR | Status: DC | PRN
  Administered 2015-01-05: 4 mg via INTRAVENOUS

## 2015-01-05 MED ORDER — SODIUM CHLORIDE 0.9 % IV SOLN
250.0000 mL | INTRAVENOUS | Status: DC
Start: 2015-01-05 — End: 2015-01-06

## 2015-01-05 MED ORDER — FENTANYL CITRATE (PF) 100 MCG/2ML IJ SOLN
INTRAMUSCULAR | Status: AC
  Filled 2015-01-05: qty 2

## 2015-01-05 MED ORDER — SODIUM CHLORIDE 0.9 % IJ SOLN: 3.0000 mL | Freq: Two times a day (BID) | INTRAMUSCULAR | Status: DC

## 2015-01-05 MED ORDER — DROPERIDOL 2.5 MG/ML IJ SOLN: 0.6250 mg | INTRAMUSCULAR | Status: DC | PRN

## 2015-01-05 MED ORDER — NEOSTIGMINE METHYLSULFATE 10 MG/10ML IV SOLN
INTRAVENOUS | Status: DC | PRN
  Administered 2015-01-05: 5 mg via INTRAVENOUS

## 2015-01-05 MED ORDER — ALUM & MAG HYDROXIDE-SIMETH 200-200-20 MG/5ML PO SUSP: 30.0000 mL | Freq: Four times a day (QID) | ORAL | Status: DC | PRN

## 2015-01-05 MED ORDER — ZOLPIDEM TARTRATE 5 MG PO TABS: 10.0000 mg | ORAL_TABLET | Freq: Every evening | ORAL | Status: DC | PRN

## 2015-01-05 MED ORDER — CEFAZOLIN SODIUM-DEXTROSE 2-3 GM-% IV SOLR
INTRAVENOUS | Status: DC | PRN
  Administered 2015-01-05: 2 g via INTRAVENOUS

## 2015-01-05 MED ORDER — METHOCARBAMOL 500 MG PO TABS: 500.0000 mg | ORAL_TABLET | Freq: Three times a day (TID) | ORAL | Status: DC | PRN

## 2015-01-05 MED ORDER — PROPOFOL 10 MG/ML IV BOLUS
INTRAVENOUS | Status: DC | PRN
  Administered 2015-01-05: 200 mg via INTRAVENOUS

## 2015-01-05 MED ORDER — METHOCARBAMOL 1000 MG/10ML IJ SOLN
500.0000 mg | Freq: Four times a day (QID) | INTRAVENOUS | Status: DC | PRN
  Filled 2015-01-05: qty 5

## 2015-01-05 MED ORDER — HYDROCODONE-ACETAMINOPHEN 10-325 MG PO TABS
1.0000 | ORAL_TABLET | Freq: Four times a day (QID) | ORAL | Status: DC | PRN
  Administered 2015-01-06 (×2): 1 via ORAL
  Filled 2015-01-05 (×2): qty 1

## 2015-01-05 MED ORDER — FENTANYL CITRATE (PF) 100 MCG/2ML IJ SOLN
25.0000 ug | INTRAMUSCULAR | Status: DC | PRN
  Administered 2015-01-05: 25 ug via INTRAVENOUS
  Administered 2015-01-05: 50 ug via INTRAVENOUS
  Administered 2015-01-05: 25 ug via INTRAVENOUS

## 2015-01-05 MED ORDER — MIDAZOLAM HCL 5 MG/5ML IJ SOLN
INTRAMUSCULAR | Status: DC | PRN
  Administered 2015-01-05: 2 mg via INTRAVENOUS

## 2015-01-05 MED ORDER — METHOCARBAMOL 500 MG PO TABS
ORAL_TABLET | ORAL | Status: AC
  Filled 2015-01-05: qty 1

## 2015-01-05 MED ORDER — GLYCOPYRROLATE 0.2 MG/ML IJ SOLN
INTRAMUSCULAR | Status: DC | PRN
  Administered 2015-01-05: 0.6 mg via INTRAVENOUS

## 2015-01-05 MED ORDER — ONDANSETRON HCL 4 MG PO TABS: 4.0000 mg | ORAL_TABLET | Freq: Three times a day (TID) | ORAL | Status: DC | PRN

## 2015-01-05 MED ORDER — THROMBIN 20000 UNITS EX KIT
PACK | CUTANEOUS | Status: DC | PRN
  Administered 2015-01-05: 20 mL via TOPICAL

## 2015-01-05 MED ORDER — 0.9 % SODIUM CHLORIDE (POUR BTL) OPTIME
TOPICAL | Status: DC | PRN
  Administered 2015-01-05: 1000 mL

## 2015-01-05 MED ORDER — MIDAZOLAM HCL 2 MG/2ML IJ SOLN
INTRAMUSCULAR | Status: AC
  Filled 2015-01-05: qty 2

## 2015-01-05 MED ORDER — FENTANYL CITRATE (PF) 100 MCG/2ML IJ SOLN
INTRAMUSCULAR | Status: DC | PRN
  Administered 2015-01-05: 50 ug via INTRAVENOUS
  Administered 2015-01-05: 100 ug via INTRAVENOUS

## 2015-01-05 MED ORDER — LIDOCAINE HCL (CARDIAC) 20 MG/ML IV SOLN
INTRAVENOUS | Status: DC | PRN
  Administered 2015-01-05: 50 mg via INTRAVENOUS

## 2015-01-05 MED ORDER — MENTHOL 3 MG MT LOZG: 1.0000 | LOZENGE | OROMUCOSAL | Status: DC | PRN

## 2015-01-05 MED ORDER — SCOPOLAMINE 1 MG/3DAYS TD PT72
1.0000 | MEDICATED_PATCH | Freq: Once | TRANSDERMAL | Status: DC
  Administered 2015-01-05: 1.5 mg via TRANSDERMAL
  Filled 2015-01-05: qty 1

## 2015-01-05 MED ORDER — ONDANSETRON HCL 4 MG/2ML IJ SOLN
4.0000 mg | INTRAMUSCULAR | Status: DC | PRN
  Filled 2015-01-05: qty 2

## 2015-01-05 MED ORDER — PROPOFOL 10 MG/ML IV BOLUS
INTRAVENOUS | Status: AC
  Filled 2015-01-05: qty 20

## 2015-01-05 SURGICAL SUPPLY — 58 items
BUR EGG ELITE 4.0 (BURR) IMPLANT
BUR MATCHSTICK NEURO 3.0 LAGG (BURR) IMPLANT
CANISTER SUCTION 2500CC (MISCELLANEOUS) ×2 IMPLANT
CLSR STERI-STRIP ANTIMIC 1/2X4 (GAUZE/BANDAGES/DRESSINGS) ×2 IMPLANT
CORDS BIPOLAR (ELECTRODE) ×2 IMPLANT
COVER SURGICAL LIGHT HANDLE (MISCELLANEOUS) ×2 IMPLANT
DRAIN CHANNEL 15F RND FF W/TCR (WOUND CARE) IMPLANT
DRAPE POUCH INSTRU U-SHP 10X18 (DRAPES) ×2 IMPLANT
DRAPE SURG 17X23 STRL (DRAPES) ×2 IMPLANT
DRAPE U-SHAPE 47X51 STRL (DRAPES) ×2 IMPLANT
DRSG MEPILEX BORDER 4X4 (GAUZE/BANDAGES/DRESSINGS) ×2 IMPLANT
DRSG MEPILEX BORDER 4X8 (GAUZE/BANDAGES/DRESSINGS) ×2 IMPLANT
DURAPREP 26ML APPLICATOR (WOUND CARE) ×2 IMPLANT
ELECT BLADE 4.0 EZ CLEAN MEGAD (MISCELLANEOUS) ×2
ELECT CAUTERY BLADE 6.4 (BLADE) ×2 IMPLANT
ELECT PENCIL ROCKER SW 15FT (MISCELLANEOUS) ×2 IMPLANT
ELECT REM PT RETURN 9FT ADLT (ELECTROSURGICAL) ×2
ELECTRODE BLDE 4.0 EZ CLN MEGD (MISCELLANEOUS) ×1 IMPLANT
ELECTRODE REM PT RTRN 9FT ADLT (ELECTROSURGICAL) ×1 IMPLANT
EVACUATOR SILICONE 100CC (DRAIN) IMPLANT
GLOVE BIOGEL PI IND STRL 8 (GLOVE) ×1 IMPLANT
GLOVE BIOGEL PI IND STRL 8.5 (GLOVE) ×1 IMPLANT
GLOVE BIOGEL PI INDICATOR 8 (GLOVE) ×1
GLOVE BIOGEL PI INDICATOR 8.5 (GLOVE) ×1
GLOVE ORTHO TXT STRL SZ7.5 (GLOVE) ×2 IMPLANT
GLOVE SS BIOGEL STRL SZ 8.5 (GLOVE) ×1 IMPLANT
GLOVE SUPERSENSE BIOGEL SZ 8.5 (GLOVE) ×1
GOWN STRL REUS W/TWL 2XL LVL3 (GOWN DISPOSABLE) ×4 IMPLANT
KIT BASIN OR (CUSTOM PROCEDURE TRAY) ×2 IMPLANT
KIT ROOM TURNOVER OR (KITS) ×2 IMPLANT
LIGHT SOURCE ANGLE TIP STR 7FT (MISCELLANEOUS) ×2 IMPLANT
NEEDLE 22X1 1/2 (OR ONLY) (NEEDLE) ×2 IMPLANT
NEEDLE SPNL 18GX3.5 QUINCKE PK (NEEDLE) ×4 IMPLANT
NS IRRIG 1000ML POUR BTL (IV SOLUTION) ×2 IMPLANT
PACK LAMINECTOMY ORTHO (CUSTOM PROCEDURE TRAY) ×2 IMPLANT
PACK UNIVERSAL I (CUSTOM PROCEDURE TRAY) ×2 IMPLANT
PAD ARMBOARD 7.5X6 YLW CONV (MISCELLANEOUS) ×4 IMPLANT
PATTIES SURGICAL .5 X.5 (GAUZE/BANDAGES/DRESSINGS) IMPLANT
PATTIES SURGICAL .5 X1 (DISPOSABLE) ×2 IMPLANT
SPONGE SURGIFOAM ABS GEL 100 (HEMOSTASIS) ×2 IMPLANT
STRIP CLOSURE SKIN 1/2X4 (GAUZE/BANDAGES/DRESSINGS) ×2 IMPLANT
SURGIFLO W/THROMBIN 8M KIT (HEMOSTASIS) ×2 IMPLANT
SUT BONE WAX W31G (SUTURE) ×2 IMPLANT
SUT MON AB 3-0 SH 27 (SUTURE) ×1
SUT MON AB 3-0 SH27 (SUTURE) ×1 IMPLANT
SUT VIC AB 0 CT1 27 (SUTURE)
SUT VIC AB 0 CT1 27XBRD ANBCTR (SUTURE) IMPLANT
SUT VIC AB 1 CT1 18XCR BRD 8 (SUTURE) ×1 IMPLANT
SUT VIC AB 1 CT1 8-18 (SUTURE) ×1
SUT VIC AB 1 CTX 36 (SUTURE)
SUT VIC AB 1 CTX36XBRD ANBCTR (SUTURE) IMPLANT
SUT VIC AB 2-0 CT1 18 (SUTURE) ×2 IMPLANT
SYR BULB IRRIGATION 50ML (SYRINGE) ×2 IMPLANT
SYR CONTROL 10ML LL (SYRINGE) ×2 IMPLANT
TOWEL OR 17X24 6PK STRL BLUE (TOWEL DISPOSABLE) ×2 IMPLANT
TOWEL OR 17X26 10 PK STRL BLUE (TOWEL DISPOSABLE) ×2 IMPLANT
TRAY CATH 16FR W/PLASTIC CATH (SET/KITS/TRAYS/PACK) ×2 IMPLANT
YANKAUER SUCT BULB TIP NO VENT (SUCTIONS) ×2 IMPLANT

## 2015-01-05 NOTE — Anesthesia Procedure Notes (Signed)
Procedure Name: Intubation Date/Time: 01/05/2015 1:52 PM Performed by: Vennie Homans Pre-anesthesia Checklist: Patient identified, Timeout performed, Emergency Drugs available, Suction available and Patient being monitored Patient Re-evaluated:Patient Re-evaluated prior to inductionOxygen Delivery Method: Circle system utilized Preoxygenation: Pre-oxygenation with 100% oxygen Intubation Type: IV induction Ventilation: Mask ventilation without difficulty Laryngoscope Size: Mac and 3 Grade View: Grade II Tube type: Oral Tube size: 7.5 mm Number of attempts: 1 Airway Equipment and Method: Stylet Placement Confirmation: ETT inserted through vocal cords under direct vision,  positive ETCO2 and breath sounds checked- equal and bilateral Secured at: 21 cm Tube secured with: Tape Dental Injury: Teeth and Oropharynx as per pre-operative assessment

## 2015-01-05 NOTE — Brief Op Note (Signed)
01/05/2015  4:08 PM  PATIENT:  Krystal Craig  81 y.o. female  PRE-OPERATIVE DIAGNOSIS:  L3-4 DISC HERNIATION  POST-OPERATIVE DIAGNOSIS:  L3-4 DISC HERNIATION  PROCEDURE:  Procedure(s) with comments: L3-4 DECOMPRESSION MICRODISCECTOMY ON LEFT (Left) - left Lumbar 3-4  SURGEON:  Surgeon(s) and Role:    * Melina Schools, MD - Primary  PHYSICIAN ASSISTANT:   ASSISTANTS: none   ANESTHESIA:   general  EBL:  Total I/O In: 2000 [I.V.:2000] Out: 200 [Blood:200]  BLOOD ADMINISTERED:none  DRAINS: none   LOCAL MEDICATIONS USED:  MARCAINE     SPECIMEN:  No Specimen  DISPOSITION OF SPECIMEN:  N/A  COUNTS:  YES  TOURNIQUET:  * No tourniquets in log *  DICTATION: .Other Dictation: Dictation Number [id number]  PLAN OF CARE: Admit to inpatient   PATIENT DISPOSITION:  PACU - hemodynamically stable.

## 2015-01-05 NOTE — Anesthesia Postprocedure Evaluation (Signed)
Anesthesia Post Note  Patient: Krystal Craig  Procedure(s) Performed: Procedure(s) (LRB): L3-4 DECOMPRESSION MICRODISCECTOMY ON LEFT (Left)  Anesthesia type: general  Patient location: PACU  Post pain: Pain level controlled  Post assessment: Patient's Cardiovascular Status Stable  Last Vitals:  Filed Vitals:   01/05/15 1713  BP:   Pulse: 61  Temp: 36.6 C  Resp: 14    Post vital signs: Reviewed and stable  Level of consciousness: sedated  Complications: No apparent anesthesia complications

## 2015-01-05 NOTE — Transfer of Care (Signed)
Immediate Anesthesia Transfer of Care Note  Patient: Krystal Craig  Procedure(s) Performed: Procedure(s) with comments: L3-4 DECOMPRESSION MICRODISCECTOMY ON LEFT (Left) - left Lumbar 3-4  Patient Location: PACU  Anesthesia Type:General  Level of Consciousness: awake, alert , oriented, patient cooperative and responds to stimulation  Airway & Oxygen Therapy: Patient Spontanous Breathing and Patient connected to nasal cannula oxygen  Post-op Assessment: Report given to RN, Post -op Vital signs reviewed and stable, Patient moving all extremities X 4 and Patient able to stick tongue midline  Post vital signs: stable  Last Vitals:  Filed Vitals:   01/05/15 1036  BP: 164/73  Pulse: 83  Temp: 36.7 C  Resp: 20    Complications: No apparent anesthesia complications

## 2015-01-05 NOTE — Op Note (Signed)
Krystal Craig, Krystal Craig              ACCOUNT NO.:  [id number]  MEDICAL RECORD NO.:  [id number]  LOCATION:  [id number]                        FACILITY:  [facility]  PHYSICIAN:  Garry Nicolini D. Rolena Infante, M.D. DATE OF BIRTH:  02/13/1933  DATE OF PROCEDURE:  01/05/2015 DATE OF DISCHARGE:                              OPERATIVE REPORT   PREOPERATIVE DIAGNOSIS:  Left L3-4 disk herniations with lumbar radiculopathy, left side.  POSTOPERATIVE DIAGNOSIS:  Left L3-4 disk herniations with lumbar radiculopathy, left side.  OPERATIVE PROCEDURE:  L3-4 left microdiskectomy, CPT code [id number].  COMPLICATIONS:  None.  CONDITION:  Stable.  HISTORY:  This is a very pleasant 81 year old woman who had acute onset of severe left radicular leg pain.  Imaging studies confirmed the disk herniation with nerve compression.  Attempts at conservative management failed, her quality of life continue to deteriorate, so, elected to proceed with surgery.  All appropriate risks, benefits, and alternatives were discussed with the patient and consent was obtained.  OPERATIVE NOTE:  The patient was brought to the operating room and placed supine on the operating table.  After successful induction of general anesthesia and endotracheal intubation, TEDs and SCDs were applied.  She was turned prone onto the Wilson frame, all bony prominences were well padded and the back was prepped and draped in a standard fashion.  Time-out was taken confirming patient, procedure and all other pertinent important data.  Tuohy needles were placed in the back.  A fluoro view was taken to confirm the level.  I infiltrated the proposed skin incision with 0.25% Marcaine and incised the posterior spine from the midbody of 3 to the midbody of 4.  Sharp dissection was carried out down to the deep fascia.  I incised the deep fascia and then mobilized the paraspinal muscles to expose the L3 lamina.  The self-retaining NuVasive MIS retractor was placed into the  wound.  The fins were distracted and I placed the Penfield 4 underneath the L4 lamina.  Once I confirmed this, I then began performed a laminotomy with a 3 and 2-mm Kerrison punch. Once I had an adequate laminotomy performed, I then developed a plane, I dissected through the ligamentum flavum with a Penfield 4 and then resected the ligamentum flavum.  I then went into the lateral recess, removing the osteophyte.  At this point, I could palpate the L4 pedicle and identified the 4 nerve root.  I swept this medially and then I was able to work superiorly from this to identify the L3-4 disk space.  I then placed neural patties to protect the thecal sac above and the nerve root below and exposed the posterolateral spine.  A D'Errico nerve root retractor was placed medially and identified the large epidural veins and coagulated them.  Once they were coagulated, I incised the annulus with a 15 blade scalpel, then using a combination of pituitary rongeurs, Kerrison rongeurs and nerve hooks.  I removed the hard disk osteophyte that had formed at the level.  I then used an Epstein to further decompress and remove the osteophyte.  Once this was completed, I could now take my [facility] and pass it into the lateral recess without difficulty superiorly to the  L3 foramen and out the L3 foramen without difficulty and inferiorly down to the L4 foramen without difficulty.  My nerve hook was underneath the thecal sac centrally and there was further no issue.  At this point in time, I was pleased with the decompression. Once I had hemostasis obtained using bipolar electrocautery and FloSeal, I irrigated the wound copiously with normal saline.  Once this was done, I then placed a thrombin-soaked Gelfoam patty over the exposed thecal sac.  I then closed the deep fascia with interrupted #1 Vicryl sutures, superficial with 2-0 Vicryl sutures, and 3-0 Monocryl for the skin. Steri-Strips and dry dressing  were applied.  An in-and-out catheterization was done to assess volume at the request of the anesthesiologist.  Once this was completed, she was extubated and transferred to the PACU without incident.  At the end of the case, all needle and sponge counts were correct.  There were no adverse intraoperative events.     Charlcie Prisco D. Rolena Infante, M.D.   ______________________________ Blake Divine. Rolena Infante, M.D.    DDB/MEDQ  D:  01/05/2015  T:  01/05/2015  Job:  [id number]

## 2015-01-05 NOTE — Progress Notes (Addendum)
Patient requesting zolpidem 10 mg PO that is on her PTA medication list. Notified operator via [place] for orders. Will continue to monitor and update at this time.   Orders received by on-call physician. Pharmacy reduced order of Zolpidem 10 mg PO to 5 mg PO because of hospital's policy. Will notify patient. Will continue to monitor at this time.

## 2015-01-05 NOTE — Anesthesia Preprocedure Evaluation (Addendum)
Anesthesia Evaluation  Patient identified by MRN, date of birth, ID band Patient awake    Reviewed: Allergy & Precautions, NPO status , Patient's Chart, lab work & pertinent test results  History of Anesthesia Complications (+) PONV and history of anesthetic complications  Airway Mallampati: II  TM Distance: >3 FB Neck ROM: Full    Dental  (+) Chipped, Dental Advisory Given   Pulmonary former smoker (quit 1986),  H/o lung cancer: LLLobectomy breath sounds clear to auscultation        Cardiovascular - anginaRhythm:Regular Rate:Normal  '13 stress myoview: normal   Neuro/Psych Chronic back pain    GI/Hepatic Neg liver ROS, GERD-  Medicated and Controlled,  Endo/Other  negative endocrine ROS  Renal/GU negative Renal ROS     Musculoskeletal  (+) Arthritis -, Osteoarthritis,    Abdominal   Peds  Hematology negative hematology ROS (+)   Anesthesia Other Findings   Reproductive/Obstetrics                           Anesthesia Physical Anesthesia Plan  ASA: III  Anesthesia Plan: General   Post-op Pain Management:    Induction: Intravenous  Airway Management Planned: Oral ETT  Additional Equipment:   Intra-op Plan:   Post-operative Plan: Extubation in OR  Informed Consent: I have reviewed the patients History and Physical, chart, labs and discussed the procedure including the risks, benefits and alternatives for the proposed anesthesia with the patient or authorized representative who has indicated his/her understanding and acceptance.   Dental advisory given  Plan Discussed with: CRNA and Surgeon  Anesthesia Plan Comments: (Plan routine monitors, GETA)        Anesthesia Quick Evaluation

## 2015-01-05 NOTE — H&P (Signed)
History of Present Illness  The patient is a 81 year old female who comes in today for a preoperative History and Physical. The patient is scheduled for a L3-4 Lumbar Decompression/discectomy left to be performed by Dr. Duane Lope D. Rolena Infante, MD at [facility] on 01/05/2015 . Please see the hospital record for complete dictated history and physical. Pt has a hx of Lung Cancer in 2006. The pt has 20 % of her left lung removed. The pt reports getting good reports since that time.  Additional reasons for visit:  Follow-up back is described as the following: The patient is being followed for their back pain. They are now 4 day(s) (12-09-14) out from left L3-4 SNRB. Symptoms reported today include: pain ("I have had more pain this week in the back and the leg), aching, throbbing and leg pain (left). The patient states that they are doing poorly. The patient reports their current pain level to be 5 / 10 (right now). The patient presents today following SNRB. Note for "Follow-up back": The patient has Rx for Percocet, vicodin and prednisone and states these do not help and she has stopped.  Subjective Transcription CLINICAL PLAN At this point in time, we had the opportunity to review all the risks and benefits of the discectomy. I demonstrated the anatomy on the lumbar spine model as well as using the MRI to inform them about the surgery. All of her, her husbands, and her son's questions were addressed.    Allergies CODEINE09/08/1996  Family History Cancer sister Heart Disease mother  Social History  Drug/Alcohol Rehab (Previously) no Drug/Alcohol Rehab (Currently) no Tobacco use former smoker; smoke(d) 1 pack(s) per day Alcohol use current drinker; drinks wine; only occasionally per week Children 2 Marital status married Living situation live with spouse Number of flights of stairs before winded 4-5 Current work status retired Illicit drug use no  Medication History   Simvastatin ('40MG'$  Tablet, Oral) Active. (qd) TraMADol HCl ('50MG'$  Tablet, Oral) Active. (bid) Calcium Carbonate ('600MG'$  Tablet, Oral two times daily) Active. Aspirin Low Strength ('81MG'$  Tablet Chewable, Oral) Active. (qd) Tylenol Extra Strength ('500MG'$  Tablet, Oral every six hours) Active. Ambien ('10MG'$  Tablet, Oral) Active. (qhs) Medications Reconciled  Vitals  12/30/2014 7:54 AM Pulse: 92 (Regular)  BP: 136/88 (Sitting, Left Arm, Standard)  Physical Exam  General General Appearance-Not in acute distress. Orientation-Oriented X3. Build & Nutrition-Well nourished and Well developed.  Integumentary General Characteristics Surgical Scars - no surgical scar evidence of previous lumbar surgery. Lumbar Spine-Skin examination of the lumbar spine is without deformity, skin lesions, lacerations or abrasions.  Chest and Lung Exam Auscultation Breath sounds - Normal and Clear.  Cardiovascular Auscultation Rhythm - Regular rate and rhythm.  Abdomen Palpation/Percussion Palpation and Percussion of the abdomen reveal - Soft, Non Tender and No Rebound tenderness.  Peripheral Vascular Lower Extremity Palpation - Posterior tibial pulse - Bilateral - 2+. Dorsalis pedis pulse - Bilateral - 2+.  Neurologic Sensation Lower Extremity - Bilateral - sensation is intact in the lower extremity. Reflexes Patellar Reflex - Bilateral - 2+. Achilles Reflex - Bilateral - 2+. Clonus - Bilateral - clonus not present. Hoffman's Sign - Bilateral - Hoffman's sign not present. Testing Seated Straight Leg Raise - Bilateral - Seated straight leg raise negative.  Musculoskeletal Spine/Ribs/Pelvis  Lumbosacral Spine: Inspection and Palpation - Tenderness - left lumbar paraspinals tender to palpation, right lumbar paraspinals tender to palpation, left buttock is tender to palpation and right buttock is tender to palpation. Strength and Tone: Strength -  Hip Flexion - Left - 3/5. Right - 5/5.  Knee Extension - Left - 3-/5. Right - 5/5. Knee Flexion - Bilateral - 5/5. Ankle Dorsiflexion - Bilateral - 5/5. Ankle Plantarflexion - Bilateral - 5/5. Heel walk - Bilateral - unable to heel walk. Toe Walk - Bilateral - unable to walk on toes. Heel-Toe Walk - Bilateral - unable to heel-toe walk. ROM - Flexion - moderately decreased range of motion. Extension - full range of motion and pain-free. Left Lateral Bending - moderately decreased range of motion and painful. Right Lateral Bending - moderately decreased range of motion and painful. Pain - neither flexion or extension is more painful than the other. Lumbosacral Spine - Waddell's Signs - no Waddell's signs present. Lower Extremity Range of Motion - No true hip, knee or ankle pain with range of motion. Gait and Station - [employer] - no assistive devices.  The MRI was re-reviewed. She does have an L3-4 left lateral disc herniation causing stenosis and there is a foraminal extension of the disc fragment as well. Therefore, both the L3 and traversing L4 nerve roots are being irritated.  Assessment & Plan  Goal Of Surgery:Discussed that goal of surgery is to reduce pain and improve function and quality of life. Patient is aware that despite all appropriate treatment that there pain and function could be the same, worse, or different. Posterior Lumbar Decompression/disectomy: Risks of surgery include infection, bleeding, nerve damage, death, stroke, paralysis, failure to heal, need for further surgery, ongoing or worse pain, need for further surgery, CSF leak, loss of bowel or bladder, and recurrent disc herniation or Stenosis which would necessitate need for further surgery.  At this point in time, clinically despite injection therapy, pain medications, she is having deteriorating neurological function, horrific pain and poor quality of life. For this reason, I think it is worthwhile abandoning conservative care, moving forward with the surgical  solution. The procedure of choice will be a L3-4 left sided decompression and discectomy. Because of the foraminal extension to this, I may end up doing more of a L3 laminectomy rather than an L3 laminotomy so that I could adequately visualize both the exiting three nerve root as well the traversing four nerve root and make sure the entire disc fragment is removed. The risks of surgery include infection, bleeding, nerve damage, death, stroke, paralysis, failure to heal, need for further surgery, ongoing or worse pain, recurrent disc herniation, need for further surgery. All of her husband and son and her questions were addressed.

## 2015-01-06 ENCOUNTER — Encounter (HOSPITAL_COMMUNITY): Payer: Self-pay | Admitting: Orthopedic Surgery

## 2015-01-06 MED FILL — Thrombin For Soln 20000 Unit: CUTANEOUS | Qty: 1 | Status: AC

## 2015-01-06 NOTE — Progress Notes (Signed)
    Subjective: Procedure(s) (LRB): L3-4 DECOMPRESSION MICRODISCECTOMY ON LEFT (Left) 1 Day Post-Op  Patient reports pain as 3 on 0-10 scale.  Reports decreased leg pain reports incisional back pain   Positive void Negative bowel movement Positive flatus Negative chest pain or shortness of breath  Objective: Vital signs in last 24 hours: Temp:  [97 F (36.1 C)-99.3 F (37.4 C)] 98.4 F (36.9 C) (07/22 0539) Pulse Rate:  [61-83] 69 (07/22 0539) Resp:  [14-20] 18 (07/22 0539) BP: (114-164)/(47-73) 114/56 mmHg (07/22 0539) SpO2:  [98 %-100 %] 98 % (07/22 0539) Weight:  [63.59 kg (140 lb 3.1 oz)] 63.59 kg (140 lb 3.1 oz) (07/21 1740)  Intake/Output from previous day: 07/21 0701 - 07/22 0700 In: 2350 [P.O.:50; I.V.:2300] Out: 575 [Urine:375; Blood:200]  Labs:  Recent Labs  01/03/15 1249  WBC 6.0  RBC 4.45  HCT 39.5  PLT 281    Recent Labs  01/03/15 1249  NA 139  K 4.2  CL 102  CO2 27  BUN 14  CREATININE 1.03*  GLUCOSE 100*  CALCIUM 10.3   No results for input(s): LABPT, INR in the last 72 hours.  Physical Exam: Neurologically intact ABD soft Intact pulses distally Dorsiflexion/Plantar flexion intact Incision: dressing C/D/I Compartment soft  Assessment/Plan: Patient stable  xrays n/a Continue mobilization with physical therapy Continue care  Advance diet Up with therapy  Plan on d/c today Bowel meds for constipation Leg pain improving.  Some residual neuropathic pain but it is significantly better then pre-op  Melina Schools, MD [place] [phone]

## 2015-01-06 NOTE — Evaluation (Signed)
Physical Therapy Evaluation Patient Details Name: Krystal Craig MRN: [id number] DOB: [birth year] Today's Date: 01/06/2015   History of Present Illness  81 y.o. female s/p L3-4 decompression microdisectomy on L  Clinical Impression  Pt presents with generalized weakness and pain in LLE limiting mobility.  Pt is overall S level and will have this at home.  Recommend HHPT for follow up as well as RW at D/C, however since pt is discharging today, will defer services to [place].     Follow Up Recommendations Home health PT    Equipment Recommendations  Rolling walker with 5" wheels    Recommendations for Other Services       Precautions / Restrictions Precautions Precautions: Back Precaution Booklet Issued: Yes (comment) Precaution Comments: handout provided Required Braces or Orthoses: Spinal Brace Spinal Brace: Lumbar corset;Applied in sitting position Restrictions Weight Bearing Restrictions: No      Mobility  Bed Mobility Overal bed mobility: Needs Assistance Bed Mobility: Sit to Sidelying Rolling: Supervision Sidelying to sit: Supervision     Sit to sidelying: Supervision General bed mobility comments: Min cues for technique, otherwise did at S level with HOB flat and without rails.   Transfers Overall transfer level: Needs assistance Equipment used: Rolling walker (2 wheeled) Transfers: Sit to/from Stand Sit to Stand: Supervision         General transfer comment: Min cues for hand placement  Ambulation/Gait Ambulation/Gait assistance: Supervision Ambulation Distance (Feet): 175 Feet (with seated rest break in between. ) Assistive device: Rolling walker (2 wheeled) Gait Pattern/deviations: Step-through pattern;Decreased step length - left;Decreased stance time - left;Decreased weight shift to left;Trunk flexed;Narrow base of support Gait velocity: decreased   General Gait Details: Pt overall S level for gait, however note forward flexed posture and antalgic  gait pattern due to pain in LLE, however pt making good effort to ambulate with normal gait pattern.   Stairs Stairs: Yes Stairs assistance: Supervision Stair Management: Two rails;Step to pattern;Forwards Number of Stairs: 4 General stair comments: Cues for sequencing and technique  Wheelchair Mobility    Modified Rankin (Stroke Patients Only)       Balance Overall balance assessment: Needs assistance Sitting-balance support: Feet supported;No upper extremity supported Sitting balance-Leahy Scale: Good     Standing balance support: During functional activity;Bilateral upper extremity supported Standing balance-Leahy Scale: Good                               Pertinent Vitals/Pain Pain Assessment: 0-10 Pain Score: 6  Pain Location: L knee, groin Pain Descriptors / Indicators: Aching;Sore Pain Intervention(s): Limited activity within patient's tolerance;Monitored during session;Premedicated before session    [place] expects to be discharged to:: Private residence Living Arrangements: Spouse/significant other Available Help at Discharge: Family;Available 24 hours/day;Friend(s) Type of Home: House Home Access: Stairs to enter Entrance Stairs-Rails: Can reach both;Right;Left (in back) Entrance Stairs-Number of Steps: 2-6 Home Layout: One level Home Equipment: Walker - 4 wheels;Bedside commode      Prior Function Level of Independence: Independent with assistive device(s)         Comments: used shower chair for bathing; spouse set-up for ADLs     Hand Dominance   Dominant Hand: Right    Extremity/Trunk Assessment   Upper Extremity Assessment: Overall WFL for tasks assessed           Lower Extremity Assessment: Generalized weakness;LLE deficits/detail   LLE Deficits / Details: Pt with decreased strength in  LLE due to pain  Cervical / Trunk Assessment: Other exceptions  Communication   Communication: No difficulties   Cognition Arousal/Alertness: Awake/alert Behavior During Therapy: WFL for tasks assessed/performed Overall Cognitive Status: Within Functional Limits for tasks assessed                      General Comments      Exercises        Assessment/Plan    PT Assessment All further PT needs can be met in the next venue of care  PT Diagnosis Difficulty walking;Generalized weakness;Acute pain   PT Problem List Decreased strength;Decreased activity tolerance;Decreased balance;Decreased mobility;Decreased knowledge of precautions;Pain  PT Treatment Interventions DME instruction;Gait training;Stair training;Functional mobility training;Therapeutic activities;Therapeutic exercise;Patient/family education   PT Goals (Current goals can be found in the Care Plan section) Acute Rehab PT Goals Patient Stated Goal: to go home today PT Goal Formulation: All assessment and education complete, DC therapy    Frequency Min 5X/week   Barriers to discharge        Co-evaluation               End of Session Equipment Utilized During Treatment: Back brace Activity Tolerance: Patient limited by pain Patient left: in bed;with call bell/phone within reach Nurse Communication: Mobility status         Time: 3594-0905 PT Time Calculation (min) (ACUTE ONLY): 25 min   Charges:   PT Evaluation $Initial PT Evaluation Tier I: 1 Procedure PT Treatments $Gait Training: 8-22 mins   PT G Codes:        Denice Bors 01/06/2015, 10:44 AM

## 2015-01-06 NOTE — Care Management Note (Signed)
Case Management Note  Patient Details  Name: Krystal Craig MRN: [id number] Date of Birth: 1933-05-03  Subjective/Objective:                    Action/Plan: Met with patient and son to discuss discharge needs. Patient is agreeable to [facility] and has chosen [facility]. Tiffany with [facility] was notified of referral for discharge home today.  [facility] was notified of need for rolling walker prior to discharge. Bedside RN aware.  Expected Discharge Date:   (pending)               Expected Discharge Plan:  [place]  In-House Referral:     Discharge planning Services  CM Consult  Post Acute Care Choice:    Choice offered to:  Patient  DME Arranged:  Walker rolling DME Agency:  [place]:  PT [place] Agency:  [place]  Status of Service:  Completed, signed off  Medicare Important Message Given:    Date Medicare IM Given:    Medicare IM give by:    Date Additional Medicare IM Given:    Additional Medicare Important Message give by:     If discussed at [place] of Stay Meetings, dates discussed:    Additional Comments:  Rolm Baptise, RN 01/06/2015, 1:28 PM

## 2015-01-06 NOTE — Discharge Summary (Signed)
Patient ID: Krystal Craig MRN: [id number] DOB/AGE: [birth year] 81 y.o.  Admit date: 01/05/2015 Discharge date: 01/06/2015  Admission Diagnoses:  Active Problems:   Lumbar disc herniation   Discharge Diagnoses:  Active Problems:   Lumbar disc herniation  status post Procedure(s): L3-4 DECOMPRESSION MICRODISCECTOMY ON LEFT  Past Medical History  Diagnosis Date  . Allergic rhinitis   . History of bronchitis   . Pure hypercholesterolemia   . Hiatal hernia   . GERD (gastroesophageal reflux disease)   . Diverticulosis of colon   . IBS (irritable bowel syndrome)   . Urinary tract infection, site not specified   . Osteoarthritis   . Lumbar back pain   . Osteopenia   . Insomnia   . Complication of anesthesia   . PONV (postoperative nausea and vomiting)   . Chest pain 09/24/2011    normal stress test  . Ulcer   . DIVERTICULOSIS OF COLON 08/03/2008    Qualifier: Diagnosis of  By: Lenna Gilford MD, Deborra Medina   . H/O foot surgery   . Malignant neoplasm of bronchus and lung, unspecified site 2006    Surgeries: Procedure(s): L3-4 DECOMPRESSION MICRODISCECTOMY ON LEFT on 01/05/2015   Consultants:    Discharged Condition: Improved  Hospital Course: Krystal Craig is an 81 y.o. female who was admitted 01/05/2015 for operative treatment of lumbar disc herniation and stenosis. Patient failed conservative treatments (please see the history and physical for the specifics) and had severe unremitting pain that affects sleep, daily activities and work/hobbies. After pre-op clearance, the patient was taken to the operating room on 01/05/2015 and underwent  Procedure(s): L3-4 DECOMPRESSION MICRODISCECTOMY ON LEFT.    Patient was given perioperative antibiotics: Anti-infectives    Start     Dose/Rate Route Frequency Ordered Stop   01/05/15 2100  ceFAZolin (ANCEF) IVPB 1 g/50 mL premix     1 g 100 mL/hr over 30 Minutes Intravenous Every 8 hours 01/05/15 1727 01/06/15 0626       Patient was  given sequential compression devices and early ambulation to prevent DVT.   Patient benefited maximally from hospital stay and there were no complications. At the time of discharge, the patient was urinating/moving their bowels without difficulty, tolerating a regular diet, pain is controlled with oral pain medications and they have been cleared by PT/OT.   Recent vital signs: Patient Vitals for the past 24 hrs:  BP Temp Temp src Pulse Resp SpO2 Height Weight  01/06/15 0539 (!) 114/56 mmHg 98.4 F (36.9 C) Oral 69 18 98 % - -  01/06/15 0140 (!) 122/47 mmHg 98.3 F (36.8 C) Oral 62 18 99 % - -  01/05/15 2112 115/71 mmHg 99.3 F (37.4 C) Oral 64 18 99 % - -  01/05/15 1740 (!) 145/64 mmHg 98 F (36.7 C) Oral 61 18 100 % '5\' 3"'$  (1.6 m) 63.59 kg (140 lb 3.1 oz)  01/05/15 1713 - 97.8 F (36.6 C) - 61 14 99 % - -  01/05/15 1708 (!) 138/56 mmHg - - - - - - -  01/05/15 1653 (!) 147/57 mmHg - - - - - - -  01/05/15 1650 - - - 63 20 100 % - -  01/05/15 1642 - - - 69 14 100 % - -  01/05/15 1638 (!) 150/71 mmHg - - - - - - -  01/05/15 1628 - - - 72 20 100 % - -  01/05/15 1626 - 97 F (36.1 C) - - - - - -  01/05/15 1623 (!) 142/52 mmHg - - - - - - -  01/05/15 1036 (!) 164/73 mmHg 98 F (36.7 C) - 83 20 99 % - -     Recent laboratory studies:  Recent Labs  01/03/15 1249  WBC 6.0  HGB 13.3  HCT 39.5  PLT 281  NA 139  K 4.2  CL 102  CO2 27  BUN 14  CREATININE 1.03*  GLUCOSE 100*  CALCIUM 10.3     Discharge Medications:     Medication List    STOP taking these medications        acetaminophen 500 MG tablet  Commonly known as:  TYLENOL     aspirin 81 MG tablet     HYDROcodone-acetaminophen 5-325 MG per tablet  Commonly known as:  NORCO/VICODIN  Replaced by:  HYDROcodone-acetaminophen 10-325 MG per tablet     traMADol 50 MG tablet  Commonly known as:  ULTRAM      TAKE these medications        alum & mag hydroxide-simeth 200-200-20 MG/5ML suspension  Commonly known as:   MAALOX/MYLANTA  Take 30 mLs by mouth every 6 (six) hours as needed for indigestion or heartburn.     calcium carbonate 500 MG chewable tablet  Commonly known as:  TUMS - dosed in mg elemental calcium  Chew 3 tablets by mouth daily as needed for indigestion or heartburn.     Calcium-D 600-400 MG-UNIT Tabs  Take 1 tablet by mouth 2 (two) times daily.     CENTRUM SILVER PO  Take 1 tablet by mouth daily.     conjugated estrogens vaginal cream  Commonly known as:  PREMARIN  Use cream once weekly as needed     HYDROcodone-acetaminophen 10-325 MG per tablet  Commonly known as:  NORCO  Take 1 tablet by mouth every 6 (six) hours as needed.     methocarbamol 500 MG tablet  Commonly known as:  ROBAXIN  Take 1 tablet (500 mg total) by mouth 3 (three) times daily as needed for muscle spasms.     ondansetron 4 MG tablet  Commonly known as:  ZOFRAN  Take 1 tablet (4 mg total) by mouth every 8 (eight) hours as needed for nausea or vomiting.     polyethylene glycol packet  Commonly known as:  MIRALAX / GLYCOLAX  Take 17 g by mouth daily as needed for mild constipation or moderate constipation.     simvastatin 40 MG tablet  Commonly known as:  ZOCOR  Take 1 tablet (40 mg total) by mouth at bedtime.     zolpidem 10 MG tablet  Commonly known as:  AMBIEN  Take 1 tablet (10 mg total) by mouth at bedtime as needed for sleep.        Diagnostic Studies: Dg Lumbar Spine 2-3 Views  01/05/2015   CLINICAL DATA:  81 year old female with L3-L4 disc herniation undergoing lumbar spine surgery. Initial encounter.  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  Two intraoperative fluoroscopic cross-table lateral images of the lower lumbar spine. Both demonstrate surgical devices approaching the L3-L4 disc space.  FLUOROSCOPY TIME:  0 minutes 6 seconds  IMPRESSION:  Intraoperative fluoroscopic images demonstrating surgery at the L3-L4 level.   Electronically Signed   By: Genevie Ann M.D.   On: 01/05/2015 15:38   Dg C-arm 1-60 Min  01/05/2015   CLINICAL  DATA:  81 year old female with L3-L4 disc herniation undergoing lumbar spine surgery. Initial encounter.  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  Two intraoperative fluoroscopic cross-table lateral images of the lower lumbar spine. Both demonstrate surgical devices approaching the L3-L4 disc space.  FLUOROSCOPY TIME:  0 minutes 6 seconds  IMPRESSION: Intraoperative fluoroscopic images demonstrating surgery at the L3-L4 level.   Electronically Signed   By: Genevie Ann M.D.   On: 01/05/2015 15:38          Follow-up Information    Follow up with Dahlia Bailiff, MD In 2 weeks.   Specialty:  Orthopedic Surgery   Why:  For suture removal, For wound re-check   Contact information:   [address] [place] [address] [phone]       Discharge Plan:  discharge to home  Disposition: hospital course uneventful.  Leg pain improving, back pain managed on oral meds.  Plan ond/c to home after cleared by PT today.    Signed: Melina Schools D for Dr. Melina Schools [facility] [phone] 01/06/2015, 8:01 AM

## 2015-01-06 NOTE — Progress Notes (Signed)
Discharge orders received. Pt and son educated on discharge instructions and spinal precautions. Pt verbalized understanding. Pt given discharge packet and prescriptions. Rolling walker delivered to room. Pt and belongings taken downstairs by staff via wheelchair.

## 2015-01-06 NOTE — Clinical Social Work Note (Signed)
CSW Consult Acknowledged:   CSW received a consult for SNF placement. Per MD the pt will be discharge home. CSW will sign off.       [place], MSW, [place]

## 2015-01-06 NOTE — Evaluation (Signed)
Occupational Therapy Evaluation Patient Details Name: Krystal Craig MRN: [id number] DOB: Feb 08, 1933 Today's Date: 01/06/2015    History of Present Illness 81 y.o. female s/p L3-4 decompression microdisectomy on L   Clinical Impression   Patient is s/p L3-4 decompression surgery resulting in functional limitations due to the deficits listed below (see OT problem list). Education and handout provided on back precautions. Pt demonstrated ability to don brace and complete ADLs with supervision, maintaining precautions. Pt reported increasing groin and L knee pain while standing at sink; pain decreased upon sitting.  Discussed safety and fall prevention for home. Pt reports spouse is available to assist with ADLs upon d/c. No further OT needed at this time.      Follow Up Recommendations  No OT follow up;Supervision/Assistance - 24 hour    Equipment Recommendations  None recommended by OT    Recommendations for Other Services       Precautions / Restrictions Precautions Precautions: Back Precaution Booklet Issued: Yes (comment) Precaution Comments: handout provided Required Braces or Orthoses: Spinal Brace Spinal Brace: Lumbar corset;Applied in sitting position Restrictions Weight Bearing Restrictions: No      Mobility Bed Mobility Overal bed mobility: Needs Assistance Bed Mobility: Rolling;Sidelying to Sit Rolling: Supervision (VC for technique) Sidelying to sit: Min guard (VC for technique)          Transfers Overall transfer level: Needs assistance Equipment used: Rolling walker (2 wheeled) Transfers: Sit to/from Stand Sit to Stand: Supervision              Balance Overall balance assessment: Needs assistance Sitting-balance support: Feet supported;No upper extremity supported Sitting balance-Leahy Scale: Good     Standing balance support: No upper extremity supported;During functional activity Standing balance-Leahy Scale: Good                               ADL Overall ADL's : Needs assistance/impaired Eating/Feeding: Independent;Sitting   Grooming: Wash/dry hands;Wash/dry face;Oral care;Brushing hair;Supervision/safety;Sitting   Upper Body Bathing: Supervision/ safety;Sitting   Lower Body Bathing: Minimal assistance;Sit to/from stand   Upper Body Dressing : Supervision/safety;Sitting (don brace with VCs)   Lower Body Dressing: Min guard;Adhering to back precautions;Sit to/from stand   Toilet Transfer: Supervision/safety;Ambulation;Regular Toilet   Toileting- Clothing Manipulation and Hygiene: Modified independent;Sit to/from stand   Tub/ Shower Transfer: Walk-in shower;Min guard;3 in 1;Ambulation;Rolling walker   Functional mobility during ADLs: Min guard;Rolling walker General ADL Comments: Pt requesting to sit during ADL at sink due to increased groin and L knee pain when standing     Vision Vision Assessment?: No apparent visual deficits   Perception     Praxis      Pertinent Vitals/Pain Pain Assessment: 0-10 Pain Score: 7  Pain Location: back, groin, L knee Pain Descriptors / Indicators: Sore;Aching Pain Intervention(s): Limited activity within patient's tolerance;Monitored during session;Premedicated before session;Repositioned;Relaxation     Hand Dominance Right   Extremity/Trunk Assessment Upper Extremity Assessment Upper Extremity Assessment: Overall WFL for tasks assessed   Lower Extremity Assessment Lower Extremity Assessment: Defer to PT evaluation;LLE deficits/detail LLE Deficits / Details: Pt reports L knee pain       Communication Communication Communication: No difficulties   Cognition Arousal/Alertness: Awake/alert Behavior During Therapy: WFL for tasks assessed/performed Overall Cognitive Status: Within Functional Limits for tasks assessed                     General Comments  Exercises       Shoulder Instructions      Home Living Family/patient  expects to be discharged to:: Private residence Living Arrangements: Spouse/significant other Available Help at Discharge: Family;Available 24 hours/day;Friend(s) Type of Home: House Home Access: Stairs to enter [employer] of Steps: 2-6   Home Layout: One level     Bathroom Shower/Tub: Tub/shower unit;Walk-in shower Shower/tub characteristics: Architectural technologist: Standard     Home Equipment: Environmental consultant - 4 wheels;Bedside commode          Prior Functioning/Environment Level of Independence: Independent with assistive device(s)        Comments: used shower chair for bathing; spouse set-up for ADLs    OT Diagnosis: Generalized weakness   OT Problem List: Decreased strength;Decreased activity tolerance;Pain   OT Treatment/Interventions:      OT Goals(Current goals can be found in the care plan section) Acute Rehab OT Goals Patient Stated Goal: to go home today OT Goal Formulation: With patient Time For Goal Achievement: 01/20/15 Potential to Achieve Goals: Good  OT Frequency:     Barriers to D/C:            Co-evaluation              End of Session Equipment Utilized During Treatment: Gait belt;Rolling walker;Back brace Nurse Communication: Mobility status;Precautions  Activity Tolerance: Patient tolerated treatment well Patient left: in chair;with call bell/phone within reach;with nursing/sitter in room   Time: 0819-0850 OT Time Calculation (min): 31 min Charges:  OT General Charges $OT Visit: 1 Procedure OT Evaluation $Initial OT Evaluation Tier I: 1 Procedure OT Treatments $Self Care/Home Management : 8-22 mins G-Codes:    Forest Gleason 01/06/2015, 10:23 AM

## 2015-01-28 ENCOUNTER — Ambulatory Visit (INDEPENDENT_AMBULATORY_CARE_PROVIDER_SITE_OTHER): Payer: Medicare Other

## 2015-01-28 ENCOUNTER — Ambulatory Visit (INDEPENDENT_AMBULATORY_CARE_PROVIDER_SITE_OTHER): Payer: Medicare Other | Admitting: Emergency Medicine

## 2015-01-28 VITALS — BP 136/70 | HR 76 | Temp 98.5°F | Ht 64.0 in | Wt 139.0 lb

## 2015-01-28 DIAGNOSIS — R110 Nausea: Secondary | ICD-10-CM

## 2015-01-28 DIAGNOSIS — K5900 Constipation, unspecified: Secondary | ICD-10-CM

## 2015-01-28 LAB — POCT CBC
GRANULOCYTE PERCENT: 82.6 % — AB (ref 37–80)
HCT, POC: 36.9 % — AB (ref 37.7–47.9)
HEMOGLOBIN: 11.9 g/dL — AB (ref 12.2–16.2)
LYMPH, POC: 0.9 (ref 0.6–3.4)
MCH: 28.5 pg (ref 27–31.2)
MCHC: 32.2 g/dL (ref 31.8–35.4)
MCV: 88.3 fL (ref 80–97)
MID (CBC): 0.3 (ref 0–0.9)
MPV: 7.2 fL (ref 0–99.8)
PLATELET COUNT, POC: 350 10*3/uL (ref 142–424)
POC Granulocyte: 5.7 (ref 2–6.9)
POC LYMPH %: 12.8 % (ref 10–50)
POC MID %: 4.6 % (ref 0–12)
RBC: 4.18 M/uL (ref 4.04–5.48)
RDW, POC: 14.5 %
WBC: 6.9 10*3/uL (ref 4.6–10.2)

## 2015-01-28 LAB — POCT UA - MICROSCOPIC ONLY
CASTS, UR, LPF, POC: NEGATIVE
CRYSTALS, UR, HPF, POC: NEGATIVE
MUCUS UA: NEGATIVE
Yeast, UA: NEGATIVE

## 2015-01-28 LAB — POCT URINALYSIS DIPSTICK
BILIRUBIN UA: NEGATIVE
Glucose, UA: NEGATIVE
Ketones, UA: NEGATIVE
Nitrite, UA: NEGATIVE
PH UA: 7
PROTEIN UA: NEGATIVE
Spec Grav, UA: 1.015
UROBILINOGEN UA: 0.2

## 2015-01-28 MED ORDER — PROMETHAZINE HCL 25 MG/ML IJ SOLN
25.0000 mg | Freq: Once | INTRAMUSCULAR | Status: AC
  Administered 2015-01-28: 25 mg via INTRAMUSCULAR

## 2015-01-28 MED ORDER — PROMETHAZINE HCL 25 MG PO TABS: 25.0000 mg | ORAL_TABLET | Freq: Four times a day (QID) | ORAL | Status: DC | PRN

## 2015-01-28 NOTE — Patient Instructions (Signed)

## 2015-01-28 NOTE — Progress Notes (Signed)
Subjective:  Patient ID: Krystal Craig, female    DOB: Jul 06, 1932  Age: 81 y.o. MRN: [id number]  CC: Nausea   HPI Krystal Craig presents  patients been taking narcotics for several months for low back pain with herniated disc and sciatic neuritis. Then she underwent surgery for her back and had discectomy and laminectomy 3 weeks ago. She's been taking pain pills for the last 3 weeks. She thought she was having a urinary tract infection and started taking Septra DS 3 days ago yesterday she developed profound nausea and was prescribed Zofran by her surgeon. She's had no dysuria urgency or frequency no fever chills none vomiting. She said multiple small stools daily. Is no abdominal pain or bloating. She has no appetite. She has no improvement or symptoms with over-the-counter medication  History Krystal Craig has a past medical history of Allergic rhinitis; History of bronchitis; Pure hypercholesterolemia; Hiatal hernia; GERD (gastroesophageal reflux disease); Diverticulosis of colon; IBS (irritable bowel syndrome); Urinary tract infection, site not specified; Osteoarthritis; Lumbar back pain; Osteopenia; Insomnia; Complication of anesthesia; PONV (postoperative nausea and vomiting); Chest pain (09/24/2011); Ulcer; DIVERTICULOSIS OF COLON (08/03/2008); H/O foot surgery; and Malignant neoplasm of bronchus and lung, unspecified site (2006).   She has past surgical history that includes appendectomy (1969); Abdominal hysterectomy (1969); Cholecystectomy (1976); bilateral shouler operations for impingement syndromes (2001); left lower lobectomy (11/2004); urologic sugery with cystocele/rectocele repairs and sling (03/2007); bilateral cataract surgery (2012); turmor ear (Right, 09/2013); Appendectomy; Laparoscopic lysis intestinal adhesions; and Lumbar laminectomy/decompression microdiscectomy (Left, 01/05/2015).   Her  family history includes Breast cancer (age of onset: 59) in her sister; CVA in her father;  Dementia in her mother; Heart attack in her mother; Stroke in her father. There is no history of Colon cancer, Esophageal cancer, Pancreatic cancer, Rectal cancer, or Stomach cancer.  She   reports that she quit smoking about 30 years ago. Her smoking use included Cigarettes. She has a 30 pack-year smoking history. She has never used smokeless tobacco. She reports that she does not drink alcohol or use illicit drugs.  Outpatient Prescriptions Prior to Visit  Medication Sig Dispense Refill  . alum & mag hydroxide-simeth (MAALOX/MYLANTA) 200-200-20 MG/5ML suspension Take 30 mLs by mouth every 6 (six) hours as needed for indigestion or heartburn.    . calcium carbonate (TUMS - DOSED IN MG ELEMENTAL CALCIUM) 500 MG chewable tablet Chew 3 tablets by mouth daily as needed for indigestion or heartburn.    . Calcium Carbonate-Vitamin D (CALCIUM-D) 600-400 MG-UNIT TABS Take 1 tablet by mouth 2 (two) times daily.    Marland Kitchen conjugated estrogens (PREMARIN) vaginal cream Use cream once weekly as needed (Patient taking differently: Place 1 Applicatorful vaginally as needed. Use cream once weekly as needed) 42.5 g 1  . Multiple Vitamins-Minerals (CENTRUM SILVER PO) Take 1 tablet by mouth daily.    . ondansetron (ZOFRAN) 4 MG tablet Take 1 tablet (4 mg total) by mouth every 8 (eight) hours as needed for nausea or vomiting. 20 tablet 0  . polyethylene glycol (MIRALAX / GLYCOLAX) packet Take 17 g by mouth daily as needed for mild constipation or moderate constipation.     . simvastatin (ZOCOR) 40 MG tablet Take 1 tablet (40 mg total) by mouth at bedtime. 90 tablet 2  . zolpidem (AMBIEN) 10 MG tablet Take 1 tablet (10 mg total) by mouth at bedtime as needed for sleep. 90 tablet 1  . HYDROcodone-acetaminophen (NORCO) 10-325 MG per tablet Take 1 tablet by mouth every  6 (six) hours as needed. 60 tablet 0  . methocarbamol (ROBAXIN) 500 MG tablet Take 1 tablet (500 mg total) by mouth 3 (three) times daily as needed for muscle  spasms. 60 tablet 0   No facility-administered medications prior to visit.    Social History   Social History  . Marital Status: Married    Spouse Name: N/A  . Number of Children: N/A  . Years of Education: N/A   Social History Main Topics  . Smoking status: Former Smoker -- 1.00 packs/day for 30 years    Types: Cigarettes    Quit date: 06/17/1984  . Smokeless tobacco: Never Used  . Alcohol Use: No  . Drug Use: No  . Sexual Activity: Not Currently    Birth Control/ Protection: Post-menopausal     Comment: 1st intercourse 81 yo-2 partners   Other Topics Concern  . Not on file   Social History Narrative   Married (2nd marriage-1st husband died at age 53) married 17 29 years in 05/2014. 2 sons from first marriage, 3 from 2nd (step). 15 grandchildren.       Retired in 2008 from [employer]: reading, golf, family time     Review of Systems  Constitutional: Negative for fever, chills and appetite change.  HENT: Negative for congestion, ear pain, postnasal drip, sinus pressure and sore throat.   Eyes: Negative for pain and redness.  Respiratory: Negative for cough, shortness of breath and wheezing.   Cardiovascular: Negative for leg swelling.  Gastrointestinal: Negative for nausea, vomiting, abdominal pain, diarrhea, constipation and blood in stool.  Endocrine: Negative for polyuria.  Genitourinary: Negative for dysuria, urgency, frequency and flank pain.  Musculoskeletal: Negative for gait problem.  Skin: Negative for rash.  Neurological: Negative for weakness and headaches.  Psychiatric/Behavioral: Negative for confusion and decreased concentration. The patient is not nervous/anxious.     Objective:  BP 136/70 mmHg  Pulse 76  Temp(Src) 98.5 F (36.9 C) (Oral)  Ht '5\' 4"'$  (1.626 m)  Wt 139 lb (63.05 kg)  BMI 23.85 kg/m2  SpO2 95%  Physical Exam  Constitutional: She is oriented to person, place, and time. She appears well-developed and  well-nourished. No distress.  HENT:  Head: Normocephalic and atraumatic.  Right Ear: External ear normal.  Left Ear: External ear normal.  Nose: Nose normal.  Eyes: Conjunctivae and EOM are normal. Pupils are equal, round, and reactive to light. No scleral icterus.  Neck: Normal range of motion. Neck supple. No tracheal deviation present.  Cardiovascular: Normal rate, regular rhythm and normal heart sounds.   Pulmonary/Chest: Effort normal. No respiratory distress. She has no wheezes. She has no rales.  Abdominal: She exhibits no mass. There is no tenderness. There is no rebound and no guarding.  Musculoskeletal: She exhibits no edema.  Lymphadenopathy:    She has no cervical adenopathy.  Neurological: She is alert and oriented to person, place, and time. Coordination normal.  Skin: Skin is warm and dry. No rash noted.  Psychiatric: She has a normal mood and affect. Her behavior is normal.   her abdomen was really quite soft and nontender there is no visceromegaly or masses. Sounds were active and normal    Assessment & Plan:   Lalani was seen today for nausea.  Diagnoses and all orders for this visit:  Nausea without vomiting -     POCT CBC -     DG Abd Acute W/Chest -     POCT  urinalysis dipstick -     POCT UA - Microscopic Only -     promethazine (PHENERGAN) injection 25 mg; Inject 1 mL (25 mg total) into the muscle once.  Constipation, unspecified constipation type  Other orders -     promethazine (PHENERGAN) 25 MG tablet; Take 1 tablet (25 mg total) by mouth every 6 (six) hours as needed for nausea or vomiting.   I have discontinued Ms. Muecke's HYDROcodone-acetaminophen and methocarbamol. I am also having her start on promethazine. Additionally, I am having her maintain her Multiple Vitamins-Minerals (CENTRUM SILVER PO), Calcium-D, polyethylene glycol, simvastatin, conjugated estrogens, zolpidem, calcium carbonate, alum & mag hydroxide-simeth, ondansetron, and  sulfamethoxazole-trimethoprim. We administered promethazine.  Meds ordered this encounter  Medications  . sulfamethoxazole-trimethoprim (BACTRIM DS,SEPTRA DS) 800-160 MG per tablet    Sig: Take 1 tablet by mouth 2 (two) times daily.  . promethazine (PHENERGAN) 25 MG tablet    Sig: Take 1 tablet (25 mg total) by mouth every 6 (six) hours as needed for nausea or vomiting.    Dispense:  30 tablet    Refill:  0  . promethazine (PHENERGAN) injection 25 mg    Sig:    I suggested to her that her basilar x-ray she's probably little constipated she should try a fleets enema today and changed her from Zofran which he attributes her nausea to be worsening. And suggested she try try some Phenergan. I warned her about sedation with Phenergan.   Appropriate red flag conditions were discussed with the patient as well as actions that should be taken.  Patient expressed his understanding.  Follow-up: Return if symptoms worsen or fail to improve.  Roselee Culver, MD   Results for orders placed or performed in visit on 01/28/15  POCT CBC  Result Value Ref Range   WBC 6.9 4.6 - 10.2 K/uL   Lymph, poc 0.9 0.6 - 3.4   POC LYMPH PERCENT 12.8 10 - 50 %L   MID (cbc) 0.3 0 - 0.9   POC MID % 4.6 0 - 12 %M   POC Granulocyte 5.7 2 - 6.9   Granulocyte percent 82.6 (A) 37 - 80 %G   RBC 4.18 4.04 - 5.48 M/uL   Hemoglobin 11.9 (A) 12.2 - 16.2 g/dL   HCT, POC 36.9 (A) 37.7 - 47.9 %   MCV 88.3 80 - 97 fL   MCH, POC 28.5 27 - 31.2 pg   MCHC 32.2 31.8 - 35.4 g/dL   RDW, POC 14.5 %   Platelet Count, POC 350 142 - 424 K/uL   MPV 7.2 0 - 99.8 fL  POCT urinalysis dipstick  Result Value Ref Range   Color, UA yellow    Clarity, UA clear    Glucose, UA neg    Bilirubin, UA neg    Ketones, UA neg    Spec Grav, UA 1.015    Blood, UA mod    pH, UA 7.0    Protein, UA neg    Urobilinogen, UA 0.2    Nitrite, UA neg    Leukocytes, UA Trace (A) Negative  POCT UA - Microscopic Only  Result Value Ref Range     WBC, Ur, HPF, POC 4-8    RBC, urine, microscopic 5-10    Bacteria, U Microscopic trace    Mucus, UA neg    Epithelial cells, urine per micros 2-4    Crystals, Ur, HPF, POC neg    Casts, Ur, LPF, POC neg    Yeast, UA  neg    [facility] reading (PRIMARY) by  Dr. Ouida Sills increased stool burden otherwise no obstruction or free air.

## 2015-01-30 ENCOUNTER — Ambulatory Visit (INDEPENDENT_AMBULATORY_CARE_PROVIDER_SITE_OTHER): Payer: Medicare Other | Admitting: Family

## 2015-01-30 ENCOUNTER — Other Ambulatory Visit: Payer: Self-pay | Admitting: *Deleted

## 2015-01-30 ENCOUNTER — Encounter: Payer: Self-pay | Admitting: Family

## 2015-01-30 ENCOUNTER — Telehealth: Payer: Self-pay | Admitting: Family Medicine

## 2015-01-30 VITALS — BP 120/84 | HR 78 | Temp 98.5°F | Wt 139.0 lb

## 2015-01-30 DIAGNOSIS — R112 Nausea with vomiting, unspecified: Secondary | ICD-10-CM

## 2015-01-30 DIAGNOSIS — A084 Viral intestinal infection, unspecified: Secondary | ICD-10-CM

## 2015-01-30 DIAGNOSIS — K219 Gastro-esophageal reflux disease without esophagitis: Secondary | ICD-10-CM | POA: Diagnosis not present

## 2015-01-30 LAB — BASIC METABOLIC PANEL
BUN: 8 mg/dL (ref 6–23)
CALCIUM: 9.7 mg/dL (ref 8.4–10.5)
CO2: 27 meq/L (ref 19–32)
Chloride: 101 mEq/L (ref 96–112)
Creatinine, Ser: 1.03 mg/dL (ref 0.40–1.20)
GFR: 54.83 mL/min — ABNORMAL LOW (ref >60.00)
GLUCOSE: 108 mg/dL — AB (ref 70–99)
Potassium: 4.4 mEq/L (ref 3.5–5.1)
SODIUM: 136 meq/L (ref 135–145)

## 2015-01-30 LAB — CBC WITH DIFFERENTIAL/PLATELET
Basophils Absolute: 0 10*3/uL (ref 0.0–0.1)
Basophils Relative: 0.4 % (ref 0.0–3.0)
Eosinophils Absolute: 0.1 10*3/uL (ref 0.0–0.7)
Eosinophils Relative: 1 % (ref 0.0–5.0)
HEMATOCRIT: 38 % (ref 36.0–46.0)
HEMOGLOBIN: 12.4 g/dL (ref 12.0–15.0)
LYMPHS PCT: 11.7 % — AB (ref 12.0–46.0)
Lymphs Abs: 1 10*3/uL (ref 0.7–4.0)
MCHC: 32.6 g/dL (ref 30.0–36.0)
MCV: 89.4 fl (ref 78.0–100.0)
MONO ABS: 0.7 10*3/uL (ref 0.1–1.0)
Monocytes Relative: 7.9 % (ref 3.0–12.0)
Neutro Abs: 6.7 10*3/uL (ref 1.4–7.7)
Neutrophils Relative %: 79 % — ABNORMAL HIGH (ref 43.0–77.0)
Platelets: 339 10*3/uL (ref 150.0–400.0)
RBC: 4.25 Mil/uL (ref 3.87–5.11)
RDW: 14.1 % (ref 11.5–15.5)
WBC: 8.5 10*3/uL (ref 4.0–10.5)

## 2015-01-30 LAB — H. PYLORI ANTIBODY, IGG: H PYLORI IGG: POSITIVE — AB

## 2015-01-30 MED ORDER — OMEPRAZOLE 20 MG PO CPDR: 20.0000 mg | DELAYED_RELEASE_CAPSULE | Freq: Every day | ORAL | Status: DC

## 2015-01-30 MED ORDER — PROMETHAZINE HCL 25 MG/ML IJ SOLN
12.5000 mg | Freq: Four times a day (QID) | INTRAMUSCULAR | Status: DC | PRN
  Administered 2015-01-30: 12.5 mg via INTRAMUSCULAR

## 2015-01-30 MED ORDER — PROMETHAZINE HCL 25 MG/ML IJ SOLN: 12.5000 mg | Freq: Once | INTRAMUSCULAR | Status: DC

## 2015-01-30 NOTE — Telephone Encounter (Signed)
Patient called and made aware.

## 2015-01-30 NOTE — Progress Notes (Signed)
Pre visit review using our clinic review tool, if applicable. No additional management support is needed unless otherwise documented below in the visit note. 

## 2015-01-30 NOTE — Telephone Encounter (Signed)
Pt call to say that [facility] and [facility] did not have the following rx  omeprazole (PRILOSEC) 20 MG capsule   [facility] [address]

## 2015-01-30 NOTE — Telephone Encounter (Signed)
Rx sent in

## 2015-01-30 NOTE — Telephone Encounter (Signed)
Patient Name: ENNIS DELPOZO DOB: 26-Aug-1932 Initial Comment caller states she is very nauseated - has been this way since Friday- also has diarrhea Nurse Assessment Nurse: Ronnald Ramp, RN, Miranda Date/Time (Eastern Time): 01/30/2015 8:31:45 AM Confirm and document reason for call. If symptomatic, describe symptoms. ---Caller states she has had nausea since Friday and seen in UC on Saturday. She was diagnosed with possible constipation and told to take an enema. Then started having diarrhea Saturday evening. Has the patient traveled out of the country within the last 30 days? ---No Does the patient require triage? ---Yes Related visit to physician within the last 2 weeks? ---Yes Does the PT have any chronic conditions? (i.e. diabetes, asthma, etc.) ---Yes List chronic conditions. ---Back surgery, High Cholesterol Guidelines Guideline Title Affirmed Question Affirmed Notes Nausea Unexplained nausea (all triage questions negative) Diarrhea [1] Age > 60 years AND [2] > 6 diarrhea stools in past 24 hours Final Disposition User See Physician within 4 Hours (or PCP triage) Ronnald Ramp, RN, Miranda Comments no appts today with Dr. Yong Channel, Appt scheduled for 10am with Dr. Justin Mend. Disagree/Comply: Comply Disagree/Comply: Comply

## 2015-01-30 NOTE — Progress Notes (Signed)
Subjective:    Patient ID: Krystal Craig, female    DOB: Jan 23, 1933, 81 y.o.   MRN: [id number]  HPI 81 year old white female, nonsmoker with a history of recurrent chest infection in today after being seen at the urgent care clinic on Saturday. She presented with nausea vomiting and diarrhea. Reports being diagnosed with having constipation. Patient denies any constipation. She also had a chest x-ray that was normal. Continues to be concerned because despite taking Phenergan and Zofran she's having a difficult time controlling the nausea. Does report that Phenergan injection worked well. Reports heartburn and indigestion has been taken an over-the-counter acid reducers that has not helped much. Has been trying to eat a bland diet but typically defecates after meals.   Review of Systems  Constitutional: Positive for chills and fatigue. Negative for fever.  HENT: Negative.   Respiratory: Negative.   Cardiovascular: Negative.   Gastrointestinal: Positive for nausea, vomiting and diarrhea. Negative for abdominal pain and constipation.  Endocrine: Negative.   Genitourinary: Negative.   Musculoskeletal: Negative.   Skin: Negative.   Allergic/Immunologic: Negative.   Neurological: Negative.   Psychiatric/Behavioral: Negative.    Past Medical History  Diagnosis Date  . Allergic rhinitis   . History of bronchitis   . Pure hypercholesterolemia   . Hiatal hernia   . GERD (gastroesophageal reflux disease)   . Diverticulosis of colon   . IBS (irritable bowel syndrome)   . Urinary tract infection, site not specified   . Osteoarthritis   . Lumbar back pain   . Osteopenia   . Insomnia   . Complication of anesthesia   . PONV (postoperative nausea and vomiting)   . Chest pain 09/24/2011    normal stress test  . Ulcer   . DIVERTICULOSIS OF COLON 08/03/2008    Qualifier: Diagnosis of  By: Lenna Gilford MD, Deborra Medina   . H/O foot surgery   . Malignant neoplasm of bronchus and lung, unspecified site  2006    Social History   Social History  . Marital Status: Married    Spouse Name: N/A  . Number of Children: N/A  . Years of Education: N/A   Occupational History  . Not on file.   Social History Main Topics  . Smoking status: Former Smoker -- 1.00 packs/day for 30 years    Types: Cigarettes    Quit date: 06/17/1984  . Smokeless tobacco: Never Used  . Alcohol Use: No  . Drug Use: No  . Sexual Activity: Not Currently    Birth Control/ Protection: Post-menopausal     Comment: 1st intercourse 34 yo-2 partners   Other Topics Concern  . Not on file   Social History Narrative   Married (2nd marriage-1st husband died at age 21) married 76 29 years in 05/2014. 2 sons from first marriage, 3 from 2nd (step). 15 grandchildren.       Retired in 2008 from [employer] ([employer]      Hobbies: reading, golf, family time    Past Surgical History  Procedure Laterality Date  . Appendectomy  1969  . Abdominal hysterectomy  1969  . Cholecystectomy  1976  . Bilateral shouler operations for impingement syndromes  2001    Dr. Eddie Dibbles  . Left lower lobectomy  11/2004    via VATS and mini-thoractomy for 1.5cm adenoca of lung by Dr. Arlyce Dice  . Urologic sugery with cystocele/rectocele repairs and sling  03/2007    Dr. Terance Hart  . Bilateral cataract surgery  2012  Dr. Talbert Forest  . Turmor ear Right 09/2013  . Appendectomy    . Laparoscopic lysis intestinal adhesions    . Lumbar laminectomy/decompression microdiscectomy Left 01/05/2015    Procedure: L3-4 DECOMPRESSION MICRODISCECTOMY ON LEFT;  Surgeon: Melina Schools, MD;  Location: [facility];  Service: Orthopedics;  Laterality: Left;  left Lumbar 3-4    Family History  Problem Relation Age of Onset  . Colon cancer Neg Hx   . Esophageal cancer Neg Hx   . Pancreatic cancer Neg Hx   . Rectal cancer Neg Hx   . Stomach cancer Neg Hx   . Breast cancer Sister 64  . Dementia Mother   . Heart attack Mother   . CVA Father   . Stroke Father     Allergies    Allergen Reactions  . Codeine     Chest pain/tightness    Current Outpatient Prescriptions on File Prior to Visit  Medication Sig Dispense Refill  . alum & mag hydroxide-simeth (MAALOX/MYLANTA) 200-200-20 MG/5ML suspension Take 30 mLs by mouth every 6 (six) hours as needed for indigestion or heartburn.    . calcium carbonate (TUMS - DOSED IN MG ELEMENTAL CALCIUM) 500 MG chewable tablet Chew 3 tablets by mouth daily as needed for indigestion or heartburn.    . Calcium Carbonate-Vitamin D (CALCIUM-D) 600-400 MG-UNIT TABS Take 1 tablet by mouth 2 (two) times daily.    Marland Kitchen conjugated estrogens (PREMARIN) vaginal cream Use cream once weekly as needed (Patient taking differently: Place 1 Applicatorful vaginally as needed. Use cream once weekly as needed) 42.5 g 1  . Multiple Vitamins-Minerals (CENTRUM SILVER PO) Take 1 tablet by mouth daily.    . ondansetron (ZOFRAN) 4 MG tablet Take 1 tablet (4 mg total) by mouth every 8 (eight) hours as needed for nausea or vomiting. 20 tablet 0  . polyethylene glycol (MIRALAX / GLYCOLAX) packet Take 17 g by mouth daily as needed for mild constipation or moderate constipation.     . promethazine (PHENERGAN) 25 MG tablet Take 1 tablet (25 mg total) by mouth every 6 (six) hours as needed for nausea or vomiting. 30 tablet 0  . simvastatin (ZOCOR) 40 MG tablet Take 1 tablet (40 mg total) by mouth at bedtime. 90 tablet 2  . zolpidem (AMBIEN) 10 MG tablet Take 1 tablet (10 mg total) by mouth at bedtime as needed for sleep. 90 tablet 1  . sulfamethoxazole-trimethoprim (BACTRIM DS,SEPTRA DS) 800-160 MG per tablet Take 1 tablet by mouth 2 (two) times daily.     No current facility-administered medications on file prior to visit.    BP 120/84 mmHg  Pulse 78  Temp(Src) 98.5 F (36.9 C) (Oral)  Wt 139 lb (63.05 kg)chart     Objective:   Physical Exam  Constitutional: She is oriented to person, place, and time. She appears well-developed and well-nourished.  HENT:   Right Ear: External ear normal.  Left Ear: External ear normal.  Nose: Nose normal.  Mouth/Throat: Oropharynx is clear and moist.  Neck: Normal range of motion. Neck supple.  Cardiovascular: Normal rate, regular rhythm and normal heart sounds.   Pulmonary/Chest: Effort normal and breath sounds normal.  Abdominal: Soft. Bowel sounds are normal.  Musculoskeletal: Normal range of motion.  Neurological: She is alert and oriented to person, place, and time.  Skin: Skin is warm and dry.  Psychiatric: She has a normal mood and affect.          Assessment & Plan:  Cyniah was seen today for  nausea.  Diagnoses and all orders for this visit:  Non-intractable vomiting with nausea, vomiting of unspecified type -     H. pylori Antibody, IgG -     CBC with Differential -     Basic Metabolic Panel -     Culture, Urine -     promethazine (PHENERGAN) injection 12.5 mg; Inject 0.5 mLs (12.5 mg total) into the vein once.  Gastroesophageal reflux disease without esophagitis -     H. pylori Antibody, IgG -     CBC with Differential -     Basic Metabolic Panel  Viral gastroenteritis -     H. pylori Antibody, IgG -     CBC with Differential -     Basic Metabolic Panel -     promethazine (PHENERGAN) injection 12.5 mg; Inject 0.5 mLs (12.5 mg total) into the vein once.  Other orders -     omeprazole (PRILOSEC) 20 MG capsule; Take 1 capsule (20 mg total) by mouth daily.   We'll start omeprazole 20 milligrams once daily to help with heartburn and indigestion. Suggest a brats diet advance as able to tolerate. labs and urine culture sent. Call with any questions or concerns.

## 2015-01-30 NOTE — Addendum Note (Signed)
Addended by: Westley Hummer B on: 01/30/2015 11:24 AM   Modules accepted: Orders

## 2015-01-30 NOTE — Patient Instructions (Signed)

## 2015-02-01 ENCOUNTER — Other Ambulatory Visit: Payer: Self-pay | Admitting: Family

## 2015-02-01 ENCOUNTER — Telehealth: Payer: Self-pay | Admitting: Family Medicine

## 2015-02-01 ENCOUNTER — Encounter: Payer: Self-pay | Admitting: Family

## 2015-02-01 DIAGNOSIS — A048 Other specified bacterial intestinal infections: Secondary | ICD-10-CM | POA: Insufficient documentation

## 2015-02-01 LAB — URINE CULTURE
Colony Count: NO GROWTH
Organism ID, Bacteria: NO GROWTH

## 2015-02-01 MED ORDER — AMOXICILLIN 500 MG PO TABS: 1000.0000 mg | ORAL_TABLET | Freq: Two times a day (BID) | ORAL | Status: AC

## 2015-02-01 MED ORDER — OMEPRAZOLE 20 MG PO CPDR: 20.0000 mg | DELAYED_RELEASE_CAPSULE | Freq: Two times a day (BID) | ORAL | Status: DC

## 2015-02-01 MED ORDER — AMOXICILL-CLARITHRO-LANSOPRAZ PO MISC: Freq: Two times a day (BID) | ORAL | Status: DC

## 2015-02-01 MED ORDER — CLARITHROMYCIN 500 MG PO TABS: 500.0000 mg | ORAL_TABLET | Freq: Two times a day (BID) | ORAL | Status: DC

## 2015-02-01 NOTE — Telephone Encounter (Signed)
Really hard to say without personally evaluating her. It sounds like could be GI bug. I could see her if she likes but GI bug seems most likely.   Also related to indigestion, H. Pylori was found and patient was sent in antibiotics. This is going to complicate things slightly as diarrhea can be caused by antibiotics but I would have her take that medicine and try to get into see me next week when I return. She needs to see [name] sooner for worsening symptoms.

## 2015-02-01 NOTE — Telephone Encounter (Signed)
Pt was seen on 8-15 by Padonda for nausea. Pt would like keba to return her call. Pt is still having nausea. Please advise

## 2015-02-01 NOTE — Telephone Encounter (Signed)
Pt states she is still nauseated and she is taking Phenegran '25mg'$ , she is doing the [employer] and c/o diarrhea and denies vomitting since last Friday. She states she dosent feel the need to come back in if this is indeed a stomach bug, she just wants your opinion she saw Padonda two days ago.

## 2015-02-01 NOTE — Telephone Encounter (Signed)
Sorry Bevelyn Ngo- see separate phone note from [place] under lab results. Padonda sent in antibiotic treatment for her.   Apolonio Schneiders- can you help out since Bevelyn Ngo is gone? Thanks

## 2015-02-01 NOTE — Telephone Encounter (Signed)
Pt states they did not give her the bactrim at Urgent Care all they gave her was phenegran and she will need refills on that ok to send in Bactrim and refill phenegran?

## 2015-02-01 NOTE — Telephone Encounter (Signed)
Error

## 2015-02-01 NOTE — Telephone Encounter (Signed)
Patient is aware 

## 2015-02-04 ENCOUNTER — Other Ambulatory Visit: Payer: Self-pay | Admitting: Emergency Medicine

## 2015-02-05 ENCOUNTER — Other Ambulatory Visit: Payer: Self-pay | Admitting: Emergency Medicine

## 2015-02-06 ENCOUNTER — Telehealth: Payer: Self-pay | Admitting: Family Medicine

## 2015-02-06 NOTE — Telephone Encounter (Signed)
Pt request refill promethazine (PHENERGAN) 25 MG tablet.  There is also two request for Dr Ouida Sills to refill this med from this past weekend.  Pt got 30 tabs on 8/13 and states she is taking around the clock for nausea  [facility]

## 2015-02-06 NOTE — Telephone Encounter (Signed)
Ok to fill 

## 2015-02-07 MED ORDER — PROMETHAZINE HCL 25 MG PO TABS: 25.0000 mg | ORAL_TABLET | Freq: Four times a day (QID) | ORAL | Status: DC | PRN

## 2015-02-07 NOTE — Addendum Note (Signed)
Addended by: Roselee Culver on: 02/07/2015 05:22 PM   Modules accepted: Orders

## 2015-02-07 NOTE — Telephone Encounter (Signed)
May fill. If issues persist, patient is going to need to follow up

## 2015-02-07 NOTE — Telephone Encounter (Signed)
Spoke with patient and she no longer needs the phenergan but she would like to know if she should be rechecked after finishing her antibiotics?

## 2015-02-08 ENCOUNTER — Encounter: Payer: Self-pay | Admitting: Adult Health

## 2015-02-08 ENCOUNTER — Ambulatory Visit (INDEPENDENT_AMBULATORY_CARE_PROVIDER_SITE_OTHER): Payer: Medicare Other | Admitting: Adult Health

## 2015-02-08 VITALS — BP 122/80 | Temp 98.7°F | Ht 64.0 in | Wt 139.9 lb

## 2015-02-08 DIAGNOSIS — R110 Nausea: Secondary | ICD-10-CM | POA: Diagnosis not present

## 2015-02-08 DIAGNOSIS — R197 Diarrhea, unspecified: Secondary | ICD-10-CM | POA: Diagnosis not present

## 2015-02-08 MED ORDER — PANTOPRAZOLE SODIUM 40 MG PO TBEC: 40.0000 mg | DELAYED_RELEASE_TABLET | Freq: Every day | ORAL | Status: DC

## 2015-02-08 NOTE — Progress Notes (Addendum)
   Subjective:    Patient ID: Krystal Craig, female    DOB: May 22, 1933, 81 y.o.   MRN: [id number]  HPI   This patient was seen during downtime, at the time of exam records were not available  81 year old female who presents to the office today with her husband for nausea and diarrhea 2 weeks. She was diagnosed one week ago Monday on 01/30/2015 with H. pylori, and was started on amoxicillin 500 mg as well as clarithromycin 500 mg both twice a day, as well as omeprazole BID. She has been using Phenergan to help with her nausea, she has recently run out of this medication and a refill was called in yesterday by Dr. Yong Channel, She has yet to pick up this refill. SHe endorses that the Phenergan does not work very well for her nausea, she has also tried Zofran in the past which did not help either. SHe endorses eating a brat diet.  She states that she is unable to take Protonix due to the possibly making her nauseous. She is unsure if she's taken this medication in the past but "it sounds familiar".  She continues to have occasional bouts of diarrhea.  Review of Systems  Constitutional: Positive for activity change and fatigue. Negative for fever and chills.  Respiratory: Negative.   Cardiovascular: Negative.   Gastrointestinal: Positive for nausea and diarrhea. Negative for vomiting, abdominal pain, constipation, blood in stool and abdominal distention.  Genitourinary: Negative.   Skin: Negative.   Neurological: Negative.   All other systems reviewed and are negative.      Objective:   Physical Exam  Constitutional: She appears well-developed and well-nourished. No distress.  Cardiovascular: Normal rate, regular rhythm, normal heart sounds and intact distal pulses.  Exam reveals no gallop and no friction rub.   No murmur heard. Pulmonary/Chest: Effort normal and breath sounds normal. No respiratory distress. She has no wheezes. She has no rales. She exhibits no tenderness.  Abdominal:  Soft. Bowel sounds are normal. She exhibits no distension and no mass. There is no tenderness. There is no rebound and no guarding.  Musculoskeletal: Normal range of motion.  Lymphadenopathy:    She has no cervical adenopathy.  Neurological: She is alert.  Skin: Skin is warm and dry. No rash noted. She is not diaphoretic. No erythema. No pallor.  Psychiatric: She has a normal mood and affect. Her behavior is normal. Judgment and thought content normal.  Nursing note and vitals reviewed.      Assessment & Plan:  1. Nausea without vomiting - Likely due to ABX therapy and h.pylori infection.  - Prescription given to patient for Reglan 10 mg to be taken 4 times a day as needed for nausea -Is also advised to trial taking Pepto-Bismol for her upset stomach and diarrhea -Follow-up if no improvement after using Reglan. Consider CT scan of abdomen for other causes of nausea.  -Continue with brat diet -Once computer system came back on line I was able to see that the patient was given Protonix for hospital admission on 09/30/2011. Was no mention of nausea in this chart -prescription of 40 mg Protonix to be taken daily sent to pharmacy.   2. Diarrhea -See above

## 2015-02-08 NOTE — Progress Notes (Signed)
Pre visit review using our clinic review tool, if applicable. No additional management support is needed unless otherwise documented below in the visit note. 

## 2015-02-09 NOTE — Telephone Encounter (Signed)
Appointment made

## 2015-02-09 NOTE — Telephone Encounter (Signed)
I am happy to check her if it would make her more comfortable. Glad the nausea has resolved. Can use SDA slot if needed

## 2015-02-16 ENCOUNTER — Encounter: Payer: Self-pay | Admitting: Family Medicine

## 2015-02-16 ENCOUNTER — Ambulatory Visit (INDEPENDENT_AMBULATORY_CARE_PROVIDER_SITE_OTHER): Payer: Medicare Other | Admitting: Family Medicine

## 2015-02-16 VITALS — BP 144/88 | HR 91 | Temp 98.5°F | Wt 139.0 lb

## 2015-02-16 DIAGNOSIS — B9681 Helicobacter pylori [H. pylori] as the cause of diseases classified elsewhere: Secondary | ICD-10-CM

## 2015-02-16 DIAGNOSIS — R110 Nausea: Secondary | ICD-10-CM

## 2015-02-16 DIAGNOSIS — A048 Other specified bacterial intestinal infections: Secondary | ICD-10-CM

## 2015-02-16 DIAGNOSIS — E785 Hyperlipidemia, unspecified: Secondary | ICD-10-CM | POA: Diagnosis not present

## 2015-02-16 DIAGNOSIS — R197 Diarrhea, unspecified: Secondary | ICD-10-CM | POA: Diagnosis not present

## 2015-02-16 NOTE — Progress Notes (Signed)
Garret Reddish, MD  Subjective:  Krystal Craig is a 81 y.o. year old very pleasant female patient who presents with:  Nausea/diarrhea x 3 weeks.  -Patient has been experiencing GI issues for 3 weeks at this point primarily nausea and intermittent diarrhea. Saw Dr. Justin Mend on 01/30/15 but had been seen Saturday prior for nausea,vomiting, diarrhea in an urgent care. Patient denies the vomiting.  Takes phenergan and zofran but had difficult time controlling nausea despiet this. Reported heartburn and indigestion and has been taking antacids like tums or mylanta. Patient states mainly had nausea and denies any burning sensation in chest. Ate bland diet but continued issues. Omeprazole daily was started, brat diet. Labs came back positive for H. Pylori so was started on triple therapy including BID PPI and clarithromycin and amoxicillin. Patient called in 8/22 asking if she needed to be rechecked-nausea was better but diarrhea persisting. Saw [facility] on 02/08/15- she complained of nausea and diarrhea at that time. Said phenergan did not work well, zofran in past also did not help. Was given rx for reglan to be taken 4x a day as needed. Trial pepto bismol.   States she finished the antibiotics yesterday. Stopped antibiotics and omeprazole when she finished. No worsening of symptoms since she stopped. She tends to have really good and bad days. Still having nausea and diarrhea episodes. Nauseous this morning. Last diarrhea was yesterday and also had day before that. DID not vomit at all with this  ROS- Denies abdominal pain. No fevers. Has had chills. Weight stable 139. No night sweats.   Past Medical History-  Patient Active Problem List   Diagnosis Date Noted  . Psoriasis 02/28/2014    Priority: Medium  . CKD (chronic kidney disease), stage III 02/28/2014    Priority: Medium  . Hyperlipidemia 09/25/2011    Priority: Medium  . Adenocarcinoma of lung 08/03/2008    Priority: Medium  . Insomnia  08/03/2008    Priority: Medium  . Osteoarthritis 06/26/2007    Priority: Medium  . Vaginal dryness 09/12/2014    Priority: Low  . Microscopic hematuria 09/12/2014    Priority: Low  . Ear canal mass 09/08/2013    Priority: Low  . Substernal precordial chest pain 09/30/2011    Priority: Low  . Thoracic lymphadenopathy 09/25/2011    Priority: Low  . UTI'S, RECURRENT 08/03/2008    Priority: Low  . OSTEOPENIA 08/03/2008    Priority: Low  . ALLERGIC RHINITIS 06/26/2007    Priority: Low  . GERD 06/26/2007    Priority: Low  . HIATAL HERNIA 06/26/2007    Priority: Low  . Irritable bowel syndrome 06/26/2007    Priority: Low  . H. pylori infection 02/01/2015  . Lumbar disc herniation 01/05/2015  . Left groin pain 11/29/2014   Medications- reviewed and updated Current Outpatient Prescriptions  Medication Sig Dispense Refill  . alum & mag hydroxide-simeth (MAALOX/MYLANTA) 200-200-20 MG/5ML suspension Take 30 mLs by mouth every 6 (six) hours as needed for indigestion or heartburn.    . calcium carbonate (TUMS - DOSED IN MG ELEMENTAL CALCIUM) 500 MG chewable tablet Chew 3 tablets by mouth daily as needed for indigestion or heartburn.    . Calcium Carbonate-Vitamin D (CALCIUM-D) 600-400 MG-UNIT TABS Take 1 tablet by mouth 2 (two) times daily.    . Multiple Vitamins-Minerals (CENTRUM SILVER PO) Take 1 tablet by mouth daily.    . polyethylene glycol (MIRALAX / GLYCOLAX) packet Take 17 g by mouth daily as needed for mild constipation  or moderate constipation.     . promethazine (PHENERGAN) 25 MG tablet Take 1 tablet (25 mg total) by mouth every 6 (six) hours as needed for nausea or vomiting. 30 tablet 0  . conjugated estrogens (PREMARIN) vaginal cream Use cream once weekly as needed (Patient not taking: Reported on 02/16/2015) 42.5 g 1  . zolpidem (AMBIEN) 10 MG tablet Take 1 tablet (10 mg total) by mouth at bedtime as needed for sleep. (Patient not taking: Reported on 02/16/2015) 90 tablet 1    Objective: BP 144/88 mmHg  Pulse 91  Temp(Src) 98.5 F (36.9 C)  Wt 139 lb (63.05 kg) Gen: NAD, resting comfortably CV: RRR no murmurs rubs or gallops Lungs: CTAB no crackles, wheeze, rhonchi Abdomen: soft/nontender/nondistended/normal bowel sounds. No rebound or guarding.  Ext: no edema Skin: warm, dry Neuro: grossly normal, moves all extremities   Assessment/Plan:  8F now with 3 weeks of intermittent nausea and diarrhea. Just finished course of triple therapy with omeprazole, clarithromycin, amoxicillin for H. Pylori. Still having intermittent nausea and diarrhea. Could be coming from SE of antibiotics. Not sure if H. Pylori was really original cause of nausea/diarrhea in first place. Patient states indigestion symptoms were minimal in comparison to nausea and diarrhea. No exertional component to nausea to suggest CV cause. IgG was positive but this could have been from a prior infection. We will rule out stool infection with c. Diff, stool culture, and fecal lactoferring. Patient is uptodate with 02/08/14 colonoscopy was normal and was told no recall. Discussed to call back in 1 week, if still dealing with issues, would plan on CT abd/pelvis. Hopeful can get stools in tomorrow so we will have negative results before CT abd/pelvis. Likely will need BMET for CT with contrast and would also update TSH. Patient is going to stay off omeprazole. Phenergan most effective for nausea but minimal. Zofran and reglan did not help.   Touch base by phone 1 week, sooner if symptoms were to worsen  Orders Placed This Encounter  Procedures  . C. difficile, PCR    Standing Status: Future     Number of Occurrences: 1     Standing Expiration Date: 02/16/2016    Order Specific Question:  Is your patient experiencing loose or watery stools (3 or more in 24 hours)?    Answer:  Yes    Order Specific Question:  Has the patient received laxatives in the last 24 hours?    Answer:  No    Order Specific  Question:  Has a negative Cdiff test resulted in the last 7 days?    Answer:  No  . Stool culture    Standing Status: Future     Number of Occurrences: 1     Standing Expiration Date: 02/16/2016  . Fecal lactoferrin    Standing Status: Future     Number of Occurrences: 1     Standing Expiration Date: 02/16/2016

## 2015-02-16 NOTE — Assessment & Plan Note (Signed)
S: Dr. Justin Mend told patient to hold simvastatin.  A/P: We will continue Holding meds as no clear indication primary prevention. Consider repeat while off meds when patient is healthy.

## 2015-02-16 NOTE — Patient Instructions (Signed)
Stop by lab and get instructions to collect your stool- we are going to rule out infection  Give me a call next THursday, if you are still feeling nauseous, we will get a CT abdomen/pelvis with contrast  IF no infection in stool and CT normal, we would consider Gastroenterology (stomach doctor consult)

## 2015-02-17 ENCOUNTER — Other Ambulatory Visit: Payer: Self-pay | Admitting: Family Medicine

## 2015-02-18 LAB — CLOSTRIDIUM DIFFICILE BY PCR: CDIFFPCR: NOT DETECTED

## 2015-02-18 LAB — FECAL LACTOFERRIN, QUANT: LACTOFERRIN: NEGATIVE

## 2015-02-21 ENCOUNTER — Other Ambulatory Visit (INDEPENDENT_AMBULATORY_CARE_PROVIDER_SITE_OTHER): Payer: Medicare Other

## 2015-02-21 ENCOUNTER — Encounter: Payer: Self-pay | Admitting: Family Medicine

## 2015-02-21 DIAGNOSIS — R197 Diarrhea, unspecified: Secondary | ICD-10-CM | POA: Diagnosis not present

## 2015-02-21 LAB — BASIC METABOLIC PANEL
BUN: 16 mg/dL (ref 6–23)
CHLORIDE: 97 meq/L (ref 96–112)
CO2: 27 mEq/L (ref 19–32)
CREATININE: 0.94 mg/dL (ref 0.40–1.20)
Calcium: 9.6 mg/dL (ref 8.4–10.5)
GFR: 60.92 mL/min (ref >60.00)
GLUCOSE: 126 mg/dL — AB (ref 70–99)
POTASSIUM: 4.8 meq/L (ref 3.5–5.1)
Sodium: 133 mEq/L — ABNORMAL LOW (ref 135–145)

## 2015-02-21 LAB — TSH: TSH: 1.22 u[IU]/mL (ref 0.35–4.50)

## 2015-02-21 LAB — STOOL CULTURE

## 2015-02-22 ENCOUNTER — Encounter: Payer: Self-pay | Admitting: Family Medicine

## 2015-02-23 ENCOUNTER — Telehealth: Payer: Self-pay | Admitting: Family Medicine

## 2015-02-23 ENCOUNTER — Encounter: Payer: Self-pay | Admitting: Family Medicine

## 2015-02-23 DIAGNOSIS — R110 Nausea: Secondary | ICD-10-CM

## 2015-02-23 NOTE — Telephone Encounter (Signed)
I ordered CT. Please let patient know Krystal Craig will be setting this up and calling her

## 2015-02-23 NOTE — Telephone Encounter (Signed)
Pt call to say that she is still having the nausea and is asking if the CT scan is the next option.

## 2015-02-23 NOTE — Telephone Encounter (Signed)
mychart message from pt sent to Dr. Yong Channel

## 2015-03-01 ENCOUNTER — Ambulatory Visit (INDEPENDENT_AMBULATORY_CARE_PROVIDER_SITE_OTHER)
Admission: RE | Admit: 2015-03-01 | Discharge: 2015-03-01 | Disposition: A | Discharge status: Home / Self Care | Payer: Medicare Other | Source: Ambulatory Visit | Attending: Family Medicine | Admitting: Family Medicine

## 2015-03-01 DIAGNOSIS — R110 Nausea: Secondary | ICD-10-CM | POA: Diagnosis not present

## 2015-03-01 MED ORDER — IOHEXOL 300 MG/ML  SOLN
100.0000 mL | Freq: Once | INTRAMUSCULAR | Status: AC | PRN
  Administered 2015-03-01: 100 mL via INTRAVENOUS

## 2015-03-02 ENCOUNTER — Encounter: Payer: Self-pay | Admitting: Family Medicine

## 2015-03-03 ENCOUNTER — Ambulatory Visit (INDEPENDENT_AMBULATORY_CARE_PROVIDER_SITE_OTHER): Payer: Medicare Other | Admitting: Family Medicine

## 2015-03-03 ENCOUNTER — Encounter: Payer: Self-pay | Admitting: Family Medicine

## 2015-03-03 VITALS — BP 140/80 | HR 92 | Temp 98.5°F | Wt 136.0 lb

## 2015-03-03 DIAGNOSIS — R110 Nausea: Secondary | ICD-10-CM

## 2015-03-03 MED ORDER — PROMETHAZINE HCL 25 MG PO TABS: 25.0000 mg | ORAL_TABLET | Freq: Four times a day (QID) | ORAL | Status: DC | PRN

## 2015-03-03 NOTE — Progress Notes (Signed)
Garret Reddish, MD  Subjective:  Krystal Craig is a 81 y.o. year old very pleasant female patient who presents with:  Nausea/diarrhea x 5 weeks  From note 02/16/15: "Patient has been experiencing GI issues for 3 weeks at this point primarily nausea and intermittent diarrhea. Saw Dr. Justin Mend on 01/30/15 but had been seen Saturday prior for nausea,vomiting, diarrhea in an urgent care. Patient denies the vomiting.  Takes phenergan and zofran but had difficult time controlling nausea despiet this. Reported heartburn and indigestion and has been taking antacids like tums or mylanta. Patient states mainly had nausea and denies any burning sensation in chest. Ate bland diet but continued issues. Omeprazole daily was started, brat diet. Labs came back positive for H. Pylori so was started on triple therapy including BID PPI and clarithromycin and amoxicillin. Patient called in 8/22 asking if she needed to be rechecked-nausea was better but diarrhea persisting. Saw [facility] on 02/08/15- she complained of nausea and diarrhea at that time. Said phenergan did not work well, zofran in past also did not help. Was given rx for reglan to be taken 4x a day as needed. Trial pepto bismol.   States she finished the antibiotics yesterday. Stopped antibiotics and omeprazole when she finished. No worsening of symptoms since she stopped. She tends to have really good and bad days. Still having nausea and diarrhea episodes. Nauseous this morning. Last diarrhea was yesterday and also had day before that. DID not vomit at all with this ... A/P: 24F now with 3 weeks of intermittent nausea and diarrhea. Just finished course of triple therapy with omeprazole, clarithromycin, amoxicillin for H. Pylori. Still having intermittent nausea and diarrhea. Could be coming from SE of antibiotics. Not sure if H. Pylori was really original cause of nausea/diarrhea in first place. Patient states indigestion symptoms were minimal in comparison to  nausea and diarrhea. No exertional component to nausea to suggest CV cause. IgG was positive but this could have been from a prior infection. We will rule out stool infection with c. Diff, stool culture, and fecal lactoferring. Patient is uptodate with 02/08/14 colonoscopy was normal and was told no recall. Discussed to call back in 1 week, if still dealing with issues, would plan on CT abd/pelvis. Hopeful can get stools in tomorrow so we will have negative results before CT abd/pelvis. Likely will need BMET for CT with contrast and would also update TSH. Patient is going to stay off omeprazole. Phenergan most effective for nausea but minimal. Zofran and reglan did not help."   TSH, Bmet, c. Diff, stool culture and fecal lactoferrin came back negative. CT abd/pelvis without acute findings-there were "hypervascular foci within the proximal small bowel, likely prominent vessels. This unusual appearance is of questionable clinical significance". THere was also nodular liver surface but stable since 2006. Patient states diarrhea has calmed down and only 1-2x in last week. Nausea still present but some better. Feels really down due to all the medical illnesses she has endured this summer. Tried a probiotic but had some nausea. Takes phenergan in AM and helps some. Nausea overall improving but persistent.   ROS- Denies abdominal pain. No fevers. Has had chills. Weight stable 139 previously now down 3 lbs. No night sweats.   Past Medical History-  Patient Active Problem List   Diagnosis Date Noted  . Psoriasis 02/28/2014    Priority: Medium  . CKD (chronic kidney disease), stage III 02/28/2014    Priority: Medium  . Hyperlipidemia 09/25/2011  Priority: Medium  . Adenocarcinoma of lung 08/03/2008    Priority: Medium  . Insomnia 08/03/2008    Priority: Medium  . Osteoarthritis 06/26/2007    Priority: Medium  . Vaginal dryness 09/12/2014    Priority: Low  . Microscopic hematuria 09/12/2014     Priority: Low  . Ear canal mass 09/08/2013    Priority: Low  . Substernal precordial chest pain 09/30/2011    Priority: Low  . Thoracic lymphadenopathy 09/25/2011    Priority: Low  . UTI'S, RECURRENT 08/03/2008    Priority: Low  . OSTEOPENIA 08/03/2008    Priority: Low  . ALLERGIC RHINITIS 06/26/2007    Priority: Low  . GERD 06/26/2007    Priority: Low  . HIATAL HERNIA 06/26/2007    Priority: Low  . Irritable bowel syndrome 06/26/2007    Priority: Low  . H. pylori infection 02/01/2015  . Lumbar disc herniation 01/05/2015  . Left groin pain 11/29/2014   Medications- reviewed and updated Current Outpatient Prescriptions  Medication Sig Dispense Refill  . alum & mag hydroxide-simeth (MAALOX/MYLANTA) 200-200-20 MG/5ML suspension Take 30 mLs by mouth every 6 (six) hours as needed for indigestion or heartburn.    . calcium carbonate (TUMS - DOSED IN MG ELEMENTAL CALCIUM) 500 MG chewable tablet Chew 3 tablets by mouth daily as needed for indigestion or heartburn.    . Calcium Carbonate-Vitamin D (CALCIUM-D) 600-400 MG-UNIT TABS Take 1 tablet by mouth 2 (two) times daily.    . Multiple Vitamins-Minerals (CENTRUM SILVER PO) Take 1 tablet by mouth daily.    . polyethylene glycol (MIRALAX / GLYCOLAX) packet Take 17 g by mouth daily as needed for mild constipation or moderate constipation.     . promethazine (PHENERGAN) 25 MG tablet Take 1 tablet (25 mg total) by mouth every 6 (six) hours as needed for nausea or vomiting. 30 tablet 0  . conjugated estrogens (PREMARIN) vaginal cream Use cream once weekly as needed (Patient not taking: Reported on 02/16/2015) 42.5 g 1  . zolpidem (AMBIEN) 10 MG tablet Take 1 tablet (10 mg total) by mouth at bedtime as needed for sleep. (Patient not taking: Reported on 02/16/2015) 90 tablet 1   Objective: BP 140/80 mmHg  Pulse 92  Temp(Src) 98.5 F (36.9 C)  Wt 136 lb (61.689 kg) Gen: NAD, resting comfortably CV: RRR no murmurs rubs or gallops Lungs: CTAB no  crackles, wheeze, rhonchi Abdomen: soft/nontender/nondistended/normal bowel sounds. No rebound or guarding.  Ext: no edema Skin: warm, dry Neuro: grossly normal, moves all extremities  Psych: depressed mood  Assessment/Plan:  37F now with 5 weeks of intermittent nausea and diarrhea. In this time has been treated for H. Pylori adequately with only mild improvement in symptoms. Symptoms continue to improve and diarrhea now rare, nausea much less prominent but still using daily phenergan. Extensive workup in HPI noted. CT ruled out mesenteric ischemia. At this point, refer to GI for there thoughts on odd CT findings as well as potential EGD consideration. I refilled phenergan. Also advised to try [place] as probiotic pill increased nausea. She requests treatment for depression but I think using another pill at this point could complicate picture so we will use counseling. If GI workup unremarkable- consider workup for other causes chronic nausea including porphyria and addison's though doubt. No DM so this is not an anginal equivalent and no exertional component.   Orders Placed This Encounter  Procedures  . Ambulatory referral to Gastroenterology    Referral Priority:  Routine  Referral Type:  Consultation    Referral Reason:  Specialty Services Required    Number of Visits Requested:  1

## 2015-03-03 NOTE — Patient Instructions (Signed)
Refilled phenergan  We will call you within a week about your referral to GI. If you do not hear within 2 weeks, give [name] a call.   Eat 1 cup of activia a day if you can tolerate it  Call to get a visit with one of our counselors

## 2015-03-06 ENCOUNTER — Encounter (HOSPITAL_COMMUNITY): Payer: Self-pay

## 2015-03-06 ENCOUNTER — Emergency Department (HOSPITAL_COMMUNITY)
Admission: EM | Admit: 2015-03-06 | Discharge: 2015-03-07 | Payer: No Typology Code available for payment source | Attending: Emergency Medicine | Admitting: Emergency Medicine

## 2015-03-06 DIAGNOSIS — R231 Pallor: Secondary | ICD-10-CM | POA: Diagnosis not present

## 2015-03-06 DIAGNOSIS — Z87440 Personal history of urinary (tract) infections: Secondary | ICD-10-CM | POA: Diagnosis not present

## 2015-03-06 DIAGNOSIS — Z8639 Personal history of other endocrine, nutritional and metabolic disease: Secondary | ICD-10-CM | POA: Diagnosis not present

## 2015-03-06 DIAGNOSIS — Z872 Personal history of diseases of the skin and subcutaneous tissue: Secondary | ICD-10-CM | POA: Insufficient documentation

## 2015-03-06 DIAGNOSIS — Z87891 Personal history of nicotine dependence: Secondary | ICD-10-CM | POA: Insufficient documentation

## 2015-03-06 DIAGNOSIS — F419 Anxiety disorder, unspecified: Secondary | ICD-10-CM | POA: Diagnosis not present

## 2015-03-06 DIAGNOSIS — M8580 Other specified disorders of bone density and structure, unspecified site: Secondary | ICD-10-CM | POA: Diagnosis not present

## 2015-03-06 DIAGNOSIS — Z79899 Other long term (current) drug therapy: Secondary | ICD-10-CM | POA: Diagnosis not present

## 2015-03-06 DIAGNOSIS — Z85118 Personal history of other malignant neoplasm of bronchus and lung: Secondary | ICD-10-CM | POA: Diagnosis not present

## 2015-03-06 DIAGNOSIS — G4700 Insomnia, unspecified: Secondary | ICD-10-CM | POA: Insufficient documentation

## 2015-03-06 DIAGNOSIS — R110 Nausea: Secondary | ICD-10-CM | POA: Insufficient documentation

## 2015-03-06 DIAGNOSIS — M549 Dorsalgia, unspecified: Secondary | ICD-10-CM | POA: Diagnosis not present

## 2015-03-06 DIAGNOSIS — F329 Major depressive disorder, single episode, unspecified: Secondary | ICD-10-CM | POA: Diagnosis not present

## 2015-03-06 DIAGNOSIS — K219 Gastro-esophageal reflux disease without esophagitis: Secondary | ICD-10-CM | POA: Diagnosis not present

## 2015-03-06 DIAGNOSIS — M1990 Unspecified osteoarthritis, unspecified site: Secondary | ICD-10-CM | POA: Insufficient documentation

## 2015-03-06 LAB — CBC WITH DIFFERENTIAL/PLATELET
BASOS PCT: 0 %
Basophils Absolute: 0 10*3/uL (ref 0.0–0.1)
EOS ABS: 0.1 10*3/uL (ref 0.0–0.7)
EOS PCT: 1 %
HEMATOCRIT: 41.7 % (ref 36.0–46.0)
Hemoglobin: 13.5 g/dL (ref 12.0–15.0)
Lymphocytes Relative: 18 %
Lymphs Abs: 1.6 10*3/uL (ref 0.7–4.0)
MCH: 28.9 pg (ref 26.0–34.0)
MCHC: 32.4 g/dL (ref 30.0–36.0)
MCV: 89.3 fL (ref 78.0–100.0)
MONO ABS: 0.5 10*3/uL (ref 0.1–1.0)
MONOS PCT: 6 %
Neutro Abs: 6.5 10*3/uL (ref 1.7–7.7)
Neutrophils Relative %: 75 %
PLATELETS: 325 10*3/uL (ref 150–400)
RBC: 4.67 MIL/uL (ref 3.87–5.11)
RDW: 13.1 % (ref 11.5–15.5)
WBC: 8.8 10*3/uL (ref 4.0–10.5)

## 2015-03-06 LAB — URINE MICROSCOPIC-ADD ON

## 2015-03-06 LAB — I-STAT CHEM 8, ED
BUN: 13 mg/dL (ref 6–20)
CREATININE: 1 mg/dL (ref 0.44–1.00)
Calcium, Ion: 1.21 mmol/L (ref 1.13–1.30)
Chloride: 100 mmol/L — ABNORMAL LOW (ref 101–111)
Glucose, Bld: 107 mg/dL — ABNORMAL HIGH (ref 65–99)
HEMATOCRIT: 44 % (ref 36.0–46.0)
HEMOGLOBIN: 15 g/dL (ref 12.0–15.0)
Potassium: 4.4 mmol/L (ref 3.5–5.1)
SODIUM: 135 mmol/L (ref 135–145)
TCO2: 23 mmol/L (ref 0–100)

## 2015-03-06 LAB — RAPID URINE DRUG SCREEN, HOSP PERFORMED
Amphetamines: NOT DETECTED
BARBITURATES: NOT DETECTED
Benzodiazepines: NOT DETECTED
COCAINE: NOT DETECTED
Opiates: NOT DETECTED
Tetrahydrocannabinol: NOT DETECTED

## 2015-03-06 LAB — URINALYSIS, ROUTINE W REFLEX MICROSCOPIC
BILIRUBIN URINE: NEGATIVE
Glucose, UA: NEGATIVE mg/dL
KETONES UR: NEGATIVE mg/dL
NITRITE: NEGATIVE
PH: 6.5 (ref 5.0–8.0)
Protein, ur: NEGATIVE mg/dL
Specific Gravity, Urine: 1.012 (ref 1.005–1.030)
Urobilinogen, UA: 0.2 mg/dL (ref 0.0–1.0)

## 2015-03-06 LAB — ETHANOL

## 2015-03-06 MED ORDER — ALUM & MAG HYDROXIDE-SIMETH 200-200-20 MG/5ML PO SUSP: 30.0000 mL | Freq: Four times a day (QID) | ORAL | Status: DC | PRN

## 2015-03-06 MED ORDER — ACETAMINOPHEN 325 MG PO TABS: 650.0000 mg | ORAL_TABLET | Freq: Three times a day (TID) | ORAL | Status: DC | PRN

## 2015-03-06 MED ORDER — ACETAMINOPHEN ER 650 MG PO TBCR: 650.0000 mg | EXTENDED_RELEASE_TABLET | Freq: Three times a day (TID) | ORAL | Status: DC | PRN

## 2015-03-06 MED ORDER — ZOLPIDEM TARTRATE 10 MG PO TABS
10.0000 mg | ORAL_TABLET | Freq: Every evening | ORAL | Status: DC | PRN
Start: 2015-03-06 — End: 2015-03-07
  Administered 2015-03-06: 10 mg via ORAL
  Filled 2015-03-06: qty 1

## 2015-03-06 NOTE — ED Notes (Signed)
In bathroom. Steady Gait.

## 2015-03-06 NOTE — ED Notes (Signed)
Pt started having pain in back in June.  Pt had surgery for her back. Pain continued.  Pt also dx with h-pylori and has on/off nausea.  Pt now with new onset anxiety.  Feels jittery.  Doesn't feel normal.

## 2015-03-06 NOTE — ED Notes (Signed)
TTS staff at the bedside for assessment

## 2015-03-06 NOTE — ED Notes (Signed)
Bed: YW90 Expected date:  Expected time:  Means of arrival:  Comments: RM25

## 2015-03-06 NOTE — ED Notes (Signed)
Pt requesting Ambien 10 mg for sleep

## 2015-03-06 NOTE — ED Provider Notes (Addendum)
CSN: [id number]     Arrival date & time 03/06/15  1347 History   First MD Initiated Contact with Patient 03/06/15 1722     Chief Complaint  Patient presents with  . Suicidal  . Depression  . Anxiety     (Consider location/radiation/quality/duration/timing/severity/associated sxs/prior Treatment) HPI Comments: Patient has been ill since June, with one thing or another, and she's been becoming depressed over her health.  She had back surgery in June.  She was treated for a peptic ulcer was recommended by her primary care physician that she see a counselor.  She does have an appointment tomorrow but couldn't wait until then.  She states she's been taking Ambien to help her sleep since June.  Sometimes she sleeps all night.  Other times she'll sleep for 5 hours.  She states she is making herself eat even now.  She has no appetite.  She's had passive suicide thoughts without any plan.  Over the past week.  Patient is a 81 y.o. female presenting with depression and anxiety. The history is provided by the patient.  Depression This is a new problem. The current episode started more than 1 month ago. The problem occurs constantly. The problem has been gradually worsening. Associated symptoms include nausea. Pertinent negatives include no chest pain, headaches or vomiting. Nothing aggravates the symptoms. She has tried nothing for the symptoms. The treatment provided no relief.  Anxiety Associated symptoms include nausea. Pertinent negatives include no chest pain, headaches or vomiting.    Past Medical History  Diagnosis Date  . Allergic rhinitis   . History of bronchitis   . Pure hypercholesterolemia   . Hiatal hernia   . GERD (gastroesophageal reflux disease)   . Diverticulosis of colon   . IBS (irritable bowel syndrome)   . Urinary tract infection, site not specified   . Osteoarthritis   . Lumbar back pain   . Osteopenia   . Insomnia   . Complication of anesthesia   . PONV (postoperative  nausea and vomiting)   . Chest pain 09/24/2011    normal stress test  . Ulcer   . DIVERTICULOSIS OF COLON 08/03/2008    Qualifier: Diagnosis of  By: Lenna Gilford MD, Deborra Medina   . H/O foot surgery   . Malignant neoplasm of bronchus and lung, unspecified site 2006   Past Surgical History  Procedure Laterality Date  . Appendectomy  1969  . Abdominal hysterectomy  1969  . Cholecystectomy  1976  . Bilateral shouler operations for impingement syndromes  2001    Dr. Eddie Dibbles  . Left lower lobectomy  11/2004    via VATS and mini-thoractomy for 1.5cm adenoca of lung by Dr. Arlyce Dice  . Urologic sugery with cystocele/rectocele repairs and sling  03/2007    Dr. Terance Hart  . Bilateral cataract surgery  2012    Dr. Talbert Forest  . Turmor ear Right 09/2013  . Appendectomy    . Laparoscopic lysis intestinal adhesions    . Lumbar laminectomy/decompression microdiscectomy Left 01/05/2015    Procedure: L3-4 DECOMPRESSION MICRODISCECTOMY ON LEFT;  Surgeon: Melina Schools, MD;  Location: [facility];  Service: Orthopedics;  Laterality: Left;  left Lumbar 3-4   Family History  Problem Relation Age of Onset  . Colon cancer Neg Hx   . Esophageal cancer Neg Hx   . Pancreatic cancer Neg Hx   . Rectal cancer Neg Hx   . Stomach cancer Neg Hx   . Breast cancer Sister 65  . Dementia Mother   .  Heart attack Mother   . CVA Father   . Stroke Father    Social History  Substance Use Topics  . Smoking status: Former Smoker -- 1.00 packs/day for 30 years    Types: Cigarettes    Quit date: 06/17/1984  . Smokeless tobacco: Never Used  . Alcohol Use: No   OB History    Gravida Para Term Preterm AB TAB SAB Ectopic Multiple Living   '2 2        2     '$ Review of Systems  Respiratory: Negative for shortness of breath.   Cardiovascular: Negative for chest pain.  Gastrointestinal: Positive for nausea. Negative for vomiting.  Genitourinary: Negative for dysuria.  Musculoskeletal: Positive for back pain.  Neurological: Negative for  dizziness and headaches.  Psychiatric/Behavioral: Positive for depression and suicidal ideas. The patient is nervous/anxious.   All other systems reviewed and are negative.     Allergies  Codeine  Home Medications   Prior to Admission medications   Medication Sig Start Date End Date Taking? Authorizing Provider  acetaminophen (TYLENOL) 650 MG CR tablet Take 650 mg by mouth every 8 (eight) hours as needed for pain.   Yes Historical Provider, MD  alum & mag hydroxide-simeth (MAALOX/MYLANTA) 200-200-20 MG/5ML suspension Take 30 mLs by mouth every 6 (six) hours as needed for indigestion or heartburn.   Yes Historical Provider, MD  benzocaine-resorcinol (VAGISIL) 5-2 % vaginal cream Place 1 application vaginally at bedtime.   Yes Historical Provider, MD  calcium carbonate (TUMS - DOSED IN MG ELEMENTAL CALCIUM) 500 MG chewable tablet Chew 3 tablets by mouth daily as needed for indigestion or heartburn.   Yes Historical Provider, MD  promethazine (PHENERGAN) 25 MG tablet Take 1 tablet (25 mg total) by mouth every 6 (six) hours as needed for nausea or vomiting. 03/03/15  Yes Marin Olp, MD  traMADol (ULTRAM) 50 MG tablet Take 50 mg by mouth every 6 (six) hours as needed for severe pain.   Yes Historical Provider, MD  zolpidem (AMBIEN) 10 MG tablet Take 1 tablet (10 mg total) by mouth at bedtime as needed for sleep. Patient taking differently: Take 10 mg by mouth at bedtime.  09/20/14 08/04/15 Yes Noralee Space, MD  Multiple Vitamins-Minerals (CENTRUM SILVER PO) Take 1 tablet by mouth daily.    Historical Provider, MD   BP 118/51 mmHg  Pulse 78  Temp(Src) 98.5 F (36.9 C) (Oral)  Resp 18  SpO2 98% Physical Exam  Constitutional: She is oriented to person, place, and time. She appears well-developed and well-nourished.  HENT:  Head: Normocephalic.  Eyes: Pupils are equal, round, and reactive to light.  Neck: Normal range of motion.  Cardiovascular: Normal rate and regular rhythm.    Pulmonary/Chest: Effort normal and breath sounds normal.  Musculoskeletal: Normal range of motion.  Neurological: She is alert and oriented to person, place, and time.  Skin: There is pallor.  Psychiatric: Her speech is normal. Judgment normal. Her mood appears anxious. Cognition and memory are normal. She exhibits a depressed mood. She expresses suicidal ideation. She expresses no suicidal plans.  Nursing note and vitals reviewed.   ED Course  Procedures (including critical care time) Labs Review Labs Reviewed  URINALYSIS, ROUTINE W REFLEX MICROSCOPIC (NOT AT [facility]) - Abnormal; Notable for the following:    APPearance CLOUDY (*)    Hgb urine dipstick MODERATE (*)    Leukocytes, UA TRACE (*)    All other components within normal limits  URINE MICROSCOPIC-ADD ON -  Abnormal; Notable for the following:    Squamous Epithelial / LPF FEW (*)    Bacteria, UA MANY (*)    All other components within normal limits  I-STAT CHEM 8, ED - Abnormal; Notable for the following:    Chloride 100 (*)    Glucose, Bld 107 (*)    All other components within normal limits  CBC WITH DIFFERENTIAL/PLATELET  URINE RAPID DRUG SCREEN, HOSP PERFORMED  ETHANOL    Imaging Review No results found. I have personally reviewed and evaluated these images and lab results as part of my medical decision-making.   EKG Interpretation   Date/Time:  Tuesday March 07 2015 09:35:34 EDT Ventricular Rate:  77 PR Interval:  126 QRS Duration: 90 QT Interval:  370 QTC Calculation: 418 R Axis:   -45 Text Interpretation:  Normal sinus rhythm Left axis deviation Abnormal ECG  Confirmed by [facility]  MD, TREY ([id number]) on 03/07/2015 9:46:46 AM     Obtain baseline labs, urine, and have patient speak with our TTS specialist Patient's labs have been reviewed.  She is medically cleared for TTS evaluation MDM   Final diagnoses:  None         Junius Creamer, NP 03/06/15 1916  Leonard Schwartz, MD 03/06/15 1927  Junius Creamer, NP 03/14/15 5615  Leonard Schwartz, MD 03/17/15 [phone]

## 2015-03-06 NOTE — BH Assessment (Addendum)
Assessment Note   Krystal Craig is an 81 y.o. female who came to the emergency department with complaints of panic, anxiety, depression and suicidal thoughts that started about 2 weeks ago. She states that she has never had a history of depression or anxiety in the past and it just started after her back surgery that she had in June. She states that she has "never felt like this before and isn't coping well." She states that she is hopeless and "wants to die" but doesn't have a plan. She states that she feels like she can't control her thoughts or her body and she has "knots in her stomach". She is visibly shaking on assessment. She states that she has an appointment with a counselor in the morning but she felt like she couldn't wait that long. She says that she went to her primary care doctor Dr. Rushie Chestnut but he suggested to try counseling first. She currently lives with her husband who has dementia. Her daughter-in-law in by the bedside and states that she has never seen her like this before. She states that she went over to check on her today and the pt was afraid to be left alone so they took her to the hospital. Pt has no history of substance abuse issues, HI or A/V hallucinations.   Disposition: Inpatient geropsych placement recommended per Waylan Boga, NP   Axis I: 309.28 Adjustment disorder with depressed mood and anxiety Axis II: Deferred Axis III:  Past Medical History  Diagnosis Date  . Allergic rhinitis   . History of bronchitis   . Pure hypercholesterolemia   . Hiatal hernia   . GERD (gastroesophageal reflux disease)   . Diverticulosis of colon   . IBS (irritable bowel syndrome)   . Urinary tract infection, site not specified   . Osteoarthritis   . Lumbar back pain   . Osteopenia   . Insomnia   . Complication of anesthesia   . PONV (postoperative nausea and vomiting)   . Chest pain 09/24/2011    normal stress test  . Ulcer   . DIVERTICULOSIS OF COLON 08/03/2008     Qualifier: Diagnosis of  By: Lenna Gilford MD, Deborra Medina   . H/O foot surgery   . Malignant neoplasm of bronchus and lung, unspecified site 2006   Axis IV: other psychosocial or environmental problems Axis V: 41-50 serious symptoms  Past Medical History:  Past Medical History  Diagnosis Date  . Allergic rhinitis   . History of bronchitis   . Pure hypercholesterolemia   . Hiatal hernia   . GERD (gastroesophageal reflux disease)   . Diverticulosis of colon   . IBS (irritable bowel syndrome)   . Urinary tract infection, site not specified   . Osteoarthritis   . Lumbar back pain   . Osteopenia   . Insomnia   . Complication of anesthesia   . PONV (postoperative nausea and vomiting)   . Chest pain 09/24/2011    normal stress test  . Ulcer   . DIVERTICULOSIS OF COLON 08/03/2008    Qualifier: Diagnosis of  By: Lenna Gilford MD, Deborra Medina   . H/O foot surgery   . Malignant neoplasm of bronchus and lung, unspecified site 2006    Past Surgical History  Procedure Laterality Date  . Appendectomy  1969  . Abdominal hysterectomy  1969  . Cholecystectomy  1976  . Bilateral shouler operations for impingement syndromes  2001    Dr. Eddie Dibbles  . Left lower lobectomy  11/2004  via VATS and mini-thoractomy for 1.5cm adenoca of lung by Dr. Arlyce Dice  . Urologic sugery with cystocele/rectocele repairs and sling  03/2007    Dr. Terance Hart  . Bilateral cataract surgery  2012    Dr. Talbert Forest  . Turmor ear Right 09/2013  . Appendectomy    . Laparoscopic lysis intestinal adhesions    . Lumbar laminectomy/decompression microdiscectomy Left 01/05/2015    Procedure: L3-4 DECOMPRESSION MICRODISCECTOMY ON LEFT;  Surgeon: Melina Schools, MD;  Location: [facility];  Service: Orthopedics;  Laterality: Left;  left Lumbar 3-4    Family History:  Family History  Problem Relation Age of Onset  . Colon cancer Neg Hx   . Esophageal cancer Neg Hx   . Pancreatic cancer Neg Hx   . Rectal cancer Neg Hx   . Stomach cancer Neg Hx   . Breast  cancer Sister 32  . Dementia Mother   . Heart attack Mother   . CVA Father   . Stroke Father     Social History:  reports that she quit smoking about 30 years ago. Her smoking use included Cigarettes. She has a 30 pack-year smoking history. She has never used smokeless tobacco. She reports that she does not drink alcohol or use illicit drugs.  Additional Social History:  Alcohol / Drug Use History of alcohol / drug use?: No history of alcohol / drug abuse  CIWA: CIWA-Ar BP: 133/71 mmHg Pulse Rate: 83 COWS:    PATIENT STRENGTHS: (choose at least two) Average or above average intelligence Motivation for treatment/growth  Allergies:  Allergies  Allergen Reactions  . Codeine     Chest pain/tightness    Home Medications:  (Not in a hospital admission)  OB/GYN Status:  No LMP recorded. Patient has had a hysterectomy.  General Assessment Data Location of Assessment: [facility] TTS Assessment: In system Is this a Tele or Face-to-Face Assessment?: Face-to-Face Is this an Initial Assessment or a Re-assessment for this encounter?: Initial Assessment Marital status: Married Is patient pregnant?: No Pregnancy Status: No Living Arrangements: Spouse/significant other Can pt return to current living arrangement?: Yes Admission Status: Voluntary Is patient capable of signing voluntary admission?: Yes Referral Source: Self/Family/Friend Insurance type: [place] Living Arrangements: Spouse/significant other Name of Psychiatrist: None Name of Therapist: Richardo Priest LCSW  Education Status Is patient currently in school?: No Highest grade of school patient has completed: 12th  Risk to self with the past 6 months Suicidal Ideation: Yes-Currently Present Has patient been a risk to self within the past 6 months prior to admission? : Yes Suicidal Intent: No Has patient had any suicidal intent within the past 6 months prior to admission? : No Is patient at risk for  suicide?: Yes Suicidal Plan?: No Has patient had any suicidal plan within the past 6 months prior to admission? : No Access to Means: No What has been your use of drugs/alcohol within the last 12 months?: no substance use Previous Attempts/Gestures: No How many times?: 0 Other Self Harm Risks: none Triggers for Past Attempts: None known Intentional Self Injurious Behavior: None Family Suicide History: No Recent stressful life event(s): Recent negative physical changes Persecutory voices/beliefs?: No Depression: Yes Depression Symptoms: Despondent, Insomnia, Fatigue Substance abuse history and/or treatment for substance abuse?: No Suicide prevention information given to non-admitted patients: Not applicable  Risk to Others within the past 6 months Homicidal Ideation: No Does patient have any lifetime risk of violence toward others beyond the six months prior  to admission? : No Thoughts of Harm to Others: No Current Homicidal Intent: No Current Homicidal Plan: No Access to Homicidal Means: No Identified Victim: none History of harm to others?: No Assessment of Violence: None Noted Violent Behavior Description: none Does patient have access to weapons?: No Criminal Charges Pending?: No Does patient have a court date: No Is patient on probation?: No  Psychosis Hallucinations: None noted Delusions: None noted  Mental Status Report Appearance/Hygiene: Unremarkable Eye Contact: Good Motor Activity: Freedom of movement Speech: Logical/coherent Level of Consciousness: Alert Mood: Depressed Affect: Depressed Anxiety Level: Panic Attacks Thought Processes: Coherent Judgement: Partial Orientation: Person, Place, Time, Situation Obsessive Compulsive Thoughts/Behaviors: None  Cognitive Functioning Concentration: Poor Memory: Recent Intact, Remote Intact IQ: Average Insight: Fair Impulse Control: Good Appetite: Poor Weight Loss: 10 Weight Gain: 0 Sleep: Decreased Total  Hours of Sleep: 6 Vegetative Symptoms: Unable to Assess  ADLScreening [facility] Assessment Services) Patient's cognitive ability adequate to safely complete daily activities?: Yes Patient able to express need for assistance with ADLs?: Yes Independently performs ADLs?: Yes (appropriate for developmental age)  Prior Inpatient Therapy Prior Inpatient Therapy: No  Prior Outpatient Therapy Prior Outpatient Therapy: No Does patient have an ACCT team?: No Does patient have Intensive In-House Services?  : No Does patient have [facility] services? : No Does patient have P4CC services?: No  ADL Screening (condition at time of admission) Patient's cognitive ability adequate to safely complete daily activities?: Yes Is the patient deaf or have difficulty hearing?: No Does the patient have difficulty seeing, even when wearing glasses/contacts?: No Does the patient have difficulty concentrating, remembering, or making decisions?: No Patient able to express need for assistance with ADLs?: Yes Does the patient have difficulty dressing or bathing?: No Independently performs ADLs?: Yes (appropriate for developmental age) Does the patient have difficulty walking or climbing stairs?: Yes Weakness of Legs: Both Weakness of Arms/Hands: Both  Home Assistive Devices/Equipment Home Assistive Devices/Equipment: None  Therapy Consults (therapy consults require a physician order) PT Evaluation Needed: No OT Evalulation Needed: No SLP Evaluation Needed: No Abuse/Neglect Assessment (Assessment to be complete while patient is alone) Physical Abuse: Denies Verbal Abuse: Denies Sexual Abuse: Denies Exploitation of patient/patient's resources: Denies Self-Neglect: Denies Values / Beliefs Cultural Requests During Hospitalization: None Spiritual Requests During Hospitalization: None Consults Spiritual Care Consult Needed: No Social Work Consult Needed: No Regulatory affairs officer (For Healthcare) Does patient have  an advance directive?: No Would patient like information on creating an advanced directive?: No - patient declined information    Additional Information 1:1 In Past 12 Months?: No CIRT Risk: No Elopement Risk: No Does patient have medical clearance?: Yes     Disposition:  Disposition Initial Assessment Completed for this Encounter: Yes Disposition of Patient: Inpatient treatment program Type of inpatient treatment program: Adult  Yaviel Kloster 03/06/2015 6:36 PM

## 2015-03-06 NOTE — ED Notes (Signed)
Spoke in length with the family and patient about the plan of care. Please contact Monica Martinez, pt spouse at [phone]

## 2015-03-06 NOTE — ED Notes (Signed)
Staffing called regarding sitter case.

## 2015-03-06 NOTE — ED Notes (Signed)
Bed: AQ56 Expected date:  Expected time:  Means of arrival:  Comments: RM25

## 2015-03-07 ENCOUNTER — Ambulatory Visit: Payer: Medicare Other | Admitting: Licensed Clinical Social Worker

## 2015-03-07 MED ORDER — LORAZEPAM 1 MG PO TABS
1.0000 mg | ORAL_TABLET | Freq: Two times a day (BID) | ORAL | Status: DC | PRN
  Administered 2015-03-07: 1 mg via ORAL
  Filled 2015-03-07: qty 1

## 2015-03-07 NOTE — ED Notes (Addendum)
Pt's son to bedside, EKG completed

## 2015-03-07 NOTE — Progress Notes (Signed)
Pt accepted to [facility] by Dr. Susann Givens per Shirlee Limerick. Report # is [phone]. Pt can arrive after 2:30pm.   Spoke with [facility] re: pt's placement.   Sharren Bridge, MSW, LCSW Clinical Social Work, Disposition  03/07/2015 [phone]

## 2015-03-07 NOTE — Progress Notes (Signed)
[facility] requested EKG. CSW spoke with [place], [place] Work  [employer] [phone]

## 2015-03-07 NOTE — Progress Notes (Signed)
Shirlee Limerick at [facility] states pt is accepted pending receiving EKG results. Shirlee Limerick is faxing voluntary consent forms for pt to sign. Will follow up with bed number and accepting information when support paperwork is completed.  Sharren Bridge, MSW, LCSW Clinical Social Work, Disposition  03/07/2015 [phone]

## 2015-03-07 NOTE — BHH Counselor (Signed)
Referral packet sent to [facility] Boozer, and [place] on 03/07/15 at 1:47 a.m. Shean K. Harris, MS, [place], LCAS-A  Counselor 03/07/2015 2:17 AM

## 2015-03-07 NOTE — ED Notes (Signed)
Pt has been in bed throughout the night except to use the bathroom

## 2015-03-07 NOTE — BH Assessment (Signed)
[facility] Assessment Progress Note  Sharren Bridge, LCSW calls from [facility] to report that Dr Lowella Grip has agreed to accept this pt to [facility] with an understanding that pt is not to be transported until after 14:30.  Please see her note in EPIC for further details.  Charmaine Downs, NP concurs with this decision.  Pt is under voluntary status and is to be transported via [facility].  Pt's nurse has been notified.  Jalene Mullet, [place] Triage Specialist [phone]

## 2015-03-15 ENCOUNTER — Ambulatory Visit: Payer: Medicare Other | Admitting: Family Medicine

## 2015-03-16 ENCOUNTER — Telehealth: Payer: Self-pay | Admitting: Family Medicine

## 2015-03-16 MED ORDER — TRAMADOL HCL 50 MG PO TABS: 50.0000 mg | ORAL_TABLET | Freq: Four times a day (QID) | ORAL | Status: DC | PRN

## 2015-03-16 NOTE — Telephone Encounter (Signed)
[facility] - [place], [place] [phone]  Requesting refill of traMADol (ULTRAM) 50 MG tablet

## 2015-03-16 NOTE — Telephone Encounter (Signed)
Yes thanks 

## 2015-03-16 NOTE — Telephone Encounter (Signed)
rx faxed to [facility]

## 2015-03-16 NOTE — Telephone Encounter (Signed)
Refill ok? 

## 2015-03-20 ENCOUNTER — Encounter: Payer: Self-pay | Admitting: Family Medicine

## 2015-03-21 ENCOUNTER — Ambulatory Visit: Payer: Medicare Other | Admitting: Physician Assistant

## 2015-03-23 ENCOUNTER — Ambulatory Visit: Payer: Medicare Other | Admitting: Family Medicine

## 2015-03-24 ENCOUNTER — Telehealth: Payer: Self-pay | Admitting: Family Medicine

## 2015-03-24 NOTE — Telephone Encounter (Signed)
Spoke with pharmacy to ok TEVA brand.

## 2015-03-24 NOTE — Telephone Encounter (Signed)
Also wants you to know that her cost is going from $7.00 a refill this year to $111.00 next year.

## 2015-03-24 NOTE — Telephone Encounter (Signed)
Pt received #90 Tramadol from [facility] and the brand was supposed to be TEVA. It was not the TEVA brand and she usually receives #270. [facility] will also be contacting you.

## 2015-04-05 ENCOUNTER — Encounter: Payer: Self-pay | Admitting: Family Medicine

## 2015-04-05 ENCOUNTER — Ambulatory Visit (INDEPENDENT_AMBULATORY_CARE_PROVIDER_SITE_OTHER): Payer: Medicare Other | Admitting: Family Medicine

## 2015-04-05 VITALS — BP 138/70 | HR 67 | Temp 98.8°F | Wt 138.0 lb

## 2015-04-05 DIAGNOSIS — F418 Other specified anxiety disorders: Secondary | ICD-10-CM

## 2015-04-05 DIAGNOSIS — F32A Depression, unspecified: Secondary | ICD-10-CM

## 2015-04-05 DIAGNOSIS — Z23 Encounter for immunization: Secondary | ICD-10-CM

## 2015-04-05 DIAGNOSIS — F329 Major depressive disorder, single episode, unspecified: Secondary | ICD-10-CM

## 2015-04-05 DIAGNOSIS — F419 Anxiety disorder, unspecified: Secondary | ICD-10-CM

## 2015-04-05 DIAGNOSIS — E785 Hyperlipidemia, unspecified: Secondary | ICD-10-CM

## 2015-04-05 DIAGNOSIS — F411 Generalized anxiety disorder: Secondary | ICD-10-CM | POA: Insufficient documentation

## 2015-04-05 LAB — LDL CHOLESTEROL, DIRECT: Direct LDL: 113 mg/dL

## 2015-04-05 MED ORDER — TRAMADOL HCL 50 MG PO TABS: 50.0000 mg | ORAL_TABLET | Freq: Four times a day (QID) | ORAL | Status: DC | PRN

## 2015-04-05 NOTE — Progress Notes (Addendum)
Garret Reddish, MD  Subjective:  Krystal Craig is a 81 y.o. year old very pleasant female patient who presents for/with See problem oriented charting ROS- no chest pain, shortness of breath, nausea, diarrhea, headache, blurry vision  Past Medical History-  Patient Active Problem List   Diagnosis Date Noted  . Anxiety and depression 04/05/2015    Priority: High  . Psoriasis 02/28/2014    Priority: Medium  . CKD (chronic kidney disease), stage III 02/28/2014    Priority: Medium  . Hyperlipidemia 09/25/2011    Priority: Medium  . Adenocarcinoma of lung ([place]) 08/03/2008    Priority: Medium  . Insomnia 08/03/2008    Priority: Medium  . Osteoarthritis 06/26/2007    Priority: Medium  . Vaginal dryness 09/12/2014    Priority: Low  . Microscopic hematuria 09/12/2014    Priority: Low  . Ear canal mass 09/08/2013    Priority: Low  . Substernal precordial chest pain 09/30/2011    Priority: Low  . Thoracic lymphadenopathy 09/25/2011    Priority: Low  . UTI'S, RECURRENT 08/03/2008    Priority: Low  . OSTEOPENIA 08/03/2008    Priority: Low  . ALLERGIC RHINITIS 06/26/2007    Priority: Low  . GERD 06/26/2007    Priority: Low  . HIATAL HERNIA 06/26/2007    Priority: Low  . Irritable bowel syndrome 06/26/2007    Priority: Low  . H. pylori infection 02/01/2015  . Lumbar disc herniation 01/05/2015  . Left groin pain 11/29/2014    Medications- reviewed and updated Current Outpatient Prescriptions  Medication Sig Dispense Refill  . benzocaine-resorcinol (VAGISIL) 5-2 % vaginal cream Place 1 application vaginally at bedtime.    . clonazePAM (KLONOPIN) 0.5 MG tablet Take 0.5 mg by mouth 2 (two) times daily as needed for anxiety.    Marland Kitchen FLUoxetine (PROZAC) 20 MG tablet Take 20 mg by mouth daily.    . Multiple Vitamins-Minerals (CENTRUM SILVER PO) Take 1 tablet by mouth daily.    Marland Kitchen acetaminophen (TYLENOL) 650 MG CR tablet Take 650 mg by mouth every 8 (eight) hours as needed for pain.     Marland Kitchen alum & mag hydroxide-simeth (MAALOX/MYLANTA) 200-200-20 MG/5ML suspension Take 30 mLs by mouth every 6 (six) hours as needed for indigestion or heartburn.    . calcium carbonate (TUMS - DOSED IN MG ELEMENTAL CALCIUM) 500 MG chewable tablet Chew 3 tablets by mouth daily as needed for indigestion or heartburn.    . promethazine (PHENERGAN) 25 MG tablet Take 1 tablet (25 mg total) by mouth every 6 (six) hours as needed for nausea or vomiting. (Patient not taking: Reported on 04/05/2015) 30 tablet 0  . traMADol (ULTRAM) 50 MG tablet Take 1 tablet (50 mg total) by mouth every 6 (six) hours as needed for severe pain (may refill monthly). 90 tablet 5  . zolpidem (AMBIEN) 10 MG tablet Take 1 tablet (10 mg total) by mouth at bedtime as needed for sleep. (Patient not taking: Reported on 04/05/2015) 90 tablet 1   No current facility-administered medications for this visit.    Objective: BP 138/70 mmHg  Pulse 67  Temp(Src) 98.8 F (37.1 C)  Wt 138 lb (62.596 kg) Gen: NAD, resting comfortably, no longer appears anxious or ill CV: RRR no murmurs rubs or gallops Lungs: CTAB no crackles, wheeze, rhonchi Abdomen: soft/nontender/nondistended/normal bowel sounds. No rebound or guarding.  Ext: no edema Skin: warm, dry Neuro: grossly normal, moves all extremities  Assessment/Plan:  Anxiety and depression S:Ill since June- depressed over health. Back  surgery June. Soon after dealt with Peptic ulcer. No appetite and had chronic nausea and diarrhea. Nausea and diarrhea 5 weeks in mid September. Treated H. Pylori. Daily phenergan at that time. Diarrhea had improved. CT ruled out mesenteric ischemia. Advised probiotic. Refer to [employer] advised initially with concern SE of antidepressants. Was tolerating ambien to help sleep- 5 hours to all night with this. Eventually had to go to emergency room with worsening of symptoms of depression. Didn't want to live but did not want to kill herself. Was started on  prozac and Clonazepam 1-2x a day. Saw Dr. France Ravens of psychiatry with follow up planned on January 5th. Since being on medicine, nausea has resolved completely. Takes clonazepam each AM but not most evenings. Cancelled GI appointment given complete resolution. Ros- no SI, HI.  A/P: physical symptom of nausea may have been manifestation of anxiety. Now much better control. No longer with nausea or diarrhea. Continue on prozac and prn klonopin with close follow up if any worsening- patient aware of symptoms to monitor for. phq9 of 1 at present.    Hyperlipidemia S:Holding meds as no clear indication primary prevention and in addition concern that could contribute to nausea. Nausea has stopped compeltely. Patient worried about her levels and would like to be back on statin likely Lab Results  Component Value Date   CHOL 172 09/05/2014   HDL 51.90 09/05/2014   LDLCALC 56 09/06/2013   LDLDIRECT 97.0 09/05/2014   TRIG 237.0* 09/05/2014   CHOLHDL 3 09/05/2014  A/P: repeat lipids, agreed to restart simvastatin '40mg'$  per patient request as long as no side effects (CBG creeping up, myalgias, muscle fatigue intolerance)   6  Months f/u  Orders Placed This Encounter  Procedures  . Flu Vaccine QUAD 36+ mos IM  . LDL cholesterol, direct        Meds ordered this encounter  Medications  . FLUoxetine (PROZAC) 20 MG tablet    Sig: Take 20 mg by mouth daily.  . clonazePAM (KLONOPIN) 0.5 MG tablet    Sig: Take 0.5 mg by mouth 2 (two) times daily as needed for anxiety.  . traMADol (ULTRAM) 50 MG tablet    Sig: Take 1 tablet (50 mg total) by mouth every 6 (six) hours as needed for severe pain (may refill monthly).    Dispense:  90 tablet    Refill:  5   >50% of 25 minute office visit was spent on counseling ( need to remain on medication for depression, discussing ED utilization and reminding her she did the right thing when she felt so poorly, discussing benefits and risks of statins and lipid  lowering agents in general) and coordination of care

## 2015-04-05 NOTE — Assessment & Plan Note (Addendum)
S:Ill since June- depressed over health. Back surgery June. Soon after dealt with Peptic ulcer. No appetite and had chronic nausea and diarrhea. Nausea and diarrhea 5 weeks in mid September. Treated H. Pylori. Daily phenergan at that time. Diarrhea had improved. CT ruled out mesenteric ischemia. Advised probiotic. Refer to [employer] advised initially with concern SE of antidepressants. Was tolerating ambien to help sleep- 5 hours to all night with this. Eventually had to go to emergency room with worsening of symptoms of depression. Didn't want to live but did not want to kill herself. Was started on prozac and Clonazepam 1-2x a day. Saw Dr. France Ravens of psychiatry with follow up planned on January 5th. Since being on medicine, nausea has resolved completely. Takes clonazepam each AM but not most evenings. Cancelled GI appointment given complete resolution. Ros- no SI, HI.  A/P: physical symptom of nausea may have been manifestation of anxiety. Now much better control. No longer with nausea or diarrhea. Continue on prozac and prn klonopin with close follow up if any worsening- patient aware of symptoms to monitor for. phq9 of 1 at present.

## 2015-04-05 NOTE — Assessment & Plan Note (Signed)
S:Holding meds as no clear indication primary prevention and in addition concern that could contribute to nausea. Nausea has stopped compeltely. Patient worried about her levels and would like to be back on statin likely Lab Results  Component Value Date   CHOL 172 09/05/2014   HDL 51.90 09/05/2014   LDLCALC 56 09/06/2013   LDLDIRECT 97.0 09/05/2014   TRIG 237.0* 09/05/2014   CHOLHDL 3 09/05/2014  A/P: repeat lipids, agreed to restart simvastatin '40mg'$  per patient request as long as no side effects (CBG creeping up, myalgias, muscle fatigue intolerance)

## 2015-04-05 NOTE — Patient Instructions (Addendum)
Flu shot received today.  Check bad cholesterol before you leave. You wanted to restart medicine if numbers are up- this is reasonable and will refill  6 months of tramadol provided  Thrilled depression is doing better. Stay on medicine through psychiatry until they recommend otherwise.   Definitely want to see you at least every 6 months but happy to see you sooner if needed (3 months is fine with me as well)

## 2015-04-06 ENCOUNTER — Telehealth: Payer: Self-pay | Admitting: Family Medicine

## 2015-04-06 NOTE — Telephone Encounter (Signed)
Pt states she and dr [employer] [facility] discussed RX for simvastatin.   Pt states she has rx for that and does not need another. Please do not send to [facility] thanks

## 2015-04-06 NOTE — Telephone Encounter (Signed)
Noted  

## 2015-05-01 ENCOUNTER — Other Ambulatory Visit: Payer: Self-pay | Admitting: Gynecology

## 2015-05-02 ENCOUNTER — Encounter: Payer: Self-pay | Admitting: Women's Health

## 2015-05-02 ENCOUNTER — Ambulatory Visit (INDEPENDENT_AMBULATORY_CARE_PROVIDER_SITE_OTHER): Payer: Medicare Other | Admitting: Women's Health

## 2015-05-02 VITALS — BP 132/80 | Wt 138.0 lb

## 2015-05-02 DIAGNOSIS — N898 Other specified noninflammatory disorders of vagina: Secondary | ICD-10-CM

## 2015-05-02 DIAGNOSIS — R350 Frequency of micturition: Secondary | ICD-10-CM

## 2015-05-02 DIAGNOSIS — N3001 Acute cystitis with hematuria: Secondary | ICD-10-CM

## 2015-05-02 LAB — URINALYSIS W MICROSCOPIC + REFLEX CULTURE
Bilirubin Urine: NEGATIVE
CASTS: NONE SEEN [LPF]
CRYSTALS: NONE SEEN [HPF]
Glucose, UA: NEGATIVE
KETONES UR: NEGATIVE
Nitrite: POSITIVE — AB
PROTEIN: NEGATIVE
Specific Gravity, Urine: 1.01 (ref 1.001–1.035)
YEAST: NONE SEEN [HPF]
pH: 5.5 (ref 5.0–8.0)

## 2015-05-02 LAB — WET PREP FOR TRICH, YEAST, CLUE
Clue Cells Wet Prep HPF POC: NONE SEEN
Trich, Wet Prep: NONE SEEN
WBC, Wet Prep HPF POC: NONE SEEN
YEAST WET PREP: NONE SEEN

## 2015-05-02 MED ORDER — CIPROFLOXACIN HCL 250 MG PO TABS: 250.0000 mg | ORAL_TABLET | Freq: Two times a day (BID) | ORAL | Status: DC

## 2015-05-02 NOTE — Addendum Note (Signed)
Addended by: Burnett Kanaris on: 05/02/2015 03:34 PM   Modules accepted: Orders

## 2015-05-02 NOTE — Progress Notes (Signed)
Patient ID: Krystal Craig, female   DOB: 09-Jul-1932, 81 y.o.   MRN: [id number] Presents with complaint of increased urinary frequency with urgency and vaginal discharge with mild odor for the past few days. Not sexually active. Uses Premarin vaginal cream small amount  weekly. Has had a problematic past 6 months with back surgery, H pylori infection, nausea/ diarrhea, is currently doing better. Has been treated for UTI 3 this past year with Septra. History of recurrent UTIs with hematuria with negative evaluation per nephrologist in the past. Denies abdominal pain or fever. Hysterectomy.  Exam: Appears well, younger than stated years. External genitalia mild atrophy, speculum exam scant white discharge, wet prep negative. Bimanual nontender. No CVAT. UA: +2 blood, positive nitrites, trace leukocytes, 10-20 WBCs, 10-20 rbc's, few bacteria.  UTI  Plan: Cipro 250 by mouth twice a day for 5 days #10. Urine culture pending. UTI prevention discussed

## 2015-05-02 NOTE — Patient Instructions (Signed)

## 2015-05-04 ENCOUNTER — Other Ambulatory Visit: Payer: Self-pay | Admitting: Family Medicine

## 2015-05-04 ENCOUNTER — Ambulatory Visit (INDEPENDENT_AMBULATORY_CARE_PROVIDER_SITE_OTHER): Payer: Medicare Other | Admitting: Family Medicine

## 2015-05-04 DIAGNOSIS — Z23 Encounter for immunization: Secondary | ICD-10-CM

## 2015-05-04 MED ORDER — TRAMADOL HCL 50 MG PO TABS: 50.0000 mg | ORAL_TABLET | Freq: Four times a day (QID) | ORAL | Status: DC | PRN

## 2015-05-05 ENCOUNTER — Other Ambulatory Visit: Payer: Self-pay | Admitting: Women's Health

## 2015-05-05 DIAGNOSIS — N3001 Acute cystitis with hematuria: Secondary | ICD-10-CM

## 2015-05-05 LAB — URINE CULTURE

## 2015-05-05 MED ORDER — FLUCONAZOLE 150 MG PO TABS: 150.0000 mg | ORAL_TABLET | Freq: Once | ORAL | Status: DC

## 2015-05-05 MED ORDER — AMOXICILLIN 500 MG PO CAPS: 500.0000 mg | ORAL_CAPSULE | Freq: Three times a day (TID) | ORAL | Status: DC

## 2015-05-26 ENCOUNTER — Other Ambulatory Visit: Payer: Medicare Other

## 2015-05-26 DIAGNOSIS — N3001 Acute cystitis with hematuria: Secondary | ICD-10-CM

## 2015-05-27 LAB — URINALYSIS W MICROSCOPIC + REFLEX CULTURE
Bacteria, UA: NONE SEEN [HPF]
Bilirubin Urine: NEGATIVE
CASTS: NONE SEEN [LPF]
CRYSTALS: NONE SEEN [HPF]
Glucose, UA: NEGATIVE
KETONES UR: NEGATIVE
Leukocytes, UA: NEGATIVE
NITRITE: NEGATIVE
PH: 6 (ref 5.0–8.0)
Protein, ur: NEGATIVE
SPECIFIC GRAVITY, URINE: 1.013 (ref 1.001–1.035)
WBC UA: NONE SEEN WBC/HPF (ref <=5)
Yeast: NONE SEEN [HPF]

## 2015-05-28 LAB — URINE CULTURE
Colony Count: NO GROWTH
ORGANISM ID, BACTERIA: NO GROWTH

## 2015-07-01 ENCOUNTER — Encounter: Payer: Self-pay | Admitting: Family Medicine

## 2015-07-01 ENCOUNTER — Ambulatory Visit (INDEPENDENT_AMBULATORY_CARE_PROVIDER_SITE_OTHER): Payer: Medicare Other | Admitting: Family Medicine

## 2015-07-01 VITALS — BP 132/72 | HR 78 | Temp 98.2°F | Wt 138.0 lb

## 2015-07-01 DIAGNOSIS — J069 Acute upper respiratory infection, unspecified: Secondary | ICD-10-CM | POA: Insufficient documentation

## 2015-07-01 NOTE — Progress Notes (Signed)
Subjective:    Patient ID: Krystal Craig, female    DOB: 09-Mar-1933, 82 y.o.   MRN: [id number]  HPI Here for uri symptoms   Started Thursday evening with a scratchy throat  Then achey all over but no fever Wiped out  Has a headache -mostly in face/ feels like sinus congestion   Blowing nose- clear so far  Coughing-not a lot , just a little bit   Throat is sore - raw/not severe   Taking a multi symptom med from [facility]  Has acetaminophen, DM, phenylephrine, guifenesin   Patient Active Problem List   Diagnosis Date Noted  . Viral URI 07/01/2015  . Anxiety and depression 04/05/2015  . H. pylori infection 02/01/2015  . Lumbar disc herniation 01/05/2015  . Left groin pain 11/29/2014  . Vaginal dryness 09/12/2014  . Microscopic hematuria 09/12/2014  . Psoriasis 02/28/2014  . CKD (chronic kidney disease), stage III 02/28/2014  . Ear canal mass 09/08/2013  . Substernal precordial chest pain 09/30/2011  . Thoracic lymphadenopathy 09/25/2011  . Hyperlipidemia 09/25/2011  . Adenocarcinoma of lung ([place]) 08/03/2008  . UTI'S, RECURRENT 08/03/2008  . OSTEOPENIA 08/03/2008  . Insomnia 08/03/2008  . ALLERGIC RHINITIS 06/26/2007  . GERD 06/26/2007  . HIATAL HERNIA 06/26/2007  . Irritable bowel syndrome 06/26/2007  . Osteoarthritis 06/26/2007   Past Medical History  Diagnosis Date  . Allergic rhinitis   . History of bronchitis   . Pure hypercholesterolemia   . Hiatal hernia   . GERD (gastroesophageal reflux disease)   . Diverticulosis of colon   . IBS (irritable bowel syndrome)   . Urinary tract infection, site not specified   . Osteoarthritis   . Lumbar back pain   . Osteopenia   . Insomnia   . Complication of anesthesia   . PONV (postoperative nausea and vomiting)   . Chest pain 09/24/2011    normal stress test  . Ulcer   . DIVERTICULOSIS OF COLON 08/03/2008    Qualifier: Diagnosis of  By: Lenna Gilford MD, Deborra Medina   . H/O foot surgery   . Malignant neoplasm of bronchus  and lung, unspecified site 2006   Past Surgical History  Procedure Laterality Date  . Appendectomy  1969  . Abdominal hysterectomy  1969  . Cholecystectomy  1976  . Bilateral shouler operations for impingement syndromes  2001    Dr. Eddie Dibbles  . Left lower lobectomy  11/2004    via VATS and mini-thoractomy for 1.5cm adenoca of lung by Dr. Arlyce Dice  . Urologic sugery with cystocele/rectocele repairs and sling  03/2007    Dr. Terance Hart  . Bilateral cataract surgery  2012    Dr. Talbert Forest  . Turmor ear Right 09/2013  . Appendectomy    . Laparoscopic lysis intestinal adhesions    . Lumbar laminectomy/decompression microdiscectomy Left 01/05/2015    Procedure: L3-4 DECOMPRESSION MICRODISCECTOMY ON LEFT;  Surgeon: Melina Schools, MD;  Location: [facility];  Service: Orthopedics;  Laterality: Left;  left Lumbar 3-4   Social History  Substance Use Topics  . Smoking status: Former Smoker -- 1.00 packs/day for 30 years    Types: Cigarettes    Quit date: 06/17/1984  . Smokeless tobacco: Never Used  . Alcohol Use: No   Family History  Problem Relation Age of Onset  . Colon cancer Neg Hx   . Esophageal cancer Neg Hx   . Pancreatic cancer Neg Hx   . Rectal cancer Neg Hx   . Stomach cancer Neg Hx   .  Breast cancer Sister 72  . Dementia Mother   . Heart attack Mother   . CVA Father   . Stroke Father    Allergies  Allergen Reactions  . Codeine     Chest pain/tightness   Current Outpatient Prescriptions on File Prior to Visit  Medication Sig Dispense Refill  . clonazePAM (KLONOPIN) 0.5 MG tablet Take 0.5 mg by mouth 2 (two) times daily as needed for anxiety.    . simvastatin (ZOCOR) 40 MG tablet Take 1 tablet by mouth daily.  1  . traMADol (ULTRAM) 50 MG tablet Take 1 tablet (50 mg total) by mouth every 6 (six) hours as needed for severe pain (may refill every 3 months). 270 tablet 1  . calcium carbonate (TUMS - DOSED IN MG ELEMENTAL CALCIUM) 500 MG chewable tablet Chew 3 tablets by mouth daily as  needed for indigestion or heartburn. Reported on 07/01/2015     No current facility-administered medications on file prior to visit.    Review of Systems Review of Systems  Constitutional: Negative for fever, appetite change,  and unexpected weight change. pos for fatigue and malaise ENT pos for cong and rhinorrhea and st and pnd  Eyes: Negative for pain and visual disturbance.  Respiratory: Negative for wheeze  and shortness of breath.   Cardiovascular: Negative for cp or palpitations    Gastrointestinal: Negative for nausea, diarrhea and constipation.  Genitourinary: Negative for urgency and frequency.  Skin: Negative for pallor or rash   Neurological: Negative for weakness, light-headedness, numbness and headaches.  Hematological: Negative for adenopathy. Does not bruise/bleed easily.  Psychiatric/Behavioral: Negative for dysphoric mood. The patient is not nervous/anxious.         Objective:   Physical Exam  Constitutional: She appears well-developed and well-nourished. No distress.  Fatigued but well appearing   HENT:  Head: Normocephalic and atraumatic.  Right Ear: External ear normal.  Left Ear: External ear normal.  Mouth/Throat: Oropharynx is clear and moist.  Nares are injected and congested  No sinus tenderness Clear rhinorrhea and post nasal drip   Eyes: Conjunctivae and EOM are normal. Pupils are equal, round, and reactive to light. Right eye exhibits no discharge. Left eye exhibits no discharge.  Neck: Normal range of motion. Neck supple.  Cardiovascular: Normal rate and normal heart sounds.   Pulmonary/Chest: Effort normal and breath sounds normal. No respiratory distress. She has no wheezes. She has no rales. She exhibits no tenderness.  Lymphadenopathy:    She has no cervical adenopathy.  Neurological: She is alert.  Skin: Skin is warm and dry. No rash noted.  Psychiatric: She has a normal mood and affect.          Assessment & Plan:   Problem List  Items Addressed This Visit      Respiratory   Viral URI - Primary    Info given on viral uri Disc symptomatic care - see instructions on AVS  Update if not starting to improve in a week or if worsening   Reassuring exam

## 2015-07-01 NOTE — Assessment & Plan Note (Signed)
Info given on viral uri Disc symptomatic care - see instructions on AVS  Update if not starting to improve in a week or if worsening   Reassuring exam

## 2015-07-01 NOTE — Patient Instructions (Signed)
I think you have a viral cold  Drink lots of fluids and rest  Your multi symptom medicine - as directed/as needed  Breathe steam for congestion  Nasal saline spray also helps congestion   Update if not starting to improve in a week or if worsening

## 2015-07-01 NOTE — Progress Notes (Signed)
Pre visit review using our clinic review tool, if applicable. No additional management support is needed unless otherwise documented below in the visit note. 

## 2015-09-19 ENCOUNTER — Encounter: Payer: Medicare Other | Admitting: Women's Health

## 2015-09-21 ENCOUNTER — Ambulatory Visit (INDEPENDENT_AMBULATORY_CARE_PROVIDER_SITE_OTHER): Payer: Medicare Other | Admitting: Pulmonary Disease

## 2015-09-21 ENCOUNTER — Ambulatory Visit (INDEPENDENT_AMBULATORY_CARE_PROVIDER_SITE_OTHER)
Admission: RE | Admit: 2015-09-21 | Discharge: 2015-09-21 | Disposition: A | Discharge status: Home / Self Care | Payer: Medicare Other | Source: Ambulatory Visit | Attending: Pulmonary Disease | Admitting: Pulmonary Disease

## 2015-09-21 ENCOUNTER — Encounter: Payer: Self-pay | Admitting: Pulmonary Disease

## 2015-09-21 VITALS — BP 144/80 | HR 74 | Temp 98.5°F | Ht 63.5 in | Wt 144.0 lb

## 2015-09-21 DIAGNOSIS — C3492 Malignant neoplasm of unspecified part of left bronchus or lung: Secondary | ICD-10-CM | POA: Diagnosis not present

## 2015-09-21 NOTE — Patient Instructions (Signed)
Krystal Craig-- it was great seeing you again    [name] in there!!!  Today we checked your follow up CXR...    We will contact you w/ the results when available...   Call for any questions or if we can be of service in any way...  Let's plan a follow up visit in 14yr sooner if needed for breathing problems..Marland KitchenMarland Kitchen

## 2015-09-21 NOTE — Progress Notes (Signed)
Subjective:     Patient ID: Krystal Craig, female   DOB: July 01, 1932, 82 y.o.   MRN: [id number]  HPI 82 y/o WF here for a follow up visit... she has multiple medical problems as noted below...  ~  SEE PREV EPIC NOTES FOR OLDER DATA >>    CXR 2/13 showed heart size at upper limit, calcif tort Ao, s/p surg on left w/ hyperinflation, scarring left base, NAD...  LABS 2/13:  FLP- at goals on Simva40 x TG212;  Chems- wnl;  CBC- wnl;  TSH=2.06;  VitD=54;  UA- clear  ~  September 07, 2012:  31moROV & Barb reports that Krystal Mccallumhas diagnosed her w/ Psoriasis on her legs and started MTX rx...    AR, Hx bronchitis> she uses OTC antihist etc; denies recurrent bronchitic infections w/o cough, sput, hemop, SOB, etc...    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    Hx Palpit> On ASA81; she notes rare palpit, self limited- she knows to avoid caffeine etc; no CP, dizziness, syncope,etc...    Hyperlipid> On Simva40;  FLP 2/14 showed TChol 172, TG 213, HDL 44, LDL 92; advised better low fat diet...    HH, GERD> On Prilosec OTC as needed & states she hasn't needed it; denies heartburn, reflux, dysphagia, abd pain, n/v, etc...    Divertics, IBS> on Miralax; she denies abd discomfort, d/c/ blood seen, etc; she will be due for 10y86yr colon 3/15    Hx recurrent UTIs> none since Krystal Craig did her cystocele, rectocele, & sling surg 10/08; followed by Gyn Krystal Craig on Premarin0.625...    DJD, LBP> On Tramadol prn; she manages well, get plenty of exercise, still sees Chiropractor Qmo...    Osteopenia> On Calcium, MVI, VitD; BMDs per gyn- "Krystal Craig said to just watch it"...    Psoriasis> on MTX2.5-3tabs, 3xper wk;  We reviewed prob list, meds, xrays and labs> see below for updates >> she had the 2013 Flu vaccine 11/13...  LABS 2/14:  FLP- ok x TG=213 on Simva40;  Chems- wnl;  CBC- wnl;  TSH=1.63;   VitD=55...  CXR 3/14 showed normal heart size, some scarring at left base chronic, postop changes in left hilum, NAD...  ~  September 08, 2013:  Yearly ROV Krystal Craig some right ear pressure, discomfort, and wonders about an infection; Exam showed a soft tissue mass occluding the EAC & we will refer to ENT for further eval & Rx... She also reports a "burning tongue" & went to 5 doctors, sent to [facility] real answer but she chews gum & this helps... We reviewed the following medical problems during today's office visit >>     AR, Hx bronchitis> she uses OTC antihist etc; denies recurrent bronchitic infections w/o cough, sput, hemop, SOB, etc...    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    Hx Palpit> On ASA81; she notes rare palpit, self limited- she knows to avoid caffeine etc; no CP, dizziness, syncope,etc...    Hyperlipid> On Simva40;  FLP 3/15 showed TChol 150, TG 234, HDL 47, LDL 56; advised better low fat diet...    HH, GERD> On Prilosec OTC as needed; denies heartburn, reflux, dysphagia, abd pain, n/v, etc...    Divertics, IBS> on Miralax; she denies abd  discomfort, d/c/ blood seen, etc; she will be due for 70yr f/u colon 3/15    Hx recurrent UTIs> none since Krystal Craig did her cystocele, rectocele, & sling surg 10/08; followed by Krystal Craig now on Premarin0.625...    DJD, LBP> On Tramadol prn; she manages well, get plenty of exercise, still sees Chiropractor Qmo...    Osteopenia> On Calcium, MVI, VitD; BMDs per gyn- "Krystal Craig said to just watch it"...    Psoriasis> off  MTX & treated w/ creams by Krystal Craig... We reviewed prob list, meds, xrays and labs> see below for updates >> she had the 2014 flu vaccine 10/14...   LABS 3/15:  FLP- ok on Simva40 x TG=234;  Chems- wnl;  CBC- wnl;  TSH=1.93;  VitD=74...   CXR 3/15 showed norm heart size, post op changes on left, clear lungs,  NAD.Marland Kitchen.   ~  September 20, 2014:  [name] a good yr- notes breathing is good & denies cough, sput, hemoptysis, SOB, CP; she remains active & is most limited by back discomfort;  She had Ear surg by Krystal Craig to relieve EAC blockage from a boney growth- fully recovered & improved;  She has been followed for primary care by Krystal Craig at [facility] 3/16 & his note is reviewed;  She also sees Krystal Craig for GYN...    AR, Hx bronchitis> she uses OTC antihist etc; denies recurrent bronchitic infections w/o cough, sput, hemop, SOB, etc... Not requiring breathing meds.    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 We reviewed prob list, meds, xrays and labs> see below for updates >>   CXR 08/2014 showed norm heart size, post op changes on left w/ scarring, otherw clear/ NAD...   LABS 3/16 per DrHunter reviewed> OK x elev TG=237...  ~  September 21, 2015:  54yr ROV & pulmonary follow up visit> Krystal Craig notes that her breathing is stable & doing well; she reports a sl cough on exposure to cleaning sprays but she does well if she wears a mask... She had LBP, diagnosed w/ a herniated disc & had Lumbar Laminectomy & decompression 12/2014 by Krystal Craig; after that she reports +HPylori & "I got down", couldn't cope (husb has dementia) and was [facility]) after ER visit w/ anxiety & depression; placed on Wellbutrin which helped but caused a rash so it was stopped, and she reports back to her baseline 7 doing satis now...  She sees DrHunter for PCP & had an ILI 06/2015- symptomatic treatment & resolved; she had her annual GYN check w/ Krystal Craig 04/2015- treated for cystitis...    AR, Hx bronchitis> she uses OTC antihist etc; denies recurrent bronchitic infections w/o cough, sput, hemop, SOB, etc... Not requiring breathing meds.    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 EXAM shows Afeb, VSS, O2sat=98% on RA;  HEENT- neg, mallampati2;  Chest- clear w/o w/r/r;  Heart- RR w/o m/r/g;  Abd- soft, nontender, wnl;  Ext- neg w/o c/c/e;  Neuro- intact...  CXR 09/21/15>  Norm heart size, atherosclerotic calcif in Ao, s/p left lung surg, clear lungs/ NAD...  IMP/PLAN>>  Krystal Craig had a difficult yr in 2016 w/ LLam, anxiety/depression. Etc;  She is back to baseline now she says & feeling well, no resp complaints;  CXR is clear/  NAD & we will continue to follow up yearly, sooner if needed for any breathing problems...           PROBLEM LIST:    ALLERGIC RHINITIS (ICD-477.9) - she uses OTC Zyrtek Prn> advised sinus regimen w/ Antihist Qam, Nasal saline mist every 1-2H during the day, Mucinex 1-2 Bid w/ fluids, & Flonase Qhs...  Hx of BRONCHITIS (ICD-490) - no recent URI's or bronchitic infections... she denies cough, sputum, hemoptysis, worsening dyspnea,  wheezing, chest pains, snoring, daytime hypersomnolence, etc...   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 underwent LLLobectomy via VATS & minithoracotomy by Krystal Craig 6/06 w/ 1.5cm BRONCHOALVEOLAR CELL CANCER & neg nodes... she saw DrMohammed post-op, no adjuvant therapy recommended... she's been followed regularly by Krystal Craig since surg Q48mow/ CXR, alt w/ CT scans... ~  CXR 3/09 by Krystal Craig- no evid of recurrence, left pleural thickening, otherw neg... ~  CT Chest 9/09 showed post-op changes, precarinal LN w/o change, 391mnodule ant left lung w/o change, scar RML w/o change, thoracic spondy- NAD...Marland Kitchen~  CXR 3/10 showed vol loss on left w/ blunt left angle, NAD. ~  CT Chest 10/10 showed clear lungs w/o nodules or infiltrates, stable precarinal LN. ~  CXR 4/11 revealed post op changes, NAD...Marland KitchenMarland Kitchen  CT Chest 11/11 showed postop changes, NAD...Marland Kitchen~  CXR 2/13 showed heart size at upper limit, calcif  tort Ao, s/p surg on left w/ hyperinflation, scarring left base, NAD...Marland KitchenMarland Kitchen~  CT Angio Chest 4/13 showed neg for PE, bibasilar atx, one prom periaortic node, divertics in colon... ~  CXR 3/14 showed normal heart size, some scarring at left base chronic, postop changes in left hilum, NAD. ~  CXR 3/15 showed norm heart size, post op changes on left, clear lungs, NAD...Marland Kitchen~  CXR 08/2014 showed norm heart size, post op changes on left w/ scarring, otherw clear/ NAD...Marland Kitchen~  CXR 09/21/15 showed norm heart size, atherosclerotic calcif in Ao, s/p left lung surg, clear lungs/ NAD  Hx Chest Pain >> see 4/13 Hosp eval... PALPITATIONS, HX OF (ICD-V12.50) - she takes ASA '81mg'$ /d... notes rare palpit, no CP/ syncope/ dyspnea, edema, etc... ~  NuclearStressTest 12/05 was neg- no scar or ischemia, EF= 70%... ~  EKG 4/13 showed NSR, rate71, wnl, NAD...  HYPERCHOLESTEROLEMIA (ICD-272.0) - on SIMVASTATIN '40mg'$ /d + low fat diet... ~  [place]1/07 showed TChol 181, TG -, HDL 48, LDL 95 ~  FLP 1/09 showed TChol 162, TG 198, HDL 43, LDL 80 ~  FLP 2/10 showed TChol 132, TG 144, HDL 41, LDL 62 ~  FLP 2/11 showed TChol 167, TG 271, HDL 57, LDL 90... rec> better low fat diet. ~  FLP 2/12 showed TChol 173, TG 165, HDL 56, LDL 84 ~  FLP 2/13 on Simva40 showed TChol 184, TG 212, HDL 56, LDL 100  ~  FLP 2/14 on Simva40 showed TChol 172, TG 213, HDL 44, LDL 92  ~  FLP 3/15 on Simva40 showed TChol 150, TG 234, HDL 47, LDL 56   HIATAL HERNIA (ICD-553.3), & GERD (ICD-530.81) - she states she is doing fine and does not require PPI therapy... she had surgery w/ HH repair, vagotomy & pyloroplasty 7/92 by DrColon Flatteryor GOO & pyloric channel stenosis... ~  2/11: she notes using occas Mylanta & advised to use Prilosec Prn.  DIVERTICULOSIS OF COLON (ICD-562.10), & IRRITABLE BOWEL SYNDROME (ICD-564.1) - last colonoscopy 3/05 by DrSamLeB showed tortuous colon, divertics, otherw  neg... ~  3/15: she is due for 10 yr f/u colonoscopy &we will refer to  GI...   Hx of UTI'S, RECURRENT (ICD-599.0) - she had recurrent UTI's leading DrGottsegen to refer her to [facility] for surg w/ cystocele/ rectocele repairs & sling 10/08... ~  She denies recurrent UTIs & is now followed by Krystal Craig...  OSTEOARTHRITIS (ICD-715.90) - she has had trigger fingers and several injections by DrSypher... ~  s/p bilat shoulder operations for impingement syndromes in 2001 by DrPaul... ~  She takes TRAMADOL '50mg'$  prn pain...  BACK PAIN, LUMBAR (ICD-724.2) - she still sees her chiropractor every month for LBP adjustments...  OSTEOPENIA (ICD-733.90) - she states that BMD's are done by DrGottsegen et al and she was prev on Boniva but stopped this in 2010 due to "burning tongue" & ultimately has dental consult at [facility] w/ rec to stop the Bisphos Rx (symptoms did abate somewhat)... she takes Calcium, MVI, VitD, & Krystal Craig has her on Premarin Vag cream weekly... ~  BMD at [facility] [address] 5/08 showed TScores +0.1 in Spine, and -2.0 in left FemNeck (sl worse than 2006) ~  BMD in 8/10 at [facility] showed [place] ?Spine, and -1.9 in FemNeck (sl improved from 2008) ~  Osteopenia followed by GYN & they do her BMDs; she tells me that Krystal Craig said to "just watch it" since she is INTOL to Bisphos rx.  INSOMNIA (ICD-780.52) - prev on Ambien Prn but she indicates that she rests well now w/o need for meds...  Health Maintenance: ~  GYN:  she has a new GYN= Krystal Craig on Premarin cream weekly...   Past Surgical History  Procedure Laterality Date  . Appendectomy  1969  . Abdominal hysterectomy  1969  . Cholecystectomy  1976  . Bilateral shouler operations for impingement syndromes  2001    Dr. Eddie Dibbles  . Left lower lobectomy  11/2004    via VATS and mini-thoractomy for 1.5cm adenoca of lung by Dr. Arlyce Dice  . Urologic sugery with cystocele/rectocele repairs and sling  03/2007    Dr. Terance Hart  . Bilateral cataract surgery  2012    Dr. Talbert Forest  . Turmor ear Right 09/2013  . Appendectomy    . Laparoscopic  lysis intestinal adhesions    . Lumbar laminectomy/decompression microdiscectomy Left 01/05/2015    Procedure: L3-4 DECOMPRESSION MICRODISCECTOMY ON LEFT;  Surgeon: Melina Schools, MD;  Location: [place];  Service: Orthopedics;  Laterality: Left;  left Lumbar 3-4    Outpatient Encounter Prescriptions as of 09/21/2015  Medication Sig  . aspirin 81 MG tablet Take 81 mg by mouth daily.  . calcium carbonate (TUMS - DOSED IN MG ELEMENTAL CALCIUM) 500 MG chewable tablet Chew 3 tablets by mouth daily as needed for indigestion or heartburn. Reported on 07/01/2015  . Calcium Carbonate-Vitamin D (CALCIUM-VITAMIN D) 500-200 MG-UNIT tablet Take 1 tablet by mouth daily.  Marland Kitchen conjugated estrogens (PREMARIN) vaginal cream Place 1 Applicatorful vaginally as needed.  . Multiple Vitamins-Minerals (MULTIVITAMIN WITH MINERALS) tablet Take 1 tablet by mouth daily.  . simvastatin (ZOCOR) 40 MG tablet Take 1 tablet by mouth daily.  . traMADol (ULTRAM) 50 MG tablet Take 1 tablet (50 mg total) by mouth every 6 (six) hours as needed for severe pain (may refill every 3 months).  . [DISCONTINUED] buPROPion (WELLBUTRIN XL) 150 MG 24 hr tablet Take 2 tablets by mouth daily.  . [DISCONTINUED] clonazePAM (KLONOPIN) 0.5 MG tablet Take 0.5 mg by mouth 2 (two) times daily as needed for anxiety.  . [DISCONTINUED] zolpidem (  AMBIEN) 10 MG tablet Take 0.5 tablets by mouth at bedtime and may repeat dose one time if needed.    No facility-administered encounter medications on file as of 09/21/2015.    Allergies  Allergen Reactions  . Codeine     Chest pain/tightness    Current Medications, Allergies, Past Medical History, Past Surgical History, Family History, and Social History were reviewed in [employer] record.     Review of Systems         See HPI - all other systems neg except as noted...  The patient denies anorexia, fever, weight loss, weight gain, vision loss, decreased hearing, hoarseness, chest pain,  syncope, dyspnea on exertion, peripheral edema, prolonged cough, headaches, hemoptysis, abdominal pain, melena, hematochezia, severe indigestion/heartburn, hematuria, incontinence, muscle weakness, suspicious skin lesions, transient blindness, difficulty walking, depression, unusual weight change, abnormal bleeding, enlarged lymph nodes, and angioedema.     Objective:   Physical Exam     WD, WN, 82 y/o WF in NAD... GENERAL:  Alert & oriented; pleasant & cooperative. HEENT:  [place]/AT, EOM-wnl, PERRLA, EACs-clear, TMs-wnl, NOSE-clear, THROAT-clear & wnl. NECK:  Supple w/ fairROM; no JVD; normal carotid impulses w/o bruits; no thyromegaly or nodules palpated; no lymphadenopathy. CHEST:  Clear to P & A; without wheezes/ rales/ or rhonchi; scar from prev surg on left. HEART:  Regular Rhythm; without murmurs/ rubs/ or gallops heard... ABDOMEN:  Soft & nontender; normal bowel sounds; no organomegaly or masses detected. EXT: without deformities, mild arthritic changes; no varicose veins/ venous insuffic/ or edema. NEURO:  CN's intact; motor testing normal; sensory testing normal; gait normal & balance OK. DERM:  No lesions noted; no rash etc...  RADIOLOGY DATA:  Reviewed in the EPIC EMR & discussed w/ the patient...    LABORATORY DATA:  Reviewed in the EPIC EMR & discussed w/ the patient...   Assessment:      AR/ Bronchitis>  She uses OTC antihist etc as needed; she denies intercurrent bronchitic infections, etc...  Hx Lung Cancer>  Hx as above; routine CXR is neg- no evid for recurrent dis...   Medical problems managed by DrHunter>>  Palpit & Hx CP>  She notes intermittent self limited palpit & knows to avoid caffeine, pseudophed, etc; see 4/13 Hosp for CP & neg eval Drcooper... Hyperlipid>  Chol numbers ok on the Simva40; needs better low fat diet for the TGs... GI> HH, GERD, Divertics, IBS>  Hx PUD surg yrs ago & she takes an occas Prilosec prn; denies IBS symptoms & says BMs are normal;  colon 3/15 w/ divertics and hem... Hx recurrent UTIs in the past; no prob since sling surg 2008... DJD, LBP>  Doing satis on Tramadol & Chiropractor adjustments Qmo she says... Osteopenia>  Followed by Krystal Craig...     Plan:     Patient's Medications  New Prescriptions   No medications on file  Previous Medications   ASPIRIN 81 MG TABLET    Take 81 mg by mouth daily.   CALCIUM CARBONATE (TUMS - DOSED IN MG ELEMENTAL CALCIUM) 500 MG CHEWABLE TABLET    Chew 3 tablets by mouth daily as needed for indigestion or heartburn. Reported on 07/01/2015   CALCIUM CARBONATE-VITAMIN D (CALCIUM-VITAMIN D) 500-200 MG-UNIT TABLET    Take 1 tablet by mouth daily.   CONJUGATED ESTROGENS (PREMARIN) VAGINAL CREAM    Place 1 Applicatorful vaginally as needed.   MULTIPLE VITAMINS-MINERALS (MULTIVITAMIN WITH MINERALS) TABLET    Take 1 tablet by mouth daily.   SIMVASTATIN (  ZOCOR) 40 MG TABLET    Take 1 tablet by mouth daily.   TRAMADOL (ULTRAM) 50 MG TABLET    Take 1 tablet (50 mg total) by mouth every 6 (six) hours as needed for severe pain (may refill every 3 months).  Modified Medications   No medications on file  Discontinued Medications   BUPROPION (WELLBUTRIN XL) 150 MG 24 HR TABLET    Take 2 tablets by mouth daily.   CLONAZEPAM (KLONOPIN) 0.5 MG TABLET    Take 0.5 mg by mouth 2 (two) times daily as needed for anxiety.   ZOLPIDEM (AMBIEN) 10 MG TABLET    Take 0.5 tablets by mouth at bedtime and may repeat dose one time if needed.

## 2015-09-25 NOTE — Progress Notes (Signed)
Quick Note:  lmtcb for pt. ______ 

## 2015-09-26 ENCOUNTER — Telehealth: Payer: Self-pay | Admitting: Pulmonary Disease

## 2015-09-26 NOTE — Telephone Encounter (Signed)
Notes Recorded by Collier Salina, RN on 09/25/2015 at 3:07 PM lmtcb for pt. Notes Recorded by Noralee Space, MD on 09/22/2015 at 9:16 AM Please notify patient>  CXR shows norm heart size, atherosclerotic changes in Ao, post op changes on left but clear lungs/ NAD.Marland KitchenMarland Kitchen ------------------------- Spoke with pt, aware of results.  Nothing further needed.

## 2015-09-28 LAB — CBC AND DIFFERENTIAL
HCT: 39 % (ref 36–46)
HEMOGLOBIN: 12.6 g/dL (ref 12.0–16.0)
PLATELETS: 279 10*3/uL (ref 150–399)
WBC: 6 10*3/mL

## 2015-09-28 LAB — LIPID PANEL
CHOLESTEROL: 208 mg/dL — AB (ref 0–200)
HDL: 53 mg/dL (ref 35–70)
LDL Cholesterol: 83 mg/dL
TRIGLYCERIDES: 364 mg/dL — AB (ref 40–160)

## 2015-09-28 LAB — VITAMIN D 25 HYDROXY (VIT D DEFICIENCY, FRACTURES): VIT D 25 HYDROXY: 37.6

## 2015-09-28 LAB — HEMOGLOBIN A1C: Hemoglobin A1C: 6.1

## 2015-09-28 LAB — HEPATIC FUNCTION PANEL
ALT: 36 U/L — AB (ref 7–35)
AST: 34 U/L (ref 13–35)

## 2015-09-28 LAB — BASIC METABOLIC PANEL
BUN: 17 mg/dL (ref 4–21)
CREATININE: 1 mg/dL (ref 0.5–1.1)
Glucose: 93 mg/dL
Potassium: 4.4 mmol/L (ref 3.4–5.3)
Sodium: 139 mmol/L (ref 137–147)

## 2015-09-28 LAB — TSH: TSH: 1.24 u[IU]/mL (ref 0.41–5.90)

## 2015-10-02 ENCOUNTER — Other Ambulatory Visit: Payer: Self-pay | Admitting: Family Medicine

## 2015-12-28 DIAGNOSIS — M180 Bilateral primary osteoarthritis of first carpometacarpal joints: Secondary | ICD-10-CM | POA: Insufficient documentation

## 2015-12-28 HISTORY — DX: Bilateral primary osteoarthritis of first carpometacarpal joints: M18.0

## 2016-01-26 LAB — HEPATIC FUNCTION PANEL: ALT: 25 U/L (ref 7–35)

## 2016-01-26 LAB — BASIC METABOLIC PANEL
BUN: 17 mg/dL (ref 4–21)
CREATININE: 1.1 mg/dL (ref <=1.1)
GLUCOSE: 97 mg/dL
POTASSIUM: 4.4 mmol/L (ref 3.4–5.3)
SODIUM: 139 mmol/L (ref 137–147)

## 2016-01-26 LAB — LIPID PANEL
Cholesterol: 171 mg/dL (ref 0–200)
HDL: 58 mg/dL (ref 35–70)
LDL Cholesterol: 75 mg/dL
TRIGLYCERIDES: 193 mg/dL — AB (ref 40–160)

## 2016-01-28 LAB — HEPATIC FUNCTION PANEL: AST: 26 U/L (ref 13–35)

## 2016-01-28 LAB — HEMOGLOBIN A1C: HEMOGLOBIN A1C: 5.9

## 2016-06-01 IMAGING — CT CT ABD-PELV W/ CM
2 of 5 series · 17 of 46 positions shown, 19 images · IV contrast (Omnipaque 300)
Comparison: PET-CT 11/07/2004

CLINICAL DATA: Chronic nausea and diarrhea for 4 weeks.

EXAM:
CT ABDOMEN AND PELVIS WITH CONTRAST
TECHNIQUE: Multidetector CT imaging of the abdomen and pelvis was performed
using the standard protocol following bolus administration of
intravenous contrast.
CONTRAST:  100mL OMNIPAQUE IOHEXOL 300 MG/ML  SOLN

[Series 2: abd/ pel 5mm · axial · 0.65mm/px · z∈[-470,-64]mm · 14 of 91 slices shown, 16 images]
[im 5/91  soft-tissue]
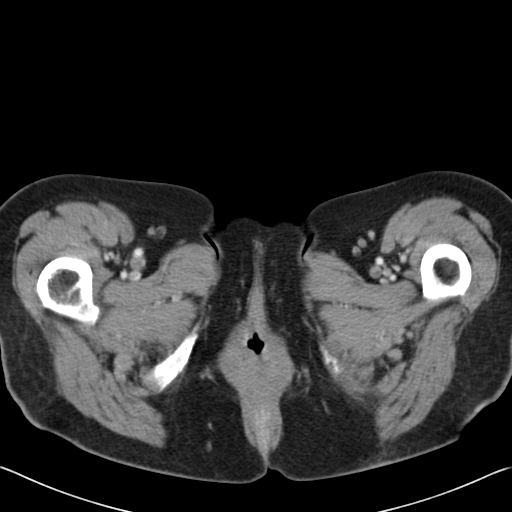
[im 5/91  bone]
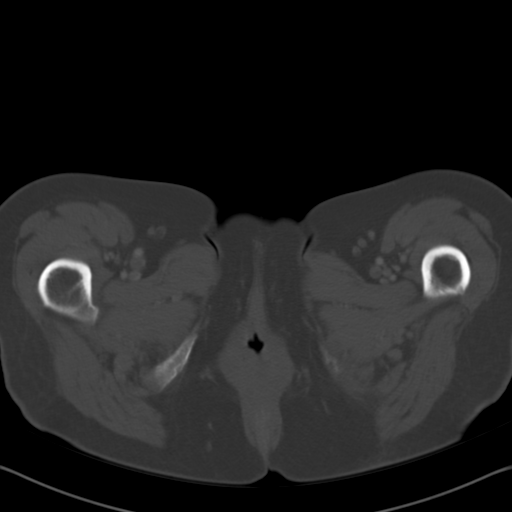
[im 14/91  soft-tissue]
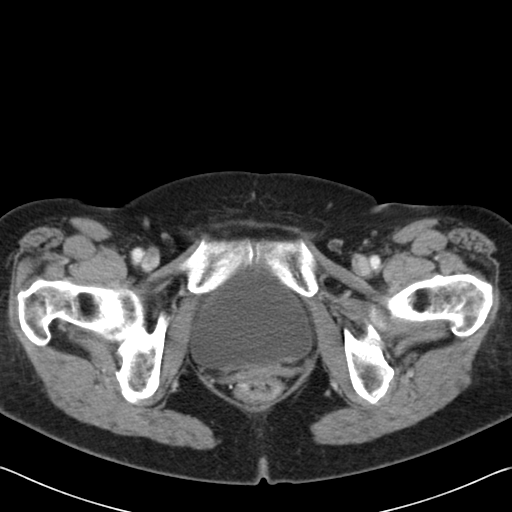
[im 19/91  soft-tissue]
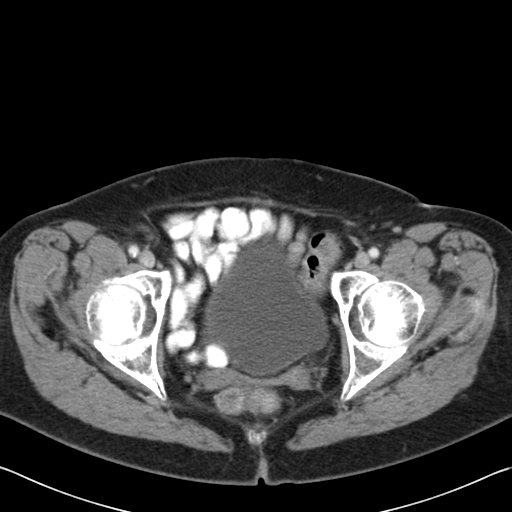
[im 23/91  soft-tissue]
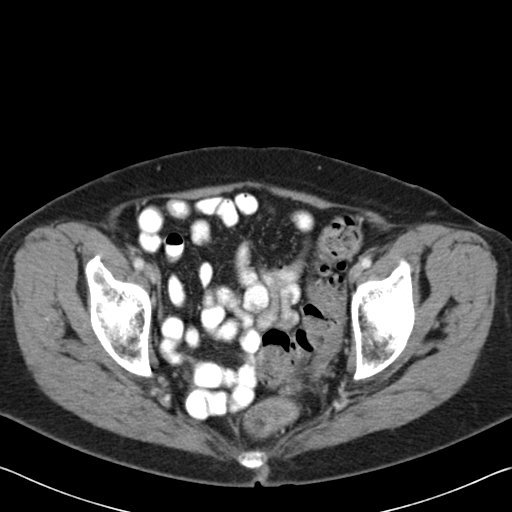
[im 32/91  soft-tissue]
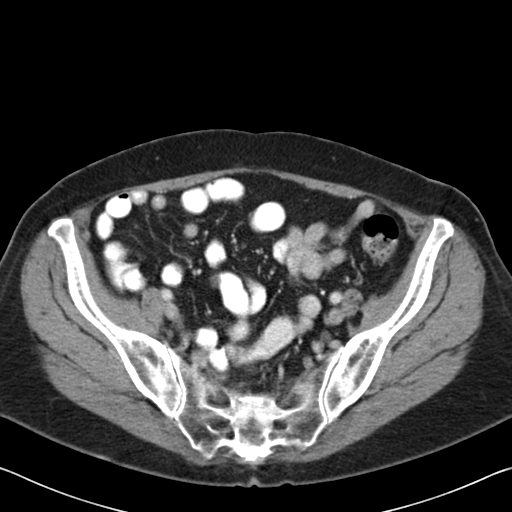
[im 37/91  soft-tissue]
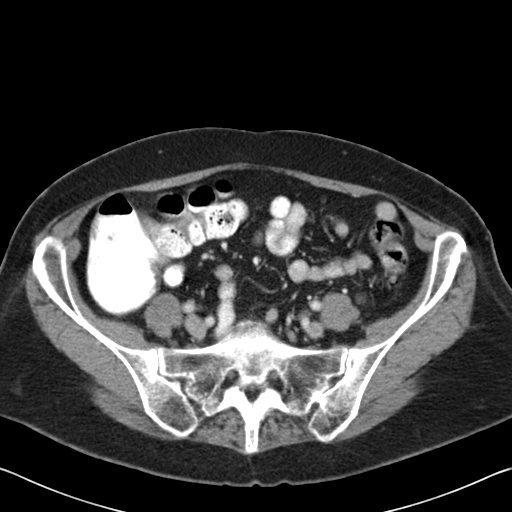
[im 41/91  soft-tissue]
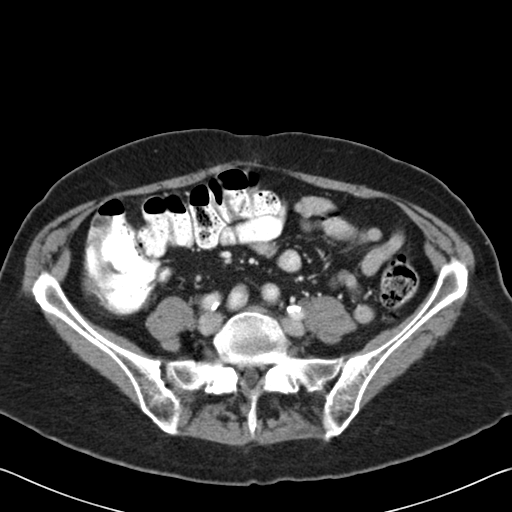
[im 50/91  soft-tissue]
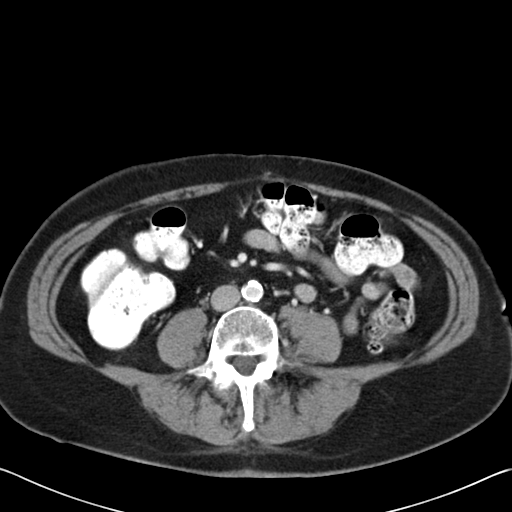
[im 55/91  soft-tissue]
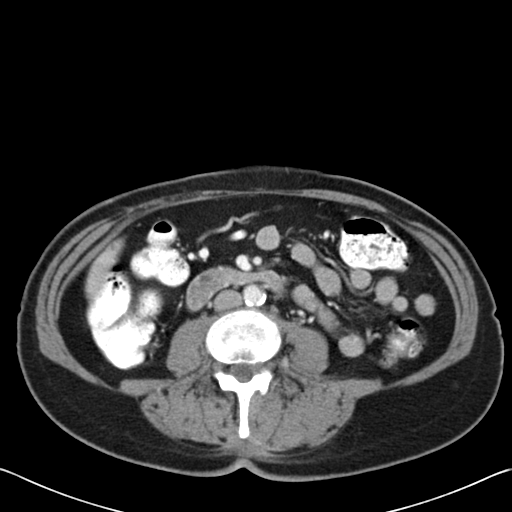
[im 55/91  bone]
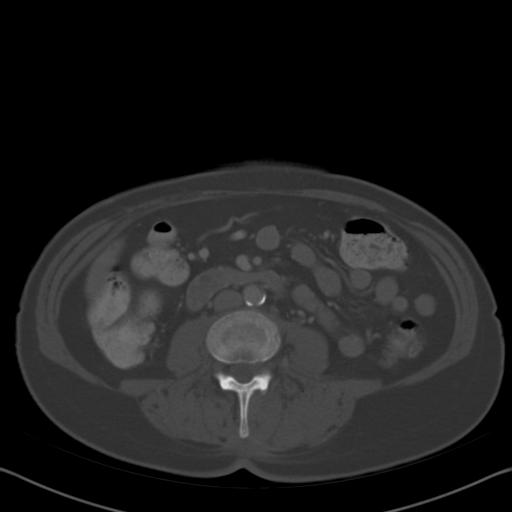
[im 59/91  soft-tissue]
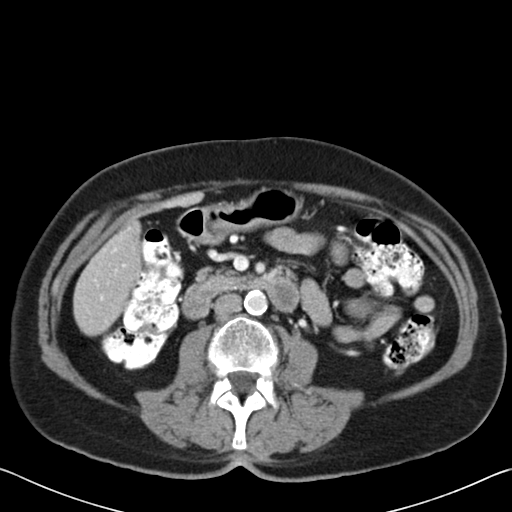
[im 68/91  soft-tissue]
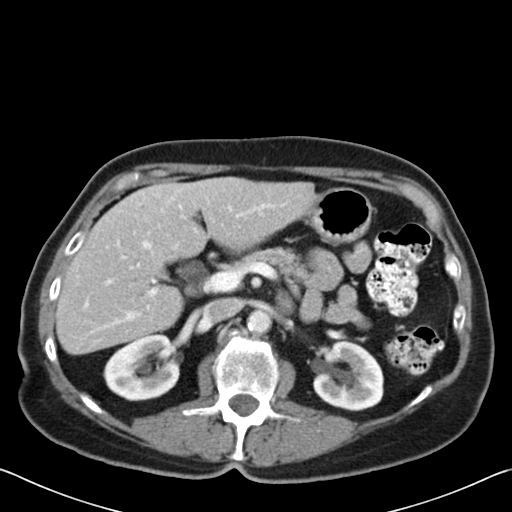
[im 73/91  soft-tissue]
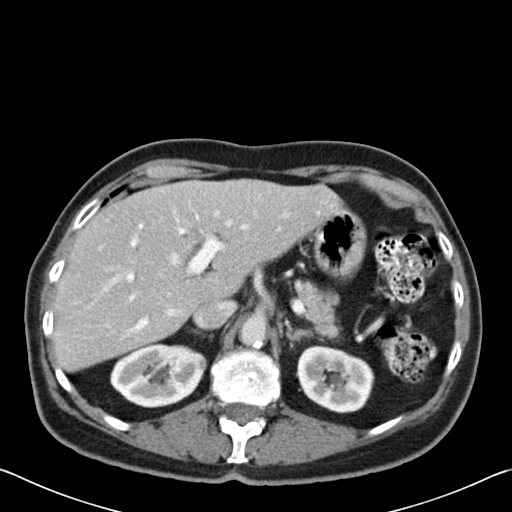
[im 77/91  soft-tissue]
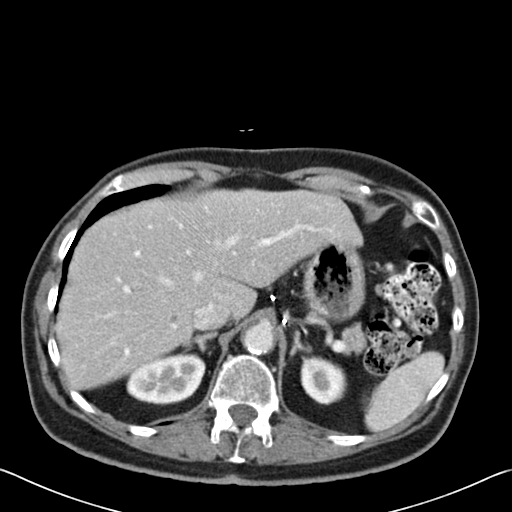
[im 86/91  soft-tissue]
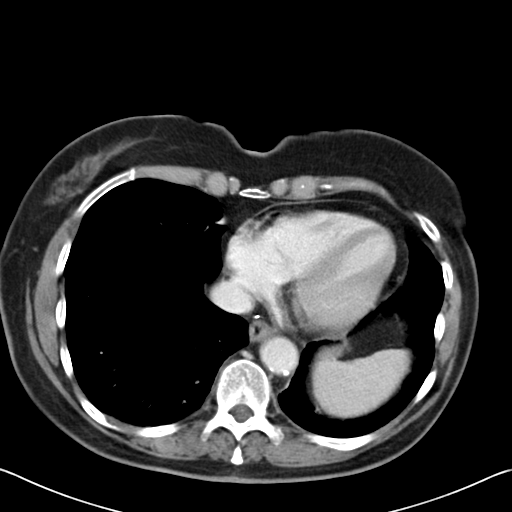

[Series 602: cor · coronal · 0.92mm/px · 3 of 111 slices shown]
[im 37/111  soft-tissue]
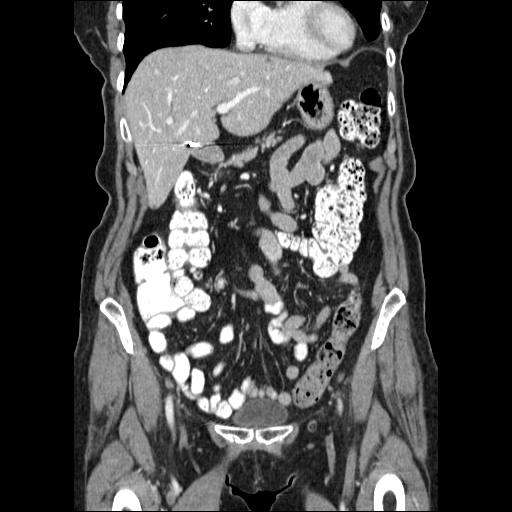
[im 49/111  soft-tissue]
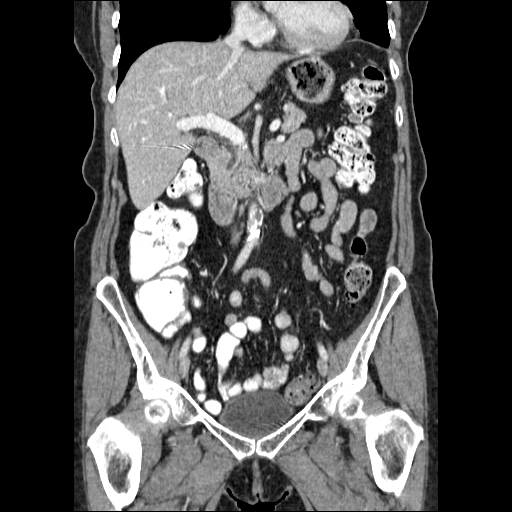
[im 62/111  soft-tissue]
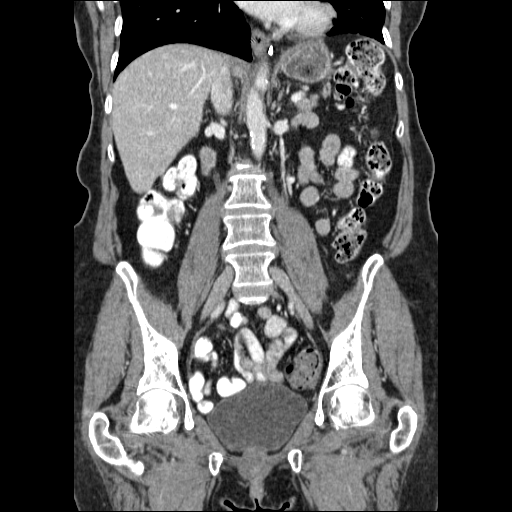

[17 of 46 positions shown; findings below may reference images not displayed]

FINDINGS: Lower chest and abdominal wall:  No contributory findings.

Hepatobiliary: Left liver surface is nodular, raising the
possibility of cirrhosis, but stable from 2774. Multiple sub cm low
densities in the liver are too small to
characterize.Cholecystectomy.

Pancreas: Unremarkable.

Spleen: Unremarkable.

Adrenals/Urinary Tract: Negative adrenals. No hydronephrosis or
stone. Unremarkable bladder.

Reproductive:Hysterectomy with negative adnexae.

Stomach/Bowel: Nearly innumerable subtle nodular densities within
the proximal bowel, duodenum and jejunum. These appear peripheral
and somewhat serpentine and are favored to remote resent dilated
vessels. No portal venous obstructive changes are noted. No
associated wall thickening or inflammatory changes. Appearance not
typical of small bowel metastases given their clustering and
homogeneous size.

Vascular/Lymphatic: No acute vascular abnormality. No mass or
adenopathy.

Peritoneal: No ascites or pneumoperitoneum.

Musculoskeletal: No acute abnormalities. No aggressive lytic or
blastic process.
IMPRESSION: 1. No acute finding.
2. Multiple tiny hypervascular foci within the proximal small bowel
wall, likely prominent vessels. This unusual appearance is of
questionable clinical significance.
3. Nodular liver surface which is stable from 2774. Are there risk
factors for cirrhosis?

## 2016-09-10 ENCOUNTER — Encounter: Payer: Self-pay | Admitting: Women's Health

## 2016-09-10 ENCOUNTER — Ambulatory Visit (INDEPENDENT_AMBULATORY_CARE_PROVIDER_SITE_OTHER): Payer: Medicare Other | Admitting: Women's Health

## 2016-09-10 VITALS — BP 132/80

## 2016-09-10 DIAGNOSIS — N3001 Acute cystitis with hematuria: Secondary | ICD-10-CM

## 2016-09-10 DIAGNOSIS — R300 Dysuria: Secondary | ICD-10-CM | POA: Diagnosis not present

## 2016-09-10 DIAGNOSIS — N898 Other specified noninflammatory disorders of vagina: Secondary | ICD-10-CM | POA: Diagnosis not present

## 2016-09-10 DIAGNOSIS — R110 Nausea: Secondary | ICD-10-CM | POA: Diagnosis not present

## 2016-09-10 LAB — WET PREP FOR TRICH, YEAST, CLUE
CLUE CELLS WET PREP: NONE SEEN
TRICH WET PREP: NONE SEEN
YEAST WET PREP: NONE SEEN

## 2016-09-10 LAB — URINALYSIS W MICROSCOPIC + REFLEX CULTURE
Bilirubin Urine: NEGATIVE
CASTS: NONE SEEN [LPF]
CRYSTALS: NONE SEEN [HPF]
Glucose, UA: NEGATIVE
Ketones, ur: NEGATIVE
NITRITE: NEGATIVE
SPECIFIC GRAVITY, URINE: 1.015 (ref 1.001–1.035)
YEAST: NONE SEEN [HPF]
pH: 6 (ref 5.0–8.0)

## 2016-09-10 MED ORDER — METOCLOPRAMIDE HCL 10 MG PO TABS: 10.0000 mg | ORAL_TABLET | Freq: Three times a day (TID) | ORAL | 0 refills | Status: DC

## 2016-09-10 MED ORDER — NITROFURANTOIN MONOHYD MACRO 100 MG PO CAPS: 100.0000 mg | ORAL_CAPSULE | Freq: Two times a day (BID) | ORAL | 0 refills | Status: AC

## 2016-09-10 MED ORDER — PHENAZOPYRIDINE HCL 200 MG PO TABS: 200.0000 mg | ORAL_TABLET | Freq: Three times a day (TID) | ORAL | 0 refills | Status: DC | PRN

## 2016-09-10 NOTE — Progress Notes (Signed)
Presents with complaint of nausea, no vomiting, burning and itching with urination for 2 weeks. Was seen at primary care was prescribed Septra 3/13 finished out prescription without relief of UTI and was then given Cipro which she was not able to tolerate and only took for 2 days. Had a reaction of arm and leg numbness after Cipro . History of lung cancer, hysterectomy, IBS, GERD, diverticulosis. Cares for her husband with early dementia/Alzheimer's. Concerned regarding nausea, difficulty taking medication and inability to help care for her husband,  became tearful at times.  Exam: Appears worried  External genitalia: normal, dry, red at,introitus. Wet prep negative. UA +2 leukocytes, +3 blood positive for many bacteria, packed WBC, and RBC and moderate mucus.   Unresolved UTI  Nausea  Plan: Nitrofurantoin 100 mg BID for 7 days instructed to take with food,  Reglan 10 mg TID E for meals and at bedtime, Pyridium 200 mg TID as needed. Drink plenty of water, take treatment as prescribed. Instructed to call if no improvement. Urine sent for culture and sensitivity.

## 2016-09-10 NOTE — Patient Instructions (Signed)

## 2016-09-13 LAB — URINE CULTURE

## 2016-09-15 ENCOUNTER — Emergency Department (HOSPITAL_COMMUNITY)
Admission: EM | Admit: 2016-09-15 | Discharge: 2016-09-15 | Disposition: A | Discharge status: Home / Self Care | Payer: Medicare Other | Attending: Emergency Medicine | Admitting: Emergency Medicine

## 2016-09-15 ENCOUNTER — Telehealth: Payer: Self-pay | Admitting: Gynecology

## 2016-09-15 ENCOUNTER — Encounter (HOSPITAL_COMMUNITY): Payer: Self-pay

## 2016-09-15 DIAGNOSIS — Z7982 Long term (current) use of aspirin: Secondary | ICD-10-CM | POA: Insufficient documentation

## 2016-09-15 DIAGNOSIS — Z87891 Personal history of nicotine dependence: Secondary | ICD-10-CM | POA: Insufficient documentation

## 2016-09-15 DIAGNOSIS — E876 Hypokalemia: Secondary | ICD-10-CM

## 2016-09-15 DIAGNOSIS — R300 Dysuria: Secondary | ICD-10-CM | POA: Diagnosis not present

## 2016-09-15 DIAGNOSIS — N183 Chronic kidney disease, stage 3 (moderate): Secondary | ICD-10-CM | POA: Diagnosis not present

## 2016-09-15 LAB — URINALYSIS, ROUTINE W REFLEX MICROSCOPIC
Bilirubin Urine: NEGATIVE
Glucose, UA: NEGATIVE mg/dL
Ketones, ur: 5 mg/dL — AB
Leukocytes, UA: NEGATIVE
Nitrite: NEGATIVE
Protein, ur: 30 mg/dL — AB
SPECIFIC GRAVITY, URINE: 1.006 (ref 1.005–1.030)
pH: 5 (ref 5.0–8.0)

## 2016-09-15 LAB — CBC WITH DIFFERENTIAL/PLATELET
BASOS ABS: 0 10*3/uL (ref 0.0–0.1)
BASOS PCT: 0 %
Eosinophils Absolute: 0 10*3/uL (ref 0.0–0.7)
Eosinophils Relative: 1 %
HEMATOCRIT: 34.6 % — AB (ref 36.0–46.0)
HEMOGLOBIN: 11.7 g/dL — AB (ref 12.0–15.0)
Lymphocytes Relative: 15 %
Lymphs Abs: 0.9 10*3/uL (ref 0.7–4.0)
MCH: 29.9 pg (ref 26.0–34.0)
MCHC: 33.8 g/dL (ref 30.0–36.0)
MCV: 88.5 fL (ref 78.0–100.0)
Monocytes Absolute: 0.5 10*3/uL (ref 0.1–1.0)
Monocytes Relative: 8 %
NEUTROS ABS: 4.8 10*3/uL (ref 1.7–7.7)
Neutrophils Relative %: 76 %
Platelets: 242 10*3/uL (ref 150–400)
RBC: 3.91 MIL/uL (ref 3.87–5.11)
RDW: 13.6 % (ref 11.5–15.5)
WBC: 6.2 10*3/uL (ref 4.0–10.5)

## 2016-09-15 LAB — BASIC METABOLIC PANEL
ANION GAP: 8 (ref 5–15)
BUN: 11 mg/dL (ref 6–20)
CALCIUM: 9.2 mg/dL (ref 8.9–10.3)
CHLORIDE: 103 mmol/L (ref 101–111)
CO2: 25 mmol/L (ref 22–32)
Creatinine, Ser: 0.93 mg/dL (ref 0.44–1.00)
GFR calc non Af Amer: 56 mL/min — ABNORMAL LOW (ref >60)
Glucose, Bld: 100 mg/dL — ABNORMAL HIGH (ref 65–99)
Potassium: 3.2 mmol/L — ABNORMAL LOW (ref 3.5–5.1)
Sodium: 136 mmol/L (ref 135–145)

## 2016-09-15 MED ORDER — POTASSIUM CHLORIDE CRYS ER 20 MEQ PO TBCR
40.0000 meq | EXTENDED_RELEASE_TABLET | Freq: Once | ORAL | Status: DC
  Filled 2016-09-15: qty 2

## 2016-09-15 NOTE — ED Triage Notes (Signed)
Patient has been to her OB/GYN and PCp. Patient has been on 2 antibiotics Patient was also on Cipro, but was unable to tolerate. Dr. Toney Rakes sent patient to the ED to rule out pyelonephritis.

## 2016-09-15 NOTE — ED Notes (Signed)
Pt left without notifying staff. Pt did not gt dc vitals or dc paperwork.

## 2016-09-15 NOTE — ED Notes (Signed)
Pt from home with complaints of frequent lower abdominal pain. Pt denies fever. Pt here from PCP for pain control (but is currently painless) and alteration in antibiotic treatment. Pt has no nausea and no emesis

## 2016-09-15 NOTE — ED Notes (Signed)
Pt ambulated to bathroom to provide urine sample

## 2016-09-15 NOTE — ED Provider Notes (Signed)
[facility] DEPT Provider Note   CSN: [id number] Arrival date & time: 09/15/16  1054     History   Chief Complaint Chief Complaint  Patient presents with  . UTI symptoms    HPI Krystal Craig is a 83 y.o. female.  83 year old female who's had a four-week history of UTI. Has been seen by her Dr. for the symptoms and had been placed on Cipro as well as Macrobid. Continues to complain of mild suprapubic pressure without fever, chills, flank pain. No vomiting or diarrhea. No vaginal bleeding or discharge. Called her physician today, Dr. Toney Rakes, his note in the chart was reviewed today and patient sent here for evaluation of possible polynephritis      Past Medical History:  Diagnosis Date  . Allergic rhinitis   . Chest pain 09/24/2011   normal stress test  . Complication of anesthesia   . Diverticulosis of colon   . DIVERTICULOSIS OF COLON 08/03/2008   Qualifier: Diagnosis of  By: Lenna Gilford MD, Deborra Medina   . GERD (gastroesophageal reflux disease)   . H/O foot surgery   . Hiatal hernia   . History of bronchitis   . IBS (irritable bowel syndrome)   . Insomnia   . Lumbar back pain   . Malignant neoplasm of bronchus and lung, unspecified site 2006  . Osteoarthritis   . Osteopenia   . PONV (postoperative nausea and vomiting)   . Pure hypercholesterolemia   . Ulcer ([place])   . Urinary tract infection, site not specified     Patient Active Problem List   Diagnosis Date Noted  . Viral URI 07/01/2015  . Anxiety and depression 04/05/2015  . H. pylori infection 02/01/2015  . Lumbar disc herniation 01/05/2015  . Left groin pain 11/29/2014  . Vaginal dryness 09/12/2014  . Microscopic hematuria 09/12/2014  . Psoriasis 02/28/2014  . CKD (chronic kidney disease), stage III 02/28/2014  . Ear canal mass 09/08/2013  . Substernal precordial chest pain 09/30/2011  . Thoracic lymphadenopathy 09/25/2011  . Hyperlipidemia 09/25/2011  . Adenocarcinoma of lung ([place]) 08/03/2008  .  UTI'S, RECURRENT 08/03/2008  . OSTEOPENIA 08/03/2008  . Insomnia 08/03/2008  . ALLERGIC RHINITIS 06/26/2007  . GERD 06/26/2007  . HIATAL HERNIA 06/26/2007  . Irritable bowel syndrome 06/26/2007  . Osteoarthritis 06/26/2007    Past Surgical History:  Procedure Laterality Date  . ABDOMINAL HYSTERECTOMY  1969  . appendectomy  1969  . APPENDECTOMY    . bilateral cataract surgery  2012   Dr. Talbert Forest  . bilateral shouler operations for impingement syndromes  2001   Dr. Eddie Dibbles  . CHOLECYSTECTOMY  1976  . LAPAROSCOPIC LYSIS INTESTINAL ADHESIONS    . left lower lobectomy  11/2004   via VATS and mini-thoractomy for 1.5cm adenoca of lung by Dr. Arlyce Dice  . LUMBAR LAMINECTOMY/DECOMPRESSION MICRODISCECTOMY Left 01/05/2015   Procedure: L3-4 DECOMPRESSION MICRODISCECTOMY ON LEFT;  Surgeon: Melina Schools, MD;  Location: [facility];  Service: Orthopedics;  Laterality: Left;  left Lumbar 3-4  . turmor ear Right 09/2013  . urologic sugery with cystocele/rectocele repairs and sling  03/2007   Dr. Terance Hart    OB History    Krystal Craig Para Term Preterm AB Living   '2 2       2   '$ SAB TAB Ectopic Multiple Live Births                   Home Medications    Prior to Admission medications   Medication Sig Start Date End  Date Taking? Authorizing Provider  aspirin 81 MG tablet Take 81 mg by mouth daily.    Historical Provider, MD  calcium carbonate (TUMS - DOSED IN MG ELEMENTAL CALCIUM) 500 MG chewable tablet Chew 3 tablets by mouth daily as needed for indigestion or heartburn. Reported on 07/01/2015    Historical Provider, MD  Calcium Carbonate-Vitamin D (CALCIUM-VITAMIN D) 500-200 MG-UNIT tablet Take 1 tablet by mouth daily.    Historical Provider, MD  conjugated estrogens (PREMARIN) vaginal cream Place 1 Applicatorful vaginally as needed.    Historical Provider, MD  metoCLOPramide (REGLAN) 10 MG tablet Take 1 tablet (10 mg total) by mouth 4 (four) times daily -  before meals and at bedtime. 09/10/16   Huel Cote,  NP  Multiple Vitamins-Minerals (MULTIVITAMIN WITH MINERALS) tablet Take 1 tablet by mouth daily.    Historical Provider, MD  nitrofurantoin, macrocrystal-monohydrate, (MACROBID) 100 MG capsule Take 1 capsule (100 mg total) by mouth 2 (two) times daily. 09/10/16 09/17/16  Huel Cote, NP  phenazopyridine (PYRIDIUM) 200 MG tablet Take 1 tablet (200 mg total) by mouth 3 (three) times daily as needed for pain. 09/10/16   Huel Cote, NP  simvastatin (ZOCOR) 40 MG tablet TAKE ONE TABLET BY MOUTH AT BEDTIME 10/02/15   Marin Olp, MD  traMADol (ULTRAM) 50 MG tablet Take 1 tablet (50 mg total) by mouth every 6 (six) hours as needed for severe pain (may refill every 3 months). 05/04/15   Marin Olp, MD  Zolpidem Tartrate 10 MG SUBL Place under the tongue.    Historical Provider, MD    Family History Family History  Problem Relation Craig of Onset  . Breast cancer Sister 87  . Dementia Mother   . Heart attack Mother   . CVA Father   . Stroke Father   . Colon cancer Neg Hx   . Esophageal cancer Neg Hx   . Pancreatic cancer Neg Hx   . Rectal cancer Neg Hx   . Stomach cancer Neg Hx     Social History Social History  Substance Use Topics  . Smoking status: Former Smoker    Packs/day: 1.00    Years: 30.00    Types: Cigarettes    Quit date: 06/17/1984  . Smokeless tobacco: Never Used  . Alcohol use No     Allergies   Ciprocin-fluocin-procin [fluocinolone] and Codeine   Review of Systems Review of Systems  All other systems reviewed and are negative.    Physical Exam Updated Vital Signs BP (!) 145/80   Pulse 76   Temp 98.4 F (36.9 C)   Resp 16   Ht 5' 3.5" (1.613 m)   Wt 62.6 kg   SpO2 95%   BMI 24.06 kg/m   Physical Exam  Constitutional: She is oriented to person, place, and time. She appears well-developed and well-nourished.  Non-toxic appearance. No distress.  HENT:  Head: Normocephalic and atraumatic.  Eyes: Conjunctivae, EOM and lids are normal. Pupils are  equal, round, and reactive to light.  Neck: Normal range of motion. Neck supple. No tracheal deviation present. No thyroid mass present.  Cardiovascular: Normal rate, regular rhythm and normal heart sounds.  Exam reveals no gallop.   No murmur heard. Pulmonary/Chest: Effort normal and breath sounds normal. No stridor. No respiratory distress. She has no decreased breath sounds. She has no wheezes. She has no rhonchi. She has no rales.  Abdominal: Soft. Normal appearance and bowel sounds are normal. She exhibits no distension. There  is no tenderness. There is no rebound and no CVA tenderness.  Musculoskeletal: Normal range of motion. She exhibits no edema or tenderness.  Neurological: She is alert and oriented to person, place, and time. She has normal strength. No cranial nerve deficit or sensory deficit. GCS eye subscore is 4. GCS verbal subscore is 5. GCS motor subscore is 6.  Skin: Skin is warm and dry. No abrasion and no rash noted.  Psychiatric: She has a normal mood and affect. Her speech is normal and behavior is normal.  Nursing note and vitals reviewed.    ED Treatments / Results  Labs (all labs ordered are listed, but only abnormal results are displayed) Labs Reviewed  URINE CULTURE  URINALYSIS, ROUTINE W REFLEX MICROSCOPIC  CBC WITH DIFFERENTIAL/PLATELET  BASIC METABOLIC PANEL    EKG  EKG Interpretation None       Radiology No results found.  Procedures Procedures (including critical care time)  Medications Ordered in ED Medications - No data to display   Initial Impression / Assessment and Plan / ED Course  I have reviewed the triage vital signs and the nursing notes.  Pertinent labs & imaging results that were available during my care of the patient were reviewed by me and considered in my medical decision making (see chart for details).     Patient's potassium replenished here. Urinalysis shows no signs of infection. She is afebrile and she has no  leukocytosis and a CBC. I have instructed her to continue taking her antibiotics and to follow-up with her gynecologist  Final Clinical Impressions(s) / ED Diagnoses   Final diagnoses:  None    New Prescriptions New Prescriptions   No medications on file     Lacretia Leigh, MD 09/15/16 1456

## 2016-09-15 NOTE — Telephone Encounter (Signed)
Patient called Sundays with concerns about persistent urinary frequency and burning and pelvic pressure.She was seen in office on 03/27 and based on u/a had been started on Macrobid and Pyridium and today is day 6 with minimal relie. On March  13 her PCP had started her on Septra which she completed without relief and then had been given Cipro which she discontinued after side effects on second day and then she came to our office on March 27. Urine culture not available. Patient with history of diverticulosis and due to her age and several antibiotics tried I have recommended she go to [facility] ED today to rule out pyelonephrtis and possible IV antibiotic. I have alerted the Triage Nurse their. Patient understood instruction and will follow.

## 2016-09-15 NOTE — Discharge Instructions (Signed)
Follow-up with Dr. Toney Rakes next week

## 2016-09-17 LAB — URINE CULTURE: Culture: 50000 — AB

## 2016-09-18 ENCOUNTER — Telehealth: Payer: Self-pay | Admitting: Emergency Medicine

## 2016-09-18 NOTE — Telephone Encounter (Addendum)
Post ED Visit - Positive Culture Follow-up  Culture report reviewed by antimicrobial stewardship pharmacist:  '[]'$  Elenor Quinones, Pharm.D. '[]'$  Heide Guile, Pharm.D., BCPS AQ-ID '[]'$  Parks Neptune, Pharm.D., BCPS '[]'$  Alycia Rossetti, Pharm.D., BCPS '[]'$  [place], Pharm.D., BCPS, AAHIVP '[]'$  Legrand Como, Pharm.D., BCPS, AAHIVP '[]'$  Salome Arnt, PharmD, BCPS '[]'$  Dimitri Ped, PharmD, BCPS '[]'$  Vincenza Hews, PharmD, BCPS Maylon Cos PharmD  Positive urine culture Treated with ciprofloxacin x 2 days, to followup with GYN, no further patient follow-up is required at this time.  Hazle Nordmann 09/18/2016, 1:08 PM

## 2016-09-23 ENCOUNTER — Ambulatory Visit (INDEPENDENT_AMBULATORY_CARE_PROVIDER_SITE_OTHER): Payer: Medicare Other | Admitting: Pulmonary Disease

## 2016-09-23 ENCOUNTER — Ambulatory Visit (INDEPENDENT_AMBULATORY_CARE_PROVIDER_SITE_OTHER)
Admission: RE | Admit: 2016-09-23 | Discharge: 2016-09-23 | Disposition: A | Discharge status: Home / Self Care | Payer: Medicare Other | Source: Ambulatory Visit | Attending: Pulmonary Disease | Admitting: Pulmonary Disease

## 2016-09-23 ENCOUNTER — Encounter: Payer: Self-pay | Admitting: Pulmonary Disease

## 2016-09-23 VITALS — BP 140/74 | HR 62 | Temp 97.7°F | Ht 63.5 in | Wt 135.1 lb

## 2016-09-23 DIAGNOSIS — C3492 Malignant neoplasm of unspecified part of left bronchus or lung: Secondary | ICD-10-CM

## 2016-09-23 DIAGNOSIS — K219 Gastro-esophageal reflux disease without esophagitis: Secondary | ICD-10-CM

## 2016-09-23 DIAGNOSIS — J301 Allergic rhinitis due to pollen: Secondary | ICD-10-CM

## 2016-09-23 DIAGNOSIS — K440 Diaphragmatic hernia with obstruction, without gangrene: Secondary | ICD-10-CM

## 2016-09-23 DIAGNOSIS — J40 Bronchitis, not specified as acute or chronic: Secondary | ICD-10-CM | POA: Diagnosis not present

## 2016-09-23 NOTE — Progress Notes (Signed)
Subjective:     Patient ID: Krystal Craig, female   DOB: [birth year], 83 y.o.   MRN: [id number]  HPI 83 y/o WF here for a follow up visit... she has multiple medical problems as noted below...  ~  SEE PREV EPIC NOTES FOR OLDER DATA >>    CXR 2/13 showed heart size at upper limit, calcif tort Ao, s/p surg on left w/ hyperinflation, scarring left base, NAD...  LABS 2/13:  FLP- at goals on Simva40 x TG212;  Chems- wnl;  CBC- wnl;  TSH=2.06;  VitD=54;  UA- clear  ~  September 07, 2012:  2moROV & Krystal Craig reports that Krystal Mccallumhas diagnosed her w/ Psoriasis on her legs and started MTX rx...    AR, Hx bronchitis> she uses OTC antihist etc; denies recurrent bronchitic infections w/o cough, sput, hemop, SOB, etc...    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    Hx Palpit> On ASA81; she notes rare palpit, self limited- she knows to avoid caffeine etc; no CP, dizziness, syncope,etc...    Hyperlipid> On Simva40;  FLP 2/14 showed TChol 172, TG 213, HDL 44, LDL 92; advised better low fat diet...    HH, GERD> On Prilosec OTC as needed & states she hasn't needed it; denies heartburn, reflux, dysphagia, abd pain, n/v, etc...    Divertics, IBS> on Miralax; she denies abd discomfort, d/c/ blood seen, etc; she will be due for 10y11yr colon 3/15    Hx recurrent UTIs> none since Krystal Craig did her cystocele, rectocele, & sling surg 10/08; followed by Gyn Krystal Craig on Premarin0.625...    DJD, LBP> On Tramadol prn; she manages well, get plenty of exercise, still sees Chiropractor Qmo...    Osteopenia> On Calcium, MVI, VitD; BMDs per gyn- "Krystal Craig said to just watch it"...    Psoriasis> on MTX2.5-3tabs, 3xper wk;  We reviewed prob list, meds, xrays and labs> see below for updates >> she had the 2013 Flu vaccine 11/13...  LABS 2/14:  FLP- ok x TG=213 on Simva40;  Chems- wnl;  CBC- wnl;  TSH=1.63;   VitD=55...  CXR 3/14 showed normal heart size, some scarring at left base chronic, postop changes in left hilum, NAD...  ~  September 08, 2013:  Yearly ROV [name] right ear pressure, discomfort, and wonders about an infection; Exam showed a soft tissue mass occluding the EAC & we will refer to ENT for further eval & Rx... She also reports a "burning tongue" & went to 5 doctors, sent to [facility] real answer but she chews gum & this helps... We reviewed the following medical problems during today's office visit >>     AR, Hx bronchitis> she uses OTC antihist etc; denies recurrent bronchitic infections w/o cough, sput, hemop, SOB, etc...    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    Hx Palpit> On ASA81; she notes rare palpit, self limited- she knows to avoid caffeine etc; no CP, dizziness, syncope,etc...    Hyperlipid> On Simva40;  FLP 3/15 showed TChol 150, TG 234, HDL 47, LDL 56; advised better low fat diet...    HH, GERD> On Prilosec OTC as needed; denies heartburn, reflux, dysphagia, abd pain, n/v, etc...    Divertics, IBS> on Miralax; she denies abd  discomfort, d/c/ blood seen, etc; she will be due for 70yr f/u colon 3/15    Hx recurrent UTIs> none since Krystal Craig did her cystocele, rectocele, & sling surg 10/08; followed by Krystal Craig now on Premarin0.625...    DJD, LBP> On Tramadol prn; she manages well, get plenty of exercise, still sees Chiropractor Qmo...    Osteopenia> On Calcium, MVI, VitD; BMDs per gyn- "Krystal Craig said to just watch it"...    Psoriasis> off  MTX & treated w/ creams by Krystal Craig... We reviewed prob list, meds, xrays and labs> see below for updates >> she had the 2014 flu vaccine 10/14...   LABS 3/15:  FLP- ok on Simva40 x TG=234;  Chems- wnl;  CBC- wnl;  TSH=1.93;  VitD=74...   CXR 3/15 showed norm heart size, post op changes on left, clear lungs,  NAD.Marland Kitchen.   ~  September 20, 2014:  [name] a good yr- notes breathing is good & denies cough, sput, hemoptysis, SOB, CP; she remains active & is most limited by back discomfort;  She had Ear surg by Krystal Craig to relieve EAC blockage from a boney growth- fully recovered & improved;  She has been followed for primary care by Krystal Craig at [facility] & his note is reviewed;  She also sees Krystal Craig for GYN...    AR, Hx bronchitis> she uses OTC antihist etc; denies recurrent bronchitic infections w/o cough, sput, hemop, SOB, etc... Not requiring breathing meds.    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 We reviewed prob list, meds, xrays and labs> see below for updates >>   CXR 08/2014 showed norm heart size, post op changes on left w/ scarring, otherw clear/ NAD...   LABS Craig per Krystal Craig reviewed> OK x elev TG=237...  ~  September 21, 2015:  54yr ROV & pulmonary follow up visit> Krystal Craig notes that her breathing is stable & doing well; she reports a sl cough on exposure to cleaning sprays but she does well if she wears a mask... She had LBP, diagnosed w/ a herniated disc & had Lumbar Laminectomy & decompression 12/2014 by Krystal Craig; after that she reports +HPylori & "I got down", couldn't cope (husb has dementia) and was Hosp [facility]rystal Craig) after ER visit w/ anxiety & depression; placed on Krystal Craig which helped but caused a rash so it was stopped, and she reports back to her baseline 7 doing satis now...  She sees Krystal Craig for PCP & had an ILI 06/2015- symptomatic treatment & resolved; she had her annual GYN check w/ Krystal Craig 04/2015- treated for cystitis...    AR, Hx bronchitis> she uses OTC antihist etc; denies recurrent bronchitic infections w/o cough, sput, hemop, SOB, etc... Not requiring breathing meds.    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 EXAM shows Afeb, VSS, O2sat=98% on RA;  HEENT- neg, mallampati2;  Chest- clear w/o w/r/r;  Heart- RR w/o m/r/g;  Abd- soft, nontender, wnl;  Ext- neg w/o c/c/e;  Neuro- intact...  CXR 09/21/15>  Norm heart size, atherosclerotic calcif in Ao, s/p left lung surg, clear lungs/ NAD...  IMP/PLAN>>  Krystal Craig had a difficult yr in 2016 w/ LLam, anxiety/depression. Etc;  She is back to baseline now she says & feeling well, no resp complaints;  CXR is clear/  NAD & we will continue to follow up yearly, sooner if needed for any breathing problems...   ~  September 23, 2016:  71yrROV & pulmonary recheck> her PCP now is Dr. SJonathon Jordan  She's been recently treated by GYN, the ER, for Cystitis/ UTI... We follow her for hx Lung Cancer- s/p L[place]obectomy  [place]  She reports breathing well- denies cough/ sput/ hemoptysis/ SOB/ CP, etc;  She notes chr stable DOE w/ rushing, stairs, etc; she is ok w/ ADLs but not exercising due to back pain;  She is due for f/u CXR and requests refills of Tramadol, Zolpidem=> OK...  We reviewed the following medical problems during today's office visit>      AR, Hx bronchitis> she uses OTC antihist etc; denies recurrent bronchitic infections w/o cough, sput, hemop, SOB, etc... Not requiring breathing meds.    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 EXAM shows Afeb, VSS, O2sat=98% on RA;  HEENT- neg, mallampati2;  Chest- clear w/o w/r/r;  Heart- RR w/o m/r/g;  Abd- soft, nontender, wnl;  Ext- neg w/o c/c/e;  Neuro- intact...  CXR 09/23/16>  Norm heart size, aortic atherosclerosis, blunt left angle w/o change, surg clips left hilum &atrach region- stable, NAD...  IMP/PLAN>>  Krystal Craig remains stable at 83, no signs of recurrent or metastatic cancer; reminded to stay active, incr  exercise program, etc; we refilled Tramadol & Zolpidem per request...            PROBLEM LIST:    ALLERGIC RHINITIS (ICD-477.9) - she uses OTC Zyrtek Prn> advised sinus regimen w/ Antihist Qam, Nasal saline mist every 1-2H during the day, Mucinex 1-2 Bid w/ fluids, & Flonase Qhs...  Hx of BRONCHITIS (ICD-490) - no recent URI's or bronchitic infections... she denies cough, sputum, hemoptysis, worsening dyspnea,  wheezing, chest pains, snoring, daytime hypersomnolence, etc...   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 underwent LLLobectomy via VATS & minithoracotomy by Krystal Craig 6/06 w/ 1.5cm BRONCHOALVEOLAR CELL CANCER & neg nodes... she saw DrMohammed post-op, no adjuvant therapy recommended... she's been followed regularly by Krystal Craig since surg Q630mo/ CXR, alt w/ CT scans... ~  CXR 3/09 by Krystal Craig- no evid of recurrence, left pleural thickening, otherw neg... ~  CT Chest 9/09 showed post-op changes, precarinal LN w/o change, 38m11module ant left lung w/o change, scar RML w/o change, thoracic spondy- NAD... Marland Kitchen  CXR 3/10 showed vol loss on left w/ blunt left angle, NAD. ~  CT Chest 10/10 showed clear lungs w/o nodules or infiltrates, stable precarinal LN. ~  CXR 4/11 revealed post op changes, NAD... Marland KitchenMarland Kitchen CT Chest 11/11 showed postop changes, NAD... Marland Kitchen  CXR 2/13 showed heart size at upper limit, calcif tort Ao, s/p surg on left w/ hyperinflation, scarring left base, NAD... Marland KitchenMarland Kitchen  CT Angio Chest 4/13 showed neg for PE, bibasilar atx, one prom periaortic node, divertics in colon... ~  CXR 3/14 showed normal heart size, some scarring at left base chronic, postop changes in left hilum, NAD. ~  CXR 3/15 showed norm heart size, post op changes on left, clear lungs, NAD... Marland Kitchen  CXR 08/2014 showed norm heart size, post op changes on left w/ scarring, otherw clear/ NAD... Marland Kitchen  CXR 09/21/15 showed norm heart size, atherosclerotic calcif in Ao, s/p left lung surg, clear  lungs/ NAD  Hx  Chest Pain >> see 4/13 Hosp eval... PALPITATIONS, HX OF (ICD-V12.50) - she takes ASA '81mg'$ /d... notes rare palpit, no CP/ syncope/ dyspnea, edema, etc... ~  NuclearStressTest 12/05 was neg- no scar or ischemia, EF= 70%... ~  EKG 4/13 showed NSR, rate71, wnl, NAD...  HYPERCHOLESTEROLEMIA (ICD-272.0) - on SIMVASTATIN '40mg'$ /d + low fat diet... ~  [place] 11/07 showed TChol 181, TG -, HDL 48, LDL 95 ~  FLP 1/09 showed TChol 162, TG 198, HDL 43, LDL 80 ~  FLP 2/10 showed TChol 132, TG 144, HDL 41, LDL 62 ~  FLP 2/11 showed TChol 167, TG 271, HDL 57, LDL 90... rec> better low fat diet. ~  FLP 2/12 showed TChol 173, TG 165, HDL 56, LDL 84 ~  FLP 2/13 on Simva40 showed TChol 184, TG 212, HDL 56, LDL 100  ~  FLP 2/14 on Simva40 showed TChol 172, TG 213, HDL 44, LDL 92  ~  FLP 3/15 on Simva40 showed TChol 150, TG 234, HDL 47, LDL 56   HIATAL HERNIA (ICD-553.3), & GERD (ICD-530.81) - she states she is doing fine and does not require PPI therapy... 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 ~  2/11: she notes using occas Mylanta & advised to use Prilosec Prn.  DIVERTICULOSIS OF COLON (ICD-562.10), & IRRITABLE BOWEL SYNDROME (ICD-564.1) - last colonoscopy 3/05 by DrSamLeB showed tortuous colon, divertics, otherw neg... ~  3/15: she is due for 10 yr f/u colonoscopy &we will refer to GI...   Hx of UTI'S, RECURRENT (ICD-599.0) - she had recurrent UTI's leading DrGottsegen to refer her to [place] for surg w/ cystocele/ rectocele repairs & sling 10/08... ~  She denies recurrent UTIs & is now followed by Krystal Craig...  OSTEOARTHRITIS (ICD-715.90) - she has had trigger fingers and several injections by DrSypher... ~  s/p bilat shoulder operations for impingement syndromes in 2001 by DrPaul... ~  She takes TRAMADOL '50mg'$  prn pain...  BACK PAIN, LUMBAR (ICD-724.2) - she still sees her chiropractor every month for LBP  adjustments...  OSTEOPENIA (ICD-733.90) - she states that BMD's are done by DrGottsegen et al and she was prev on Boniva but stopped this in 2010 due to "burning tongue" & ultimately has dental consult at [facility] w/ rec to stop the Bisphos Rx (symptoms did abate somewhat)... she takes Calcium, MVI, VitD, & Krystal Craig has her on Premarin Vag cream weekly... ~  BMD at [facility] 5/08 showed TScores +0.1 in Spine, and -2.0 in left FemNeck (sl worse than 2006) ~  BMD in 8/10 at [facility] showed [place] ?Spine, and -1.9 in FemNeck (sl improved from 2008) ~  Osteopenia followed by GYN & they do her BMDs; she tells me that Krystal Craig said to "just watch it" since she is INTOL to Bisphos rx.  INSOMNIA (ICD-780.52) - prev on Ambien Prn but she indicates that she rests well now w/o need for meds...  Health Maintenance: ~  GYN:  she has a new GYN= Krystal Craig on Premarin cream weekly...   Past Surgical History:  Procedure Laterality Date  . ABDOMINAL HYSTERECTOMY  1969  . appendectomy  1969  . APPENDECTOMY    . bilateral cataract surgery  2012   Dr. Talbert Forest  . bilateral shouler operations for impingement syndromes  2001   Dr. Eddie Dibbles  . CHOLECYSTECTOMY  1976  . LAPAROSCOPIC LYSIS INTESTINAL ADHESIONS    . left lower lobectomy  11/2004   via VATS and mini-thoractomy for 1.5cm  adenoca of lung by Dr. Arlyce Dice  . LUMBAR LAMINECTOMY/DECOMPRESSION MICRODISCECTOMY Left 01/05/2015   Procedure: L3-4 DECOMPRESSION MICRODISCECTOMY ON LEFT;  Surgeon: Melina Schools, MD;  Location: [place];  Service: Orthopedics;  Laterality: Left;  left Lumbar 3-4  . turmor ear Right 09/2013  . urologic sugery with cystocele/rectocele repairs and sling  03/2007   Dr. Terance Hart    Outpatient Encounter Prescriptions as of 09/23/2016  Medication Sig  . aspirin 81 MG tablet Take 81 mg by mouth daily.  . calcium carbonate (TUMS - DOSED IN MG ELEMENTAL CALCIUM) 500 MG chewable tablet Chew 3 tablets by mouth daily as needed for indigestion or heartburn. Reported on  07/01/2015  . Calcium Carbonate-Vitamin D (CALCIUM-VITAMIN D) 500-200 MG-UNIT tablet Take 1 tablet by mouth daily.  Marland Kitchen conjugated estrogens (PREMARIN) vaginal cream Place 1 Applicatorful vaginally as needed.  . Multiple Vitamins-Minerals (MULTIVITAMIN WITH MINERALS) tablet Take 1 tablet by mouth daily.  . simvastatin (ZOCOR) 40 MG tablet TAKE ONE TABLET BY MOUTH AT BEDTIME  . traMADol (ULTRAM) 50 MG tablet Take 1 tablet (50 mg total) by mouth every 6 (six) hours as needed for severe pain (may refill every 3 months).  . Zolpidem Tartrate 10 MG SUBL Place under the tongue.  . [DISCONTINUED] metoCLOPramide (REGLAN) 10 MG tablet Take 1 tablet (10 mg total) by mouth 4 (four) times daily -  before meals and at bedtime. (Patient not taking: Reported on 09/23/2016)  . [DISCONTINUED] phenazopyridine (PYRIDIUM) 200 MG tablet Take 1 tablet (200 mg total) by mouth 3 (three) times daily as needed for pain. (Patient not taking: Reported on 09/23/2016)   No facility-administered encounter medications on file as of 09/23/2016.     Allergies  Allergen Reactions  . Ciprocin-Fluocin-Procin [Fluocinolone] Other (See Comments)    Leg and arm numbness  . Codeine     Chest pain/tightness    Current Medications, Allergies, Past Medical History, Past Surgical History, Family History, and Social History were reviewed in [employer] record.     Review of Systems         See HPI - all other systems neg except as noted...  The patient denies anorexia, fever, weight loss, weight gain, vision loss, decreased hearing, hoarseness, chest pain, syncope, dyspnea on exertion, peripheral edema, prolonged cough, headaches, hemoptysis, abdominal pain, melena, hematochezia, severe indigestion/heartburn, hematuria, incontinence, muscle weakness, suspicious skin lesions, transient blindness, difficulty walking, depression, unusual weight change, abnormal bleeding, enlarged lymph nodes, and angioedema.      Objective:   Physical Exam     WD, WN, 83 y/o WF in NAD... GENERAL:  Alert & oriented; pleasant & cooperative. HEENT:  [place]/AT, EOM-wnl, PERRLA, EACs-clear, TMs-wnl, NOSE-clear, THROAT-clear & wnl. NECK:  Supple w/ fairROM; no JVD; normal carotid impulses w/o bruits; no thyromegaly or nodules palpated; no lymphadenopathy. CHEST:  Clear to P & A; without wheezes/ rales/ or rhonchi; scar from prev surg on left. HEART:  Regular Rhythm; without murmurs/ rubs/ or gallops heard... ABDOMEN:  Soft & nontender; normal bowel sounds; no organomegaly or masses detected. EXT: without deformities, mild arthritic changes; no varicose veins/ venous insuffic/ or edema. NEURO:  CN's intact; motor testing normal; sensory testing normal; gait normal & balance OK. DERM:  No lesions noted; no rash etc...  RADIOLOGY DATA:  Reviewed in the EPIC EMR & discussed w/ the patient...    LABORATORY DATA:  Reviewed in the EPIC EMR & discussed w/ the patient...   Assessment:      AR/ Bronchitis>  She uses OTC antihist etc as needed; she denies intercurrent bronchitic infections, etc...  Hx Lung Cancer>  Hx as above; routine CXR is neg- no evid for recurrent dis...   Medical problems managed by [facility]>>  Palpit & Hx CP>  She notes intermittent self limited palpit & knows to avoid caffeine, pseudophed, etc; see 4/13 Hosp for CP & neg eval Drcooper... Hyperlipid>  Chol numbers ok on the Simva40; needs better low fat diet for the TGs... GI> HH, GERD, Divertics, IBS>  Hx PUD surg yrs ago & she takes an occas Prilosec prn; denies IBS symptoms & says BMs are normal; colon 3/15 w/ divertics and hem... Hx recurrent UTIs in the past; no prob since sling surg 2008... DJD, LBP>  Doing satis on Tramadol & Chiropractor adjustments Qmo she says... Osteopenia>  Followed by [facility]...     Plan:     Patient's Medications  New Prescriptions   No medications on file  Previous Medications   ASPIRIN 81 MG TABLET     Take 81 mg by mouth daily.   CALCIUM CARBONATE (TUMS - DOSED IN MG ELEMENTAL CALCIUM) 500 MG CHEWABLE TABLET    Chew 3 tablets by mouth daily as needed for indigestion or heartburn. Reported on 07/01/2015   CALCIUM CARBONATE-VITAMIN D (CALCIUM-VITAMIN D) 500-200 MG-UNIT TABLET    Take 1 tablet by mouth daily.   CONJUGATED ESTROGENS (PREMARIN) VAGINAL CREAM    Place 1 Applicatorful vaginally as needed.   MULTIPLE VITAMINS-MINERALS (MULTIVITAMIN WITH MINERALS) TABLET    Take 1 tablet by mouth daily.   SIMVASTATIN (ZOCOR) 40 MG TABLET    TAKE ONE TABLET BY MOUTH AT BEDTIME   TRAMADOL (ULTRAM) 50 MG TABLET    Take 1 tablet (50 mg total) by mouth every 6 (six) hours as needed for severe pain (may refill every 3 months).   ZOLPIDEM TARTRATE 10 MG SUBL    Place under the tongue.  Modified Medications   No medications on file  Discontinued Medications   METOCLOPRAMIDE (REGLAN) 10 MG TABLET    Take 1 tablet (10 mg total) by mouth 4 (four) times daily -  before meals and at bedtime.   PHENAZOPYRIDINE (PYRIDIUM) 200 MG TABLET    Take 1 tablet (200 mg total) by mouth 3 (three) times daily as needed for pain.

## 2016-09-23 NOTE — Patient Instructions (Signed)
[name], it was great seeing you again... Today we updated your med list in our EPIC system...    Continue your current medications the same...  Today we checked a 89yrf/u CXR...    We will contact you w/ the results when available...   Stay as active as poss (within the boundary set by your back problem)...  Call for any questions...  Let's plan a follow up visit in 157yrsooner if needed for any breathing problems...Marland KitchenMarland Kitchen

## 2016-09-27 ENCOUNTER — Telehealth: Payer: Self-pay | Admitting: Family Medicine

## 2016-09-27 NOTE — Telephone Encounter (Signed)
This pt called wanting to transfer over to see Baldo Ash but she had some questions before she did that. She wanted to know if Baldo Ash would prescribe Zolpidem and Tramadol? She said that she has been on both of them for years. She also wanted to know if she does vaginal exams. She asked if you could call her sometime after 1:00 today if you are available. Please advise.

## 2016-09-27 NOTE — Telephone Encounter (Signed)
Spoke with pt to let her know that Baldo Ash would not be able to see her. Informed her that she could check with the [facility] location.

## 2016-09-27 NOTE — Telephone Encounter (Signed)
I am not accepting transfers at this time due to upcoming transfer to another location. Thank you

## 2016-10-01 NOTE — Progress Notes (Signed)
Subjective:   Krystal Craig is a 83 y.o. female who presents for an Initial Medicare Annual Wellness Visit.  Review of Systems    No ROS.  Medicare Wellness Visit.  Cardiac Risk Factors include: advanced age (>32mn, >>61women)  Sleep patterns: 6-7 hrs/night, gets up once to urinate, sometimes naps. Home Safety/Smoke Alarms: Feels safe in home. Smoke alarms in place.   Living environment; residence and Firearm Safety: Lives with husband in 1 story home.  Seat Belt Safety/Bike Helmet: Wears seat belt.   Counseling:   Eye Exam- Yearly. Dental- Every 6 months.  Female:   Pap- Aged out.       Mammo-  11/16/2015. Normal per pt.      Dexa scan- 10/04/2014. Low bone mass.  Declined this exam.  CCS- 02/08/2014 - normal per pt.     Objective:    Today's Vitals   10/02/16 1427  BP: (!) 148/68  Pulse: 79  Resp: 14  Temp: 98.8 F (37.1 C)  TempSrc: Oral  SpO2: 96%  Weight: 137 lb 3.2 oz (62.2 kg)   Body mass index is 23.92 kg/m.   Current Medications (verified) Outpatient Encounter Prescriptions as of 10/02/2016  Medication Sig  . acetaminophen (TYLENOL ARTHRITIS PAIN) 650 MG CR tablet Take 650 mg by mouth every 8 (eight) hours as needed for pain.  .Marland Kitchenaspirin 81 MG tablet Take 81 mg by mouth daily.  . calcium carbonate (TUMS - DOSED IN MG ELEMENTAL CALCIUM) 500 MG chewable tablet Chew 3 tablets by mouth daily as needed for indigestion or heartburn. Reported on 07/01/2015  . Calcium Carbonate-Vitamin D (CALCIUM-VITAMIN D) 500-200 MG-UNIT tablet Take 1 tablet by mouth daily.  .Marland Kitchenconjugated estrogens (PREMARIN) vaginal cream Place 1 Applicatorful vaginally as needed.  . Multiple Vitamins-Minerals (MULTIVITAMIN WITH MINERALS) tablet Take 1 tablet by mouth daily.  . polyethylene glycol (MIRALAX / GLYCOLAX) packet Take 17 g by mouth daily.  . simvastatin (ZOCOR) 40 MG tablet TAKE ONE TABLET BY MOUTH AT BEDTIME  . traMADol (ULTRAM) 50 MG tablet Take 1 tablet (50 mg total) by mouth  every 6 (six) hours as needed for severe pain (may refill every 3 months).  . Zolpidem Tartrate 10 MG SUBL Place under the tongue.   No facility-administered encounter medications on file as of 10/02/2016.     Allergies (verified) Ciprocin-fluocin-procin [fluocinolone] and Codeine   History: Past Medical History:  Diagnosis Date  . Allergic rhinitis   . Chest pain 09/24/2011   normal stress test  . Complication of anesthesia   . Diverticulosis of colon   . DIVERTICULOSIS OF COLON 08/03/2008   Qualifier: Diagnosis of  By: NLenna GilfordMD, SDeborra Medina  . GERD (gastroesophageal reflux disease)   . H/O foot surgery   . Hiatal hernia   . History of bronchitis   . IBS (irritable bowel syndrome)   . Insomnia   . Lumbar back pain   . Malignant neoplasm of bronchus and lung, unspecified site 2006  . Osteoarthritis   . Osteopenia   . PONV (postoperative nausea and vomiting)   . Pure hypercholesterolemia   . Ulcer   . Urinary tract infection, site not specified    Past Surgical History:  Procedure Laterality Date  . ABDOMINAL HYSTERECTOMY  1969  . appendectomy  1969  . APPENDECTOMY    . bilateral cataract surgery  2012   Dr. BTalbert Forest . bilateral shouler operations for impingement syndromes  2001   Dr. PEddie Dibbles . CHOLECYSTECTOMY  [place]    . left lower lobectomy  11/2004   via VATS and mini-thoractomy for 1.5cm adenoca of lung by Dr. Arlyce Dice  . LUMBAR LAMINECTOMY/DECOMPRESSION MICRODISCECTOMY Left 01/05/2015   Procedure: L3-4 DECOMPRESSION MICRODISCECTOMY ON LEFT;  Surgeon: Melina Schools, MD;  Location: [facility];  Service: Orthopedics;  Laterality: Left;  left Lumbar 3-4  . turmor ear Right 09/2013  . urologic sugery with cystocele/rectocele repairs and sling  03/2007   Dr. Terance Hart   Family History  Problem Relation Age of Onset  . Breast cancer Sister 52  . Dementia Mother   . Heart attack Mother   . CVA Father   . Stroke Father   . Colon cancer Neg  Hx   . Esophageal cancer Neg Hx   . Pancreatic cancer Neg Hx   . Rectal cancer Neg Hx   . Stomach cancer Neg Hx    Social History   Occupational History  . Not on file.   Social History Main Topics  . Smoking status: Former Smoker    Packs/day: 1.00    Years: 30.00    Types: Cigarettes    Quit date: 06/17/1984  . Smokeless tobacco: Never Used  . Alcohol use No  . Drug use: No  . Sexual activity: Not Currently    Birth control/ protection: Post-menopausal     Comment: 1st intercourse 9 yo-2 partners    Tobacco Counseling Counseling given: Not Answered   Activities of Daily Living In your present state of health, do you have any difficulty performing the following activities: 10/02/2016  Hearing? N  Vision? N  Difficulty concentrating or making decisions? N  Walking or climbing stairs? N  Dressing or bathing? N  Doing errands, shopping? N  Preparing Food and eating ? N  Using the Toilet? N  In the past six months, have you accidently leaked urine? N  Do you have problems with loss of bowel control? N  Managing your Medications? N  Managing your Finances? N  Housekeeping or managing your Housekeeping? N  Some recent data might be hidden    Immunizations and Health Maintenance Immunization History  Administered Date(s) Administered  . Influenza Split 03/18/2011, 05/04/2012  . Influenza Whole 04/02/2006, 03/24/2009, 05/01/2010  . Influenza, High Dose Seasonal PF 03/25/2016  . Influenza,inj,Quad PF,36+ Mos 02/28/2014, 04/05/2015  . Influenza-Unspecified 03/17/2013  . Pneumococcal Conjugate-13 05/02/2014  . Pneumococcal Polysaccharide-23 05/04/2015  . Tetanus 02/28/2014   There are no preventive care reminders to display for this patient.  Patient Care Team: Jonathon Jordan, MD as PCP - General (Family Medicine)  Indicate any recent Medical Services you may have received from other than [facility] providers in the past year (date may be approximate).     Assessment:    This is a routine wellness examination for [place]. Physical assessment deferred to PCP.  Hearing/Vision screen Hearing Screening Comments: Able to hear conversational tones w/o difficulty. No issues reported.   Vision Screening Comments: Goes yearly. Scheduled for this month.  Dietary issues and exercise activities discussed: Current Exercise Habits: The patient does not participate in regular exercise at present, Exercise limited by: orthopedic condition(s)   Diet (meal preparation, eat out, water intake, caffeinated beverages, dairy products, fruits and vegetables): 3 meals/day. 1.5 cups coffee/day. Drinks around 3 cups/day. Breakfast: Eggs or english muffin, sometimes oatmeal Lunch: Sandwich Dinner: Chicken, vegetables.     Goals    . Maintain current health status      Depression Screen  PHQ 2/9 Scores 10/02/2016 09/12/2014 09/07/2012  PHQ - 2 Score 0 0 0    Fall Risk Fall Risk  10/02/2016 09/12/2014 09/08/2013  Falls in the past year? No No No    Cognitive Function:   Ad8 score reviewed for issues:  Issues making decisions: no  Less interest in hobbies / activities: no  Repeats questions, stories (family complaining): no  Trouble using ordinary gadgets (microwave, computer, phone): no  Forgets the month or year: no  Mismanaging finances: no  Remembering appts:no  Daily problems with thinking and/or memory:no Ad8 score is=0       Screening Tests Health Maintenance  Topic Date Due  . INFLUENZA VACCINE  01/15/2017  . TETANUS/TDAP  02/29/2024  . DEXA SCAN  Completed  . PNA vac Low Risk Adult  Completed      Plan:    Bring a copy of your advance directives to your next office visit.  During the course of the visit, Krystal Craig was educated and counseled about the following appropriate screening and preventive services:   Vaccines to include Pneumoccal, Influenza, Hepatitis B, Td, Zostavax, HCV  Cardiovascular disease screening  Colorectal cancer  screening  Bone density screening  Diabetes screening  Glaucoma screening  Mammography/PAP  Nutrition counseling  Smoking cessation counseling  Patient Instructions (the written plan) were given to the patient.    Ree Edman, RN   10/02/2016   I have personally reviewed the Medicare Annual Wellness questionnaire and have noted 1. The patient's medical and social history 2. Their use of alcohol, tobacco or illicit drugs 3. Their current medications and supplements 4. The patient's functional ability including ADL's, fall risks, home safety risks and hearing or visual impairment. 5. Diet and physical activities 6. Evidence for depression or mood disorders 7. Reviewed Updated provider list, see scanned forms and [facility] Snapshot.   The patients weight, height, BMI and visual acuity have been recorded in the chart I have made referrals, counseling and provided education to the patient based review of the above and I have provided the pt with a written personalized care plan for preventive services.  I have provided the patient with a copy of your personalized plan for preventive services. Instructed to take the time to review along with their updated medication list.  Briscoe Deutscher, D.O. [place], [facility]

## 2016-10-01 NOTE — Progress Notes (Signed)
Pre visit review using our clinic review tool, if applicable. No additional management support is needed unless otherwise documented below in the visit note. 

## 2016-10-02 ENCOUNTER — Encounter: Payer: Self-pay | Admitting: Surgical

## 2016-10-02 ENCOUNTER — Ambulatory Visit (INDEPENDENT_AMBULATORY_CARE_PROVIDER_SITE_OTHER): Payer: Medicare Other | Admitting: Family Medicine

## 2016-10-02 ENCOUNTER — Encounter: Payer: Self-pay | Admitting: Family Medicine

## 2016-10-02 VITALS — BP 148/68 | HR 79 | Temp 98.8°F | Resp 14 | Wt 137.2 lb

## 2016-10-02 DIAGNOSIS — Z0000 Encounter for general adult medical examination without abnormal findings: Secondary | ICD-10-CM | POA: Diagnosis not present

## 2016-10-02 DIAGNOSIS — M25541 Pain in joints of right hand: Secondary | ICD-10-CM

## 2016-10-02 DIAGNOSIS — M25542 Pain in joints of left hand: Secondary | ICD-10-CM

## 2016-10-02 MED ORDER — TRAMADOL HCL 50 MG PO TABS: 50.0000 mg | ORAL_TABLET | Freq: Four times a day (QID) | ORAL | 0 refills | Status: DC | PRN

## 2016-10-02 NOTE — Progress Notes (Signed)
Pre visit review using our clinic review tool, if applicable. No additional management support is needed unless otherwise documented below in the visit note. 

## 2016-10-02 NOTE — Patient Instructions (Signed)
Bring a copy of your advance directives to your next office visit.  

## 2016-10-02 NOTE — Progress Notes (Signed)
Krystal Craig is a 83 y.o. female is here to [place].   History of Present Illness:   Water quality scientist, CMA, acting as scribe for Dr. Juleen Craig.  Arthritis  Presents for initial visit. The disease course has been fluctuating. She complains of pain and stiffness. Affected locations include the right MCP and left MCP. Her pain is at a severity of 6/10. Associated symptoms include pain at night. Pertinent negatives include no diarrhea, dysuria, fever, rash, Raynaud's syndrome or weight loss. Her past medical history is significant for osteoarthritis and psoriasis. There is no history of rheumatoid arthritis.  Her pertinent risk factors include overuse. Past treatments include an opioid. The treatment provided significant relief. Factors aggravating her arthritis include gripping. Compliance with prior treatments has been good.   There are no preventive care reminders to display for this patient.  PMHx, SurgHx, SocialHx, Medications, and Allergies were reviewed in the Visit Navigator and updated as appropriate.   Past Medical History:  Diagnosis Date  . Allergic rhinitis   . Chest pain 09/24/2011   normal stress test  . Complication of anesthesia   . Diverticulosis of colon   . DIVERTICULOSIS OF COLON 08/03/2008   Qualifier: Diagnosis of  By: Krystal Gilford MD, Krystal Craig   . GERD (gastroesophageal reflux disease)   . H/O foot surgery   . Hiatal hernia   . History of bronchitis   . IBS (irritable bowel syndrome)   . Insomnia   . Lumbar back pain   . Malignant neoplasm of bronchus and lung, unspecified site 2006  . Osteoarthritis   . Osteopenia   . PONV (postoperative nausea and vomiting)   . Pure hypercholesterolemia   . Ulcer   . Urinary tract infection, site not specified     Past Surgical History:  Procedure Laterality Date  . ABDOMINAL HYSTERECTOMY  1969  . appendectomy  1969  . APPENDECTOMY    . bilateral cataract surgery  2012   Dr. Talbert Craig  . bilateral shouler operations for  impingement syndromes  2001   Dr. Eddie Craig  . CHOLECYSTECTOMY  1976  . LAPAROSCOPIC LYSIS INTESTINAL ADHESIONS    . left lower lobectomy  11/2004   via VATS and mini-thoractomy for 1.5cm adenoca of lung by Dr. Arlyce Craig  . LUMBAR LAMINECTOMY/DECOMPRESSION MICRODISCECTOMY Left 01/05/2015   Procedure: L3-4 DECOMPRESSION MICRODISCECTOMY ON LEFT;  Surgeon: Krystal Schools, MD;  Location: [facility];  Service: Orthopedics;  Laterality: Left;  left Lumbar 3-4  . turmor ear Right 09/2013  . urologic sugery with cystocele/rectocele repairs and sling  03/2007   Krystal Craig    Family History  Problem Relation Age of Onset  . Breast cancer Sister 17  . Dementia Mother   . Heart attack Mother   . CVA Father   . Stroke Father   . Colon cancer Neg Hx   . Esophageal cancer Neg Hx   . Pancreatic cancer Neg Hx   . Rectal cancer Neg Hx   . Stomach cancer Neg Hx    Social History  Substance Use Topics  . Smoking status: Former Smoker    Packs/day: 1.00    Years: 30.00    Types: Cigarettes    Quit date: 06/17/1984  . Smokeless tobacco: Never Used  . Alcohol use No   Current Medications and Allergies:   .  aspirin 81 MG tablet, Take 81 mg by mouth daily., Disp: , Rfl:  .  calcium carbonate (TUMS - DOSED IN MG ELEMENTAL CALCIUM) 500 MG chewable  tablet, Chew 3 tablets by mouth daily as needed for indigestion or heartburn. Reported on 07/01/2015, Disp: , Rfl:  .  Calcium Carbonate-Vitamin D (CALCIUM-VITAMIN D) 500-200 MG-UNIT tablet, Take 1 tablet by mouth daily., Disp: , Rfl:  .  conjugated estrogens (PREMARIN) vaginal cream, Place 1 Applicatorful vaginally as needed., Disp: , Rfl:  .  Multiple Vitamins-Minerals (MULTIVITAMIN WITH MINERALS) tablet, Take 1 tablet by mouth daily., Disp: , Rfl:  .  simvastatin (ZOCOR) 40 MG tablet, TAKE ONE TABLET BY MOUTH AT BEDTIME, Disp: 90 tablet, Rfl: 0 .  traMADol (ULTRAM) 50 MG tablet, Take 1 tablet (50 mg total) by mouth every 6 (six) hours as needed for severe pain (may  refill every 3 months)., Disp: 270 tablet, Rfl: 1 .  Zolpidem Tartrate 10 MG SUBL, Place under the tongue., Disp: , Rfl:   Allergies  Allergen Reactions  . Ciprocin-Fluocin-Procin [Fluocinolone] Other (See Comments)    Leg and arm numbness  . Codeine     Chest pain/tightness   Review of Systems:   Review of Systems  Constitutional: Negative for chills, fever, malaise/fatigue and weight loss.  Respiratory: Negative for cough, shortness of breath and wheezing.   Cardiovascular: Negative for chest pain, palpitations and leg swelling.  Gastrointestinal: Negative for abdominal pain, constipation, diarrhea, nausea and vomiting.  Genitourinary: Negative for dysuria and urgency.  Musculoskeletal: Positive for arthritis, joint pain and stiffness. Negative for myalgias.  Skin: Negative for rash.  Neurological: Negative for dizziness and headaches.  Psychiatric/Behavioral: Negative for depression, substance abuse and suicidal ideas. The patient is not nervous/anxious.     Vitals:   Vitals:   10/02/16 1427  BP: (!) 148/68  Pulse: 79  Resp: 14  Temp: 98.8 F (37.1 C)  TempSrc: Oral  SpO2: 96%  Weight: 137 lb 3.2 oz (62.2 kg)     Body mass index is 23.92 kg/m.  Physical Exam:   Physical Exam  Constitutional: She appears well-nourished.  HENT:  Head: Normocephalic and atraumatic.  Eyes: EOM are normal. Pupils are equal, round, and reactive to light.  Neck: Normal range of motion. Neck supple.  Cardiovascular: Normal rate, regular rhythm, normal heart sounds and intact distal pulses.   Pulmonary/Chest: Effort normal.  Abdominal: Soft.  Skin: Skin is warm.  Psychiatric: She has a normal mood and affect. Her behavior is normal.  Nursing note and vitals reviewed.    Assessment and Plan:    Krystal Craig was seen today for establish care and medicare wellness.  Diagnoses and all orders for this visit:  Encounter for Medicare annual wellness exam  Arthralgia of both  hands Comments: Okay refill of Rx and will take over. Controlled substance contract signed.  Orders: -     traMADol (ULTRAM) 50 MG tablet; Take 1 tablet (50 mg total) by mouth every 6 (six) hours as needed.    . Reviewed expectations re: course of current medical issues. . Discussed self-management of symptoms. . Outlined signs and symptoms indicating need for more acute intervention. . Patient verbalized understanding and all questions were answered. . See orders for this visit as documented in the electronic medical record. . Patient received an After Visit Summary.  Records requested if needed. I spent 30 minutes with this patient, greater than 50% was face-to-face time counseling regarding the above diagnoses.  CMA served as Education administrator during this visit. History, Physical, and Plan performed by medical provider. Documentation and orders reviewed and attested to. Briscoe Deutscher, D.O.  Briscoe Deutscher, [place], [facility]  10/09/2016   Follow-up: Return in about 3 months (around 01/01/2017).  Meds ordered this encounter  Medications  . acetaminophen (TYLENOL ARTHRITIS PAIN) 650 MG CR tablet    Sig: Take 650 mg by mouth every 8 (eight) hours as needed for pain.  . polyethylene glycol (MIRALAX / GLYCOLAX) packet    Sig: Take 17 g by mouth daily.  Marland Kitchen DISCONTD: traMADol (ULTRAM) 50 MG tablet    Sig: Take 1 tablet (50 mg total) by mouth every 6 (six) hours as needed.    Dispense:  90 tablet    Refill:  0    May refill every 3 months.   Medications Discontinued During This Encounter  Medication Reason  . traMADol (ULTRAM) 50 MG tablet    No orders of the defined types were placed in this encounter.

## 2016-10-04 ENCOUNTER — Telehealth: Payer: Self-pay | Admitting: Family Medicine

## 2016-10-04 ENCOUNTER — Other Ambulatory Visit: Payer: Self-pay

## 2016-10-04 DIAGNOSIS — M25541 Pain in joints of right hand: Secondary | ICD-10-CM

## 2016-10-04 DIAGNOSIS — M25542 Pain in joints of left hand: Principal | ICD-10-CM

## 2016-10-04 MED ORDER — TRAMADOL HCL 50 MG PO TABS: 50.0000 mg | ORAL_TABLET | Freq: Four times a day (QID) | ORAL | 0 refills | Status: DC | PRN

## 2016-10-04 NOTE — Telephone Encounter (Signed)
She is correct - my mistake. Okay to call in corrected number and apologize to patient for me.

## 2016-10-04 NOTE — Telephone Encounter (Signed)
Patient called in to state that we prescribed traMADol (ULTRAM) 50 MG tablet [[id number]]  Incorrectly. The qty is 90 in our system, but she states th qty should be 270 because she takes TID. She asks that you send in an updated script

## 2016-10-04 NOTE — Telephone Encounter (Signed)
Please advise 

## 2016-10-04 NOTE — Telephone Encounter (Signed)
New Rx called in and patient notified.  Apologized for the mixup.  Patient verbalized understanding and was thankful for the corrected Rx.

## 2016-10-09 ENCOUNTER — Telehealth: Payer: Self-pay | Admitting: Family Medicine

## 2016-10-09 ENCOUNTER — Encounter: Payer: Self-pay | Admitting: Family Medicine

## 2016-10-09 NOTE — Telephone Encounter (Signed)
ROI faxed to [facility] ([place]).

## 2016-10-10 DIAGNOSIS — H2513 Age-related nuclear cataract, bilateral: Secondary | ICD-10-CM | POA: Diagnosis not present

## 2016-10-16 ENCOUNTER — Telehealth: Payer: Self-pay | Admitting: Family Medicine

## 2016-10-16 NOTE — Telephone Encounter (Signed)
10/16/16 [facility] sent 22 pages for Krystal Craig interoffice mailed them to [place]. PWR

## 2016-10-21 ENCOUNTER — Encounter: Payer: Self-pay | Admitting: Radiology

## 2016-10-28 ENCOUNTER — Other Ambulatory Visit: Payer: Self-pay | Admitting: Family Medicine

## 2016-10-29 MED ORDER — SIMVASTATIN 40 MG PO TABS: 40.0000 mg | ORAL_TABLET | Freq: Every day | ORAL | 0 refills | Status: DC

## 2016-10-30 ENCOUNTER — Encounter: Payer: Self-pay | Admitting: Gynecology

## 2016-11-05 ENCOUNTER — Telehealth: Payer: Self-pay | Admitting: Family Medicine

## 2016-11-05 NOTE — Telephone Encounter (Signed)
Patient called to report that she has diarrhea + anxiety and was scheduled for an acute visit tomorrow morning w/ Dr. Juleen China at 10:45am.  Thank you,  -LLL

## 2016-11-06 ENCOUNTER — Ambulatory Visit: Payer: Medicare Other | Admitting: Family Medicine

## 2016-11-06 ENCOUNTER — Encounter: Payer: Self-pay | Admitting: Family Medicine

## 2016-11-06 ENCOUNTER — Ambulatory Visit (INDEPENDENT_AMBULATORY_CARE_PROVIDER_SITE_OTHER): Payer: Medicare Other | Admitting: Family Medicine

## 2016-11-06 VITALS — BP 134/82 | HR 81 | Temp 98.8°F | Ht 63.5 in | Wt 132.0 lb

## 2016-11-06 DIAGNOSIS — R197 Diarrhea, unspecified: Secondary | ICD-10-CM

## 2016-11-06 DIAGNOSIS — F418 Other specified anxiety disorders: Secondary | ICD-10-CM

## 2016-11-06 DIAGNOSIS — F419 Anxiety disorder, unspecified: Secondary | ICD-10-CM | POA: Diagnosis not present

## 2016-11-06 HISTORY — DX: Other specified anxiety disorders: F41.8

## 2016-11-06 LAB — COMPREHENSIVE METABOLIC PANEL
ALT: 20 U/L (ref 0–35)
AST: 25 U/L (ref 0–37)
Albumin: 4.6 g/dL (ref 3.5–5.2)
Alkaline Phosphatase: 54 U/L (ref 39–117)
BUN: 13 mg/dL (ref 6–23)
CO2: 28 mEq/L (ref 19–32)
Calcium: 10.3 mg/dL (ref 8.4–10.5)
Chloride: 102 mEq/L (ref 96–112)
Creatinine, Ser: 0.97 mg/dL (ref 0.40–1.20)
GFR: 58.5 mL/min — ABNORMAL LOW (ref >60.00)
Glucose, Bld: 111 mg/dL — ABNORMAL HIGH (ref 70–99)
Potassium: 4.4 mEq/L (ref 3.5–5.1)
Sodium: 137 mEq/L (ref 135–145)
Total Bilirubin: 0.9 mg/dL (ref 0.2–1.2)
Total Protein: 7.2 g/dL (ref 6.0–8.3)

## 2016-11-06 MED ORDER — CLONAZEPAM 0.5 MG PO TABS: 0.5000 mg | ORAL_TABLET | Freq: Two times a day (BID) | ORAL | 0 refills | Status: DC | PRN

## 2016-11-06 NOTE — Progress Notes (Signed)
Krystal Craig is a 83 y.o. female here for an acute visit.  History of Present Illness:   Water quality scientist, CMA, acting as scribe for Dr. Juleen China.  Anxiety  Onset was 1 to 4 weeks ago. The problem has been gradually worsening. Symptoms include nervous/anxious behavior. Patient reports no chest pain, depressed mood, dizziness, palpitations or shortness of breath. Symptoms occur most days. The severity of symptoms is moderate. The symptoms are aggravated by family issues. The quality of sleep is good. Nighttime awakenings: one to two.   There are no known risk factors. There is no history of depression. Past treatments include nothing.  Diarrhea   This is a new problem. The current episode started in the past 7 days. The problem occurs 2 to 4 times per day. The problem has been gradually improving. The stool consistency is described as watery. The patient states that diarrhea does not awaken her from sleep. Associated symptoms include abdominal pain. Pertinent negatives include no chills, coughing, fever, headaches or vomiting. Associated symptoms comments: Cramps with diarrhea . Nothing aggravates the symptoms.   PMHx, SurgHx, SocialHx, Medications, and Allergies were reviewed in the Visit Navigator and updated as appropriate.  Current Medications:   Current Outpatient Prescriptions:  .  acetaminophen (TYLENOL ARTHRITIS PAIN) 650 MG CR tablet, Take 650 mg by mouth every 8 (eight) hours as needed for pain., Disp: , Rfl:  .  aspirin 81 MG tablet, Take 81 mg by mouth daily., Disp: , Rfl:  .  calcium carbonate (TUMS - DOSED IN MG ELEMENTAL CALCIUM) 500 MG chewable tablet, Chew 3 tablets by mouth daily as needed for indigestion or heartburn. Reported on 07/01/2015, Disp: , Rfl:  .  Calcium Carbonate-Vitamin D (CALCIUM-VITAMIN D) 500-200 MG-UNIT tablet, Take 1 tablet by mouth daily., Disp: , Rfl:  .  conjugated estrogens (PREMARIN) vaginal cream, Place 1 Applicatorful vaginally as needed., Disp: ,  Rfl:  .  Multiple Vitamins-Minerals (MULTIVITAMIN WITH MINERALS) tablet, Take 1 tablet by mouth daily., Disp: , Rfl:  .  polyethylene glycol (MIRALAX / GLYCOLAX) packet, Take 17 g by mouth daily., Disp: , Rfl:  .  simvastatin (ZOCOR) 40 MG tablet, Take 1 tablet (40 mg total) by mouth at bedtime., Disp: 90 tablet, Rfl: 0 .  traMADol (ULTRAM) 50 MG tablet, Take 1 tablet (50 mg total) by mouth every 6 (six) hours as needed., Disp: 270 tablet, Rfl: 0 .  Zolpidem Tartrate 10 MG SUBL, Place under the tongue., Disp: , Rfl:   Allergies  Allergen Reactions  . Ciprocin-Fluocin-Procin [Fluocinolone] Other (See Comments)    Leg and arm numbness  . Codeine     Chest pain/tightness  . Wellbutrin [Bupropion] Rash   Review of Systems:   Review of Systems  Constitutional: Negative for chills and fever.  HENT: Negative for congestion, ear pain and sore throat.   Eyes: Negative for blurred vision and pain.  Respiratory: Negative for cough and shortness of breath.   Cardiovascular: Negative for chest pain and palpitations.  Gastrointestinal: Positive for abdominal pain and diarrhea. Negative for vomiting.  Genitourinary: Negative for frequency.  Musculoskeletal: Negative for back pain and neck pain.  Skin: Negative for rash.  Neurological: Negative for dizziness, loss of consciousness and headaches.  Endo/Heme/Allergies: Does not bruise/bleed easily.  Psychiatric/Behavioral: Negative for depression. The patient is nervous/anxious.    Vitals:   Vitals:   11/06/16 1021  BP: 134/82  Pulse: 81  Temp: 98.8 F (37.1 C)  TempSrc: Oral  SpO2: 96%  Weight:  132 lb (59.9 kg)  Height: 5' 3.5" (1.613 m)     Body mass index is 23.02 kg/m.  Physical Exam:   Physical Exam  Results for orders placed or performed in visit on 16/07/37  Basic metabolic panel  Result Value Ref Range   Glucose 97 mg/dL   BUN 17 4 - 21 mg/dL   Creatinine 1.1 0.5 - 1.1 mg/dL   Potassium 4.4 3.4 - 5.3 mmol/L   Sodium  139 137 - 147 mmol/L  Lipid panel  Result Value Ref Range   Triglycerides 193 (A) 40 - 160 mg/dL   Cholesterol 171 0 - 200 mg/dL   HDL 58 35 - 70 mg/dL   LDL Cholesterol 75 mg/dL  Hepatic function panel  Result Value Ref Range   ALT 25 7 - 35 U/L  Hepatic function panel  Result Value Ref Range   AST 26 13 - 35 U/L  Hemoglobin A1c  Result Value Ref Range   Hemoglobin A1C 5.9   CBC and differential  Result Value Ref Range   Hemoglobin 12.6 12.0 - 16.0 g/dL   HCT 39 36 - 46 %   Platelets 279 150 - 399 K/L   WBC 6.0 10^3/mL  VITAMIN D 25 Hydroxy (Vit-D Deficiency, Fractures)  Result Value Ref Range   Vit D, 25-Hydroxy 10.6   Basic metabolic panel  Result Value Ref Range   Glucose 93 mg/dL   BUN 17 4 - 21 mg/dL   Creatinine 1.0 0.5 - 1.1 mg/dL   Potassium 4.4 3.4 - 5.3 mmol/L   Sodium 139 137 - 147 mmol/L  Lipid panel  Result Value Ref Range   Triglycerides 364 (A) 40 - 160 mg/dL   Cholesterol 208 (A) 0 - 200 mg/dL   HDL 53 35 - 70 mg/dL   LDL Cholesterol 83 mg/dL  Hepatic function panel  Result Value Ref Range   ALT 36 (A) 7 - 35 U/L   AST 34 13 - 35 U/L  Hemoglobin A1c  Result Value Ref Range   Hemoglobin A1C 6.1   TSH  Result Value Ref Range   TSH 1.24 0.41 - 5.90 uIU/mL    Assessment and Plan:   Cj was seen today for acute visit, anxiety and diarrhea.  Diagnoses and all orders for this visit:  Diarrhea, unspecified type Comments: Improving. The patient is staying hydrated. No red flags on exam or with history. We'll check CMP for thoroughness. Orders: -     Comprehensive metabolic panel  Results for orders placed or performed in visit on 11/06/16  Comprehensive metabolic panel  Result Value Ref Range   Sodium 137 135 - 145 mEq/L   Potassium 4.4 3.5 - 5.1 mEq/L   Chloride 102 96 - 112 mEq/L   CO2 28 19 - 32 mEq/L   Glucose, Bld 111 (H) 70 - 99 mg/dL   BUN 13 6 - 23 mg/dL   Creatinine, Ser 0.97 0.40 - 1.20 mg/dL   Total Bilirubin 0.9 0.2 -  1.2 mg/dL   Alkaline Phosphatase 54 39 - 117 U/L   AST 25 0 - 37 U/L   ALT 20 0 - 35 U/L   Total Protein 7.2 6.0 - 8.3 g/dL   Albumin 4.6 3.5 - 5.2 g/dL   Calcium 10.3 8.4 - 10.5 mg/dL   GFR 58.50 (L) >60.00 mL/min   Anxiety Comments: Significant and causing impairment in daily function. May be contributing to some of her diarrhea. The patient has tried other  medications in the past and has not tolerated them. She also had a rash with Wellbutrin. She takes tramadol daily for her moderate to severe pain from arthritis. She is interested in taking something as needed, and hopefully only for a few weeks to help with her anxiety cycle. She is interested in therapy during this time as well. No red flags. No suicide or homicide ideations. After discussion, patient would like to start below medication. Expectations, risks, and potential side effects reviewed. Another option is Buspar. If the Klonopin causes too much sedation, she is to stop immediately. She will not take the Ambien if she takes the Klonopin. Orders: -     clonazePAM (KLONOPIN) 0.5 MG tablet; Take 1 tablet (0.5 mg total) by mouth 2 (two) times daily as needed for anxiety.   . Reviewed expectations re: course of current medical issues. . Discussed self-management of symptoms. . Outlined signs and symptoms indicating need for more acute intervention. . Patient verbalized understanding and all questions were answered. Marland Kitchen Health Maintenance issues including appropriate healthy diet, exercise, and smoking avoidance were discussed with patient. . See orders for this visit as documented in the electronic medical record. . Patient received an After Visit Summary.  CMA served as Education administrator during this visit. History, Physical, and Plan performed by medical provider. The above documentation has been reviewed and is accurate and complete. Briscoe Deutscher, D.O.  Briscoe Deutscher, DO , [facility] 11/11/2016  Future Appointments Date Time  Provider [place]  01/01/2017 9:45 AM Briscoe Deutscher, DO [facility] None  09/24/2017 10:30 AM Noralee Space, MD [facility] None  10/03/2017 11:00 AM Stephanie Acre, RN [facility] None

## 2016-11-11 DIAGNOSIS — K5790 Diverticulosis of intestine, part unspecified, without perforation or abscess without bleeding: Secondary | ICD-10-CM | POA: Insufficient documentation

## 2016-11-11 DIAGNOSIS — Z85118 Personal history of other malignant neoplasm of bronchus and lung: Secondary | ICD-10-CM | POA: Insufficient documentation

## 2016-11-11 DIAGNOSIS — K2100 Gastro-esophageal reflux disease with esophagitis, without bleeding: Secondary | ICD-10-CM | POA: Insufficient documentation

## 2016-11-13 ENCOUNTER — Telehealth: Payer: Self-pay | Admitting: Surgical

## 2016-11-13 NOTE — Telephone Encounter (Signed)
Spoke with patient and she stated that she is feeling better. She said that the klonopin is making her sleepy. I advised that the medication can cause drowsiness. She stated that it helps her sleep at night so she has been taking before bed. Patient stated that she has stopped taking the Zolpidem since the klonopin has been helping her sleep.

## 2016-11-19 ENCOUNTER — Encounter: Payer: Self-pay | Admitting: *Deleted

## 2016-11-25 DIAGNOSIS — Z803 Family history of malignant neoplasm of breast: Secondary | ICD-10-CM | POA: Diagnosis not present

## 2016-11-25 DIAGNOSIS — Z1231 Encounter for screening mammogram for malignant neoplasm of breast: Secondary | ICD-10-CM | POA: Diagnosis not present

## 2016-11-25 LAB — HM MAMMOGRAPHY

## 2016-12-04 ENCOUNTER — Ambulatory Visit (INDEPENDENT_AMBULATORY_CARE_PROVIDER_SITE_OTHER): Payer: Medicare Other | Admitting: Family Medicine

## 2016-12-04 ENCOUNTER — Encounter: Payer: Self-pay | Admitting: Family Medicine

## 2016-12-04 VITALS — BP 128/84 | HR 71 | Temp 98.5°F | Ht 63.5 in | Wt 134.8 lb

## 2016-12-04 DIAGNOSIS — F419 Anxiety disorder, unspecified: Secondary | ICD-10-CM | POA: Diagnosis not present

## 2016-12-04 DIAGNOSIS — G4700 Insomnia, unspecified: Secondary | ICD-10-CM | POA: Diagnosis not present

## 2016-12-04 MED ORDER — CLONAZEPAM 0.5 MG PO TABS: 0.5000 mg | ORAL_TABLET | Freq: Every day | ORAL | 3 refills | Status: DC

## 2016-12-04 MED ORDER — TRAZODONE HCL 50 MG PO TABS: ORAL_TABLET | ORAL | 0 refills | Status: DC

## 2016-12-04 NOTE — Progress Notes (Signed)
Krystal Craig is a 83 y.o. female is here for follow up.  History of Present Illness:   Water quality scientist, CMA, acting as scribe for Dr. Juleen China.  HPI: Patient comes in for follow up today.  States clonazepam was helping her sleep when she initially took it.  She stopped taking Ambien at that time.  Reports that clonazepam is no longer helping her sleep but does help her anxiety.  She takes it once per day.  She began taking Ambien again due to difficulty sleeping.  She would like to know if there is an alternative to Ambien she can try.  No other concerns or complaints today.  There are no preventive care reminders to display for this patient.  PMHx, SurgHx, SocialHx, FamHx, Medications, and Allergies were reviewed in the Visit Navigator and updated as appropriate.   Patient Active Problem List   Diagnosis Date Noted  . Diverticulosis 11/11/2016  . GERD with esophagitis 11/11/2016  . Hx of cancer of lung 11/11/2016  . Anxiety 11/06/2016  . Primary osteoarthritis of both first carpometacarpal joints 12/28/2015  . Anxiety and depression 04/05/2015  . Lumbar disc herniation 01/05/2015  . Vaginal dryness 09/12/2014  . Microscopic hematuria 09/12/2014  . Psoriasis 02/28/2014  . CKD (chronic kidney disease), stage III 02/28/2014  . Thoracic lymphadenopathy 09/25/2011  . Hyperlipidemia 09/25/2011  . Adenocarcinoma of lung ([place]) 08/03/2008  . History of recurrent UTIs 08/03/2008  . Osteopenia 08/03/2008  . Insomnia 08/03/2008  . Allergic rhinitis 06/26/2007  . GERD 06/26/2007  . Diaphragmatic hernia 06/26/2007  . Irritable bowel syndrome 06/26/2007  . Osteoarthritis 06/26/2007   Social History  Substance Use Topics  . Smoking status: Former Smoker    Packs/day: 1.00    Years: 30.00    Types: Cigarettes    Quit date: 06/17/1984  . Smokeless tobacco: Never Used  . Alcohol use No   Current Medications and Allergies:   .  acetaminophen (TYLENOL ARTHRITIS PAIN) 650 MG CR tablet,  Take 650 mg by mouth every 8 (eight) hours as needed for pain., Disp: , Rfl:  .  aspirin 81 MG tablet, Take 81 mg by mouth daily., Disp: , Rfl:  .  calcium carbonate (TUMS - DOSED IN MG ELEMENTAL CALCIUM) 500 MG chewable tablet, Chew 3 tablets by mouth daily as needed for indigestion or heartburn. Reported on 07/01/2015, Disp: , Rfl:  .  Calcium Carbonate-Vitamin D (CALCIUM-VITAMIN D) 500-200 MG-UNIT tablet, Take 1 tablet by mouth daily., Disp: , Rfl:  .  clonazePAM (KLONOPIN) 0.5 MG tablet, Take 1 tablet (0.5 mg total) by mouth daily., Disp: 30 tablet, Rfl: 3 .  conjugated estrogens (PREMARIN) vaginal cream, Place 1 Applicatorful vaginally as needed., Disp: , Rfl:  .  Multiple Vitamins-Minerals (MULTIVITAMIN WITH MINERALS) tablet, Take 1 tablet by mouth daily., Disp: , Rfl:  .  polyethylene glycol (MIRALAX / GLYCOLAX) packet, Take 17 g by mouth daily., Disp: , Rfl:  .  simvastatin (ZOCOR) 40 MG tablet, Take 1 tablet (40 mg total) by mouth at bedtime., Disp: 90 tablet, Rfl: 0 .  traMADol (ULTRAM) 50 MG tablet, Take 1 tablet (50 mg total) by mouth every 6 (six) hours as needed., Disp: 270 tablet, Rfl: 0 .  Zolpidem Tartrate 10 MG SUBL, Place under the tongue., Disp: , Rfl:   Allergies  Allergen Reactions  . Ciprocin-Fluocin-Procin [Fluocinolone] Other (See Comments)    Leg and arm numbness  . Codeine     Chest pain/tightness  . Wellbutrin [Bupropion] Rash  Review of Systems   Review of Systems  Constitutional: Negative for chills, fever, malaise/fatigue and weight loss.  Respiratory: Negative for cough, shortness of breath and wheezing.   Cardiovascular: Negative for chest pain, palpitations and leg swelling.  Gastrointestinal: Negative for abdominal pain, constipation, diarrhea, nausea and vomiting.  Genitourinary: Negative for dysuria and urgency.  Musculoskeletal: Negative for joint pain and myalgias.  Skin: Negative for rash.  Neurological: Negative for dizziness and headaches.    Psychiatric/Behavioral: Negative for depression, substance abuse and suicidal ideas. The patient has insomnia. The patient is not nervous/anxious.    Vitals:   Vitals:   12/04/16 1051  BP: 128/84  Pulse: 71  Temp: 98.5 F (36.9 C)  TempSrc: Oral  SpO2: 96%  Weight: 134 lb 12.8 oz (61.1 kg)  Height: 5' 3.5" (1.613 m)     Body mass index is 23.5 kg/m.   Physical Exam:   Physical Exam  Constitutional: She appears well-developed and well-nourished. No distress.  HENT:  Head: Normocephalic and atraumatic.  Eyes: EOM are normal. Pupils are equal, round, and reactive to light.  Neck: Normal range of motion. Neck supple.  Cardiovascular: Normal rate, regular rhythm, normal heart sounds and intact distal pulses.   Pulmonary/Chest: Effort normal.  Abdominal: Soft.  Skin: Skin is warm.  Psychiatric: She has a normal mood and affect. Her behavior is normal.  Nursing note and vitals reviewed.  Assessment and Plan:   Krystal Craig was seen today for follow-up.  Diagnoses and all orders for this visit:  Insomnia, unspecified type Comments: After discussion, patient would like to start below medication. Expectations, risks, and potential side effects reviewed.  Orders: -     traZODone (DESYREL) 50 MG tablet; Take 1/2 to 1 at bedtime as needed for sleep  Anxiety Comments: Using sparingly. Continue current treatment.  Orders: -     clonazePAM (KLONOPIN) 0.5 MG tablet; Take 1 tablet (0.5 mg total) by mouth daily.    . Reviewed expectations re: course of current medical issues. . Discussed self-management of symptoms. . Outlined signs and symptoms indicating need for more acute intervention. . Patient verbalized understanding and all questions were answered. Marland Kitchen Health Maintenance issues including appropriate healthy diet, exercise, and smoking avoidance were discussed with patient. . See orders for this visit as documented in the electronic medical record. . Patient received an  After Visit Summary.  CMA served as Education administrator during this visit. History, Physical, and Plan performed by medical provider. The above documentation has been reviewed and is accurate and complete. Briscoe Deutscher, D.O.  Briscoe Deutscher, DO [place], [facility] 12/05/2016  Future Appointments Date Time Provider [place]  06/05/2017 11:30 AM Briscoe Deutscher, DO [facility] None  09/24/2017 10:30 AM Noralee Space, MD [facility] None  10/03/2017 11:00 AM Stephanie Acre, RN [facility] None

## 2017-01-01 ENCOUNTER — Ambulatory Visit: Payer: Medicare Other | Admitting: Family Medicine

## 2017-02-05 ENCOUNTER — Other Ambulatory Visit: Payer: Self-pay | Admitting: Family Medicine

## 2017-02-05 DIAGNOSIS — M25542 Pain in joints of left hand: Principal | ICD-10-CM

## 2017-02-05 DIAGNOSIS — M25541 Pain in joints of right hand: Secondary | ICD-10-CM

## 2017-02-06 MED ORDER — SIMVASTATIN 40 MG PO TABS: 40.0000 mg | ORAL_TABLET | Freq: Every day | ORAL | 0 refills | Status: DC

## 2017-02-06 MED ORDER — TRAMADOL HCL 50 MG PO TABS: 50.0000 mg | ORAL_TABLET | Freq: Four times a day (QID) | ORAL | 0 refills | Status: DC | PRN

## 2017-02-06 NOTE — Telephone Encounter (Signed)
Needs a call back from CMA to fill Tramedol.  TY,  -LL

## 2017-02-06 NOTE — Telephone Encounter (Signed)
Spoke with pharmacy about prescription. They was calling due to patient being on Tramadol and Klonopin before they would fill. Dr. Juleen China prescribed both medications and patient takes PRN.

## 2017-02-06 NOTE — Telephone Encounter (Signed)
Refill request was forwarded to Dr. Juleen China on 02/05/17 for approval.  Waiting for approval.  We have been seeing patients all morning.

## 2017-02-24 ENCOUNTER — Telehealth: Payer: Self-pay

## 2017-02-24 NOTE — Telephone Encounter (Signed)
Patient informed. 

## 2017-02-24 NOTE — Telephone Encounter (Signed)
Okay to take the Azo-  have her keep scheduled appointment for UA.

## 2017-02-24 NOTE — Telephone Encounter (Signed)
Has appt to see you in the morning with UTI sx.  Patient asked if ok to take OTC Azo to help with pain relief. Wanted to be sure that would not interfere with testing urine tomorrow.

## 2017-02-25 ENCOUNTER — Encounter: Payer: Self-pay | Admitting: Women's Health

## 2017-02-25 ENCOUNTER — Ambulatory Visit (INDEPENDENT_AMBULATORY_CARE_PROVIDER_SITE_OTHER): Payer: Medicare Other | Admitting: Women's Health

## 2017-02-25 VITALS — BP 120/77

## 2017-02-25 DIAGNOSIS — N3000 Acute cystitis without hematuria: Secondary | ICD-10-CM | POA: Diagnosis not present

## 2017-02-25 DIAGNOSIS — R3915 Urgency of urination: Secondary | ICD-10-CM

## 2017-02-25 DIAGNOSIS — N390 Urinary tract infection, site not specified: Secondary | ICD-10-CM | POA: Insufficient documentation

## 2017-02-25 MED ORDER — NITROFURANTOIN MONOHYD MACRO 100 MG PO CAPS: 100.0000 mg | ORAL_CAPSULE | Freq: Two times a day (BID) | ORAL | 0 refills | Status: AC

## 2017-02-25 MED ORDER — METOCLOPRAMIDE HCL 10 MG PO TABS: 10.0000 mg | ORAL_TABLET | Freq: Four times a day (QID) | ORAL | 0 refills | Status: DC

## 2017-02-25 NOTE — Patient Instructions (Signed)

## 2017-02-25 NOTE — Progress Notes (Signed)
Presents with complaint of increased urinary frequency, urgency, burning with urination for the past 4 days with increased bladder pressure.  Using Azo with minimal relief. Has had a UTI in the past with similar symptoms. Denies vaginal discharge, irritation, not sexually active, hysterectomy. History of lung cancer, GERD, IBS.  Exam: Appears well. No CVAT. External genitalia within normal limits,. Vaginal exam not done, no visible erythema. UA: 10- 20 WBCs, 3-10 RBCs, moderate bacteria, biochemicals not reported due to orange color from Azo.  UTI  Plan: Macrobid twice a day for 7 days, take with food and Reglan 10 mg prescription given to take also per request. History of difficulty with medications and nausea. UTI prevention discussed and is aware. Over-the-counter cranberry supplement discussed. Urine culture pending.

## 2017-02-28 LAB — URINALYSIS W MICROSCOPIC + REFLEX CULTURE: HYALINE CAST: NONE SEEN /LPF

## 2017-02-28 LAB — URINE CULTURE
MICRO NUMBER: 81005549
SPECIMEN QUALITY:: ADEQUATE

## 2017-02-28 LAB — CULTURE INDICATED

## 2017-03-04 ENCOUNTER — Encounter: Payer: Self-pay | Admitting: Women's Health

## 2017-03-20 ENCOUNTER — Encounter: Payer: Self-pay | Admitting: Family Medicine

## 2017-04-17 ENCOUNTER — Telehealth: Payer: Self-pay | Admitting: Family Medicine

## 2017-04-17 NOTE — Telephone Encounter (Signed)
MEDICATION: Zolpidem Tartrate 10 MG SUBL   PHARMACY:  [facility]  IS THIS A 90 DAY SUPPLY : YES  IS PATIENT OUT OF MEDICATION: NO   IF NOT; HOW MUCH IS LEFT: 2 PILLS LEFT  LAST APPOINTMENT DATE: @6 /20/2018  NEXT APPOINTMENT DATE:@12 /20/2018  OTHER COMMENTS: Krystal Craig has not prescribed before, new req from prior physician.   **Let patient know to contact pharmacy at the end of the day to make sure medication is ready. **  ** Please notify patient to allow 48-72 hours to process**  **Encourage patient to contact the pharmacy for refills or they can request refills through [facility]**

## 2017-04-17 NOTE — Telephone Encounter (Signed)
Patient states trazodone kept her awake all night when she took it for the very first time.  She never took it again.  Please advise on refill for Ambien.

## 2017-04-17 NOTE — Telephone Encounter (Signed)
I have reached out to this patient regarding this request.  Last time she was seen by Dr. Juleen China, she was prescribed trazodone for sleep as an alternative to Ambien.  Waiting for patient's response to my message.

## 2017-04-19 ENCOUNTER — Encounter: Payer: Self-pay | Admitting: Family Medicine

## 2017-04-19 NOTE — Telephone Encounter (Signed)
Okay refill. 

## 2017-04-21 ENCOUNTER — Other Ambulatory Visit: Payer: Self-pay

## 2017-04-21 MED ORDER — ZOLPIDEM TARTRATE 10 MG SL SUBL: 1.0000 | SUBLINGUAL_TABLET | Freq: Every day | SUBLINGUAL | 0 refills | Status: DC

## 2017-04-21 MED ORDER — ZOLPIDEM TARTRATE 10 MG PO TABS: 10.0000 mg | ORAL_TABLET | Freq: Every evening | ORAL | 0 refills | Status: DC | PRN

## 2017-04-21 NOTE — Telephone Encounter (Signed)
Rx printed and faxed to patient's pharmacy. 

## 2017-04-29 ENCOUNTER — Ambulatory Visit (INDEPENDENT_AMBULATORY_CARE_PROVIDER_SITE_OTHER): Payer: Medicare Other | Admitting: Family Medicine

## 2017-04-29 VITALS — BP 158/68 | HR 74 | Temp 98.9°F | Ht 63.5 in | Wt 135.4 lb

## 2017-04-29 DIAGNOSIS — F329 Major depressive disorder, single episode, unspecified: Secondary | ICD-10-CM

## 2017-04-29 DIAGNOSIS — F419 Anxiety disorder, unspecified: Secondary | ICD-10-CM

## 2017-04-29 DIAGNOSIS — M159 Polyosteoarthritis, unspecified: Secondary | ICD-10-CM

## 2017-04-29 DIAGNOSIS — F5101 Primary insomnia: Secondary | ICD-10-CM

## 2017-04-29 DIAGNOSIS — E785 Hyperlipidemia, unspecified: Secondary | ICD-10-CM

## 2017-04-29 DIAGNOSIS — F32A Depression, unspecified: Secondary | ICD-10-CM

## 2017-04-29 MED ORDER — SIMVASTATIN 40 MG PO TABS: 40.0000 mg | ORAL_TABLET | Freq: Every day | ORAL | 3 refills | Status: DC

## 2017-04-29 MED ORDER — CLONAZEPAM 0.5 MG PO TABS: 0.5000 mg | ORAL_TABLET | Freq: Two times a day (BID) | ORAL | 1 refills | Status: DC | PRN

## 2017-04-29 MED ORDER — TRAMADOL HCL 50 MG PO TABS: 50.0000 mg | ORAL_TABLET | Freq: Four times a day (QID) | ORAL | 0 refills | Status: DC | PRN

## 2017-04-29 MED ORDER — ZOLPIDEM TARTRATE 10 MG PO TABS: 10.0000 mg | ORAL_TABLET | Freq: Every evening | ORAL | 2 refills | Status: DC | PRN

## 2017-04-29 NOTE — Progress Notes (Addendum)
Krystal Craig is a 83 y.o. female is here for follow up.  History of Present Illness:   HPI:   1. Anxiety and depression. She has the following anxiety symptoms: difficulty concentrating, racing thoughts. She denies current suicidal and homicidal ideation. Previous treatment includes medication benzodiazepines Klonopin.   She complains of the following medication side effects: none.   2. Primary insomnia. Failed Trazodone - "stayed up all night." Continues Ambien at half dose prn without issues. She understands the risks.    3. Osteoarthritis of multiple joints. Tramadol q 3 months refill. No changes. Tolerating well. Database reviewed.   4. Hyperlipidemia.   Is the patient taking medications without problems? [x]   YES  []   NO Does the patient complain of muscle aches?   []   YES  [x]    NO Trying to exercise on a regular basis? []   YES  [x]   NO Diet Compliance: compliant most of the time. Concerns: none. Cardiovascular ROS: no chest pain or dyspnea on exertion.   Lipids:    Component Value Date/Time   CHOL 171 01/26/2016   TRIG 193 (A) 01/26/2016   TRIG 191 (H) 04/24/2006 0834   HDL 58 01/26/2016   LDLDIRECT 113.0 04/05/2015 1113   VLDL 47.4 (H) 09/05/2014 0929   CHOLHDL 3 09/05/2014 0929     There are no preventive care reminders to display for this patient. Depression screen [facility] 2/9 10/02/2016 09/12/2014 09/07/2012  Decreased Interest 0 0 0  Down, Depressed, Hopeless 0 0 0  PHQ - 2 Score 0 0 0   PMHx, SurgHx, SocialHx, FamHx, Medications, and Allergies were reviewed in the Visit Navigator and updated as appropriate.   Patient Active Problem List   Diagnosis Date Noted  . UTI (urinary tract infection) 02/25/2017  . Diverticulosis 11/11/2016  . GERD with esophagitis 11/11/2016  . Hx of cancer of lung 11/11/2016  . Anxiety 11/06/2016  . Primary osteoarthritis of both first carpometacarpal joints 12/28/2015  . Anxiety and depression 04/05/2015  . Lumbar disc  herniation 01/05/2015  . Vaginal dryness 09/12/2014  . Microscopic hematuria 09/12/2014  . Psoriasis 02/28/2014  . CKD (chronic kidney disease), stage III ([place]) 02/28/2014  . Thoracic lymphadenopathy 09/25/2011  . Hyperlipidemia 09/25/2011  . Adenocarcinoma of lung ([place]) 08/03/2008  . History of recurrent UTIs 08/03/2008  . Osteopenia 08/03/2008  . Insomnia 08/03/2008  . Allergic rhinitis 06/26/2007  . GERD 06/26/2007  . Diaphragmatic hernia 06/26/2007  . Irritable bowel syndrome 06/26/2007  . Osteoarthritis 06/26/2007   Social History   Tobacco Use  . Smoking status: Former Smoker    Packs/day: 1.00    Years: 30.00    Pack years: 30.00    Types: Cigarettes    Last attempt to quit: 06/17/1984    Years since quitting: 32.8  . Smokeless tobacco: Never Used  Substance Use Topics  . Alcohol use: No    Alcohol/week: 0.0 oz  . Drug use: No   Current Medications and Allergies:   .  aspirin 81 MG tablet, Take 81 mg by mouth daily., Disp: , Rfl:  .  Calcium Carbonate-Vitamin D (CALCIUM-VITAMIN D) 500-200 MG-UNIT tablet, Take 1 tablet by mouth daily., Disp: , Rfl:  .  conjugated estrogens (PREMARIN) vaginal cream, Place 1 Applicatorful vaginally as needed., Disp: , Rfl:  .  metoCLOPramide (REGLAN) 10 MG tablet, Take 1 tablet (10 mg total) by mouth 4 (four) times daily., Disp: 40 tablet, Rfl: 0 .  Multiple Vitamins-Minerals (MULTIVITAMIN WITH MINERALS) tablet, Take 1  tablet by mouth daily., Disp: , Rfl:  .  polyethylene glycol (MIRALAX / GLYCOLAX) packet, Take 17 g by mouth daily., Disp: , Rfl:  .  simvastatin (ZOCOR) 40 MG tablet, Take 1 tablet (40 mg total) at bedtime by mouth., Disp: 90 tablet, Rfl: 3 .  traMADol (ULTRAM) 50 MG tablet, Take 1 tablet (50 mg total) every 6 (six) hours as needed by mouth., Disp: 270 tablet, Rfl: 0 .  clonazePAM (KLONOPIN) 0.5 MG tablet, Take 1 tablet (0.5 mg total) 2 (two) times daily as needed by mouth for anxiety., Disp: 60 tablet, Rfl: 1 .  zolpidem  (AMBIEN) 10 MG tablet, Take 1 tablet (10 mg total) at bedtime as needed by mouth for sleep., Disp: 30 tablet, Rfl: 2  Allergies  Allergen Reactions  . Ciprocin-Fluocin-Procin [Fluocinolone] Other (See Comments)    Leg and arm numbness  . Codeine     Chest pain/tightness  . Wellbutrin [Bupropion] Rash   Review of Systems   Pertinent items are noted in the HPI. Otherwise, ROS is negative.  Vitals:   Vitals:   04/29/17 0913  BP: (!) 158/68  Pulse: 74  Temp: 98.9 F (37.2 C)  TempSrc: Oral  SpO2: 96%  Weight: 135 lb 6.4 oz (61.4 kg)  Height: 5' 3.5" (1.613 m)     Body mass index is 23.61 kg/m.   Physical Exam:   Physical Exam  Constitutional: She appears well-nourished.  HENT:  Head: Normocephalic and atraumatic.  Eyes: EOM are normal. Pupils are equal, round, and reactive to light.  Neck: Normal range of motion. Neck supple.  Cardiovascular: Normal rate, regular rhythm, normal heart sounds and intact distal pulses.  Pulmonary/Chest: Effort normal.  Abdominal: Soft.  Skin: Skin is warm.  Psychiatric: She has a normal mood and affect. Her behavior is normal.  Nursing note and vitals reviewed.   Assessment and Plan:   Ben was seen today for anxiety.  Diagnoses and all orders for this visit:  Anxiety and depression Comments: Controlled with Klonopin 1-2 tabs daily prn. Not using every day. Tolerating well without oversedation. No falls.  Orders: -     clonazePAM (KLONOPIN) 0.5 MG tablet; Take 1 tablet (0.5 mg total) 2 (two) times daily as needed by mouth for anxiety. -     clonazePAM (KLONOPIN) 0.5 MG tablet; Take 1 tablet (0.5 mg total) 2 (two) times daily as needed by mouth for anxiety.  Primary insomnia Comments: Failed trial of Tramadol. Okay to continue Ambien - taking 1/2 tab nightly prn. Not taking every night. Orders: -     zolpidem (AMBIEN) 10 MG tablet; Take 1 tablet (10 mg total) at bedtime as needed by mouth for sleep.  Osteoarthritis of  multiple joints, unspecified osteoarthritis type Comments: Due for new Rx of Tramadol. Lasts 3 months. Reviewed risk versus benefits.  Orders: -     traMADol (ULTRAM) 50 MG tablet; Take 1 tablet (50 mg total) every 6 (six) hours as needed by mouth.  Hyperlipidemia, unspecified hyperlipidemia type Comments: Refill today. Orders: -     simvastatin (ZOCOR) 40 MG tablet; Take 1 tablet (40 mg total) at bedtime by mouth.   . Reviewed expectations re: course of current medical issues. . Discussed self-management of symptoms. . Outlined signs and symptoms indicating need for more acute intervention. . Patient verbalized understanding and all questions were answered. Marland Kitchen Health Maintenance issues including appropriate healthy diet, exercise, and smoking avoidance were discussed with patient. . See orders for this visit as documented in  the electronic medical record. . Patient received an After Visit Summary.  Briscoe Deutscher, DO , [facility] 04/30/2017  Future Appointments  Date Time Provider [place]  07/30/2017 10:00 AM Briscoe Deutscher, DO [facility] None  09/24/2017 10:30 AM Noralee Space, MD [facility] None  10/06/2017 11:00 AM Williemae Area, RN [facility] None

## 2017-04-30 ENCOUNTER — Encounter: Payer: Self-pay | Admitting: Family Medicine

## 2017-05-14 ENCOUNTER — Ambulatory Visit: Payer: Self-pay | Admitting: *Deleted

## 2017-05-14 NOTE — Telephone Encounter (Signed)
   Reason for Disposition . [1] MODERATE headache (e.g., interferes with normal activities) AND [2] present > 24 hours AND [3] unexplained  (Exceptions: analgesics not tried, typical migraine, or headache part of viral illness)  Answer Assessment - Initial Assessment Questions 1. LOCATION: "Where does it hurt?"      All over; mostly around eyes, forehead and ears 2. ONSET: "When did the headache start?" (Minutes, hours or days)      Monday a week ago 3. PATTERN: "Does the pain come and go, or has it been constant since it started?"     constant 4. SEVERITY: "How bad is the pain?" and "What does it keep you from doing?"  (e.g., Scale 1-10; mild, moderate, or severe)   - MILD (1-3): doesn't interfere with normal activities    - MODERATE (4-7): interferes with normal activities or awakens from sleep    - SEVERE (8-10): excruciating pain, unable to do any normal activities        5 out of 10 5. RECURRENT SYMPTOM: "Have you ever had headaches before?" If so, ask: "When was the last time?" and "What happened that time?"      no 6. CAUSE: "What do you think is causing the headache?"     Feels like sinus headache 7. MIGRAINE: "Have you been diagnosed with migraine headaches?" If so, ask: "Is this headache similar?"      no 8. HEAD INJURY: "Has there been any recent injury to the head?"      no 9. OTHER SYMPTOMS: "Do you have any other symptoms?" (fever, stiff neck, eye pain, sore throat, cold symptoms)     Sore throat ocasionally, eye pain and pressure under eyes10. PREGNANCY: "Is there any chance you are pregnant?" "When was your last menstrual period?"       no  Protocols used: HEADACHE-A-AH

## 2017-05-15 ENCOUNTER — Encounter: Payer: Self-pay | Admitting: Family Medicine

## 2017-05-15 ENCOUNTER — Ambulatory Visit: Payer: Medicare Other | Admitting: Family Medicine

## 2017-05-15 VITALS — BP 138/72 | HR 73 | Temp 97.9°F | Ht 63.5 in | Wt 132.6 lb

## 2017-05-15 DIAGNOSIS — J0100 Acute maxillary sinusitis, unspecified: Secondary | ICD-10-CM

## 2017-05-15 DIAGNOSIS — J40 Bronchitis, not specified as acute or chronic: Secondary | ICD-10-CM

## 2017-05-15 MED ORDER — AZITHROMYCIN 250 MG PO TABS: ORAL_TABLET | ORAL | 0 refills | Status: DC

## 2017-05-15 MED ORDER — PREDNISONE 5 MG PO TABS: ORAL_TABLET | ORAL | 0 refills | Status: DC

## 2017-05-15 NOTE — Progress Notes (Signed)
ZEFFIE BICKERT is a 83 y.o. female here for an acute visit.  History of Present Illness:   HPI:  Sinus Problem  This is a new problem. The current episode started 1 to 4 weeks ago. The problem has been gradually worsening since onset. The maximum temperature recorded prior to her arrival was 100.4 - 100.9 F. The fever has been present for 1 to 2 days. The pain is mild. Associated symptoms include chills, coughing, a hoarse voice, sinus pressure, a sore throat and swollen glands. Past treatments include nothing.   PMHx, SurgHx, SocialHx, Medications, and Allergies were reviewed in the Visit Navigator and updated as appropriate.  Current Medications:   Current Outpatient Medications:  .  acetaminophen (TYLENOL ARTHRITIS PAIN) 650 MG CR tablet, Take 650 mg by mouth every 8 (eight) hours as needed for pain., Disp: , Rfl:  .  aspirin 81 MG tablet, Take 81 mg by mouth daily., Disp: , Rfl:  .  Calcium Carbonate-Vitamin D (CALCIUM-VITAMIN D) 500-200 MG-UNIT tablet, Take 1 tablet by mouth daily., Disp: , Rfl:  .  clonazePAM (KLONOPIN) 0.5 MG tablet, Take 1 tablet (0.5 mg total) 2 (two) times daily as needed by mouth for anxiety., Disp: 60 tablet, Rfl: 1 .  clonazePAM (KLONOPIN) 0.5 MG tablet, Take 1 tablet (0.5 mg total) 2 (two) times daily as needed by mouth for anxiety., Disp: 60 tablet, Rfl: 1 .  conjugated estrogens (PREMARIN) vaginal cream, Place 1 Applicatorful vaginally as needed., Disp: , Rfl:  .  metoCLOPramide (REGLAN) 10 MG tablet, Take 1 tablet (10 mg total) by mouth 4 (four) times daily., Disp: 40 tablet, Rfl: 0 .  Multiple Vitamins-Minerals (MULTIVITAMIN WITH MINERALS) tablet, Take 1 tablet by mouth daily., Disp: , Rfl:  .  polyethylene glycol (MIRALAX / GLYCOLAX) packet, Take 17 g by mouth daily., Disp: , Rfl:  .  simvastatin (ZOCOR) 40 MG tablet, Take 1 tablet (40 mg total) at bedtime by mouth., Disp: 90 tablet, Rfl: 3 .  traMADol (ULTRAM) 50 MG tablet, Take 1 tablet (50 mg  total) every 6 (six) hours as needed by mouth., Disp: 270 tablet, Rfl: 0 .  zolpidem (AMBIEN) 10 MG tablet, Take 1 tablet (10 mg total) at bedtime as needed by mouth for sleep., Disp: 30 tablet, Rfl: 2  Allergies  Allergen Reactions  . Ciprocin-Fluocin-Procin [Fluocinolone] Other (See Comments)    Leg and arm numbness  . Codeine     Chest pain/tightness  . Wellbutrin [Bupropion] Rash   Review of Systems:   Pertinent items are noted in the HPI. Otherwise, ROS is negative.  Vitals:   Vitals:   05/15/17 1103  BP: 138/72  Pulse: 73  Temp: 97.9 F (36.6 C)  TempSrc: Oral  SpO2: 94%  Weight: 132 lb 9.6 oz (60.1 kg)  Height: 5' 3.5" (1.613 m)     Body mass index is 23.12 kg/m.   Physical Exam:   Physical Exam  Constitutional: She appears well-nourished.  HENT:  Head: Normocephalic and atraumatic.  Right Ear: Tympanic membrane normal.  Left Ear: Tympanic membrane is injected.  Nose: Mucosal edema present. Right sinus exhibits maxillary sinus tenderness. Left sinus exhibits maxillary sinus tenderness.  Mouth/Throat: Posterior oropharyngeal edema and posterior oropharyngeal erythema present.  Eyes: EOM are normal. Pupils are equal, round, and reactive to light.  Neck: Normal range of motion. Neck supple.  Cardiovascular: Normal rate, regular rhythm, normal heart sounds and intact distal pulses.  Pulmonary/Chest: Effort normal.  Abdominal: Soft.  Skin: Skin is  warm.  Psychiatric: She has a normal mood and affect. Her behavior is normal.  Nursing note and vitals reviewed.   Assessment and Plan:   Diagnoses and all orders for this visit:  Subacute maxillary sinusitis -     azithromycin (ZITHROMAX) 250 MG tablet; Please take two tablets first day and one tablet daily until finished. -     predniSONE (DELTASONE) 5 MG tablet; Please take 6,5,4,3,2,1 until finished.  Bronchitis   . Reviewed expectations re: course of current medical issues. . Discussed self-management of  symptoms. . Outlined signs and symptoms indicating need for more acute intervention. . Patient verbalized understanding and all questions were answered. Marland Kitchen Health Maintenance issues including appropriate healthy diet, exercise, and smoking avoidance were discussed with patient. . See orders for this visit as documented in the electronic medical record. . Patient received an After Visit Summary.  Briscoe Deutscher, DO [place], [facility] 05/15/2017  Future Appointments  Date Time Provider [place]  07/30/2017 10:00 AM Briscoe Deutscher, DO [facility]  09/24/2017 10:30 AM Noralee Space, MD [facility] None  10/06/2017 11:00 AM Williemae Area, RN [facility]

## 2017-05-19 ENCOUNTER — Encounter: Payer: Self-pay | Admitting: Family Medicine

## 2017-05-19 ENCOUNTER — Telehealth: Payer: Self-pay | Admitting: Family Medicine

## 2017-05-19 ENCOUNTER — Other Ambulatory Visit: Payer: Self-pay | Admitting: Surgical

## 2017-05-19 DIAGNOSIS — F5101 Primary insomnia: Secondary | ICD-10-CM

## 2017-05-19 MED ORDER — ZOLPIDEM TARTRATE 10 MG PO TABS: 10.0000 mg | ORAL_TABLET | Freq: Every evening | ORAL | 2 refills | Status: DC | PRN

## 2017-05-19 NOTE — Telephone Encounter (Signed)
Patient calling back, would like it sent to [facility] # [phone]

## 2017-05-19 NOTE — Telephone Encounter (Signed)
Copied from [place]. Topic: Quick Communication - See Telephone Encounter >> May 19, 2017  8:30 AM Burnis Medin, NT wrote: CRM for notification. See Telephone encounter for: Pt is calling because the zolpidem (AMBIEN) 10 MG tablet is not working and wanted to see if the doctor could send a prescription for [employer]. Pt uses [facility] on [address]. Pt would like a call back.  05/19/17.

## 2017-05-19 NOTE — Telephone Encounter (Signed)
Prescription has been sent to the pharmacy and patient notified.

## 2017-05-19 NOTE — Telephone Encounter (Signed)
Pt. Is requesting her Ambien come from "[employer]. She states it "works better." Instructed her to call her pharmacy about this and if the need something from the provider, to call us back. Verbalizes understanding.

## 2017-06-05 ENCOUNTER — Ambulatory Visit: Payer: Medicare Other | Admitting: Family Medicine

## 2017-07-30 ENCOUNTER — Ambulatory Visit: Payer: Medicare Other | Admitting: Family Medicine

## 2017-07-30 ENCOUNTER — Encounter: Payer: Self-pay | Admitting: Family Medicine

## 2017-07-30 VITALS — BP 130/82 | HR 73 | Temp 98.6°F | Wt 136.8 lb

## 2017-07-30 DIAGNOSIS — F419 Anxiety disorder, unspecified: Secondary | ICD-10-CM

## 2017-07-30 DIAGNOSIS — R739 Hyperglycemia, unspecified: Secondary | ICD-10-CM | POA: Diagnosis not present

## 2017-07-30 DIAGNOSIS — N183 Chronic kidney disease, stage 3 unspecified: Secondary | ICD-10-CM

## 2017-07-30 DIAGNOSIS — F5101 Primary insomnia: Secondary | ICD-10-CM | POA: Diagnosis not present

## 2017-07-30 DIAGNOSIS — E782 Mixed hyperlipidemia: Secondary | ICD-10-CM

## 2017-07-30 DIAGNOSIS — M159 Polyosteoarthritis, unspecified: Secondary | ICD-10-CM | POA: Diagnosis not present

## 2017-07-30 DIAGNOSIS — F329 Major depressive disorder, single episode, unspecified: Secondary | ICD-10-CM | POA: Diagnosis not present

## 2017-07-30 DIAGNOSIS — F32A Depression, unspecified: Secondary | ICD-10-CM

## 2017-07-30 MED ORDER — TRAMADOL HCL 50 MG PO TABS: 50.0000 mg | ORAL_TABLET | Freq: Four times a day (QID) | ORAL | 2 refills | Status: DC | PRN

## 2017-07-30 MED ORDER — ZOLPIDEM TARTRATE 10 MG PO TABS: 10.0000 mg | ORAL_TABLET | Freq: Every evening | ORAL | 2 refills | Status: DC | PRN

## 2017-07-30 NOTE — Progress Notes (Signed)
Krystal Craig is a 84 y.o. female is here for follow up.  History of Present Illness:   HPI: See Assessment and Plan section for Problem Based Charting of issues discussed today.  There are no preventive care reminders to display for this patient. Depression screen [facility] 2/9 10/02/2016 09/12/2014 09/07/2012  Decreased Interest 0 0 0  Down, Depressed, Hopeless 0 0 0  PHQ - 2 Score 0 0 0   PMHx, SurgHx, SocialHx, FamHx, Medications, and Allergies were reviewed in the Visit Navigator and updated as appropriate.   Patient Active Problem List   Diagnosis Date Noted  . UTI (urinary tract infection) 02/25/2017  . Diverticulosis 11/11/2016  . GERD with esophagitis 11/11/2016  . Hx of cancer of lung 11/11/2016  . Anxiety 11/06/2016  . Primary osteoarthritis of both first carpometacarpal joints 12/28/2015  . Anxiety and depression 04/05/2015  . Lumbar disc herniation 01/05/2015  . Vaginal dryness 09/12/2014  . Microscopic hematuria 09/12/2014  . Psoriasis 02/28/2014  . CKD (chronic kidney disease), stage III ([place]) 02/28/2014  . Thoracic lymphadenopathy 09/25/2011  . Hyperlipidemia 09/25/2011  . Adenocarcinoma of lung ([place]) 08/03/2008  . History of recurrent UTIs 08/03/2008  . Osteopenia 08/03/2008  . Insomnia 08/03/2008  . Allergic rhinitis 06/26/2007  . GERD 06/26/2007  . Diaphragmatic hernia 06/26/2007  . Irritable bowel syndrome 06/26/2007  . Osteoarthritis 06/26/2007   Social History   Tobacco Use  . Smoking status: Former Smoker    Packs/day: 1.00    Years: 30.00    Pack years: 30.00    Types: Cigarettes    Last attempt to quit: 06/17/1984    Years since quitting: 33.1  . Smokeless tobacco: Never Used  Substance Use Topics  . Alcohol use: No    Alcohol/week: 0.0 oz  . Drug use: No   Current Medications and Allergies:   .  acetaminophen (TYLENOL ARTHRITIS PAIN) 650 MG CR tablet, Take 650 mg by mouth every 8 (eight) hours as needed for pain., Disp: , Rfl:  .   aspirin 81 MG tablet, Take 81 mg by mouth daily., Disp: , Rfl:  .  azithromycin (ZITHROMAX) 250 MG tablet, Please take two tablets first day and one tablet daily until finished., Disp: 6 tablet, Rfl: 0 .  Calcium Carbonate-Vitamin D (CALCIUM-VITAMIN D) 500-200 MG-UNIT tablet, Take 1 tablet by mouth daily., Disp: , Rfl:  .  conjugated estrogens (PREMARIN) vaginal cream, Place 1 Applicatorful vaginally as needed., Disp: , Rfl:  .  Cranberry 1000 MG CAPS, Take by mouth., Disp: , Rfl:  .  metoCLOPramide (REGLAN) 10 MG tablet, Take 1 tablet (10 mg total) by mouth 4 (four) times daily., Disp: 40 tablet, Rfl: 0 .  Multiple Vitamins-Minerals (MULTIVITAMIN WITH MINERALS) tablet, Take 1 tablet by mouth daily., Disp: , Rfl:  .  polyethylene glycol (MIRALAX / GLYCOLAX) packet, Take 17 g by mouth daily., Disp: , Rfl:  .  predniSONE (DELTASONE) 5 MG tablet, Please take 6,5,4,3,2,1 until finished., Disp: 21 tablet, Rfl: 0 .  Probiotic Product (PROBIOTIC-10) CAPS, Take by mouth., Disp: , Rfl:  .  simvastatin (ZOCOR) 40 MG tablet, Take 1 tablet (40 mg total) at bedtime by mouth., Disp: 90 tablet, Rfl: 3 .  traMADol (ULTRAM) 50 MG tablet, Take 1 tablet (50 mg total) by mouth every 6 (six) hours as needed., Disp: 90 tablet, Rfl: 2 .  clonazePAM (KLONOPIN) 0.5 MG tablet, Take 1 tablet (0.5 mg total) 2 (two) times daily as needed by mouth for anxiety., Disp: 60  tablet, Rfl: 1 .  zolpidem (AMBIEN) 10 MG tablet, Take 1 tablet (10 mg total) by mouth at bedtime as needed for sleep., Disp: 30 tablet, Rfl: 2   Allergies  Allergen Reactions  . Ciprocin-Fluocin-Procin [Fluocinolone] Other (See Comments)    Leg and arm numbness  . Codeine     Chest pain/tightness  . Wellbutrin [Bupropion] Rash   Review of Systems   Pertinent items are noted in the HPI. Otherwise, ROS is negative.  Vitals:   Vitals:   07/30/17 0950  BP: 130/82  Pulse: 73  Temp: 98.6 F (37 C)  TempSrc: Oral  SpO2: 96%  Weight: 136 lb 12.8 oz  (62.1 kg)     Body mass index is 23.85 kg/m.  Physical Exam:   Physical Exam  Constitutional: She appears well-nourished.  HENT:  Head: Normocephalic and atraumatic.  Eyes: EOM are normal. Pupils are equal, round, and reactive to light.  Neck: Normal range of motion. Neck supple.  Cardiovascular: Normal rate, regular rhythm, normal heart sounds and intact distal pulses.  Pulmonary/Chest: Effort normal.  Abdominal: Soft.  Skin: Skin is warm.  Psychiatric: She has a normal mood and affect. Her behavior is normal.  Nursing note and vitals reviewed.   Assessment and Plan:   1. Osteoarthritis of multiple joints, unspecified osteoarthritis type Well controlled.  No signs of complications, medication side effects, or red flags.  Continue current regimen.    - traMADol (ULTRAM) 50 MG tablet; Take 1 tablet (50 mg total) by mouth every 6 (six) hours as needed.  Dispense: 90 tablet; Refill: 2  2. Hyperglycemia - Hemoglobin A1c; Future  3. Primary insomnia Well controlled.  No signs of complications, medication side effects, or red flags.  Continue current regimen.    - zolpidem (AMBIEN) 10 MG tablet; Take 1 tablet (10 mg total) by mouth at bedtime as needed for sleep.  Dispense: 30 tablet; Refill: 2  4. Mixed hyperlipidemia - Lipid panel; Future  5. CKD (chronic kidney disease), stage III ([facility]) - CBC with Differential/Platelet; Future - Comprehensive metabolic panel; Future  6. Anxiety and depression Well controlled.  No signs of complications, medication side effects, or red flags.  Continue current regimen.    . Reviewed expectations re: course of current medical issues. . Discussed self-management of symptoms. . Outlined signs and symptoms indicating need for more acute intervention. . Patient verbalized understanding and all questions were answered. Marland Kitchen Health Maintenance issues including appropriate healthy diet, exercise, and smoking avoidance were discussed with  patient. . See orders for this visit as documented in the electronic medical record. . Patient received an After Visit Summary.   Briscoe Deutscher, DO [place], [facility] 07/30/2017

## 2017-08-23 ENCOUNTER — Other Ambulatory Visit: Payer: Self-pay | Admitting: Family Medicine

## 2017-08-23 DIAGNOSIS — F5101 Primary insomnia: Secondary | ICD-10-CM

## 2017-09-24 ENCOUNTER — Ambulatory Visit: Payer: Medicare Other | Admitting: Pulmonary Disease

## 2017-09-24 ENCOUNTER — Encounter: Payer: Self-pay | Admitting: Family Medicine

## 2017-09-24 ENCOUNTER — Ambulatory Visit (INDEPENDENT_AMBULATORY_CARE_PROVIDER_SITE_OTHER)
Admission: RE | Admit: 2017-09-24 | Discharge: 2017-09-24 | Disposition: A | Discharge status: Home / Self Care | Payer: Medicare Other | Source: Ambulatory Visit | Attending: Pulmonary Disease | Admitting: Pulmonary Disease

## 2017-09-24 ENCOUNTER — Encounter: Payer: Self-pay | Admitting: Pulmonary Disease

## 2017-09-24 VITALS — BP 128/72 | HR 63 | Temp 98.0°F | Ht 63.5 in | Wt 136.0 lb

## 2017-09-24 DIAGNOSIS — G4700 Insomnia, unspecified: Secondary | ICD-10-CM

## 2017-09-24 DIAGNOSIS — K2100 Gastro-esophageal reflux disease with esophagitis, without bleeding: Secondary | ICD-10-CM

## 2017-09-24 DIAGNOSIS — F329 Major depressive disorder, single episode, unspecified: Secondary | ICD-10-CM

## 2017-09-24 DIAGNOSIS — F32A Depression, unspecified: Secondary | ICD-10-CM

## 2017-09-24 DIAGNOSIS — M150 Primary generalized (osteo)arthritis: Secondary | ICD-10-CM

## 2017-09-24 DIAGNOSIS — C3492 Malignant neoplasm of unspecified part of left bronchus or lung: Secondary | ICD-10-CM

## 2017-09-24 DIAGNOSIS — F419 Anxiety disorder, unspecified: Secondary | ICD-10-CM

## 2017-09-24 DIAGNOSIS — K589 Irritable bowel syndrome without diarrhea: Secondary | ICD-10-CM

## 2017-09-24 DIAGNOSIS — M159 Polyosteoarthritis, unspecified: Secondary | ICD-10-CM

## 2017-09-24 DIAGNOSIS — M8949 Other hypertrophic osteoarthropathy, multiple sites: Secondary | ICD-10-CM

## 2017-09-24 DIAGNOSIS — J302 Other seasonal allergic rhinitis: Secondary | ICD-10-CM

## 2017-09-24 DIAGNOSIS — K5790 Diverticulosis of intestine, part unspecified, without perforation or abscess without bleeding: Secondary | ICD-10-CM

## 2017-09-24 NOTE — Progress Notes (Signed)
Subjective:     Patient ID: Krystal Krystal Craig, female   DOB: 05/27/1933, 84 y.o.   MRN: [id number]  HPI 84 y/o WF here for a follow up visit... she has multiple medical problems as noted below...  ~  SEE PREV EPIC NOTES FOR OLDER DATA >>    CXR 2/13 showed heart size at upper limit, calcif tort Ao, s/p surg on left w/ hyperinflation, scarring left base, NAD...  LABS 2/13:  FLP- at goals on Simva40 x TG212;  Chems- wnl;  CBC- wnl;  TSH=2.06;  VitD=54;  UA- clear  ~  September 07, 2012:  72mo ROV & Krystal Krystal Craig reports that Krystal Krystal Craig has diagnosed her w/ Psoriasis on her legs and started MTX rx...    AR, Hx bronchitis> she uses OTC antihist etc; denies recurrent bronchitic infections w/o cough, sput, hemop, SOB, etc...    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    Hx Palpit> On ASA81; she notes rare palpit, self limited- she knows to avoid caffeine etc; no CP, dizziness, syncope,etc...    Hyperlipid> On Simva40;  FLP 2/14 showed TChol 172, TG 213, HDL 44, LDL 92; advised better low fat diet...    HH, GERD> On Prilosec OTC as needed & states she hasn't needed it; denies heartburn, reflux, dysphagia, abd pain, n/v, etc...    Divertics, IBS> on Miralax; she denies abd discomfort, d/c/ blood seen, etc; she will be due for 4yr f/u Krystal 3/15    Hx recurrent UTIs> none since Krystal Krystal Craig did her cystocele, rectocele, & sling surg 10/08; followed by Krystal Krystal Craig now on Premarin0.625...    DJD, LBP> On Tramadol prn; she manages well, get plenty of exercise, still sees Chiropractor Qmo...    Osteopenia> On Calcium, MVI, VitD; BMDs per gyn- "Krystal Krystal Craig said to just watch it"...    Psoriasis> on MTX2.5-3tabs, 3xper wk;  We reviewed prob list, meds, xrays and labs> see below for updates >> she had the 2013 Flu vaccine 11/13...  LABS 2/14:  FLP- ok x TG=213 on Simva40;  Chems- wnl;  CBC- wnl;  TSH=1.63;   VitD=55...  CXR 3/14 showed normal heart size, some scarring at left base chronic, postop changes in left hilum, NAD...  ~  September 08, 2013:  Yearly Krystal Krystal Craig notes some right ear pressure, discomfort, and wonders about an infection; Exam showed a soft tissue mass occluding the EAC & we will refer to ENT for further eval & Rx... She also reports a "burning tongue" & went to 5 doctors, sent to [facility], no real answer but she chews gum & this helps... We reviewed the following medical problems during today's office visit >>     AR, Hx bronchitis> she uses OTC antihist etc; denies recurrent bronchitic infections w/o cough, sput, hemop, SOB, etc...    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    Hx Palpit> On ASA81; she notes rare palpit, self limited- she knows to avoid caffeine etc; no CP, dizziness, syncope,etc...    Hyperlipid> On Simva40;  FLP 3/15 showed TChol 150, TG 234, HDL 47, LDL 56; advised better low fat diet...    HH, GERD> On Prilosec OTC as needed; denies heartburn, reflux, dysphagia, abd pain, n/v, etc...    Divertics, IBS> on Miralax; she denies abd  discomfort, d/c/ blood seen, etc; she will be due for 70yr f/u Krystal 3/15    Hx recurrent UTIs> none since Krystal Krystal Craig did her cystocele, rectocele, & sling surg 10/08; followed by Krystal Krystal Craig now on Premarin0.625...    DJD, LBP> On Tramadol prn; she manages well, get plenty of exercise, still sees Chiropractor Qmo...    Osteopenia> On Calcium, MVI, VitD; BMDs per gyn- "Krystal Krystal Craig said to just watch it"...    Psoriasis> off  MTX & treated w/ creams by Krystal Krystal Craig... We reviewed prob list, meds, xrays and labs> see below for updates >> she had the 2014 flu vaccine 10/14...   LABS 3/15:  FLP- ok on Simva40 x TG=234;  Chems- wnl;  CBC- wnl;  TSH=1.93;  VitD=74...   CXR 3/15 showed norm heart size, post op changes on left, clear lungs,  NAD.Marland Kitchen.   ~  September 20, 2014:  22yr Krystal Krystal Craig reports a good yr- notes breathing is good & denies cough, sput, hemoptysis, SOB, CP; she remains active & is most limited by back discomfort;  She had Ear surg by Krystal Krystal Craig to relieve EAC blockage from a boney growth- fully recovered & improved;  She has been followed for primary care by Krystal Krystal Craig at [facility] 3/16 & his note is reviewed;  She also sees Krystal Krystal Craig for GYN...    AR, Hx bronchitis> she uses OTC antihist etc; denies recurrent bronchitic infections w/o cough, sput, hemop, SOB, etc... Not requiring breathing meds.    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 We reviewed prob list, meds, xrays and labs> see below for updates >>   CXR 08/2014 showed norm heart size, post op changes on left w/ scarring, otherw clear/ NAD...   LABS 3/16 per Krystal Krystal Craig reviewed> OK x elev TG=237...  ~  September 21, 2015:  54yr ROV & pulmonary follow up visit> Krystal Krystal Craig notes that her breathing is stable & doing well; she reports a sl cough on exposure to cleaning sprays but she does well if she wears a mask... She had LBP, diagnosed w/ a herniated disc & had Lumbar Laminectomy & decompression 12/2014 by Krystal Krystal Craig; after that she reports +HPylori & "I got down", couldn't cope (husb has dementia) and was Hosp [facility]) after ER visit w/ anxiety & depression; placed on Wellbutrin which helped but caused a rash so it was stopped, and she reports back to her baseline 7 doing satis now...  She sees Krystal Krystal Craig for PCP & had an ILI 06/2015- symptomatic treatment & resolved; she had her annual GYN check w/ Krystal Krystal Craig 04/2015- treated for cystitis...    AR, Hx bronchitis> she uses OTC antihist etc; denies recurrent bronchitic infections w/o cough, sput, hemop, SOB, etc... Not requiring breathing meds.    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 EXAM shows Afeb, VSS, O2sat=98% on RA;  HEENT- neg, mallampati2;  Chest- clear w/o w/r/r;  Heart- RR w/o m/r/g;  Abd- soft, nontender, wnl;  Ext- neg w/o c/c/e;  Neuro- intact...  CXR 09/21/15>  Norm heart size, atherosclerotic calcif in Ao, s/p left lung surg, clear lungs/ NAD...  IMP/PLAN>>  Krystal Krystal Craig had a difficult yr in 2016 w/ LLam, anxiety/depression. Etc;  She is back to baseline now she says & feeling well, no resp complaints;  CXR is clear/  NAD & we will continue to follow up yearly, sooner if needed for any breathing problems...  ~  September 23, 2016:  75yr ROV & pulmonary recheck> her PCP now is Dr. Jonathon Jordan;  She's been recently treated by GYN, the ER, for Cystitis/ UTI... We follow her for hx Lung Cancer- s/p LLLobectomy  2006 for an [place];  She reports breathing well- denies cough/ sput/ hemoptysis/ SOB/ CP, etc;  She notes chr stable DOE w/ rushing, stairs, etc; she is ok w/ ADLs but not exercising due to back pain;  She is due for f/u CXR and requests refills of Tramadol, Zolpidem=> OK...  We reviewed the following medical problems during today's office visit>      AR, Hx bronchitis> she uses OTC antihist etc; denies recurrent bronchitic infections w/o cough, sput, hemop, SOB, etc... Not requiring breathing meds.    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 EXAM shows Afeb, VSS, O2sat=98% on RA;  HEENT- neg, mallampati2;  Chest- clear w/o w/r/r;  Heart- RR w/o m/r/g;  Abd- soft, nontender, wnl;  Ext- neg w/o c/c/e;  Neuro- intact...  CXR 09/23/16>  Norm heart size, aortic atherosclerosis, blunt left angle w/o change, surg clips left hilum &atrach region- stable, NAD...  IMP/PLAN>>  Krystal Krystal Craig remains stable at 83, no signs of recurrent or metastatic cancer; reminded to stay active, incr  exercise program, etc; we refilled Tramadol & Zolpidem per request...     ~  September 24, 2017:  45yr ROV & pulmonary follow up visit>                PROBLEM LIST:    ALLERGIC RHINITIS (ICD-477.9) - she uses OTC Zyrtek Prn> advised sinus regimen w/ Antihist Qam, Nasal saline mist every 1-2H during the day, Mucinex 1-2 Bid w/ fluids, & Flonase Qhs...  Hx of BRONCHITIS (ICD-490) - no recent URI's or bronchitic infections... she denies cough, sputum, hemoptysis, worsening dyspnea,  wheezing, chest pains, snoring, daytime hypersomnolence, etc...   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 underwent LLLobectomy via VATS & minithoracotomy by Krystal Krystal Craig 6/06 w/ 1.5cm BRONCHOALVEOLAR CELL CANCER & neg nodes... she saw Krystal Krystal Craig post-op, no adjuvant therapy recommended... she's been followed regularly by Krystal Krystal Craig since surg Q62mo w/ CXR, alt w/ CT scans... ~  CXR 3/09 by Krystal Krystal Craig- no evid of recurrence, left pleural thickening, otherw neg... ~  CT Chest 9/09 showed post-op changes, precarinal LN w/o change, 51mm nodule ant left lung w/o change, scar RML w/o change, thoracic spondy- NAD.Marland Kitchen. ~  CXR 3/10 showed vol loss on left w/ blunt left angle, NAD. ~  CT Chest 10/10 showed clear lungs w/o nodules or infiltrates, stable precarinal LN. ~  CXR 4/11 revealed post op changes, NAD.Marland KitchenMarland Kitchen ~  CT Chest 11/11 showed postop changes, NAD.Marland Kitchen. ~  CXR 2/13 showed heart size at upper limit, calcif tort Ao, s/p surg on left w/ hyperinflation, scarring left base, NAD.Marland KitchenMarland Kitchen  ~  CT Angio Chest 4/13 showed neg for PE, bibasilar atx, one prom periaortic node, divertics in Krystal... ~  CXR 3/14 showed normal heart size, some scarring at left base chronic, postop changes in left hilum, NAD. ~  CXR 3/15 showed norm heart size, post op changes on left, clear lungs, NAD.Marland Kitchen. ~  CXR 08/2014 showed norm heart size, post op changes on left w/ scarring, otherw clear/ NAD.Marland KitchenMarland Kitchen ~  CXR 09/21/15 showed norm  heart size, atherosclerotic calcif in Ao, s/p left lung surg, clear lungs/ NAD  Hx Chest Pain >> see 4/13 Hosp eval... PALPITATIONS, HX OF (ICD-V12.50) - she takes ASA 81mg /d... notes rare palpit, no CP/ syncope/ dyspnea, edema, etc... ~  NuclearStressTest 12/05 was neg- no scar or ischemia, EF= 70%... ~  EKG 4/13 showed NSR, rate71, wnl, NAD...  HYPERCHOLESTEROLEMIA (ICD-272.0) - on SIMVASTATIN 40mg /d + low fat diet... ~  [place] 11/07 showed TChol 181, TG -, HDL 48, LDL 95 ~  FLP 1/09 showed TChol 162, TG 198, HDL 43, LDL 80 ~  FLP 2/10 showed TChol 132, TG 144, HDL 41, LDL 62 ~  FLP 2/11 showed TChol 167, TG 271, HDL 57, LDL 90... rec> better low fat diet. ~  FLP 2/12 showed TChol 173, TG 165, HDL 56, LDL 84 ~  FLP 2/13 on Simva40 showed TChol 184, TG 212, HDL 56, LDL 100  ~  FLP 2/14 on Simva40 showed TChol 172, TG 213, HDL 44, LDL 92  ~  FLP 3/15 on Simva40 showed TChol 150, TG 234, HDL 47, LDL 56   HIATAL HERNIA (ICD-553.3), & GERD (ICD-530.81) - she states she is doing fine and does not require PPI therapy... 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 ~  2/11: she notes using occas Mylanta & advised to use Prilosec Prn.  DIVERTICULOSIS OF Krystal (ICD-562.10), & IRRITABLE BOWEL SYNDROME (ICD-564.1) - last colonoscopy 3/05 by DrSamLeB showed tortuous Krystal, divertics, otherw neg... ~  3/15: she is due for 10 yr f/u colonoscopy &we will refer to GI...   Hx of UTI'S, RECURRENT (ICD-599.0) - she had recurrent UTI's leading Krystal Krystal Craig to refer her to [place] for surg w/ cystocele/ rectocele repairs & sling 10/08... ~  She denies recurrent UTIs & is now followed by Krystal Krystal Craig...  OSTEOARTHRITIS (ICD-715.90) - she has had trigger fingers and several injections by Krystal Krystal Craig... ~  s/p bilat shoulder operations for impingement syndromes in 2001 by Krystal Krystal Craig... ~  She takes TRAMADOL 50mg  prn pain...  BACK PAIN, LUMBAR (ICD-724.2)  - she still sees her chiropractor every month for LBP adjustments...  OSTEOPENIA (ICD-733.90) - she states that BMD's are done by K[facility] Krystal Craig et al and she was prev on Boniva but stopped this in 2010 due to "burning tongue" & ultimately has dental consult at [facility] w/ rec to stop the Bisphos Rx (symptoms did abate somewhat)... she takes Calcium, MVI, VitD, & Krystal Krystal Craig has her on Premarin Vag cream weekly... ~  BMD at [facility] 5/08 showed TScores +0.1 in Spine, and -2.0 in left FemNeck (sl worse than 2006) ~  BMD in 8/10 at [facility] showed [place] ?Spine, and -1.9 in FemNeck (sl improved from 2008) ~  Osteopenia followed by GYN & they do her BMDs; she tells me that Krystal Krystal Craig said to "just watch it" since she is INTOL to Bisphos rx.  INSOMNIA (ICD-780.52) - prev on Ambien Prn but she indicates that she rests well now w/o need for meds...  Health Maintenance: ~  GYN:  she has a new GYN= Krystal Krystal Craig on Premarin cream weekly...   Past Surgical History:  Procedure Laterality Date  . ABDOMINAL HYSTERECTOMY  1969  . APPENDECTOMY    . bilateral cataract surgery  2012   Dr. Talbert Forest  . bilateral shouler operations for impingement syndromes  2001   Dr. Eddie Dibbles  . BUNIONECTOMY Left   . CHOLECYSTECTOMY  1976  . LAPAROSCOPIC  LYSIS INTESTINAL ADHESIONS    . left lower lobectomy  11/2004   via VATS and mini-thoractomy for 1.5cm adenoca of lung by Dr. Arlyce Dice  . LUMBAR LAMINECTOMY/DECOMPRESSION MICRODISCECTOMY Left 01/05/2015   Procedure: L3-4 DECOMPRESSION MICRODISCECTOMY ON LEFT;  Surgeon: Melina Schools, MD;  Location: [place];  Service: Orthopedics;  Laterality: Left;  left Lumbar 3-4  . turmor ear Right 09/2013  . urologic sugery with cystocele/rectocele repairs and sling  03/2007   Dr. Terance Hart    Outpatient Encounter Medications as of 09/24/2017  Medication Sig  . acetaminophen (TYLENOL ARTHRITIS PAIN) 650 MG CR tablet Take 650 mg by mouth every 8 (eight) hours as needed for pain.  Marland Kitchen aspirin 81 MG tablet Take 81 mg by mouth  daily.  . Calcium Carbonate-Vitamin D (CALCIUM-VITAMIN D) 500-200 MG-UNIT tablet Take 1 tablet by mouth daily.  Marland Kitchen conjugated estrogens (PREMARIN) vaginal cream Place 1 Applicatorful vaginally as needed.  . Cranberry 1000 MG CAPS Take by mouth.  . Multiple Vitamins-Minerals (MULTIVITAMIN WITH MINERALS) tablet Take 1 tablet by mouth daily.  . polyethylene glycol (MIRALAX / GLYCOLAX) packet Take 17 g by mouth daily.  . Probiotic Product (PROBIOTIC-10) CAPS Take by mouth.  . simvastatin (ZOCOR) 40 MG tablet Take 1 tablet (40 mg total) at bedtime by mouth.  . traMADol (ULTRAM) 50 MG tablet Take 1 tablet (50 mg total) by mouth every 6 (six) hours as needed.  . [DISCONTINUED] azithromycin (ZITHROMAX) 250 MG tablet Please take two tablets first day and one tablet daily until finished.  . [DISCONTINUED] predniSONE (DELTASONE) 5 MG tablet Please take 6,5,4,3,2,1 until finished.  . clonazePAM (KLONOPIN) 0.5 MG tablet Take 1 tablet (0.5 mg total) 2 (two) times daily as needed by mouth for anxiety.  . metoCLOPramide (REGLAN) 10 MG tablet Take 1 tablet (10 mg total) by mouth 4 (four) times daily. (Patient not taking: Reported on 09/24/2017)  . zolpidem (AMBIEN) 10 MG tablet Take 1 tablet (10 mg total) by mouth at bedtime as needed for sleep.   No facility-administered encounter medications on file as of 09/24/2017.     Allergies  Allergen Reactions  . Ciprocin-Fluocin-Procin [Fluocinolone] Other (See Comments)    Leg and arm numbness  . Codeine     Chest pain/tightness  . Wellbutrin [Bupropion] Rash    Current Medications, Allergies, Past Medical History, Past Surgical History, Family History, and Social History were reviewed in [employer] record.     Review of Systems         See HPI - all other systems neg except as noted...  The patient denies anorexia, fever, weight loss, weight gain, vision loss, decreased hearing, hoarseness, chest pain, syncope, dyspnea on exertion,  peripheral edema, prolonged cough, headaches, hemoptysis, abdominal pain, melena, hematochezia, severe indigestion/heartburn, hematuria, incontinence, muscle weakness, suspicious skin lesions, transient blindness, difficulty walking, depression, unusual weight change, abnormal bleeding, enlarged lymph nodes, and angioedema.     Objective:   Physical Exam     WD, WN, 84 y/o WF in NAD... GENERAL:  Alert & oriented; pleasant & cooperative. HEENT:  [place]/AT, EOM-wnl, PERRLA, EACs-clear, TMs-wnl, NOSE-clear, THROAT-clear & wnl. NECK:  Supple w/ fairROM; no JVD; normal carotid impulses w/o bruits; no thyromegaly or nodules palpated; no lymphadenopathy. CHEST:  Clear to P & A; without wheezes/ rales/ or rhonchi; scar from prev surg on left. HEART:  Regular Rhythm; without murmurs/ rubs/ or gallops heard... ABDOMEN:  Soft & nontender; normal bowel sounds; no organomegaly or masses detected. EXT: without deformities, mild  arthritic changes; no varicose veins/ venous insuffic/ or edema. NEURO:  CN's intact; motor testing normal; sensory testing normal; gait normal & balance OK. DERM:  No lesions noted; no rash etc...  RADIOLOGY DATA:  Reviewed in the EPIC EMR & discussed w/ the patient...    LABORATORY DATA:  Reviewed in the EPIC EMR & discussed w/ the patient...   Assessment:      AR/ Bronchitis>  She uses OTC antihist etc as needed; she denies intercurrent bronchitic infections, etc...  Hx Lung Cancer>  Hx as above; routine CXR is neg- no evid for recurrent dis...   Medical problems managed by Krystal Krystal Craig>>  Palpit & Hx CP>  She notes intermittent self limited palpit & knows to avoid caffeine, pseudophed, etc; see 4/13 Hosp for CP & neg eval Drcooper... Hyperlipid>  Chol numbers ok on the Simva40; needs better low fat diet for the TGs... GI> HH, GERD, Divertics, IBS>  Hx PUD surg yrs ago & she takes an occas Prilosec prn; denies IBS symptoms & says BMs are normal; Krystal 3/15 w/ divertics and  hem... Hx recurrent UTIs in the past; no prob since sling surg 2008... DJD, LBP>  Doing satis on Tramadol & Chiropractor adjustments Qmo she says... Osteopenia>  Followed by Krystal Krystal Craig...     Plan:     Patient's Medications  New Prescriptions   No medications on file  Previous Medications   ACETAMINOPHEN (TYLENOL ARTHRITIS PAIN) 650 MG CR TABLET    Take 650 mg by mouth every 8 (eight) hours as needed for pain.   ASPIRIN 81 MG TABLET    Take 81 mg by mouth daily.   CALCIUM CARBONATE-VITAMIN D (CALCIUM-VITAMIN D) 500-200 MG-UNIT TABLET    Take 1 tablet by mouth daily.   CLONAZEPAM (KLONOPIN) 0.5 MG TABLET    Take 1 tablet (0.5 mg total) 2 (two) times daily as needed by mouth for anxiety.   CONJUGATED ESTROGENS (PREMARIN) VAGINAL CREAM    Place 1 Applicatorful vaginally as needed.   CRANBERRY 1000 MG CAPS    Take by mouth.   METOCLOPRAMIDE (REGLAN) 10 MG TABLET    Take 1 tablet (10 mg total) by mouth 4 (four) times daily.   MULTIPLE VITAMINS-MINERALS (MULTIVITAMIN WITH MINERALS) TABLET    Take 1 tablet by mouth daily.   POLYETHYLENE GLYCOL (MIRALAX / GLYCOLAX) PACKET    Take 17 g by mouth daily.   PROBIOTIC PRODUCT (PROBIOTIC-10) CAPS    Take by mouth.   SIMVASTATIN (ZOCOR) 40 MG TABLET    Take 1 tablet (40 mg total) at bedtime by mouth.   TRAMADOL (ULTRAM) 50 MG TABLET    Take 1 tablet (50 mg total) by mouth every 6 (six) hours as needed.   ZOLPIDEM (AMBIEN) 10 MG TABLET    Take 1 tablet (10 mg total) by mouth at bedtime as needed for sleep.  Modified Medications   No medications on file  Discontinued Medications   AZITHROMYCIN (ZITHROMAX) 250 MG TABLET    Please take two tablets first day and one tablet daily until finished.   PREDNISONE (DELTASONE) 5 MG TABLET    Please take 6,5,4,3,2,1 until finished.

## 2017-09-24 NOTE — Patient Instructions (Signed)
Today we updated your med list in our EPIC system...    Continue your current medications the same...  Today we did a follow up CXR...    We will contact you w/ the results when available...   Call for any questions or if we can be of service in any way.Marland KitchenMarland Kitchen

## 2017-10-03 ENCOUNTER — Ambulatory Visit: Payer: Medicare Other | Admitting: *Deleted

## 2017-10-06 ENCOUNTER — Ambulatory Visit: Payer: Medicare Other | Admitting: *Deleted

## 2017-10-08 ENCOUNTER — Encounter: Payer: Self-pay | Admitting: *Deleted

## 2017-10-08 NOTE — Progress Notes (Signed)
Subjective:   Krystal Craig is a 84 y.o. female who presents for Medicare Annual (Subsequent) preventive examination.   Cardiac Risk Factors include: advanced age (>82men, >60 women);dyslipidemia;Other (see comment), Risk factor comments: caregiver stressLast OV 07/30/2017 S/p LLLobectomy 2006  former smoker with quit date 26'  20 % of lung due to lung cancer and doing well   Retired Personal assistant  Diet  Trig 193; 01/2016 A1c 5.9 in 01/2016; BS 111 10/2016 Eating more sweets She has cut back on pasta She actually bakes her foods  Loves bacon but eats it sparingly     BMI 23.5    Exercise Can't exercise due to back  Does do laundry etc Had back surgery; Pinched nerve and herniated disc but now has bulging disc and OA in general  There are no preventive care reminders to display for this patient.   Colonoscopy 01/2014 - no more due  Mammogram 11/2016 - has these every year  Bone density 09/2014 - 2.1 left femur-  Is not doing anything at this time and declines medication  Has "burning tongue syndrome"  Felt it was due to medication (fosamax)   Pap 08/2014  Educated on shingrix    Objective:     Vitals: BP (!) 142/70   Pulse 71   Ht 5' 3.5" (1.613 m)   Wt 137 lb (62.1 kg)   SpO2 95%   BMI 23.89 kg/m   Body mass index is 23.89 kg/m.  Advanced Directives 10/09/2017 10/02/2016 09/15/2016 03/06/2015 03/06/2015 01/05/2015 01/03/2015  Does Patient Have a Medical Advance Directive? Yes Yes Yes No No Yes Yes  Type of Advance Directive - [place];Living will [place];Living will - - [place];Living will [place];Living will  Does patient want to make changes to medical advance directive? - - - - - No - Patient declined No - Patient declined  Copy of [place] in Chart? - No - copy requested No - copy requested - - - No - copy requested  Would patient like information on creating a  medical advance directive? - - - No - patient declined information No - patient declined information - -    Tobacco Social History   Tobacco Use  Smoking Status Former Smoker  . Packs/day: 1.00  . Years: 30.00  . Pack years: 30.00  . Types: Cigarettes  . Last attempt to quit: 06/17/1984  . Years since quitting: 33.3  Smokeless Tobacco Never Used     Counseling given: Yes   Clinical Intake:     Past Medical History:  Diagnosis Date  . Allergic rhinitis   . Chest pain 09/24/2011   normal stress test  . Chronic kidney disease, stage III (moderate) ([place])   . Complication of anesthesia   . DIVERTICULOSIS OF COLON 08/03/2008   Qualifier: Diagnosis of  By: Lenna Gilford MD, Deborra Medina   . H/O foot surgery   . Hiatal hernia   . History of bronchitis   . IBS (irritable bowel syndrome)   . Impacted cerumen of both ears   . Insomnia   . Lumbar back pain   . Major depression in partial remission ([place])   . Malignant neoplasm of bronchus and lung, unspecified site 2006  . Osteoarthritis   . Osteopenia   . PONV (postoperative nausea and vomiting)   . Pre-diabetes   . Psoriasis   . Pure hypercholesterolemia   . Ulcer   . Urinary  tract infection, site not specified   . Vaginitis   . Vitamin D deficiency    Past Surgical History:  Procedure Laterality Date  . ABDOMINAL HYSTERECTOMY  1969  . APPENDECTOMY    . bilateral cataract surgery  2012   Dr. Talbert Forest  . bilateral shouler operations for impingement syndromes  2001   Dr. Eddie Dibbles  . BUNIONECTOMY Left   . CHOLECYSTECTOMY  1976  . LAPAROSCOPIC LYSIS INTESTINAL ADHESIONS    . left lower lobectomy  11/2004   via VATS and mini-thoractomy for 1.5cm adenoca of lung by Dr. Arlyce Dice  . LUMBAR LAMINECTOMY/DECOMPRESSION MICRODISCECTOMY Left 01/05/2015   Procedure: L3-4 DECOMPRESSION MICRODISCECTOMY ON LEFT;  Surgeon: Melina Schools, MD;  Location: [facility];  Service: Orthopedics;  Laterality: Left;  left Lumbar 3-4  . turmor ear Right 09/2013  . urologic  sugery with cystocele/rectocele repairs and sling  03/2007   Dr. Terance Hart   Family History  Problem Relation Age of Onset  . Breast cancer Sister 54  . Dementia Mother   . Heart attack Mother   . CVA Father   . Stroke Father   . Colon cancer Neg Hx   . Esophageal cancer Neg Hx   . Pancreatic cancer Neg Hx   . Rectal cancer Neg Hx   . Stomach cancer Neg Hx    Social History   Socioeconomic History  . Marital status: Married    Spouse name: Not on file  . Number of children: Not on file  . Years of education: Not on file  . Highest education level: Not on file  Occupational History  . Not on file  Social Needs  . Financial resource strain: Not on file  . Food insecurity:    Worry: Not on file    Inability: Not on file  . Transportation needs:    Medical: Not on file    Non-medical: Not on file  Tobacco Use  . Smoking status: Former Smoker    Packs/day: 1.00    Years: 30.00    Pack years: 30.00    Types: Cigarettes    Last attempt to quit: 06/17/1984    Years since quitting: 33.3  . Smokeless tobacco: Never Used  Substance and Sexual Activity  . Alcohol use: No    Alcohol/week: 0.0 oz  . Drug use: No  . Sexual activity: Not Currently    Birth control/protection: Post-menopausal    Comment: 1st intercourse 27 yo-2 partners  Lifestyle  . Physical activity:    Days per week: Not on file    Minutes per session: Not on file  . Stress: Not on file  Relationships  . Social connections:    Talks on phone: Not on file    Gets together: Not on file    Attends religious service: Not on file    Active member of club or organization: Not on file    Attends meetings of clubs or organizations: Not on file    Relationship status: Not on file  Other Topics Concern  . Not on file  Social History Narrative   Married (2nd marriage-1st husband died at age 48) married 60 29 years in 05/2014. 2 sons from first marriage, 3 from 2nd (step). 15 grandchildren.       Retired in  2008 from [employer]: reading, golf, family time    Outpatient Encounter Medications as of 10/09/2017  Medication Sig  . acetaminophen (TYLENOL ARTHRITIS PAIN) 650 MG CR tablet  Take 650 mg by mouth every 8 (eight) hours as needed for pain.  Marland Kitchen aspirin 81 MG tablet Take 81 mg by mouth daily.  . Calcium Carbonate-Vitamin D (CALCIUM-VITAMIN D) 500-200 MG-UNIT tablet Take 1 tablet by mouth daily.  Marland Kitchen conjugated estrogens (PREMARIN) vaginal cream Place 1 Applicatorful vaginally as needed.  . Cranberry 1000 MG CAPS Take by mouth.  . metoCLOPramide (REGLAN) 10 MG tablet Take 1 tablet (10 mg total) by mouth 4 (four) times daily.  . Multiple Vitamins-Minerals (MULTIVITAMIN WITH MINERALS) tablet Take 1 tablet by mouth daily.  . polyethylene glycol (MIRALAX / GLYCOLAX) packet Take 17 g by mouth daily.  . Probiotic Product (PROBIOTIC-10) CAPS Take by mouth.  . simvastatin (ZOCOR) 40 MG tablet Take 1 tablet (40 mg total) at bedtime by mouth.  . traMADol (ULTRAM) 50 MG tablet Take 1 tablet (50 mg total) by mouth every 6 (six) hours as needed.  . clonazePAM (KLONOPIN) 0.5 MG tablet Take 1 tablet (0.5 mg total) 2 (two) times daily as needed by mouth for anxiety.  Marland Kitchen zolpidem (AMBIEN) 10 MG tablet Take 1 tablet (10 mg total) by mouth at bedtime as needed for sleep.   No facility-administered encounter medications on file as of 10/09/2017.     Activities of Daily Living In your present state of health, do you have any difficulty performing the following activities: 10/09/2017  Hearing? Y  Vision? N  Difficulty concentrating or making decisions? N  Comment spouse has alz  Walking or climbing stairs? N  Dressing or bathing? N  Doing errands, shopping? N  Preparing Food and eating ? N  Using the Toilet? N  In the past six months, have you accidently leaked urine? N  Do you have problems with loss of bowel control? N  Managing your Medications? N  Managing your Finances? N  Housekeeping or  managing your Housekeeping? N  Some recent data might be hidden    Patient Care Team: Briscoe Deutscher, DO as PCP - General (Family Medicine)    Assessment:   This is a routine wellness examination for [place].  Exercise Activities and Dietary recommendations Current Exercise Habits: Home exercise routine(care giver and back pain )  Goals    . Patient Stated     Self care and evaluating assistance for long term care giving role  Keep engaging outside        Fall Risk Fall Risk  10/09/2017 10/02/2016 09/12/2014 09/08/2013  Falls in the past year? No No No No     Depression Screen PHQ 2/9 Scores 10/09/2017 10/02/2016 09/12/2014 09/07/2012  PHQ - 2 Score 0 0 0 0     Cognitive Function Ad8 score reviewed for issues:  Issues making decisions:  Less interest in hobbies / activities:  Repeats questions, stories (family complaining):  Trouble using ordinary gadgets (microwave, computer, phone):  Forgets the month or year:   Mismanaging finances:   Remembering appts:  Daily problems with thinking and/or memory: Ad8 score is=0            Immunization History  Administered Date(s) Administered  . Influenza Split 03/18/2011, 05/04/2012, 04/17/2017  . Influenza Whole 04/02/2006, 03/24/2009, 05/01/2010  . Influenza, High Dose Seasonal PF 03/25/2016  . Influenza,inj,Quad PF,6+ Mos 02/28/2014, 04/05/2015  . Influenza-Unspecified 03/17/2013  . Pneumococcal Conjugate-13 05/02/2014  . Pneumococcal Polysaccharide-23 05/04/2015  . Tetanus 02/28/2014      Screening Tests Health Maintenance  Topic Date Due  . INFLUENZA VACCINE  01/15/2018  . TETANUS/TDAP  02/29/2024  .  DEXA SCAN  Completed  . PNA vac Low Risk Adult  Completed         Plan:      PCP Notes   Health Maintenance Mammogram to be repeated Does not want dexa repeated due to declining meds due to reaction to fosamax  Educated regarding pre diabetes  Educated regarding shingrix     Abnormal  Screens  Hearing an issue; but has been evaluated Given resources for one hearing aid if helpful   Referrals  To local services for help with care giving role of spouse with alz dementia   Patient concerns; Asked about stem cell for her pain; referred her to Dr. 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 Medical and social history . Use of alcohol, tobacco or illicit drugs  . Current medications and supplements . Functional ability and status . Nutritional status . Physical activity . Advanced directives . List of other physicians . Hospitalizations, surgeries, and ER visits in previous 12 months . Vitals . Screenings to include cognitive, depression, and falls . Referrals and appointments  In addition, I have reviewed and discussed with patient certain preventive protocols, quality metrics, and best practice recommendations. A written personalized care plan for preventive services as well as general preventive health recommendations were provided to patient.     Wynetta Fines, RN  10/09/2017

## 2017-10-09 ENCOUNTER — Ambulatory Visit (INDEPENDENT_AMBULATORY_CARE_PROVIDER_SITE_OTHER): Payer: Medicare Other | Admitting: *Deleted

## 2017-10-09 ENCOUNTER — Encounter: Payer: Self-pay | Admitting: *Deleted

## 2017-10-09 VITALS — BP 142/70 | HR 71 | Ht 63.5 in | Wt 137.0 lb

## 2017-10-09 DIAGNOSIS — Z0000 Encounter for general adult medical examination without abnormal findings: Secondary | ICD-10-CM

## 2017-10-09 NOTE — Patient Instructions (Addendum)
Krystal Craig , Thank you for taking time to come for your Medicare Wellness Visit. I appreciate your ongoing commitment to your health goals. Please review the following plan we discussed and let me know if I can assist you in the future.   [facility] Address: [address] [place], [place] [address]  Hours:  Open ? Closes 5PM Phone: [phone] Speak with [facility] regarding memory cafe.    May want to review the following web site for other type therapies [email or web address]  Will schedule AWV for next year as well as an apt with Dr. Juleen China soon to discuss pain and stem cell and fup on A1c   [facility] - can assist with hearing aid x 1  No reviews  [facility]  [phone]  [phone] - Denies Amedeo Plenty   [email or web address]    Eat in moderation; enjoy treats, but limit them to one Eats starches with protein   Educated regarding prediabetes and numbers;  A1c ranges from 5.8 to 6.5 or fasting Blood sugar > 115 -126; (126 is diabetic)   Risk: >45yo; family hx; overweight or obese; African American; Hispanic; Latino; American [place] American; [place]; history of diabetes when pregnant; or birth to a baby weighing over 9 lbs. Being less physically active than 30 minutes; 3 times a week;   Prevention; Losing a modest 7 to 8 lbs; If over 200 lbs; 10 to 14 lbs;  Choose healthier foods; colorful veggies; fish or lean meats; drinks water Reduce portion size Start exercising; 30 minutes of fast walking x 30 minutes per day/ 60 min for weight loss    Shingrix is a vaccine for the prevention of Shingles in Adults 50 and older.  If you are on Medicare, the shingrix is covered under your Part D plan, so you will take both of the vaccines in the series at your pharmacy. Please check with  your benefits regarding applicable copays or out of pocket expenses.  The Shingrix is given in 2 vaccines approx 8 weeks apart. You must receive the 2nd dose prior to 6 months from receipt of the first. Please have the pharmacist print out you Immunization  dates for our office records     These are the goals we discussed: Goals    . Patient Stated     Self care and evaluating assistance for long term care giving role  Keep engaging outside        This is a list of the screening recommended for you and due dates:  Health Maintenance  Topic Date Due  . Flu Shot  01/15/2018  . Tetanus Vaccine  02/29/2024  . DEXA scan (bone density measurement)  Completed  . Pneumonia vaccines  Completed      Fall Prevention in the Home Falls can cause injuries. They can happen to people of all ages. There are many things you can do to make your home safe and to help prevent falls. What can I do on the outside of my home?  Regularly fix the edges of walkways and driveways and fix any cracks.  Remove anything that might make you trip as you walk through a door, such as a raised step or threshold.  Trim any bushes or trees on the path to your home.  Use bright outdoor lighting.  Clear any walking paths of anything that might make someone trip, such as rocks or tools.  Regularly check to  see if handrails are loose or broken. Make sure that both sides of any steps have handrails.  Any raised decks and porches should have guardrails on the edges.  Have any leaves, snow, or ice cleared regularly.  Use sand or salt on walking paths during winter.  Clean up any spills in your garage right away. This includes oil or grease spills. What can I do in the bathroom?  Use night lights.  Install grab bars by the toilet and in the tub and shower. Do not use towel bars as grab bars.  Use non-skid mats or decals in the tub or shower.  If you need to sit down in the shower, use a plastic, non-slip  stool.  Keep the floor dry. Clean up any water that spills on the floor as soon as it happens.  Remove soap buildup in the tub or shower regularly.  Attach bath mats securely with double-sided non-slip rug tape.  Do not have throw rugs and other things on the floor that can make you trip. What can I do in the bedroom?  Use night lights.  Make sure that you have a light by your bed that is easy to reach.  Do not use any sheets or blankets that are too big for your bed. They should not hang down onto the floor.  Have a firm chair that has side arms. You can use this for support while you get dressed.  Do not have throw rugs and other things on the floor that can make you trip. What can I do in the kitchen?  Clean up any spills right away.  Avoid walking on wet floors.  Keep items that you use a lot in easy-to-reach places.  If you need to reach something above you, use a strong step stool that has a grab bar.  Keep electrical cords out of the way.  Do not use floor polish or wax that makes floors slippery. If you must use wax, use non-skid floor wax.  Do not have throw rugs and other things on the floor that can make you trip. What can I do with my stairs?  Do not leave any items on the stairs.  Make sure that there are handrails on both sides of the stairs and use them. Fix handrails that are broken or loose. Make sure that handrails are as long as the stairways.  Check any carpeting to make sure that it is firmly attached to the stairs. Fix any carpet that is loose or worn.  Avoid having throw rugs at the top or bottom of the stairs. If you do have throw rugs, attach them to the floor with carpet tape.  Make sure that you have a light switch at the top of the stairs and the bottom of the stairs. If you do not have them, ask someone to add them for you. What else can I do to help prevent falls?  Wear shoes that: ? Do not have high heels. ? Have rubber bottoms. ? Are  comfortable and fit you well. ? Are closed at the toe. Do not wear sandals.  If you use a stepladder: ? Make sure that it is fully opened. Do not climb a closed stepladder. ? Make sure that both sides of the stepladder are locked into place. ? Ask someone to hold it for you, if possible.  Clearly mark and make sure that you can see: ? Any grab bars or handrails. ? First and last steps. ?  Where the edge of each step is.  Use tools that help you move around (mobility aids) if they are needed. These include: ? Canes. ? Walkers. ? Scooters. ? Crutches.  Turn on the lights when you go into a dark area. Replace any light bulbs as soon as they burn out.  Set up your furniture so you have a clear path. Avoid moving your furniture around.  If any of your floors are uneven, fix them.  If there are any pets around you, be aware of where they are.  Review your medicines with your doctor. Some medicines can make you feel dizzy. This can increase your chance of falling. Ask your doctor what other things that you can do to help prevent falls. This information is not intended to replace advice given to you by your health care provider. Make sure you discuss any questions you have with your health care provider. Document Released: 03/30/2009 Document Revised: 11/09/2015 Document Reviewed: 07/08/2014 Elsevier Interactive Patient Education  2018 [place] Maintenance, Female Adopting a healthy lifestyle and getting preventive care can go a long way to promote health and wellness. Talk with your health care provider about what schedule of regular examinations is right for you. This is a good chance for you to check in with your provider about disease prevention and staying healthy. In between checkups, there are plenty of things you can do on your own. Experts have done a lot of research about which lifestyle changes and preventive measures are most likely to keep you healthy. Ask your  health care provider for more information. Weight and diet Eat a healthy diet  Be sure to include plenty of vegetables, fruits, low-fat dairy products, and lean protein.  Do not eat a lot of foods high in solid fats, added sugars, or salt.  Get regular exercise. This is one of the most important things you can do for your health. ? Most adults should exercise for at least 150 minutes each week. The exercise should increase your heart rate and make you sweat (moderate-intensity exercise). ? Most adults should also do strengthening exercises at least twice a week. This is in addition to the moderate-intensity exercise.  Maintain a healthy weight  Body mass index (BMI) is a measurement that can be used to identify possible weight problems. It estimates body fat based on height and weight. Your health care provider can help determine your BMI and help you achieve or maintain a healthy weight.  For females 31 years of age and older: ? A BMI below 18.5 is considered underweight. ? A BMI of 18.5 to 24.9 is normal. ? A BMI of 25 to 29.9 is considered overweight. ? A BMI of 30 and above is considered obese.  Watch levels of cholesterol and blood lipids  You should start having your blood tested for lipids and cholesterol at 82 years of age, then have this test every 5 years.  You may need to have your cholesterol levels checked more often if: ? Your lipid or cholesterol levels are high. ? You are older than 82 years of age. ? You are at high risk for heart disease.  Cancer screening Lung Cancer  Lung cancer screening is recommended for adults 34-64 years old who are at high risk for lung cancer because of a history of smoking.  A yearly low-dose CT scan of the lungs is recommended for people who: ? Currently smoke. ? Have quit within the past 15 years. ? Have  at least a 30-pack-year history of smoking. A pack year is smoking an average of one pack of cigarettes a day for 1  year.  Yearly screening should continue until it has been 15 years since you quit.  Yearly screening should stop if you develop a health problem that would prevent you from having lung cancer treatment.  Breast Cancer  Practice breast self-awareness. This means understanding how your breasts normally appear and feel.  It also means doing regular breast self-exams. Let your health care provider know about any changes, no matter how small.  If you are in your 20s or 30s, you should have a clinical breast exam (CBE) by a health care provider every 1-3 years as part of a regular health exam.  If you are 46 or older, have a CBE every year. Also consider having a breast X-ray (mammogram) every year.  If you have a family history of breast cancer, talk to your health care provider about genetic screening.  If you are at high risk for breast cancer, talk to your health care provider about having an MRI and a mammogram every year.  Breast cancer gene (BRCA) assessment is recommended for women who have family members with BRCA-related cancers. BRCA-related cancers include: ? Breast. ? Ovarian. ? Tubal. ? Peritoneal cancers.  Results of the assessment will determine the need for genetic counseling and BRCA1 and BRCA2 testing.  Cervical Cancer Your health care provider may recommend that you be screened regularly for cancer of the pelvic organs (ovaries, uterus, and vagina). This screening involves a pelvic examination, including checking for microscopic changes to the surface of your cervix (Pap test). You may be encouraged to have this screening done every 3 years, beginning at age 26.  For women ages 42-65, health care providers may recommend pelvic exams and Pap testing every 3 years, or they may recommend the Pap and pelvic exam, combined with testing for human papilloma virus (HPV), every 5 years. Some types of HPV increase your risk of cervical cancer. Testing for HPV may also be done on  women of any age with unclear Pap test results.  Other health care providers may not recommend any screening for nonpregnant women who are considered low risk for pelvic cancer and who do not have symptoms. Ask your health care provider if a screening pelvic exam is right for you.  If you have had past treatment for cervical cancer or a condition that could lead to cancer, you need Pap tests and screening for cancer for at least 20 years after your treatment. If Pap tests have been discontinued, your risk factors (such as having a new sexual partner) need to be reassessed to determine if screening should resume. Some women have medical problems that increase the chance of getting cervical cancer. In these cases, your health care provider may recommend more frequent screening and Pap tests.  Colorectal Cancer  This type of cancer can be detected and often prevented.  Routine colorectal cancer screening usually begins at 82 years of age and continues through 82 years of age.  Your health care provider may recommend screening at an earlier age if you have risk factors for colon cancer.  Your health care provider may also recommend using home test kits to check for hidden blood in the stool.  A small camera at the end of a tube can be used to examine your colon directly (sigmoidoscopy or colonoscopy). This is done to check for the earliest forms of colorectal cancer.  Routine screening usually begins at age 56.  Direct examination of the colon should be repeated every 5-10 years through 82 years of age. However, you may need to be screened more often if early forms of precancerous polyps or small growths are found.  Skin Cancer  Check your skin from head to toe regularly.  Tell your health care provider about any new moles or changes in moles, especially if there is a change in a mole's shape or color.  Also tell your health care provider if you have a mole that is larger than the size of a  pencil eraser.  Always use sunscreen. Apply sunscreen liberally and repeatedly throughout the day.  Protect yourself by wearing long sleeves, pants, a wide-brimmed hat, and sunglasses whenever you are outside.  Heart disease, diabetes, and high blood pressure  High blood pressure causes heart disease and increases the risk of stroke. High blood pressure is more likely to develop in: ? People who have blood pressure in the high end of the normal range (130-139/85-89 mm Hg). ? People who are overweight or obese. ? People who are African American.  If you are 73-66 years of age, have your blood pressure checked every 3-5 years. If you are 27 years of age or older, have your blood pressure checked every year. You should have your blood pressure measured twice-once when you are at a hospital or clinic, and once when you are not at a hospital or clinic. Record the average of the two measurements. To check your blood pressure when you are not at a hospital or clinic, you can use: ? An automated blood pressure machine at a pharmacy. ? A home blood pressure monitor.  If you are between 36 years and 39 years old, ask your health care provider if you should take aspirin to prevent strokes.  Have regular diabetes screenings. This involves taking a blood sample to check your fasting blood sugar level. ? If you are at a normal weight and have a low risk for diabetes, have this test once every three years after 82 years of age. ? If you are overweight and have a high risk for diabetes, consider being tested at a younger age or more often. Preventing infection Hepatitis B  If you have a higher risk for hepatitis B, you should be screened for this virus. You are considered at high risk for hepatitis B if: ? You were born in a country where hepatitis B is common. Ask your health care provider which countries are considered high risk. ? Your parents were born in a high-risk country, and you have not been  immunized against hepatitis B (hepatitis B vaccine). ? You have HIV or AIDS. ? You use needles to inject street drugs. ? You live with someone who has hepatitis B. ? You have had sex with someone who has hepatitis B. ? You get hemodialysis treatment. ? You take certain medicines for conditions, including cancer, organ transplantation, and autoimmune conditions.  Hepatitis C  Blood testing is recommended for: ? Everyone born from [birth year] through 1963. ? Anyone with known risk factors for hepatitis C.  Sexually transmitted infections (STIs)  You should be screened for sexually transmitted infections (STIs) including gonorrhea and chlamydia if: ? You are sexually active and are younger than 82 years of age. ? You are older than 82 years of age and your health care provider tells you that you are at risk for this type of infection. ? Your sexual activity  has changed since you were last screened and you are at an increased risk for chlamydia or gonorrhea. Ask your health care provider if you are at risk.  If you do not have HIV, but are at risk, it may be recommended that you take a prescription medicine daily to prevent HIV infection. This is called pre-exposure prophylaxis (PrEP). You are considered at risk if: ? You are sexually active and do not regularly use condoms or know the HIV status of your partner(s). ? You take drugs by injection. ? You are sexually active with a partner who has HIV.  Talk with your health care provider about whether you are at high risk of being infected with HIV. If you choose to begin PrEP, you should first be tested for HIV. You should then be tested every 3 months for as long as you are taking PrEP. Pregnancy  If you are premenopausal and you may become pregnant, ask your health care provider about preconception counseling.  If you may become pregnant, take 400 to 800 micrograms (mcg) of folic acid every day.  If you want to prevent pregnancy, talk to your  health care provider about birth control (contraception). Osteoporosis and menopause  Osteoporosis is a disease in which the bones lose minerals and strength with aging. This can result in serious bone fractures. Your risk for osteoporosis can be identified using a bone density scan.  If you are 65 years of age or older, or if you are at risk for osteoporosis and fractures, ask your health care provider if you should be screened.  Ask your health care provider whether you should take a calcium or vitamin D supplement to lower your risk for osteoporosis.  Menopause may have certain physical symptoms and risks.  Hormone replacement therapy may reduce some of these symptoms and risks. Talk to your health care provider about whether hormone replacement therapy is right for you. Follow these instructions at home:  Schedule regular health, dental, and eye exams.  Stay current with your immunizations.  Do not use any tobacco products including cigarettes, chewing tobacco, or electronic cigarettes.  If you are pregnant, do not drink alcohol.  If you are breastfeeding, limit how much and how often you drink alcohol.  Limit alcohol intake to no more than 1 drink per day for nonpregnant women. One drink equals 12 ounces of beer, 5 ounces of wine, or 1 ounces of hard liquor.  Do not use street drugs.  Do not share needles.  Ask your health care provider for help if you need support or information about quitting drugs.  Tell your health care provider if you often feel depressed.  Tell your health care provider if you have ever been abused or do not feel safe at home. This information is not intended to replace advice given to you by your health care provider. Make sure you discuss any questions you have with your health care provider. Document Released: 12/17/2010 Document Revised: 11/09/2015 Document Reviewed: 03/07/2015 Elsevier Interactive Patient Education  [employer].

## 2017-10-13 NOTE — Progress Notes (Signed)
I have personally reviewed the Medicare Annual Wellness questionnaire and have noted 1. The patient's medical and social history 2. Their use of alcohol, tobacco or illicit drugs 3. Their current medications and supplements 4. The patient's functional ability including ADL's, fall risks, home safety risks and hearing or visual impairment. 5. Diet and physical activities 6. Evidence for depression or mood disorders 7. Reviewed Updated provider list, see scanned forms and [facility] Snapshot.   The patients weight, height, BMI and visual acuity have been recorded in the chart I have made referrals, counseling and provided education to the patient based review of the above and I have provided the pt with a written personalized care plan for preventive services.  I have provided the patient with a copy of your personalized plan for preventive services. Instructed to take the time to review along with their updated medication list.  Krystal Craig  

## 2017-10-14 ENCOUNTER — Other Ambulatory Visit: Payer: Self-pay | Admitting: Family Medicine

## 2017-10-14 DIAGNOSIS — F32A Depression, unspecified: Secondary | ICD-10-CM

## 2017-10-14 DIAGNOSIS — F329 Major depressive disorder, single episode, unspecified: Secondary | ICD-10-CM

## 2017-10-14 DIAGNOSIS — F419 Anxiety disorder, unspecified: Secondary | ICD-10-CM

## 2017-10-14 NOTE — Telephone Encounter (Signed)
Ok to fill 

## 2017-10-15 MED ORDER — CLONAZEPAM 0.5 MG PO TABS: 0.5000 mg | ORAL_TABLET | Freq: Two times a day (BID) | ORAL | 1 refills | Status: DC | PRN

## 2017-10-29 ENCOUNTER — Encounter: Payer: Self-pay | Admitting: Women's Health

## 2017-10-29 ENCOUNTER — Ambulatory Visit: Payer: Medicare Other | Admitting: Women's Health

## 2017-10-29 VITALS — BP 122/80

## 2017-10-29 DIAGNOSIS — R350 Frequency of micturition: Secondary | ICD-10-CM | POA: Diagnosis not present

## 2017-10-29 DIAGNOSIS — N3000 Acute cystitis without hematuria: Secondary | ICD-10-CM

## 2017-10-29 MED ORDER — METOCLOPRAMIDE HCL 10 MG PO TABS: 10.0000 mg | ORAL_TABLET | Freq: Four times a day (QID) | ORAL | 0 refills | Status: DC

## 2017-10-29 MED ORDER — SULFAMETHOXAZOLE-TRIMETHOPRIM 800-160 MG PO TABS: 1.0000 | ORAL_TABLET | Freq: Two times a day (BID) | ORAL | 0 refills | Status: DC

## 2017-10-29 NOTE — Patient Instructions (Signed)

## 2017-10-29 NOTE — Progress Notes (Signed)
84 y/o MWF G2P2 presents with c/o burning with urination over the last 3 days. Has been taking OTC Azo which has provided intermittent symptom relief. Last Azo dose 10/28/17 PM. Denies back pain, urinary frequency, vaginal discharge/odor or fever. History includes multiple UTIs with most recent one 8 months ago. Not sexually active, hysterectomy. History of lung cancer, GERD, and IBS.  Exam: Appears well, color good, no distress.  Alert and oriented.  No CVAT. Vaginal exam deferred.  UA: Leukocytes 1+, nitrites positive, WBC 20-40, RBC 3-10, many bacteria.   Probable UTI  Plan: Septra x3 days, take with food and Reglan. Nausea history with antibiotic use.  Reports good relief of nausea prevented with Reglan. UTI prevention and importance of drinking enough water discussed. Urine culture pending.  Instructed to call if continued problems with urinary burning.  Encouraged weight-bearing exercise with light weights in home. Unable to walk distances due to chronic back pain, able to walk readily in the home.  Cares for her husband who has numerous health problems.Marland Kitchen

## 2017-11-01 LAB — URINE CULTURE
MICRO NUMBER:: 90593694
SPECIMEN QUALITY: ADEQUATE

## 2017-11-05 DIAGNOSIS — H2513 Age-related nuclear cataract, bilateral: Secondary | ICD-10-CM | POA: Diagnosis not present

## 2017-11-05 LAB — URINALYSIS, COMPLETE W/RFL CULTURE
Bilirubin Urine: NEGATIVE
Glucose, UA: NEGATIVE
HYALINE CAST: NONE SEEN /LPF
KETONES UR: NEGATIVE
Nitrites, Initial: POSITIVE — AB
PROTEIN: NEGATIVE
Specific Gravity, Urine: 1.006 (ref 1.001–1.03)
pH: 5 (ref 5.0–8.0)

## 2017-11-05 LAB — URINE CULTURE

## 2017-11-05 LAB — CULTURE INDICATED

## 2017-11-07 ENCOUNTER — Other Ambulatory Visit: Payer: Self-pay | Admitting: Family Medicine

## 2017-11-07 DIAGNOSIS — Z23 Encounter for immunization: Secondary | ICD-10-CM | POA: Diagnosis not present

## 2017-11-07 DIAGNOSIS — M159 Polyosteoarthritis, unspecified: Secondary | ICD-10-CM

## 2017-11-07 NOTE — Telephone Encounter (Signed)
Ok to refill 

## 2017-11-19 ENCOUNTER — Encounter: Payer: Medicare Other | Admitting: Family Medicine

## 2017-11-26 ENCOUNTER — Encounter: Payer: Self-pay | Admitting: Family Medicine

## 2017-11-26 ENCOUNTER — Ambulatory Visit (INDEPENDENT_AMBULATORY_CARE_PROVIDER_SITE_OTHER): Payer: Medicare Other | Admitting: Family Medicine

## 2017-11-26 ENCOUNTER — Ambulatory Visit (INDEPENDENT_AMBULATORY_CARE_PROVIDER_SITE_OTHER): Payer: Medicare Other

## 2017-11-26 VITALS — BP 132/76 | HR 64 | Temp 98.7°F | Ht 63.5 in | Wt 135.2 lb

## 2017-11-26 DIAGNOSIS — R1310 Dysphagia, unspecified: Secondary | ICD-10-CM | POA: Diagnosis not present

## 2017-11-26 DIAGNOSIS — N183 Chronic kidney disease, stage 3 unspecified: Secondary | ICD-10-CM

## 2017-11-26 DIAGNOSIS — F419 Anxiety disorder, unspecified: Secondary | ICD-10-CM | POA: Diagnosis not present

## 2017-11-26 DIAGNOSIS — F32A Depression, unspecified: Secondary | ICD-10-CM

## 2017-11-26 DIAGNOSIS — R739 Hyperglycemia, unspecified: Secondary | ICD-10-CM

## 2017-11-26 DIAGNOSIS — E782 Mixed hyperlipidemia: Secondary | ICD-10-CM

## 2017-11-26 DIAGNOSIS — F329 Major depressive disorder, single episode, unspecified: Secondary | ICD-10-CM | POA: Diagnosis not present

## 2017-11-26 LAB — CBC WITH DIFFERENTIAL/PLATELET
Basophils Absolute: 0.1 10*3/uL (ref 0.0–0.1)
Basophils Relative: 1.2 % (ref 0.0–3.0)
Eosinophils Absolute: 0.4 10*3/uL (ref 0.0–0.7)
Eosinophils Relative: 6.3 % — ABNORMAL HIGH (ref 0.0–5.0)
HCT: 38.8 % (ref 36.0–46.0)
Hemoglobin: 12.9 g/dL (ref 12.0–15.0)
Lymphocytes Relative: 22.1 % (ref 12.0–46.0)
Lymphs Abs: 1.3 10*3/uL (ref 0.7–4.0)
MCHC: 33.4 g/dL (ref 30.0–36.0)
MCV: 90.3 fl (ref 78.0–100.0)
Monocytes Absolute: 0.5 10*3/uL (ref 0.1–1.0)
Monocytes Relative: 8.5 % (ref 3.0–12.0)
Neutro Abs: 3.7 10*3/uL (ref 1.4–7.7)
Neutrophils Relative %: 61.9 % (ref 43.0–77.0)
Platelets: 281 10*3/uL (ref 150.0–400.0)
RBC: 4.29 Mil/uL (ref 3.87–5.11)
RDW: 13.9 % (ref 11.5–15.5)
WBC: 6 10*3/uL (ref 4.0–10.5)

## 2017-11-26 LAB — COMPREHENSIVE METABOLIC PANEL
ALT: 26 U/L (ref 0–35)
AST: 28 U/L (ref 0–37)
Albumin: 4.4 g/dL (ref 3.5–5.2)
Alkaline Phosphatase: 53 U/L (ref 39–117)
BUN: 16 mg/dL (ref 6–23)
CO2: 30 mEq/L (ref 19–32)
Calcium: 9.4 mg/dL (ref 8.4–10.5)
Chloride: 102 mEq/L (ref 96–112)
Creatinine, Ser: 0.9 mg/dL (ref 0.40–1.20)
GFR: 63.62 mL/min (ref >60.00)
Glucose, Bld: 97 mg/dL (ref 70–99)
Potassium: 4.6 mEq/L (ref 3.5–5.1)
Sodium: 138 mEq/L (ref 135–145)
Total Bilirubin: 0.7 mg/dL (ref 0.2–1.2)
Total Protein: 7.1 g/dL (ref 6.0–8.3)

## 2017-11-26 LAB — LIPID PANEL
Cholesterol: 195 mg/dL (ref 0–200)
HDL: 57.9 mg/dL (ref >39.00)
NonHDL: 137.2
Total CHOL/HDL Ratio: 3
Triglycerides: 238 mg/dL — ABNORMAL HIGH (ref 0.0–149.0)
VLDL: 47.6 mg/dL — ABNORMAL HIGH (ref 0.0–40.0)

## 2017-11-26 LAB — LDL CHOLESTEROL, DIRECT: Direct LDL: 93 mg/dL

## 2017-11-26 LAB — HEMOGLOBIN A1C: Hgb A1c MFr Bld: 5.7 % (ref 4.6–6.5)

## 2017-11-26 MED ORDER — CLONAZEPAM 0.5 MG PO TABS: 0.5000 mg | ORAL_TABLET | Freq: Two times a day (BID) | ORAL | 1 refills | Status: DC | PRN

## 2017-11-26 NOTE — Progress Notes (Signed)
Krystal Craig is a 84 y.o. female is here for follow up.  History of Present Illness:   HPI:   1. Dysphagia, unspecified type. New. Has had a few episodes of feeling a choking sensation - first at night while lying on her back, then while eating. Still feels a fullness in her throat.    2. Anxiety and depression. Stable. No SI.    3. CKD (chronic kidney disease), stage III ([place]).   Lab Results  Component Value Date   CREATININE 0.90 11/26/2017   CREATININE 0.97 11/06/2016   CREATININE 0.93 09/15/2016    4. Hyperglycemia.  Lab Results  Component Value Date   HGBA1C 5.7 11/26/2017     5. Mixed hyperlipidemia.   Lab Results  Component Value Date   CHOL 195 11/26/2017   HDL 57.90 11/26/2017   LDLCALC 75 01/26/2016   LDLDIRECT 93.0 11/26/2017   TRIG 238.0 (H) 11/26/2017   CHOLHDL 3 11/26/2017      There are no preventive care reminders to display for this patient. Depression screen [facility] 2/9 10/09/2017 10/02/2016 09/12/2014  Decreased Interest 0 0 0  Down, Depressed, Hopeless 0 0 0  PHQ - 2 Score 0 0 0   PMHx, SurgHx, SocialHx, FamHx, Medications, and Allergies were reviewed in the Visit Navigator and updated as appropriate.   Patient Active Problem List   Diagnosis Date Noted  . Diverticulosis 11/11/2016  . GERD with esophagitis 11/11/2016  . Hx of cancer of lung 11/11/2016  . Primary osteoarthritis of both first carpometacarpal joints 12/28/2015  . Anxiety and depression 04/05/2015  . Lumbar disc herniation 01/05/2015  . Vaginal dryness 09/12/2014  . Microscopic hematuria 09/12/2014  . Psoriasis 02/28/2014  . CKD (chronic kidney disease), stage III ([place]) 02/28/2014  . Thoracic lymphadenopathy 09/25/2011  . Hyperlipidemia 09/25/2011  . Adenocarcinoma of lung ([place]) 08/03/2008  . History of recurrent UTIs 08/03/2008  . Osteopenia 08/03/2008  . Insomnia 08/03/2008  . Allergic rhinitis 06/26/2007  . GERD 06/26/2007  . Diaphragmatic hernia 06/26/2007  .  Irritable bowel syndrome 06/26/2007  . Osteoarthritis 06/26/2007   Social History   Tobacco Use  . Smoking status: Former Smoker    Packs/day: 1.00    Years: 30.00    Pack years: 30.00    Types: Cigarettes    Last attempt to quit: 06/17/1984    Years since quitting: 33.4  . Smokeless tobacco: Never Used  Substance Use Topics  . Alcohol use: No    Alcohol/week: 0.0 oz  . Drug use: No   Current Medications and Allergies:   Current Outpatient Medications:  .  acetaminophen (TYLENOL ARTHRITIS PAIN) 650 MG CR tablet, Take 650 mg by mouth every 8 (eight) hours as needed for pain., Disp: , Rfl:  .  aspirin 81 MG tablet, Take 81 mg by mouth daily., Disp: , Rfl:  .  Calcium Carbonate-Vitamin D (CALCIUM-VITAMIN D) 500-200 MG-UNIT tablet, Take 1 tablet by mouth daily., Disp: , Rfl:  .  clonazePAM (KLONOPIN) 0.5 MG tablet, Take 1 tablet (0.5 mg total) by mouth 2 (two) times daily as needed for anxiety., Disp: 60 tablet, Rfl: 1 .  conjugated estrogens (PREMARIN) vaginal cream, Place 1 Applicatorful vaginally as needed., Disp: , Rfl:  .  Cranberry 1000 MG CAPS, Take by mouth., Disp: , Rfl:  .  CRANBERRY PO, Take 1 tablet by mouth daily., Disp: , Rfl:  .  Cranberry-Vitamin C-Probiotic (AZO CRANBERRY PO), Take by mouth as directed., Disp: , Rfl:  .  metoCLOPramide (REGLAN) 10 MG tablet, Take 1 tablet (10 mg total) by mouth 4 (four) times daily., Disp: 60 tablet, Rfl: 0 .  Multiple Vitamins-Minerals (MULTIVITAMIN WITH MINERALS) tablet, Take 1 tablet by mouth daily., Disp: , Rfl:  .  polyethylene glycol (MIRALAX / GLYCOLAX) packet, Take 17 g by mouth daily., Disp: , Rfl:  .  Probiotic Product (PROBIOTIC-10) CAPS, Take by mouth., Disp: , Rfl:  .  simvastatin (ZOCOR) 40 MG tablet, Take 1 tablet (40 mg total) at bedtime by mouth., Disp: 90 tablet, Rfl: 3 .  sulfamethoxazole-trimethoprim (BACTRIM DS) 800-160 MG tablet, Take 1 tablet by mouth 2 (two) times daily., Disp: 6 tablet, Rfl: 0 .  traMADol  (ULTRAM) 50 MG tablet, TAKE 1 TABLET BY MOUTH EVERY 6 HOURS AS NEEDED, Disp: 90 tablet, Rfl: 2 .  zolpidem (AMBIEN) 10 MG tablet, Take 1 tablet (10 mg total) by mouth at bedtime as needed for sleep., Disp: 30 tablet, Rfl: 2   Allergies  Allergen Reactions  . Ciprocin-Fluocin-Procin [Fluocinolone] Other (See Comments)    Leg and arm numbness  . Codeine     Chest pain/tightness  . Wellbutrin [Bupropion] Rash   Review of Systems   Pertinent items are noted in the HPI. Otherwise, ROS is negative.  Vitals:   Vitals:   11/26/17 0929  BP: 132/76  Pulse: 64  Temp: 98.7 F (37.1 C)  TempSrc: Oral  SpO2: 96%  Weight: 135 lb 3.2 oz (61.3 kg)  Height: 5' 3.5" (1.613 m)     Body mass index is 23.57 kg/m.  Physical Exam:   Physical Exam  Constitutional: She is oriented to person, place, and time. She appears well-developed and well-nourished. No distress.  HENT:  Head: Normocephalic and atraumatic.  Right Ear: External ear normal.  Left Ear: External ear normal.  Nose: Nose normal.  Mouth/Throat: Oropharynx is clear and moist.  Eyes: Pupils are equal, round, and reactive to light. Conjunctivae and EOM are normal.  Neck: Normal range of motion. Neck supple. No thyromegaly present.  Cardiovascular: Normal rate, regular rhythm, normal heart sounds and intact distal pulses.  Pulmonary/Chest: Effort normal and breath sounds normal.  Abdominal: Soft. Bowel sounds are normal.  Musculoskeletal: Normal range of motion.  Lymphadenopathy:    She has no cervical adenopathy.  Neurological: She is alert and oriented to person, place, and time.  Skin: Skin is warm and dry. Capillary refill takes less than 2 seconds.  Psychiatric: She has a normal mood and affect. Her behavior is normal.  Nursing note and vitals reviewed.   Results for orders placed or performed in visit on 11/26/17  Comprehensive metabolic panel  Result Value Ref Range   Sodium 138 135 - 145 mEq/L   Potassium 4.6 3.5 -  5.1 mEq/L   Chloride 102 96 - 112 mEq/L   CO2 30 19 - 32 mEq/L   Glucose, Bld 97 70 - 99 mg/dL   BUN 16 6 - 23 mg/dL   Creatinine, Ser 0.90 0.40 - 1.20 mg/dL   Total Bilirubin 0.7 0.2 - 1.2 mg/dL   Alkaline Phosphatase 53 39 - 117 U/L   AST 28 0 - 37 U/L   ALT 26 0 - 35 U/L   Total Protein 7.1 6.0 - 8.3 g/dL   Albumin 4.4 3.5 - 5.2 g/dL   Calcium 9.4 8.4 - 10.5 mg/dL   GFR 63.62 >60.00 mL/min  CBC with Differential/Platelet  Result Value Ref Range   WBC 6.0 4.0 - 10.5 K/uL  RBC 4.29 3.87 - 5.11 Mil/uL   Hemoglobin 12.9 12.0 - 15.0 g/dL   HCT 38.8 36.0 - 46.0 %   MCV 90.3 78.0 - 100.0 fl   MCHC 33.4 30.0 - 36.0 g/dL   RDW 13.9 11.5 - 15.5 %   Platelets 281.0 150.0 - 400.0 K/uL   Neutrophils Relative % 61.9 43.0 - 77.0 %   Lymphocytes Relative 22.1 12.0 - 46.0 %   Monocytes Relative 8.5 3.0 - 12.0 %   Eosinophils Relative 6.3 (H) 0.0 - 5.0 %   Basophils Relative 1.2 0.0 - 3.0 %   Neutro Abs 3.7 1.4 - 7.7 K/uL   Lymphs Abs 1.3 0.7 - 4.0 K/uL   Monocytes Absolute 0.5 0.1 - 1.0 K/uL   Eosinophils Absolute 0.4 0.0 - 0.7 K/uL   Basophils Absolute 0.1 0.0 - 0.1 K/uL  Hemoglobin A1c  Result Value Ref Range   Hgb A1c MFr Bld 5.7 4.6 - 6.5 %  Lipid panel  Result Value Ref Range   Cholesterol 195 0 - 200 mg/dL   Triglycerides 238.0 (H) 0.0 - 149.0 mg/dL   HDL 57.90 >39.00 mg/dL   VLDL 47.6 (H) 0.0 - 40.0 mg/dL   Total CHOL/HDL Ratio 3    NonHDL 137.20   LDL cholesterol, direct  Result Value Ref Range   Direct LDL 93.0 mg/dL    Assessment and Plan:   Ciera was seen today for annual exam.  Diagnoses and all orders for this visit:  Dysphagia, unspecified type -     Ambulatory referral to Gastroenterology -     DG Neck Soft Tissue  Anxiety and depression Comments: Controlled with Klonopin 1-2 tabs daily prn. Not using every day. Tolerating well without oversedation. No falls.  Orders: -     clonazePAM (KLONOPIN) 0.5 MG tablet; Take 1 tablet (0.5 mg total) by mouth 2  (two) times daily as needed for anxiety.  CKD (chronic kidney disease), stage III (H[facility]) -     Comprehensive metabolic panel -     CBC with Differential/Platelet  Hyperglycemia -     Hemoglobin A1c  Mixed hyperlipidemia -     Lipid panel -     LDL cholesterol, direct    . Reviewed expectations re: course of current medical issues. . Discussed self-management of symptoms. . Outlined signs and symptoms indicating need for more acute intervention. . Patient verbalized understanding and all questions were answered. Marland Kitchen Health Maintenance issues including appropriate healthy diet, exercise, and smoking avoidance were discussed with patient. . See orders for this visit as documented in the electronic medical record. . Patient received an After Visit Summary.  Briscoe Deutscher, DO [place], [facility] 12/02/2017

## 2017-11-28 DIAGNOSIS — Z1231 Encounter for screening mammogram for malignant neoplasm of breast: Secondary | ICD-10-CM | POA: Diagnosis not present

## 2017-11-28 DIAGNOSIS — Z803 Family history of malignant neoplasm of breast: Secondary | ICD-10-CM | POA: Diagnosis not present

## 2017-11-28 LAB — HM MAMMOGRAPHY

## 2017-12-01 ENCOUNTER — Encounter: Payer: Self-pay | Admitting: Surgical

## 2017-12-02 ENCOUNTER — Encounter: Payer: Self-pay | Admitting: Family Medicine

## 2017-12-08 ENCOUNTER — Encounter: Payer: Self-pay | Admitting: Family Medicine

## 2017-12-08 ENCOUNTER — Ambulatory Visit (INDEPENDENT_AMBULATORY_CARE_PROVIDER_SITE_OTHER): Payer: Medicare Other | Admitting: Family Medicine

## 2017-12-08 VITALS — BP 130/74 | HR 69 | Temp 98.8°F | Ht 63.5 in | Wt 135.0 lb

## 2017-12-08 DIAGNOSIS — S39012A Strain of muscle, fascia and tendon of lower back, initial encounter: Secondary | ICD-10-CM

## 2017-12-08 NOTE — Progress Notes (Signed)
Krystal Craig is a 84 y.o. female here for an acute visit.  History of Present Illness:   [name], CMA acting as scribe for Dr. Briscoe Craig.   HPI: Patent in for evaluation for back pain. Was making bed on Thursday and felt a pull in left hip. She has been having pain in low back that radiates into left hip at times. Pain increases with bending and going from sitting to standing. No changes in bowel or bladder. Pain level sitting is a 3/10 but can get to 10/10. She has tried TENS unit and Tramadol only small amount of relief from either.  She has history of back surgery in 2016 and has had on going back pain in the past.   PMHx, SurgHx, SocialHx, Medications, and Allergies were reviewed in the Visit Navigator and updated as appropriate.  Current Medications:   .  acetaminophen (TYLENOL ARTHRITIS PAIN) 650 MG CR tablet, Take 650 mg by mouth every 8 (eight) hours as needed for pain., Disp: , Rfl:  .  aspirin 81 MG tablet, Take 81 mg by mouth daily., Disp: , Rfl:  .  Calcium Carbonate-Vitamin D (CALCIUM-VITAMIN D) 500-200 MG-UNIT tablet, Take 1 tablet by mouth daily., Disp: , Rfl:  .  clonazePAM (KLONOPIN) 0.5 MG tablet, Take 1 tablet (0.5 mg total) by mouth 2 (two) times daily as needed for anxiety., Disp: 60 tablet, Rfl: 1 .  conjugated estrogens (PREMARIN) vaginal cream, Place 1 Applicatorful vaginally as needed., Disp: , Rfl:  .  Cranberry-Vitamin C-Probiotic (AZO CRANBERRY PO), Take by mouth as directed., Disp: , Rfl:  .  metoCLOPramide (REGLAN) 10 MG tablet, Take 1 tablet (10 mg total) by mouth 4 (four) times daily., Disp: 60 tablet, Rfl: 0 .  Multiple Vitamins-Minerals (MULTIVITAMIN WITH MINERALS) tablet, Take 1 tablet by mouth daily., Disp: , Rfl:  .  polyethylene glycol (MIRALAX / GLYCOLAX) packet, Take 17 g by mouth daily., Disp: , Rfl:  .  Probiotic Product (PROBIOTIC-10) CAPS, Take by mouth., Disp: , Rfl:  .  simvastatin (ZOCOR) 40 MG tablet, Take 1 tablet (40 mg  total) at bedtime by mouth., Disp: 90 tablet, Rfl: 3 .  traMADol (ULTRAM) 50 MG tablet, TAKE 1 TABLET BY MOUTH EVERY 6 HOURS AS NEEDED, Disp: 90 tablet, Rfl: 2 .  zolpidem (AMBIEN) 10 MG tablet, Take 1 tablet (10 mg total) by mouth at bedtime as needed for sleep., Disp: 30 tablet, Rfl: 2   Allergies  Allergen Reactions  . Ciprocin-Fluocin-Procin [Fluocinolone] Other (See Comments)    Leg and arm numbness  . Codeine     Chest pain/tightness  . Wellbutrin [Bupropion] Rash   Review of Systems:   Pertinent items are noted in the HPI. Otherwise, ROS is negative.  Vitals:   Vitals:   12/08/17 1519  BP: 130/74  Pulse: 69  Temp: 98.8 F (37.1 C)  TempSrc: Oral  SpO2: 96%  Weight: 135 lb (61.2 kg)  Height: 5' 3.5" (1.613 m)     Body mass index is 23.54 kg/m.  Physical Exam:   Physical Exam  Constitutional: She appears well-nourished.  HENT:  Head: Normocephalic and atraumatic.  Eyes: Pupils are equal, round, and reactive to light. EOM are normal.  Neck: Normal range of motion. Neck supple.  Cardiovascular: Normal rate, regular rhythm, normal heart sounds and intact distal pulses.  Pulmonary/Chest: Effort normal.  Abdominal: Soft.  Musculoskeletal:       Lumbar back: She exhibits decreased range of motion and spasm. She exhibits  no bony tenderness.  Skin: Skin is warm.  Psychiatric: She has a normal mood and affect. Her behavior is normal.  Nursing note and vitals reviewed.    Results for orders placed or performed in visit on 12/01/17  HM MAMMOGRAPHY  Result Value Ref Range   HM Mammogram 0-4 Bi-Rad 0-4 Bi-Rad, Self Reported Normal    Assessment and Plan:   Krystal Craig was seen today for back pain.  Diagnoses and all orders for this visit:  Strain of lumbar region, initial encounter Comments: Left. Exam reassuring. Discussed symptomatic care. Trial OTC lidocaine patch and gently stretching. To Krystal Craig if not improving.     . Reviewed expectations re: course of  current medical issues. . Discussed self-management of symptoms. . Outlined signs and symptoms indicating need for more acute intervention. . Patient verbalized understanding and all questions were answered. Marland Kitchen Health Maintenance issues including appropriate healthy diet, exercise, and smoking avoidance were discussed with patient. . See orders for this visit as documented in the electronic medical record. . Patient received an After Visit Summary.   Krystal Deutscher, DO [place], [facility] 12/11/2017   CMA served as Education administrator during this visit. History, Physical, and Plan performed by medical provider. The above documentation has been reviewed and is accurate and complete. Krystal Craig, D.O.

## 2017-12-19 ENCOUNTER — Ambulatory Visit: Payer: Self-pay

## 2017-12-19 NOTE — Telephone Encounter (Signed)
Pt seen 6/24 for back pain please advise

## 2017-12-19 NOTE — Telephone Encounter (Signed)
Spoke with patient and scheduled her for appointment on Monday at 7:20.  Patient would like to know if she can increase her Klonopin over the weekend to get her through until she can be seen.  Please advise.

## 2017-12-19 NOTE — Telephone Encounter (Signed)
Spoke with patient and advised that she may double her dose of Klonopin.  Patient verbalized understanding and was very thankful.

## 2017-12-19 NOTE — Telephone Encounter (Signed)
Okay to double medication until next week.

## 2017-12-19 NOTE — Telephone Encounter (Signed)
Pt. Reports feeling depressed and sad - states her son who has been married x 30 years is divorcing and "it is really affecting me." She also is her husbands primary caregiver who has Alzheimer's. Reports the Klonopin "really helped me and maybe that could just be increased if I can't see her today." Reports her sleep is affected - denies any suicidal ideations.Lisabeth Devoid [facility]) request triage note and they will advise pt.  Reason for Disposition . [1] New or changed psychiatric medications > 2 weeks ago AND [2] not feeling any better  Answer Assessment - Initial Assessment Questions 1. CONCERN: "What happened that made you call today?"     Feeling depressed because son and his wife are divorcing 2. DEPRESSION SYMPTOM SCREENING: "How are you feeling overall?" (e.g., decreased energy, increased sleeping or difficulty sleeping, difficulty concentrating, feelings of sadness, guilt, hopelessness, or worthlessness)     Feels scared and has knots in her stomach 3. RISK OF HARM - SUICIDAL IDEATION:  "Do you ever have thoughts of hurting or killing yourself?"  (e.g., yes, no, no but preoccupation with thoughts about death)   - INTENT:  "Do you have thoughts of hurting or killing yourself right NOW?" (e.g., yes, no, N/A)   - PLAN: "Do you have a specific plan for how you would do this?" (e.g., gun, knife, overdose, no plan, N/A)     No  4. RISK OF HARM - HOMICIDAL IDEATION:  "Do you ever have thoughts of hurting or killing someone else?"  (e.g., yes, no, no but preoccupation with thoughts about death)   - INTENT:  "Do you have thoughts of hurting or killing someone right NOW?" (e.g., yes, no, N/A)   - PLAN: "Do you have a specific plan for how you would do this?" (e.g., gun, knife, no plan, N/A)      No 5. FUNCTIONAL IMPAIRMENT: "How have things been going for you overall in your life? Have you had any more difficulties than usual doing your normal daily activities?"  (e.g., better, same, worse; self-care,  school, work, interactions)     Worried about son and takes care of husband 6. SUPPORT: "Who is with you now?" "Who do you live with?" "Do you have family or friends nearby who you can talk to?"      Lives with husband 7. THERAPIST: "Do you have a counselor or therapist? Name?"     No 8. STRESSORS: "Has there been any new stress or recent changes in your life?"     Yes 9. DRUG ABUSE/ALCOHOL: "Do you drink alcohol or use any illegal drugs?"      No 10. OTHER: "Do you have any other health or medical symptoms right now?" (e.g., fever)       No 11. PREGNANCY: "Is there any chance you are pregnant?" "When was your last menstrual period?"       No  Protocols used: DEPRESSION-A-AH

## 2017-12-19 NOTE — Telephone Encounter (Signed)
See note

## 2017-12-20 NOTE — Progress Notes (Signed)
Krystal Craig is a 84 y.o. female is here for follow up.  History of Present Illness:   HPI: Phone note from 12/19/17: Patient reports feeling depressed and sad - states her son who has been married x 30 years is divorcing and "it is really affecting me." She also is her husbands primary caregiver who has Alzheimer's. Reports the Klonopin "really helped me and maybe that could just be increased if I can't see her today." Reports her sleep is affected - denies any suicidal ideations.  Patient instructed to increase Klonopin to 2 tabs prn. She did this through the weekend and did well. No sedation. Interested in starting a daily antidepressant. Not interested in therapy yet.   There are no preventive care reminders to display for this patient.   Depression screen [facility] 2/9 12/22/2017 10/09/2017 10/02/2016  Decreased Interest 1 0 0  Down, Depressed, Hopeless 1 0 0  PHQ - 2 Score 2 0 0  Altered sleeping 0 - -  Tired, decreased energy 0 - -  Change in appetite 0 - -  Feeling bad or failure about yourself  0 - -  Trouble concentrating 0 - -  Moving slowly or fidgety/restless 0 - -  Suicidal thoughts 0 - -  PHQ-9 Score 2 - -  Difficult doing work/chores Somewhat difficult - -   PMHx, SurgHx, SocialHx, FamHx, Medications, and Allergies were reviewed in the Visit Navigator and updated as appropriate.   Patient Active Problem List   Diagnosis Date Noted  . Situational depression 12/22/2017  . Diverticulosis 11/11/2016  . GERD with esophagitis 11/11/2016  . Hx of cancer of lung, s/p left lobectomy 2006 11/11/2016  . Primary osteoarthritis of both first carpometacarpal joints 12/28/2015  . GAD (generalized anxiety disorder) 04/05/2015  . Lumbar disc herniation 01/05/2015  . Microscopic hematuria 09/12/2014  . Psoriasis 02/28/2014  . CKD (chronic kidney disease), stage III ([place]) 02/28/2014  . Thoracic lymphadenopathy 09/25/2011  . Hyperlipidemia 09/25/2011  . Adenocarcinoma of lung ([place])  08/03/2008  . Osteopenia 08/03/2008  . Insomnia, takes 5 mg Ambien prn 08/03/2008  . Allergic rhinitis 06/26/2007  . GERD 06/26/2007  . Diaphragmatic hernia 06/26/2007  . Irritable bowel syndrome 06/26/2007  . Osteoarthritis 06/26/2007   Social History   Tobacco Use  . Smoking status: Former Smoker    Packs/day: 1.00    Years: 30.00    Pack years: 30.00    Types: Cigarettes    Last attempt to quit: 06/17/1984    Years since quitting: 33.5  . Smokeless tobacco: Never Used  Substance Use Topics  . Alcohol use: No    Alcohol/week: 0.0 oz  . Drug use: No   Current Medications and Allergies:   .  acetaminophen (TYLENOL ARTHRITIS PAIN) 650 MG CR tablet, Take 650 mg by mouth every 8 (eight) hours as needed for pain., Disp: , Rfl:  .  aspirin 81 MG tablet, Take 81 mg by mouth daily., Disp: , Rfl:  .  Calcium Carbonate-Vitamin D (CALCIUM-VITAMIN D) 500-200 MG-UNIT tablet, Take 1 tablet by mouth daily., Disp: , Rfl:  .  clonazePAM (KLONOPIN) 0.5 MG tablet, Take 1 tablet (0.5 mg total) by mouth 2 (two) times daily as needed for anxiety., Disp: 60 tablet, Rfl: 1 .  conjugated estrogens (PREMARIN) vaginal cream, Place 1 Applicatorful vaginally as needed., Disp: , Rfl:  .  Cranberry-Vitamin C-Probiotic (AZO CRANBERRY PO), Take by mouth as directed., Disp: , Rfl:  .  metoCLOPramide (REGLAN) 10 MG tablet, Take 1  tablet (10 mg total) by mouth 4 (four) times daily., Disp: 60 tablet, Rfl: 0 .  Multiple Vitamins-Minerals (MULTIVITAMIN WITH MINERALS) tablet, Take 1 tablet by mouth daily., Disp: , Rfl:  .  polyethylene glycol (MIRALAX / GLYCOLAX) packet, Take 17 g by mouth daily., Disp: , Rfl:  .  Probiotic Product (PROBIOTIC-10) CAPS, Take by mouth., Disp: , Rfl:  .  simvastatin (ZOCOR) 40 MG tablet, Take 1 tablet (40 mg total) at bedtime by mouth., Disp: 90 tablet, Rfl: 3 .  traMADol (ULTRAM) 50 MG tablet, TAKE 1 TABLET BY MOUTH EVERY 6 HOURS AS NEEDED, Disp: 90 tablet, Rfl: 2 .  escitalopram  (LEXAPRO) 10 MG tablet, Take 1 tablet (10 mg total) by mouth daily., Disp: 30 tablet, Rfl: 1 .  zolpidem (AMBIEN) 10 MG tablet, Take 1 tablet (10 mg total) by mouth at bedtime as needed for sleep., Disp: 30 tablet, Rfl: 2   Allergies  Allergen Reactions  . Ciprocin-Fluocin-Procin [Fluocinolone] Other (See Comments)    Leg and arm numbness  . Codeine     Chest pain/tightness  . Wellbutrin [Bupropion] Rash   Review of Systems   Pertinent items are noted in the HPI. Otherwise, ROS is negative.  Vitals:   Vitals:   12/22/17 0707  BP: 122/64  Pulse: 74  Temp: 98.2 F (36.8 C)  TempSrc: Oral  SpO2: 97%  Weight: 134 lb 12.8 oz (61.1 kg)  Height: 5' 3.5" (1.613 m)     Body mass index is 23.5 kg/m.  Physical Exam:   Physical Exam  Constitutional: She appears well-nourished.  HENT:  Head: Normocephalic and atraumatic.  Eyes: Pupils are equal, round, and reactive to light. EOM are normal.  Neck: Normal range of motion. Neck supple.  Cardiovascular: Normal rate, regular rhythm, normal heart sounds and intact distal pulses.  Pulmonary/Chest: Effort normal.  Abdominal: Soft.  Skin: Skin is warm.  Psychiatric: She has a normal mood and affect. Her behavior is normal.  Nursing note and vitals reviewed.  Results for orders placed or performed in visit on 12/01/17  HM MAMMOGRAPHY  Result Value Ref Range   HM Mammogram 0-4 Bi-Rad 0-4 Bi-Rad, Self Reported Normal    Assessment and Plan:   Yamel was seen today for depression.  Diagnoses and all orders for this visit:  GAD (generalized anxiety disorder) -     escitalopram (LEXAPRO) 10 MG tablet; Take 1 tablet (10 mg total) by mouth daily.  Situational depression -     escitalopram (LEXAPRO) 10 MG tablet; Take 1 tablet (10 mg total) by mouth daily.  Primary insomnia Comments: Okay to continue Ambien - taking 1/2 tab nightly prn. Not taking every night. Orders: -     zolpidem (AMBIEN) 10 MG tablet; Take 1 tablet (10 mg  total) by mouth at bedtime as needed for sleep.    . Reviewed expectations re: course of current medical issues. . Discussed self-management of symptoms. . Outlined signs and symptoms indicating need for more acute intervention. . Patient verbalized understanding and all questions were answered. Marland Kitchen Health Maintenance issues including appropriate healthy diet, exercise, and smoking avoidance were discussed with patient. . See orders for this visit as documented in the electronic medical record. . Patient received an After Visit Summary.  Briscoe Deutscher, DO [place], [facility] 12/22/2017  Future Appointments  Date Time Provider [place]  12/22/2017 10:45 AM Ladene Artist, MD [facility]  10/12/2018 10:00 AM [facility] HEALTH COACH [facility]

## 2017-12-22 ENCOUNTER — Ambulatory Visit (INDEPENDENT_AMBULATORY_CARE_PROVIDER_SITE_OTHER): Payer: Medicare Other | Admitting: Family Medicine

## 2017-12-22 ENCOUNTER — Encounter: Payer: Self-pay | Admitting: Family Medicine

## 2017-12-22 ENCOUNTER — Encounter: Payer: Self-pay | Admitting: Gastroenterology

## 2017-12-22 ENCOUNTER — Ambulatory Visit: Payer: Medicare Other | Admitting: Gastroenterology

## 2017-12-22 VITALS — BP 122/64 | HR 74 | Temp 98.2°F | Ht 63.5 in | Wt 134.8 lb

## 2017-12-22 VITALS — BP 120/74 | HR 70 | Ht 62.5 in | Wt 134.1 lb

## 2017-12-22 DIAGNOSIS — F329 Major depressive disorder, single episode, unspecified: Secondary | ICD-10-CM | POA: Insufficient documentation

## 2017-12-22 DIAGNOSIS — R1310 Dysphagia, unspecified: Secondary | ICD-10-CM | POA: Diagnosis not present

## 2017-12-22 DIAGNOSIS — F5101 Primary insomnia: Secondary | ICD-10-CM | POA: Diagnosis not present

## 2017-12-22 DIAGNOSIS — F411 Generalized anxiety disorder: Secondary | ICD-10-CM | POA: Diagnosis not present

## 2017-12-22 DIAGNOSIS — F4321 Adjustment disorder with depressed mood: Secondary | ICD-10-CM

## 2017-12-22 MED ORDER — ESCITALOPRAM OXALATE 10 MG PO TABS: 10.0000 mg | ORAL_TABLET | Freq: Every day | ORAL | 1 refills | Status: DC

## 2017-12-22 MED ORDER — ZOLPIDEM TARTRATE 10 MG PO TABS: 10.0000 mg | ORAL_TABLET | Freq: Every evening | ORAL | 2 refills | Status: DC | PRN

## 2017-12-22 NOTE — Patient Instructions (Signed)
You have been scheduled for a Barium Esophogram at [facility] on 12/25/17 at 9:30am. Please arrive 15 minutes prior to your appointment for registration. Make certain not to have anything to eat or drink 6 hours prior to your test. If you need to reschedule for any reason, please contact radiology at [phone] to do so. __________________________________________________________________ A barium swallow is an examination that concentrates on views of the esophagus. This tends to be a double contrast exam (barium and two liquids which, when combined, create a gas to distend the wall of the oesophagus) or single contrast (non-ionic iodine based). The study is usually tailored to your symptoms so a good history is essential. Attention is paid during the study to the form, structure and configuration of the esophagus, looking for functional disorders (such as aspiration, dysphagia, achalasia, motility and reflux) EXAMINATION You may be asked to change into a gown, depending on the type of swallow being performed. A radiologist and radiographer will perform the procedure. The radiologist will advise you of the type of contrast selected for your procedure and direct you during the exam. You will be asked to stand, sit or lie in several different positions and to hold a small amount of fluid in your mouth before being asked to swallow while the imaging is performed .In some instances you may be asked to swallow barium coated marshmallows to assess the motility of a solid food bolus. The exam can be recorded as a digital or video fluoroscopy procedure. POST PROCEDURE It will take 1-2 days for the barium to pass through your system. To facilitate this, it is important, unless otherwise directed, to increase your fluids for the next 24-48hrs and to resume your normal diet.  This test typically takes about 30 minutes to  perform. ________________________________________________________________________  Normal BMI (Body Mass Index- based on height and weight) is between 23 and 30. Your BMI today is Body mass index is 24.14 kg/m. Marland Kitchen Please consider follow up  regarding your BMI with your Primary Care Provider.  Thank you for choosing me and [facility].  Pricilla Riffle. Dagoberto Ligas., MD., Marval Regal

## 2017-12-22 NOTE — Progress Notes (Signed)
History of Present Illness: This is an 84 year old female referred by Krystal Deutscher, DO for the evaluation of dysphagia.  She relates a long history of intermittent difficulty swallowing large bites of bread however no difficulties with other foods.  Within the past month she had several episodes of difficulty swallowing her saliva not associated with eating or drinking.  She has not noted the symptoms for the past few weeks.  She has no other gastrointestinal complaints.  She states she has been under increased stress due to her husband's declining health with Alzheimer's disease and her son's report recent separation from his wife.  She is being started on Lexapro and Ambien by Dr. Juleen China. Denies weight loss, abdominal pain, constipation, diarrhea, change in stool caliber, melena, hematochezia, nausea, vomiting, reflux symptoms, chest pain.   Colonoscopy 01/2014 1. Mild diverticulosis in the descending colon and sigmoid colon 2. Large internal hemorrhoids   Allergies  Allergen Reactions  . Ciprocin-Fluocin-Procin [Fluocinolone] Other (See Comments)    Leg and arm numbness  . Codeine     Chest pain/tightness  . Wellbutrin [Bupropion] Rash   Outpatient Medications Prior to Visit  Medication Sig Dispense Refill  . acetaminophen (TYLENOL ARTHRITIS PAIN) 650 MG CR tablet Take 650 mg by mouth every 8 (eight) hours as needed for pain.    Marland Kitchen aspirin 81 MG tablet Take 81 mg by mouth daily.    . Calcium Carbonate-Vitamin D (CALCIUM-VITAMIN D) 500-200 MG-UNIT tablet Take 1 tablet by mouth daily.    . clonazePAM (KLONOPIN) 0.5 MG tablet Take 1 tablet (0.5 mg total) by mouth 2 (two) times daily as needed for anxiety. 60 tablet 1  . conjugated estrogens (PREMARIN) vaginal cream Place 1 Applicatorful vaginally as needed.    . Cranberry-Vitamin C-Probiotic (AZO CRANBERRY PO) Take by mouth as directed.    . escitalopram (LEXAPRO) 10 MG tablet Take 1 tablet (10 mg total) by mouth daily. 30 tablet 1  .  metoCLOPramide (REGLAN) 10 MG tablet Take 1 tablet (10 mg total) by mouth 4 (four) times daily. 60 tablet 0  . Multiple Vitamins-Minerals (MULTIVITAMIN WITH MINERALS) tablet Take 1 tablet by mouth daily.    . polyethylene glycol (MIRALAX / GLYCOLAX) packet Take 17 g by mouth daily.    . Probiotic Product (PROBIOTIC-10) CAPS Take by mouth.    . simvastatin (ZOCOR) 40 MG tablet Take 1 tablet (40 mg total) at bedtime by mouth. 90 tablet 3  . traMADol (ULTRAM) 50 MG tablet TAKE 1 TABLET BY MOUTH EVERY 6 HOURS AS NEEDED 90 tablet 2  . zolpidem (AMBIEN) 10 MG tablet Take 1 tablet (10 mg total) by mouth at bedtime as needed for sleep. 30 tablet 2   No facility-administered medications prior to visit.    Past Medical History:  Diagnosis Date  . Allergic rhinitis   . Chest pain 09/24/2011   normal stress test  . Chronic kidney disease, stage III (moderate) ([place])   . Complication of anesthesia   . Diverticulosis   . DIVERTICULOSIS OF COLON 08/03/2008   Qualifier: Diagnosis of  By: Lenna Gilford MD, Deborra Medina   . H/O foot surgery   . Hiatal hernia   . History of bronchitis   . IBS (irritable bowel syndrome)   . Impacted cerumen of both ears   . Insomnia   . Lumbar back pain   . Major depression in partial remission ([place])   . Malignant neoplasm of bronchus and lung, unspecified site 2006  . Osteoarthritis   .  Osteopenia   . PONV (postoperative nausea and vomiting)   . Pre-diabetes   . Psoriasis   . Pure hypercholesterolemia   . Ulcer   . Urinary tract infection, site not specified   . Vaginitis   . Vitamin D deficiency    Past Surgical History:  Procedure Laterality Date  . ABDOMINAL HYSTERECTOMY  1969  . APPENDECTOMY    . bilateral cataract surgery  2012   Dr. Talbert Forest  . bilateral shouler operations for impingement syndromes  2001   Dr. Eddie Dibbles  . BUNIONECTOMY Left   . CHOLECYSTECTOMY  1976  . LAPAROSCOPIC LYSIS INTESTINAL ADHESIONS    . left lower lobectomy  11/2004   via VATS and  mini-thoractomy for 1.5cm adenoca of lung by Dr. Arlyce Dice  . LUMBAR LAMINECTOMY/DECOMPRESSION MICRODISCECTOMY Left 01/05/2015   Procedure: L3-4 DECOMPRESSION MICRODISCECTOMY ON LEFT;  Surgeon: Melina Schools, MD;  Location: [facility];  Service: Orthopedics;  Laterality: Left;  left Lumbar 3-4  . turmor ear Right 09/2013  . urologic sugery with cystocele/rectocele repairs and sling  03/2007   Dr. Terance Hart   Social History   Socioeconomic History  . Marital status: Married    Spouse name: Not on file  . Number of children: Not on file  . Years of education: Not on file  . Highest education level: Not on file  Occupational History  . Not on file  Social Needs  . Financial resource strain: Not on file  . Food insecurity:    Worry: Not on file    Inability: Not on file  . Transportation needs:    Medical: Not on file    Non-medical: Not on file  Tobacco Use  . Smoking status: Former Smoker    Packs/day: 1.00    Years: 30.00    Pack years: 30.00    Types: Cigarettes    Last attempt to quit: 06/17/1984    Years since quitting: 33.5  . Smokeless tobacco: Never Used  Substance and Sexual Activity  . Alcohol use: No    Alcohol/week: 0.0 oz  . Drug use: No  . Sexual activity: Not Currently    Birth control/protection: Post-menopausal    Comment: 1st intercourse 40 yo-2 partners  Lifestyle  . Physical activity:    Days per week: Not on file    Minutes per session: Not on file  . Stress: Not on file  Relationships  . Social connections:    Talks on phone: Not on file    Gets together: Not on file    Attends religious service: Not on file    Active member of club or organization: Not on file    Attends meetings of clubs or organizations: Not on file    Relationship status: Not on file  Other Topics Concern  . Not on file  Social History Narrative   Married (2nd marriage-1st husband died at age 82) married 15 29 years in 05/2014. 2 sons from first marriage, 3 from 2nd (step). 15  grandchildren.       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 Breast cancer Sister 15  . Dementia Mother   . Heart attack Mother   . CVA Father   . Stroke Father   . Colon cancer Neg Hx   . Esophageal cancer Neg Hx   . Pancreatic cancer Neg Hx   . Rectal cancer Neg Hx   .  Stomach cancer Neg Hx       Review of Systems: Pertinent positive and negative review of systems were noted in the above HPI section. All other review of systems were otherwise negative.   Physical Exam: General: Well developed, well nourished, no acute distress Head: Normocephalic and atraumatic Eyes:  sclerae anicteric, EOMI Ears: Normal auditory acuity Mouth: No deformity or lesions Neck: Supple, no masses or thyromegaly Lungs: Clear throughout to auscultation Heart: Regular rate and rhythm; no murmurs, rubs or bruits Abdomen: Soft, non tender and non distended. No masses, hepatosplenomegaly or hernias noted. Normal Bowel sounds Rectal: Not done Musculoskeletal: Symmetrical with no gross deformities  Skin: No lesions on visible extremities Pulses:  Normal pulses noted Extremities: No clubbing, cyanosis, edema or deformities noted Neurological: Alert oriented x 4, grossly nonfocal Cervical Nodes:  No significant cervical adenopathy Inguinal Nodes: No significant inguinal adenopathy Psychological:  Alert and cooperative. Normal mood and affect  Assessment and Recommendations:  1.  Dysphagia with bread intermittently for years.  Difficulty swallowing saliva on a few occasions recently.  Rule out esophageal stricture, motility disorder leading to dysphagia with bread.  Possible oropharyngeal disorder or anxiety leading to saliva dysphagia.  Schedule barium esophagram with tablet.  If saliva dysphagia persists schedule MBSS.   cc: Krystal Craig, [place], [place] [address]

## 2017-12-25 ENCOUNTER — Ambulatory Visit (HOSPITAL_COMMUNITY)
Admission: RE | Admit: 2017-12-25 | Discharge: 2017-12-25 | Disposition: A | Discharge status: Home / Self Care | Payer: Medicare Other | Source: Ambulatory Visit | Attending: Gastroenterology | Admitting: Gastroenterology

## 2017-12-25 DIAGNOSIS — R1310 Dysphagia, unspecified: Secondary | ICD-10-CM

## 2017-12-25 DIAGNOSIS — K224 Dyskinesia of esophagus: Secondary | ICD-10-CM | POA: Insufficient documentation

## 2018-01-08 DIAGNOSIS — Z23 Encounter for immunization: Secondary | ICD-10-CM | POA: Diagnosis not present

## 2018-01-22 NOTE — Progress Notes (Signed)
Krystal Craig is a 84 y.o. female is here for follow up.  History of Present Illness:   Krystal Craig, CMA acting as scribe for Dr. Briscoe Deutscher.   TDV:VOHYWVP in office today for follow up. She was started on Lexapro 10mg  at last office visit. She feels like it has made a big difference. She is getting about 7 hours of sleep a night. She has normal appetite. Her son was just diagnosed with a tumor in his bladder and she feels that the medication has help her deal with if very well.   Health Maintenance Due  Topic Date Due  . INFLUENZA VACCINE  01/15/2018   Depression screen [facility] 2/9 12/22/2017 10/09/2017 10/02/2016  Decreased Interest 1 0 0  Down, Depressed, Hopeless 1 0 0  PHQ - 2 Score 2 0 0  Altered sleeping 0 - -  Tired, decreased energy 0 - -  Change in appetite 0 - -  Feeling bad or failure about yourself  0 - -  Trouble concentrating 0 - -  Moving slowly or fidgety/restless 0 - -  Suicidal thoughts 0 - -  PHQ-9 Score 2 - -  Difficult doing work/chores Somewhat difficult - -   PMHx, SurgHx, SocialHx, FamHx, Medications, and Allergies were reviewed in the Visit Navigator and updated as appropriate.   Patient Active Problem List   Diagnosis Date Noted  . Situational depression 12/22/2017  . Diverticulosis 11/11/2016  . GERD with esophagitis 11/11/2016  . Hx of cancer of lung, s/p left lobectomy 2006 11/11/2016  . Primary osteoarthritis of both first carpometacarpal joints 12/28/2015  . GAD (generalized anxiety disorder) 04/05/2015  . Lumbar disc herniation 01/05/2015  . Microscopic hematuria 09/12/2014  . Psoriasis 02/28/2014  . CKD (chronic kidney disease), stage III ([place]) 02/28/2014  . Thoracic lymphadenopathy 09/25/2011  . Hyperlipidemia 09/25/2011  . Adenocarcinoma of lung ([place]) 08/03/2008  . Osteopenia 08/03/2008  . Insomnia, takes 5 mg Ambien prn 08/03/2008  . Allergic rhinitis 06/26/2007  . GERD 06/26/2007  . Diaphragmatic hernia 06/26/2007  .  Irritable bowel syndrome 06/26/2007  . Osteoarthritis 06/26/2007   Social History   Tobacco Use  . Smoking status: Former Smoker    Packs/day: 1.00    Years: 30.00    Pack years: 30.00    Types: Cigarettes    Last attempt to quit: 06/17/1984    Years since quitting: 33.6  . Smokeless tobacco: Never Used  Substance Use Topics  . Alcohol use: No    Alcohol/week: 0.0 standard drinks  . Drug use: No   Current Medications and Allergies:   Current Outpatient Medications:  .  acetaminophen (TYLENOL ARTHRITIS PAIN) 650 MG CR tablet, Take 650 mg by mouth every 8 (eight) hours as needed for pain., Disp: , Rfl:  .  aspirin 81 MG tablet, Take 81 mg by mouth daily., Disp: , Rfl:  .  Calcium Carbonate-Vitamin D (CALCIUM-VITAMIN D) 500-200 MG-UNIT tablet, Take 1 tablet by mouth daily., Disp: , Rfl:  .  conjugated estrogens (PREMARIN) vaginal cream, Place 1 Applicatorful vaginally as needed., Disp: , Rfl:  .  Cranberry-Vitamin C-Probiotic (AZO CRANBERRY PO), Take by mouth as directed., Disp: , Rfl:  .  escitalopram (LEXAPRO) 10 MG tablet, Take 1 tablet (10 mg total) by mouth daily., Disp: 30 tablet, Rfl: 1 .  metoCLOPramide (REGLAN) 10 MG tablet, Take 1 tablet (10 mg total) by mouth 4 (four) times daily., Disp: 60 tablet, Rfl: 0 .  Multiple Vitamins-Minerals (MULTIVITAMIN WITH MINERALS) tablet, Take  1 tablet by mouth daily., Disp: , Rfl:  .  polyethylene glycol (MIRALAX / GLYCOLAX) packet, Take 17 g by mouth daily., Disp: , Rfl:  .  Probiotic Product (PROBIOTIC-10) CAPS, Take by mouth., Disp: , Rfl:  .  simvastatin (ZOCOR) 40 MG tablet, Take 1 tablet (40 mg total) at bedtime by mouth., Disp: 90 tablet, Rfl: 3 .  traMADol (ULTRAM) 50 MG tablet, Take 1 tablet (50 mg total) by mouth every 6 (six) hours as needed., Disp: 90 tablet, Rfl: 2 .  clonazePAM (KLONOPIN) 0.5 MG tablet, Take 1 tablet (0.5 mg total) by mouth 2 (two) times daily as needed for anxiety., Disp: 60 tablet, Rfl: 1 .  zolpidem (AMBIEN)  10 MG tablet, Take 1 tablet (10 mg total) by mouth at bedtime as needed for sleep., Disp: 30 tablet, Rfl: 2   Allergies  Allergen Reactions  . Ciprocin-Fluocin-Procin [Fluocinolone] Other (See Comments)    Leg and arm numbness  . Codeine     Chest pain/tightness  . Wellbutrin [Bupropion] Rash   Review of Systems   Pertinent items are noted in the HPI. Otherwise, ROS is negative.  Vitals:   Vitals:   01/23/18 1351  BP: 138/78  Pulse: 74  Temp: 99.3 F (37.4 C)  TempSrc: Oral  SpO2: 95%  Weight: 132 lb 12.8 oz (60.2 kg)  Height: 5' 2.5" (1.588 m)     Body mass index is 23.9 kg/m.  Physical Exam:   Physical Exam  Constitutional: She appears well-nourished.  HENT:  Head: Normocephalic and atraumatic.  Eyes: Pupils are equal, round, and reactive to light. EOM are normal.  Neck: Normal range of motion. Neck supple.  Cardiovascular: Normal rate, regular rhythm, normal heart sounds and intact distal pulses.  Pulmonary/Chest: Effort normal.  Abdominal: Soft.  Skin: Skin is warm.  Psychiatric: She has a normal mood and affect. Her behavior is normal.  Nursing note and vitals reviewed.  Assessment and Plan:   [name] was seen today for follow-up.  Diagnoses and all orders for this visit:  Age-related osteoporosis without current pathological fracture, does not want treatment Comments: She will work on calcium/D supplementation and weight bearing exercises.  Situational depression Comments: Improved with Lexapro. Orders: -     escitalopram (LEXAPRO) 10 MG tablet; Take 1 tablet (10 mg total) by mouth daily.  GAD (generalized anxiety disorder) Comments: Improved with Lexapro. Using benzo sparingly now. Orders: -     escitalopram (LEXAPRO) 10 MG tablet; Take 1 tablet (10 mg total) by mouth daily.  Osteoarthritis of multiple joints, unspecified osteoarthritis type Comments: Due for new Rx of Tramadol. Lasts 3 months. Reviewed risk versus benefits.  Orders: -      traMADol (ULTRAM) 50 MG tablet; Take 1 tablet (50 mg total) by mouth every 6 (six) hours as needed.    . Reviewed expectations re: course of current medical issues. . Discussed self-management of symptoms. . Outlined signs and symptoms indicating need for more acute intervention. . Patient verbalized understanding and all questions were answered. Marland Kitchen Health Maintenance issues including appropriate healthy diet, exercise, and smoking avoidance were discussed with patient. . See orders for this visit as documented in the electronic medical record. . Patient received an After Visit Summary.  Briscoe Deutscher, DO [place] [place] 01/24/2018  Future Appointments  Date Time Provider [place]  04/27/2018 10:40 AM Briscoe Deutscher, DO [facility]  10/12/2018 10:00 AM [facility] HEALTH COACH [facility]   CMA served as Education administrator during this visit. History,  Physical, and Plan performed by medical provider. The above documentation has been reviewed and is accurate and complete. Briscoe Deutscher, D.O.

## 2018-01-23 ENCOUNTER — Ambulatory Visit: Payer: Medicare Other | Admitting: Family Medicine

## 2018-01-23 ENCOUNTER — Ambulatory Visit (INDEPENDENT_AMBULATORY_CARE_PROVIDER_SITE_OTHER): Payer: Medicare Other | Admitting: Family Medicine

## 2018-01-23 ENCOUNTER — Encounter: Payer: Self-pay | Admitting: Family Medicine

## 2018-01-23 VITALS — BP 138/78 | HR 74 | Temp 99.3°F | Ht 62.5 in | Wt 132.8 lb

## 2018-01-23 DIAGNOSIS — F4321 Adjustment disorder with depressed mood: Secondary | ICD-10-CM | POA: Diagnosis not present

## 2018-01-23 DIAGNOSIS — M159 Polyosteoarthritis, unspecified: Secondary | ICD-10-CM | POA: Diagnosis not present

## 2018-01-23 DIAGNOSIS — M810 Age-related osteoporosis without current pathological fracture: Secondary | ICD-10-CM | POA: Diagnosis not present

## 2018-01-23 DIAGNOSIS — F411 Generalized anxiety disorder: Secondary | ICD-10-CM

## 2018-01-23 MED ORDER — ESCITALOPRAM OXALATE 10 MG PO TABS: 10.0000 mg | ORAL_TABLET | Freq: Every day | ORAL | 1 refills | Status: DC

## 2018-01-23 MED ORDER — TRAMADOL HCL 50 MG PO TABS: 50.0000 mg | ORAL_TABLET | Freq: Four times a day (QID) | ORAL | 2 refills | Status: DC | PRN

## 2018-01-24 ENCOUNTER — Encounter: Payer: Self-pay | Admitting: Family Medicine

## 2018-02-12 ENCOUNTER — Encounter: Payer: Self-pay | Admitting: Family Medicine

## 2018-02-12 ENCOUNTER — Other Ambulatory Visit: Payer: Self-pay

## 2018-02-12 DIAGNOSIS — F411 Generalized anxiety disorder: Secondary | ICD-10-CM

## 2018-02-12 DIAGNOSIS — F4321 Adjustment disorder with depressed mood: Secondary | ICD-10-CM

## 2018-02-12 MED ORDER — ESCITALOPRAM OXALATE 10 MG PO TABS: 10.0000 mg | ORAL_TABLET | Freq: Every day | ORAL | 0 refills | Status: DC

## 2018-02-17 ENCOUNTER — Other Ambulatory Visit: Payer: Self-pay | Admitting: Family Medicine

## 2018-02-17 DIAGNOSIS — F4321 Adjustment disorder with depressed mood: Secondary | ICD-10-CM

## 2018-02-17 DIAGNOSIS — F411 Generalized anxiety disorder: Secondary | ICD-10-CM

## 2018-02-19 DIAGNOSIS — Z23 Encounter for immunization: Secondary | ICD-10-CM | POA: Diagnosis not present

## 2018-02-20 ENCOUNTER — Other Ambulatory Visit: Payer: Self-pay | Admitting: Surgical

## 2018-02-20 DIAGNOSIS — M549 Dorsalgia, unspecified: Secondary | ICD-10-CM

## 2018-03-06 ENCOUNTER — Encounter (INDEPENDENT_AMBULATORY_CARE_PROVIDER_SITE_OTHER): Payer: Self-pay | Admitting: Physical Medicine and Rehabilitation

## 2018-03-06 ENCOUNTER — Ambulatory Visit (INDEPENDENT_AMBULATORY_CARE_PROVIDER_SITE_OTHER): Payer: Medicare Other | Admitting: Physical Medicine and Rehabilitation

## 2018-03-06 ENCOUNTER — Ambulatory Visit (INDEPENDENT_AMBULATORY_CARE_PROVIDER_SITE_OTHER): Payer: Self-pay

## 2018-03-06 VITALS — BP 154/80 | HR 71 | Ht 63.0 in | Wt 125.0 lb

## 2018-03-06 DIAGNOSIS — M961 Postlaminectomy syndrome, not elsewhere classified: Secondary | ICD-10-CM | POA: Diagnosis not present

## 2018-03-06 DIAGNOSIS — R202 Paresthesia of skin: Secondary | ICD-10-CM | POA: Diagnosis not present

## 2018-03-06 DIAGNOSIS — M5416 Radiculopathy, lumbar region: Secondary | ICD-10-CM

## 2018-03-06 DIAGNOSIS — M5441 Lumbago with sciatica, right side: Secondary | ICD-10-CM | POA: Diagnosis not present

## 2018-03-06 DIAGNOSIS — G8929 Other chronic pain: Secondary | ICD-10-CM

## 2018-03-06 DIAGNOSIS — F4321 Adjustment disorder with depressed mood: Secondary | ICD-10-CM

## 2018-03-06 DIAGNOSIS — G894 Chronic pain syndrome: Secondary | ICD-10-CM | POA: Diagnosis not present

## 2018-03-06 NOTE — Progress Notes (Addendum)
 ..  Numeric Pain Rating Scale and Functional Assessment Average Pain 8 Pain Right Now 5 My pain is constant, dull and aching Pain is worse with: sitting Pain improves with: rest and heat/ice   In the last MONTH (on 0-10 scale) has pain interfered with the following?  1. General activity like being  able to carry out your everyday physical activities such as walking, climbing stairs, carrying groceries, or moving a chair?  Rating(6)  2. Relation with others like being able to carry out your usual social activities and roles such as  activities at home, at work and in your community. Rating(4)  3. Enjoyment of life such that you have  been bothered by emotional problems such as feeling anxious, depressed or irritable?  Rating(0)

## 2018-03-17 ENCOUNTER — Ambulatory Visit (INDEPENDENT_AMBULATORY_CARE_PROVIDER_SITE_OTHER): Payer: Self-pay

## 2018-03-17 ENCOUNTER — Ambulatory Visit (INDEPENDENT_AMBULATORY_CARE_PROVIDER_SITE_OTHER): Payer: Medicare Other | Admitting: Physical Medicine and Rehabilitation

## 2018-03-17 ENCOUNTER — Encounter (INDEPENDENT_AMBULATORY_CARE_PROVIDER_SITE_OTHER): Payer: Self-pay | Admitting: Physical Medicine and Rehabilitation

## 2018-03-17 VITALS — BP 145/62 | HR 68 | Temp 98.4°F

## 2018-03-17 DIAGNOSIS — M5416 Radiculopathy, lumbar region: Secondary | ICD-10-CM | POA: Diagnosis not present

## 2018-03-17 MED ORDER — METHYLPREDNISOLONE ACETATE 80 MG/ML IJ SUSP
80.0000 mg | Freq: Once | INTRAMUSCULAR | Status: AC
  Administered 2018-03-17: 80 mg

## 2018-03-17 NOTE — Progress Notes (Signed)
 .  Numeric Pain Rating Scale and Functional Assessment Average Pain 5   In the last MONTH (on 0-10 scale) has pain interfered with the following?  1. General activity like being  able to carry out your everyday physical activities such as walking, climbing stairs, carrying groceries, or moving a chair?  Rating(4)   +Driver, -BT, -Dye Allergies.

## 2018-03-17 NOTE — Patient Instructions (Signed)

## 2018-03-25 ENCOUNTER — Encounter (INDEPENDENT_AMBULATORY_CARE_PROVIDER_SITE_OTHER): Payer: Self-pay | Admitting: Physical Medicine and Rehabilitation

## 2018-03-25 NOTE — Procedures (Signed)
Lumbar Epidural Steroid Injection - Interlaminar Approach with Fluoroscopic Guidance  Patient: Krystal Craig      Date of Birth: [birth year] MRN: [id number] PCP: Briscoe Deutscher, DO      Visit Date: 03/17/2018   Universal Protocol:     Consent Given By: the patient  Position: PRONE  Additional Comments: Vital signs were monitored before and after the procedure. Patient was prepped and draped in the usual sterile fashion. The correct patient, procedure, and site was verified.   Injection Procedure Details:  Procedure Site One Meds Administered:  Meds ordered this encounter  Medications  . methylPREDNISolone acetate (DEPO-MEDROL) injection 80 mg     Laterality: Right  Location/Site:  L5-S1  Needle size: 20 G  Needle type: Tuohy  Needle Placement: Paramedian epidural  Findings:   -Comments: Excellent flow of contrast into the epidural space.  Procedure Details: Using a paramedian approach from the side mentioned above, the region overlying the inferior lamina was localized under fluoroscopic visualization and the soft tissues overlying this structure were infiltrated with 4 ml. of 1% Lidocaine without Epinephrine. The Tuohy needle was inserted into the epidural space using a paramedian approach.   The epidural space was localized using loss of resistance along with lateral and bi-planar fluoroscopic views.  After negative aspirate for air, blood, and CSF, a 2 ml. volume of Isovue-250 was injected into the epidural space and the flow of contrast was observed. Radiographs were obtained for documentation purposes.    The injectate was administered into the level noted above.   Additional Comments:  The patient tolerated the procedure well Dressing: Band-Aid    Post-procedure details: Patient was observed during the procedure. Post-procedure instructions were reviewed.  Patient left the clinic in stable condition.

## 2018-03-25 NOTE — Progress Notes (Signed)
Krystal Craig - 84 y.o. female MRN [id number]  Date of birth: May 26, 1933  Office Visit Note: Visit Date: 03/06/2018 PCP: Briscoe Deutscher, DO Referred by: Briscoe Deutscher, DO  Subjective: Chief Complaint  Patient presents with  . Lower Back - Pain  . Right Leg - Pain   HPI: Krystal Craig is a 84 y.o. female who comes in today at the request of Dr. Briscoe Deutscher for evaluation management of chronic worsening low back pain particularly with right-sided radicular type symptoms.  She reports many years of chronic low back pain with sometimes left and sometimes right referral pain in the legs.  She reports a history of 2016 of a left L3-4 discectomy laminectomy by Dr. Melina Schools.  She reports that this did relieve some of the symptoms she was having on the left side at the time.  She had undergone some injection treatment at the time that was not successful.  More recently she had a sprain strain type of problem in June of this year it was more left-sided complaints but this resolved with anti-inflammatory medication and time.  She has had physical therapy and tries to continue with exercises at times.  She reports her average pain is an 8 out of 10 and this is a constant dull aching pain predominantly on the right side across the lower back and into the right leg.  Her lower back is probably a little bit more problematic than the occasional right leg pain but they are both noteworthy and do limit what she can do.  Her pain seems to be worse with sitting than standing but it somewhat worse with really sitting for a long time and then trying to get up.  She has better with rest and heat and ice.  She has been using some mild tramadol.  She has not had recent lumbar spine imaging and a 4 view lumbar spine for series was completed today and reviewed below.  She does have mild left concave scoliosis centered at about L3 which is where the discectomy laminectomy was performed there may be some small  collapse there.  She has facet arthropathy of the lower spine as well.  Again she reports doing housework and standing does give her a lot of pain.  She has not noted any focal weakness or bowel or bladder changes.  She has not had any fevers chills or night sweats or unexplained weight loss.  Her case is complicated from a medical standpoint with history of adenocarcinoma of the lung status post lobe resection in 2006.  She also suffering from situational depression which seems to be a fairly significant issue reviewing the notes from Dr. Juleen China.  She has had a history recently of family problems with perhaps a son getting divorced and then finding a tumor and she is primary caregiver of her husband with Alzheimer's disease.  She was recently started on Lexapro that has made a big difference.  She also takes Klonopin.  Review of Systems  Constitutional: Negative for chills, fever, malaise/fatigue and weight loss.  HENT: Negative for hearing loss and sinus pain.   Eyes: Negative for blurred vision, double vision and photophobia.  Respiratory: Negative for cough and shortness of breath.   Cardiovascular: Negative for chest pain, palpitations and leg swelling.  Gastrointestinal: Negative for abdominal pain, nausea and vomiting.  Genitourinary: Negative for flank pain.  Musculoskeletal: Positive for back pain and joint pain. Negative for myalgias.       Right radicular  leg pain  Skin: Negative for itching and rash.  Neurological: Positive for tingling. Negative for tremors, focal weakness and weakness.  Endo/Heme/Allergies: Negative.   Psychiatric/Behavioral: Positive for depression. The patient is nervous/anxious and has insomnia.   All other systems reviewed and are negative.  Otherwise per HPI.  Assessment & Plan: Visit Diagnoses:  1. Lumbar radiculopathy   2. Chronic bilateral low back pain with right-sided sciatica   3. Paresthesia of skin   4. Post laminectomy syndrome   5. Chronic pain  syndrome   6. Situational depression     Plan: Findings:  Chronic history of low back pain and radicular pain status post lumbar discectomy and laminectomy by Dr. Melina Schools in 2016.  No recent MRI imaging but no real red flag complaints at this point other than she is getting pain in the low back that is problematic over time it just has her at this point where she is limited in certain things she can do in its been problematic and not relieved with mild amount of tramadol.  X-rays show spondylosis in general changes of the lumbar spine without anything significant.  I do think she is having some radicular complaints that it could be small disc herniation she has some pain with prolonged sitting which she would usually indicate a disc issue but then she also has pain with standing and walking.  I do think she is having facet joint mediated pain as well.  All of this is in a cycle of some situational depression which we know can make pain complaints much worse.  She has no concomitant diagnosis of fibromyalgia although she does carry a diagnosis of irritable bowel syndrome.  I think the best course of action since she has had medication management for some time as well as physical therapy and activity modification is to complete diagnostic note for therapeutic interlaminar epidural steroid injection at L5-S1 from a general approach.  She gets a lot of relief with that and she is doing well we can just follow her.  If it is temporary relief it might be worth getting an updated MRI of the lumbar spine.  Alternatively could look at diagnostic medial branch blocks and radiofrequency ablation for more low back pain in her low back pain really is more problematic than the right leg.  Continued small amount of tramadol is warranted.  Could consider changing the Lexapro to something like the Loxitane although I would leave that up to Dr. Juleen China.  The duloxetine would have a dual purpose of some potential pain  relief.    Meds & Orders: No orders of the defined types were placed in this encounter.   Orders Placed This Encounter  Procedures  . XR Lumbar Spine Complete    Follow-up: Return for Right L5-S1 interlaminar epidural steroid injection.   Procedures: No procedures performed  No notes on file   Clinical History: 01/05/2015 Left L3-4 DECOMPRESSION MICRODISCECTOMY ON LEFT with Melina Schools, MD   She reports that she quit smoking about 33 years ago. Her smoking use included cigarettes. She has a 30.00 pack-year smoking history. She has never used smokeless tobacco.  Recent Labs    11/26/17 1002  HGBA1C 5.7    Objective:  VS:  HT:5\' 3"  (160 cm)   WT:125 lb (56.7 kg)  BMI:22.15    BP:(!) 154/80  HR:71bpm  TEMP: ( )  RESP:  Physical Exam  Constitutional: She is oriented to person, place, and time. She appears well-developed and well-nourished.  No distress.  HENT:  Head: Normocephalic and atraumatic.  Nose: Nose normal.  Mouth/Throat: Oropharynx is clear and moist.  Eyes: Pupils are equal, round, and reactive to light. Conjunctivae are normal.  Neck: Normal range of motion. Neck supple.  Cardiovascular: Regular rhythm and intact distal pulses.  Pulmonary/Chest: Effort normal. No respiratory distress.  Abdominal: She exhibits no distension. There is no guarding.  Musculoskeletal:  Patient is slow to rise from a seated position to full extension and she does have pain with extension and facet joint loading of the lumbar spine which is concordant for her low back pain.  She has some tenderness over the right PSIS.  She has no pain over the greater trochanter and no pain with hip rotation internal or external.  She has full strength in the lower extremities and symmetric bilaterally.  She has good reflexes at the quadriceps and gastrocnemius and no clonus bilaterally.  Neurological: She is alert and oriented to person, place, and time. She exhibits normal muscle tone. Coordination  normal.  Skin: Skin is warm. No rash noted. No erythema.  Psychiatric: Her behavior is normal.  Somewhat blunted or flat affect.  Nursing note and vitals reviewed.   Ortho Exam Imaging: 4 view lumbar spine film shows mild left convex scoliosis centered about L3 and laminotomy defect at L3-4 on the left.  Disc heights are fairly well-maintained there is no listhesis.  There is no spondylolysis.  There is lower level facet arthropathy from L3-4 down to L5-S1.  Pelvic heights are maintained both hips are well-seated with only minimal degenerative change.  Past Medical/Family/Surgical/Social History: Medications & Allergies reviewed per EMR, new medications updated. Patient Active Problem List   Diagnosis Date Noted  . Situational depression 12/22/2017  . Diverticulosis 11/11/2016  . GERD with esophagitis 11/11/2016  . Hx of cancer of lung, s/p left lobectomy 2006 11/11/2016  . Primary osteoarthritis of both first carpometacarpal joints 12/28/2015  . GAD (generalized anxiety disorder) 04/05/2015  . Lumbar disc herniation 01/05/2015  . Microscopic hematuria 09/12/2014  . Psoriasis 02/28/2014  . CKD (chronic kidney disease), stage III ([place]) 02/28/2014  . Thoracic lymphadenopathy 09/25/2011  . Hyperlipidemia 09/25/2011  . Adenocarcinoma of lung ([place]) 08/03/2008  . Osteopenia 08/03/2008  . Insomnia, takes 5 mg Ambien prn 08/03/2008  . Allergic rhinitis 06/26/2007  . GERD 06/26/2007  . Diaphragmatic hernia 06/26/2007  . Irritable bowel syndrome 06/26/2007  . Osteoarthritis 06/26/2007   Past Medical History:  Diagnosis Date  . Allergic rhinitis   . Chest pain 09/24/2011   normal stress test  . Chronic kidney disease, stage III (moderate) ([place])   . Complication of anesthesia   . Diverticulosis   . DIVERTICULOSIS OF COLON 08/03/2008   Qualifier: Diagnosis of  By: Lenna Gilford MD, Deborra Medina   . H/O foot surgery   . Hiatal hernia   . History of bronchitis   . IBS (irritable bowel syndrome)     . Impacted cerumen of both ears   . Insomnia   . Lumbar back pain   . Major depression in partial remission ([place])   . Malignant neoplasm of bronchus and lung, unspecified site 2006  . Osteoarthritis   . Osteopenia   . PONV (postoperative nausea and vomiting)   . Pre-diabetes   . Psoriasis   . Pure hypercholesterolemia   . Ulcer   . Urinary tract infection, site not specified   . Vaginitis   . Vitamin D deficiency    Family History  Problem Relation  Age of Onset  . Breast cancer Sister 14  . Dementia Mother   . Heart attack Mother   . CVA Father   . Stroke Father   . Colon cancer Neg Hx   . Esophageal cancer Neg Hx   . Pancreatic cancer Neg Hx   . Rectal cancer Neg Hx   . Stomach cancer Neg Hx    Past Surgical History:  Procedure Laterality Date  . ABDOMINAL HYSTERECTOMY  1969  . APPENDECTOMY    . bilateral cataract surgery  2012   Dr. Talbert Forest  . bilateral shouler operations for impingement syndromes  2001   Dr. Eddie Dibbles  . BUNIONECTOMY Left   . CHOLECYSTECTOMY  1976  . LAPAROSCOPIC LYSIS INTESTINAL ADHESIONS    . left lower lobectomy  11/2004   via VATS and mini-thoractomy for 1.5cm adenoca of lung by Dr. Arlyce Dice  . LUMBAR LAMINECTOMY/DECOMPRESSION MICRODISCECTOMY Left 01/05/2015   Procedure: L3-4 DECOMPRESSION MICRODISCECTOMY ON LEFT;  Surgeon: Melina Schools, MD;  Location: [facility];  Service: Orthopedics;  Laterality: Left;  left Lumbar 3-4  . turmor ear Right 09/2013  . urologic sugery with cystocele/rectocele repairs and sling  03/2007   Dr. Terance Hart   Social History   Occupational History  . Not on file  Tobacco Use  . Smoking status: Former Smoker    Packs/day: 1.00    Years: 30.00    Pack years: 30.00    Types: Cigarettes    Last attempt to quit: 06/17/1984    Years since quitting: 33.7  . Smokeless tobacco: Never Used  Substance and Sexual Activity  . Alcohol use: No    Alcohol/week: 0.0 standard drinks  . Drug use: No  . Sexual activity: Not Currently     Birth control/protection: Post-menopausal    Comment: 1st intercourse 68 yo-2 partners

## 2018-03-25 NOTE — Progress Notes (Signed)
   Krystal Craig - 84 y.o. female MRN [id number]  Date of birth: 10-08-1932  Office Visit Note: Visit Date: 03/17/2018 PCP: Briscoe Deutscher, DO Referred by: Briscoe Deutscher, DO  Subjective: Chief Complaint  Patient presents with  . Lower Back - Pain   HPI:  Krystal Craig is a 84 y.o. female who comes in today for planned right L5-S1 interlaminar epidural steroid injection.  Please see our prior evaluation and management note for further details and justification.  ROS Otherwise per HPI.  Assessment & Plan: Visit Diagnoses:  1. Lumbar radiculopathy     Plan: No additional findings.   Meds & Orders:  Meds ordered this encounter  Medications  . methylPREDNISolone acetate (DEPO-MEDROL) injection 80 mg    Orders Placed This Encounter  Procedures  . XR C-ARM NO REPORT  . Epidural Steroid injection    Follow-up: Return if symptoms worsen or fail to improve.   Procedures: No procedures performed  Lumbar Epidural Steroid Injection - Interlaminar Approach with Fluoroscopic Guidance  Patient: Krystal Craig      Date of Birth: 1932/12/24 MRN: [id number] PCP: Briscoe Deutscher, DO      Visit Date: 03/17/2018   Universal Protocol:     Consent Given By: the patient  Position: PRONE  Additional Comments: Vital signs were monitored before and after the procedure. Patient was prepped and draped in the usual sterile fashion. The correct patient, procedure, and site was verified.   Injection Procedure Details:  Procedure Site One Meds Administered:  Meds ordered this encounter  Medications  . methylPREDNISolone acetate (DEPO-MEDROL) injection 80 mg     Laterality: Right  Location/Site:  L5-S1  Needle size: 20 G  Needle type: Tuohy  Needle Placement: Paramedian epidural  Findings:   -Comments: Excellent flow of contrast into the epidural space.  Procedure Details: Using a paramedian approach from the side mentioned above, the region overlying the inferior  lamina was localized under fluoroscopic visualization and the soft tissues overlying this structure were infiltrated with 4 ml. of 1% Lidocaine without Epinephrine. The Tuohy needle was inserted into the epidural space using a paramedian approach.   The epidural space was localized using loss of resistance along with lateral and bi-planar fluoroscopic views.  After negative aspirate for air, blood, and CSF, a 2 ml. volume of Isovue-250 was injected into the epidural space and the flow of contrast was observed. Radiographs were obtained for documentation purposes.    The injectate was administered into the level noted above.   Additional Comments:  The patient tolerated the procedure well Dressing: Band-Aid    Post-procedure details: Patient was observed during the procedure. Post-procedure instructions were reviewed.  Patient left the clinic in stable condition.   Clinical History: 01/05/2015 Left L3-4 DECOMPRESSION MICRODISCECTOMY ON LEFT with Melina Schools, MD     Objective:  VS:  HT:    WT:   BMI:     BP:(!) 145/62  HR:68bpm  TEMP:98.4 F (36.9 C)(Oral)  RESP:  Physical Exam  Ortho Exam Imaging: No results found.

## 2018-03-31 ENCOUNTER — Encounter (INDEPENDENT_AMBULATORY_CARE_PROVIDER_SITE_OTHER): Payer: Self-pay | Admitting: Physical Medicine and Rehabilitation

## 2018-04-24 NOTE — Progress Notes (Signed)
Krystal Craig is a 84 y.o. female is here for follow up.  History of Present Illness:   Krystal Craig, CMA acting as scribe for Dr. Briscoe Deutscher.   HPI: Patient in office for follow up on medications.   Depression Depression symptoms: depressed mood, change in sleep - but improving. Current psychosocial stressors include: None    Treatment to date has included Medication.  Symptoms have been stable since onset of treatment.  Side effects from the treatment include: none.   Alcohol use: none.  Drug use: none.  Exercise: taking care of husband .   Patient denies current suicidal and homicidal ideation.  There are no preventive care reminders to display for this patient. Depression screen [facility] 2/9 04/27/2018 12/22/2017 10/09/2017  Decreased Interest 0 1 0  Down, Depressed, Hopeless 0 1 0  PHQ - 2 Score 0 2 0  Altered sleeping 0 0 -  Tired, decreased energy 0 0 -  Change in appetite 0 0 -  Feeling bad or failure about yourself  0 0 -  Trouble concentrating 0 0 -  Moving slowly or fidgety/restless 0 0 -  Suicidal thoughts 0 0 -  PHQ-9 Score 0 2 -  Difficult doing work/chores Somewhat difficult Somewhat difficult -   PMHx, SurgHx, SocialHx, FamHx, Medications, and Allergies were reviewed in the Visit Navigator and updated as appropriate.   Patient Active Problem List   Diagnosis Date Noted  . Situational depression 12/22/2017  . Diverticulosis 11/11/2016  . GERD with esophagitis 11/11/2016  . Hx of cancer of lung, s/p left lobectomy 2006 11/11/2016  . Situational anxiety 11/06/2016  . Primary osteoarthritis of both first carpometacarpal joints 12/28/2015  . GAD (generalized anxiety disorder) 04/05/2015  . Lumbar disc herniation 01/05/2015  . Microscopic hematuria 09/12/2014  . Psoriasis 02/28/2014  . CKD (chronic kidney disease), stage III ([place]) 02/28/2014  . Thoracic lymphadenopathy 09/25/2011  . Hyperlipidemia 09/25/2011  . Adenocarcinoma of lung ([place]) 08/03/2008    . Osteopenia 08/03/2008  . Insomnia, takes 5 mg Ambien prn 08/03/2008  . Allergic rhinitis 06/26/2007  . GERD 06/26/2007  . Diaphragmatic hernia 06/26/2007  . Irritable bowel syndrome 06/26/2007  . Osteoarthritis 06/26/2007   Social History   Tobacco Use  . Smoking status: Former Smoker    Packs/day: 1.00    Years: 30.00    Pack years: 30.00    Types: Cigarettes    Last attempt to quit: 06/17/1984    Years since quitting: 33.8  . Smokeless tobacco: Never Used  Substance Use Topics  . Alcohol use: No    Alcohol/week: 0.0 standard drinks  . Drug use: No   Current Medications and Allergies:   .  acetaminophen (TYLENOL ARTHRITIS PAIN) 650 MG CR tablet, Take 650 mg by mouth every 8 (eight) hours as needed for pain., Disp: , Rfl:  .  aspirin 81 MG tablet, Take 81 mg by mouth daily., Disp: , Rfl:  .  Calcium Carbonate-Vitamin D (CALCIUM-VITAMIN D) 500-200 MG-UNIT tablet, Take 1 tablet by mouth daily., Disp: , Rfl:  .  clonazePAM (KLONOPIN) 0.5 MG tablet, Take 1 tablet (0.5 mg total) by mouth 2 (two) times daily as needed for anxiety., Disp: 60 tablet, Rfl: 1 .  conjugated estrogens (PREMARIN) vaginal cream, Place 1 Applicatorful vaginally as needed., Disp: , Rfl:  .  Cranberry-Vitamin C-Probiotic (AZO CRANBERRY PO), Take by mouth as directed., Disp: , Rfl:  .  escitalopram (LEXAPRO) 10 MG tablet, TAKE 1 TABLET BY MOUTH  DAILY, Disp:  90 tablet, Rfl: 0 .  metoCLOPramide (REGLAN) 10 MG tablet, Take 1 tablet (10 mg total) by mouth 4 (four) times daily., Disp: 60 tablet, Rfl: 0 .  Multiple Vitamins-Minerals (MULTIVITAMIN WITH MINERALS) tablet, Take 1 tablet by mouth daily., Disp: , Rfl:  .  polyethylene glycol (MIRALAX / GLYCOLAX) packet, Take 17 g by mouth daily., Disp: , Rfl:  .  Probiotic Product (PROBIOTIC-10) CAPS, Take by mouth., Disp: , Rfl:  .  simvastatin (ZOCOR) 40 MG tablet, Take 1 tablet (40 mg total) at bedtime by mouth., Disp: 90 tablet, Rfl: 3 .  traMADol (ULTRAM) 50 MG  tablet, Take 1 tablet (50 mg total) by mouth every 6 (six) hours as needed., Disp: 90 tablet, Rfl: 2 .  zolpidem (AMBIEN) 10 MG tablet, Take 1 tablet (10 mg total) by mouth at bedtime as needed for sleep., Disp: 30 tablet, Rfl: 2   Allergies  Allergen Reactions  . Ciprocin-Fluocin-Procin [Fluocinolone] Other (See Comments)    Leg and arm numbness  . Codeine     Chest pain/tightness  . Wellbutrin [Bupropion] Rash   Review of Systems   Pertinent items are noted in the HPI. Otherwise, ROS is negative.  Vitals:   Vitals:   04/27/18 1039  BP: (!) 144/76  Pulse: 74  Temp: 98.7 F (37.1 C)  TempSrc: Oral  SpO2: 96%  Weight: 138 lb 6.4 oz (62.8 kg)  Height: 5\' 3"  (1.6 m)     Body mass index is 24.52 kg/m.  Physical Exam:   Physical Exam  Constitutional: She appears well-nourished.  HENT:  Head: Normocephalic and atraumatic.  Eyes: Pupils are equal, round, and reactive to light. EOM are normal.  Neck: Normal range of motion. Neck supple.  Cardiovascular: Normal rate, regular rhythm, normal heart sounds and intact distal pulses.  Pulmonary/Chest: Effort normal.  Abdominal: Soft.  Musculoskeletal:  Left foot deformity from previous bunionectomy. Right foot bunion.  Skin: Skin is warm.  Psychiatric: She has a normal mood and affect. Her behavior is normal.  Nursing note and vitals reviewed.  Assessmnt and Plan:   Krystal Craig was seen today for follow-up.  Diagnoses and all orders for this visit:  Situational depression Comments: Improved with Lexapro.  Situational anxiety Comments: Controlled with Klonopin 1-2 tabs daily prn. Not using every day. Tolerating well without oversedation. No falls.  Orders: -     clonazePAM (KLONOPIN) 0.5 MG tablet; Take 1 tablet (0.5 mg total) by mouth 2 (two) times daily as needed for anxiety.  GAD (generalized anxiety disorder) Comments: Improved with Lexapro. Using benzo sparingly now.  Hyperlipidemia, unspecified hyperlipidemia  type Comments: Refill today. Orders: -     simvastatin (ZOCOR) 40 MG tablet; Take 1 tablet (40 mg total) by mouth at bedtime.  Osteoarthritis of multiple joints, unspecified osteoarthritis type Comments: Due for new Rx of Tramadol. Lasts 3 months. Reviewed risk versus benefits.  Orders: -     traMADol (ULTRAM) 50 MG tablet; Take 1 tablet (50 mg total) by mouth every 6 (six) hours as needed.  Primary insomnia Comments: Okay to continue Ambien - taking 1/2 tab nightly prn. Not taking every night. Orders: -     zolpidem (AMBIEN) 10 MG tablet; Take 1 tablet (10 mg total) by mouth at bedtime as needed for sleep.  Other orders -     escitalopram (LEXAPRO) 10 MG tablet; Take 1 tablet (10 mg total) by mouth daily.   . Reviewed expectations re: course of current medical issues. . Discussed self-management of symptoms. Marland Kitchen  Outlined signs and symptoms indicating need for more acute intervention. . Patient verbalized understanding and all questions were answered. Marland Kitchen Health Maintenance issues including appropriate healthy diet, exercise, and smoking avoidance were discussed with patient. . See orders for this visit as documented in the electronic medical record. . Patient received an After Visit Summary.  CMA served as Education administrator during this visit. History, Physical, and Plan performed by medical provider. The above documentation has been reviewed and is accurate and complete. Briscoe Deutscher, D.O.  Briscoe Deutscher, DO [place], [place] [facility] 04/27/2018

## 2018-04-27 ENCOUNTER — Ambulatory Visit (INDEPENDENT_AMBULATORY_CARE_PROVIDER_SITE_OTHER): Payer: Medicare Other | Admitting: Family Medicine

## 2018-04-27 ENCOUNTER — Encounter: Payer: Self-pay | Admitting: Family Medicine

## 2018-04-27 VITALS — BP 144/76 | HR 74 | Temp 98.7°F | Ht 63.0 in | Wt 138.4 lb

## 2018-04-27 DIAGNOSIS — E785 Hyperlipidemia, unspecified: Secondary | ICD-10-CM

## 2018-04-27 DIAGNOSIS — F418 Other specified anxiety disorders: Secondary | ICD-10-CM | POA: Diagnosis not present

## 2018-04-27 DIAGNOSIS — F329 Major depressive disorder, single episode, unspecified: Secondary | ICD-10-CM

## 2018-04-27 DIAGNOSIS — F411 Generalized anxiety disorder: Secondary | ICD-10-CM

## 2018-04-27 DIAGNOSIS — F4321 Adjustment disorder with depressed mood: Secondary | ICD-10-CM

## 2018-04-27 DIAGNOSIS — F419 Anxiety disorder, unspecified: Secondary | ICD-10-CM | POA: Diagnosis not present

## 2018-04-27 DIAGNOSIS — M159 Polyosteoarthritis, unspecified: Secondary | ICD-10-CM

## 2018-04-27 DIAGNOSIS — F5101 Primary insomnia: Secondary | ICD-10-CM

## 2018-04-27 MED ORDER — CLONAZEPAM 0.5 MG PO TABS: 0.5000 mg | ORAL_TABLET | Freq: Two times a day (BID) | ORAL | 1 refills | Status: DC | PRN

## 2018-04-27 MED ORDER — ZOLPIDEM TARTRATE 10 MG PO TABS: 10.0000 mg | ORAL_TABLET | Freq: Every evening | ORAL | 2 refills | Status: DC | PRN

## 2018-04-27 MED ORDER — ESCITALOPRAM OXALATE 10 MG PO TABS: 10.0000 mg | ORAL_TABLET | Freq: Every day | ORAL | 1 refills | Status: DC

## 2018-04-27 MED ORDER — TRAMADOL HCL 50 MG PO TABS: 50.0000 mg | ORAL_TABLET | Freq: Four times a day (QID) | ORAL | 2 refills | Status: DC | PRN

## 2018-04-27 MED ORDER — SIMVASTATIN 40 MG PO TABS: 40.0000 mg | ORAL_TABLET | Freq: Every day | ORAL | 3 refills | Status: DC

## 2018-05-01 ENCOUNTER — Telehealth: Payer: Self-pay | Admitting: Family Medicine

## 2018-05-01 DIAGNOSIS — F5101 Primary insomnia: Secondary | ICD-10-CM

## 2018-05-01 NOTE — Telephone Encounter (Signed)
Copied from [name] [id number]. Topic: Quick Communication - See Telephone Encounter >> May 01, 2018 10:27 AM Marja Kays F wrote: Pt is calling stating that the ambien refill that was called into [facility] on 04/27/18 they do not have the brand Tiva which is the only brand of ambien shce can take and t Wanted to call and say [facility] does have it and would like to have it resent to that pharmacy   [facility] number [phone]

## 2018-05-01 NOTE — Telephone Encounter (Signed)
Will route to office for final disposition.

## 2018-05-01 NOTE — Telephone Encounter (Signed)
Forwarding to [facility]

## 2018-05-04 NOTE — Telephone Encounter (Signed)
Ok to call to new place?

## 2018-05-05 MED ORDER — ZOLPIDEM TARTRATE 10 MG PO TABS: 10.0000 mg | ORAL_TABLET | Freq: Every evening | ORAL | 2 refills | Status: DC | PRN

## 2018-05-05 NOTE — Telephone Encounter (Signed)
Spoke to pt told her Dr. Juleen China sent Rx for Ambien to [facility] on [address]. Pt verbalized understanding.

## 2018-05-05 NOTE — Telephone Encounter (Signed)
Pt calling to check status. Cb#[phone]

## 2018-05-05 NOTE — Telephone Encounter (Signed)
See note

## 2018-05-11 ENCOUNTER — Telehealth: Payer: Self-pay | Admitting: Family Medicine

## 2018-05-11 NOTE — Telephone Encounter (Signed)
Copied from [place] [id number]. Topic: General - Other >> May 11, 2018 11:35 AM Lennox Solders wrote: Reason for CRM:pt is calling and would like to know if she can increase her clonazepam due to having  more anxiety. Pt [facility]

## 2018-05-11 NOTE — Telephone Encounter (Signed)
See note

## 2018-05-11 NOTE — Telephone Encounter (Signed)
Patient informed we can not change over phone patient given app with sam in am. She does not need meds just directions.

## 2018-05-12 ENCOUNTER — Ambulatory Visit (INDEPENDENT_AMBULATORY_CARE_PROVIDER_SITE_OTHER): Payer: Medicare Other | Admitting: Physician Assistant

## 2018-05-12 ENCOUNTER — Encounter: Payer: Self-pay | Admitting: Physician Assistant

## 2018-05-12 VITALS — BP 152/76 | HR 69 | Temp 98.4°F | Ht 63.0 in | Wt 138.5 lb

## 2018-05-12 DIAGNOSIS — F418 Other specified anxiety disorders: Secondary | ICD-10-CM

## 2018-05-12 MED ORDER — CLONAZEPAM 0.5 MG PO TABS: 0.5000 mg | ORAL_TABLET | Freq: Three times a day (TID) | ORAL | 0 refills | Status: DC | PRN

## 2018-05-12 NOTE — Patient Instructions (Addendum)
It was great to see you!  You may increase your klonopin to three times daily. Please be very careful doing this, if you find that it is too sedating, please take it only twice a day as you currently are doing. NEVER take more than three in one day.  Please follow-up with Dr. Juleen China in two weeks. Sooner if you have concerns.  Please call and make an appointment in our office for Krystal Craig, our therapist.  Take care,  [name] PA-C

## 2018-05-12 NOTE — Progress Notes (Signed)
Krystal Craig is a 84 y.o. female here for a follow up of a pre-existing problem.  History of Present Illness:   Chief Complaint  Patient presents with  . Anxiety    HPI   Patient has a history of GAD for several years. She is currently on Lexapro 10 mg and 0.5 mg Klonopin BID prn. She is taking the klonopin BID most every day. She is here today because she is having worsening anxiety. She states that at her last visit, Dr. Juleen China contemplated increasing her Lexapro to 20 mg but they didn't because of possible interaction with the Tramadol that she has been on for 6-7 years. She is the main caregiver for her husband who has Alzheimers and Prostate cancer, son recently diagnosed with bladder cancer. She received a letter in the mail yesterday regarding one of her husband's medications and needing to complete paperwork and she felt extremely overwhelmed and irritable with having to work on this.  She is interested in seeing a therapist, was resistant but now is wanting to see if it could be effective for her. She denies SI/HI.  GAD 7 : Generalized Anxiety Score 05/12/2018 04/27/2018  Nervous, Anxious, on Edge 3 3  Control/stop worrying 3 1  Worry too much - different things 3 1  Trouble relaxing 3 1  Restless 3 1  Easily annoyed or irritable 3 1  Afraid - awful might happen 0 1  Total GAD 7 Score 18 9  Anxiety Difficulty Very difficult Somewhat difficult    Past Medical History:  Diagnosis Date  . Allergic rhinitis   . Chest pain 09/24/2011   normal stress test  . Chronic kidney disease, stage III (moderate) ([place])   . Complication of anesthesia   . Diverticulosis   . DIVERTICULOSIS OF COLON 08/03/2008   Qualifier: Diagnosis of  By: Lenna Gilford MD, Deborra Medina   . H/O foot surgery   . Hiatal hernia   . History of bronchitis   . IBS (irritable bowel syndrome)   . Impacted cerumen of both ears   . Insomnia   . Lumbar back pain   . Major depression in partial remission ([place])   .  Malignant neoplasm of bronchus and lung, unspecified site 2006  . Osteoarthritis   . Osteopenia   . PONV (postoperative nausea and vomiting)   . Pre-diabetes   . Psoriasis   . Pure hypercholesterolemia   . Ulcer   . Urinary tract infection, site not specified   . Vaginitis   . Vitamin D deficiency      Social History   Socioeconomic History  . Marital status: Married    Spouse name: Not on file  . Number of children: Not on file  . Years of education: Not on file  . Highest education level: Not on file  Occupational History  . Not on file  Social Needs  . Financial resource strain: Not on file  . Food insecurity:    Worry: Not on file    Inability: Not on file  . Transportation needs:    Medical: Not on file    Non-medical: Not on file  Tobacco Use  . Smoking status: Former Smoker    Packs/day: 1.00    Years: 30.00    Pack years: 30.00    Types: Cigarettes    Last attempt to quit: 06/17/1984    Years since quitting: 33.9  . Smokeless tobacco: Never Used  Substance and Sexual Activity  . Alcohol use: No  Alcohol/week: 0.0 standard drinks  . Drug use: No  . Sexual activity: Not Currently    Birth control/protection: Post-menopausal    Comment: 1st intercourse 50 yo-2 partners  Lifestyle  . Physical activity:    Days per week: Not on file    Minutes per session: Not on file  . Stress: Not on file  Relationships  . Social connections:    Talks on phone: Not on file    Gets together: Not on file    Attends religious service: Not on file    Active member of club or organization: Not on file    Attends meetings of clubs or organizations: Not on file    Relationship status: Not on file  . Intimate partner violence:    Fear of current or ex partner: Not on file    Emotionally abused: Not on file    Physically abused: Not on file    Forced sexual activity: Not on file  Other Topics Concern  . Not on file  Social History Narrative   Married (2nd marriage-1st  husband died at age 84) married 62 29 years in 05/2014. 2 sons from first marriage, 3 from 2nd (step). 15 grandchildren.       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 ABDOMINAL HYSTERECTOMY  1969  . APPENDECTOMY    . bilateral cataract surgery  2012   Dr. Talbert Forest  . bilateral shouler operations for impingement syndromes  2001   Dr. Eddie Dibbles  . BUNIONECTOMY Left   . CHOLECYSTECTOMY  1976  . LAPAROSCOPIC LYSIS INTESTINAL ADHESIONS    . left lower lobectomy  11/2004   via VATS and mini-thoractomy for 1.5cm adenoca of lung by Dr. Arlyce Dice  . LUMBAR LAMINECTOMY/DECOMPRESSION MICRODISCECTOMY Left 01/05/2015   Procedure: L3-4 DECOMPRESSION MICRODISCECTOMY ON LEFT;  Surgeon: Melina Schools, MD;  Location: [place];  Service: Orthopedics;  Laterality: Left;  left Lumbar 3-4  . turmor ear Right 09/2013  . urologic sugery with cystocele/rectocele repairs and sling  03/2007   Dr. Terance Hart    Family History  Problem Relation Age of Onset  . Breast cancer Sister 22  . Dementia Mother   . Heart attack Mother   . CVA Father   . Stroke Father   . Colon cancer Neg Hx   . Esophageal cancer Neg Hx   . Pancreatic cancer Neg Hx   . Rectal cancer Neg Hx   . Stomach cancer Neg Hx     Allergies  Allergen Reactions  . Ciprocin-Fluocin-Procin [Fluocinolone] Other (See Comments)    Leg and arm numbness  . Codeine     Chest pain/tightness  . Wellbutrin [Bupropion] Rash    Current Medications:   Current Outpatient Medications:  .  acetaminophen (TYLENOL ARTHRITIS PAIN) 650 MG CR tablet, Take 650 mg by mouth every 8 (eight) hours as needed for pain., Disp: , Rfl:  .  aspirin 81 MG tablet, Take 81 mg by mouth daily., Disp: , Rfl:  .  Calcium Carbonate-Vitamin D (CALCIUM-VITAMIN D) 500-200 MG-UNIT tablet, Take 1 tablet by mouth daily., Disp: , Rfl:  .  conjugated estrogens (PREMARIN) vaginal cream, Place 1  Applicatorful vaginally as needed., Disp: , Rfl:  .  Cranberry-Vitamin C-Probiotic (AZO CRANBERRY PO), Take by mouth as directed., Disp: , Rfl:  .  escitalopram (LEXAPRO) 10 MG tablet, Take 1 tablet (10 mg total) by mouth  daily., Disp: 90 tablet, Rfl: 1 .  metoCLOPramide (REGLAN) 10 MG tablet, Take 1 tablet (10 mg total) by mouth 4 (four) times daily., Disp: 60 tablet, Rfl: 0 .  Multiple Vitamins-Minerals (MULTIVITAMIN WITH MINERALS) tablet, Take 1 tablet by mouth daily., Disp: , Rfl:  .  polyethylene glycol (MIRALAX / GLYCOLAX) packet, Take 17 g by mouth daily., Disp: , Rfl:  .  Probiotic Product (PROBIOTIC-10) CAPS, Take by mouth., Disp: , Rfl:  .  simvastatin (ZOCOR) 40 MG tablet, Take 1 tablet (40 mg total) by mouth at bedtime., Disp: 90 tablet, Rfl: 3 .  traMADol (ULTRAM) 50 MG tablet, Take 1 tablet (50 mg total) by mouth every 6 (six) hours as needed., Disp: 90 tablet, Rfl: 2 .  zolpidem (AMBIEN) 10 MG tablet, Take 1 tablet (10 mg total) by mouth at bedtime as needed for sleep., Disp: 30 tablet, Rfl: 2 .  clonazePAM (KLONOPIN) 0.5 MG tablet, Take 1 tablet (0.5 mg total) by mouth 3 (three) times daily as needed for anxiety., Disp: 30 tablet, Rfl: 0   Review of Systems:   ROS Negative unless otherwise specified per HPI.  Vitals:   Vitals:   05/12/18 1017  BP: (!) 152/76  Pulse: 69  Temp: 98.4 F (36.9 C)  TempSrc: Oral  SpO2: 99%  Weight: 138 lb 8 oz (62.8 kg)  Height: 5\' 3"  (1.6 m)     Body mass index is 24.53 kg/m.  Physical Exam:   Physical Exam  Constitutional: She appears well-developed. She is cooperative.  Non-toxic appearance. She does not have a sickly appearance. She does not appear ill. No distress.  Cardiovascular: Normal rate, regular rhythm, S1 normal, S2 normal, normal heart sounds and normal pulses.  No LE edema  Pulmonary/Chest: Effort normal and breath sounds normal.  Neurological: She is alert. GCS eye subscore is 4. GCS verbal subscore is 5. GCS motor  subscore is 6.  Skin: Skin is warm, dry and intact.  Psychiatric: She has a normal mood and affect. Her speech is normal and behavior is normal.  Tearful throughout encounter  Nursing note and vitals reviewed.    Assessment and Plan:   [place] was seen today for anxiety.  Diagnoses and all orders for this visit:  Situational anxiety  Other orders -     clonazePAM (KLONOPIN) 0.5 MG tablet; Take 1 tablet (0.5 mg total) by mouth 3 (three) times daily as needed for anxiety.   Uncontrolled. GAD-7 worse today. [place] Controlled Substance Database reviewed today regarding patient. Patient is compliant with [place] regarding pharmacy use and one-prescribing provider. Will increase klonopin to 0.5 mg TID prn. I have also given her Lisa's information to contact to get an appointment for therapy. I would like for her to return with Dr. Juleen China (or me if necessary) in two weeks to check in on her, sooner if needed. I discussed with patient that if they develop any SI, to tell someone immediately and seek medical attention.  . Reviewed expectations re: course of current medical issues. . Discussed self-management of symptoms. . Outlined signs and symptoms indicating need for more acute intervention. . Patient verbalized understanding and all questions were answered. . See orders for this visit as documented in the electronic medical record. . Patient received an After-Visit Summary.   [name], PA-C

## 2018-05-18 ENCOUNTER — Encounter: Payer: Self-pay | Admitting: Family Medicine

## 2018-05-26 ENCOUNTER — Encounter: Payer: Self-pay | Admitting: Family Medicine

## 2018-05-26 ENCOUNTER — Ambulatory Visit (INDEPENDENT_AMBULATORY_CARE_PROVIDER_SITE_OTHER): Payer: Medicare Other | Admitting: Family Medicine

## 2018-05-26 ENCOUNTER — Ambulatory Visit (INDEPENDENT_AMBULATORY_CARE_PROVIDER_SITE_OTHER): Payer: Medicare Other

## 2018-05-26 VITALS — BP 140/74 | HR 71 | Temp 98.6°F | Ht 63.0 in | Wt 137.2 lb

## 2018-05-26 DIAGNOSIS — M25551 Pain in right hip: Secondary | ICD-10-CM | POA: Diagnosis not present

## 2018-05-26 DIAGNOSIS — M47816 Spondylosis without myelopathy or radiculopathy, lumbar region: Secondary | ICD-10-CM | POA: Insufficient documentation

## 2018-05-26 DIAGNOSIS — F5101 Primary insomnia: Secondary | ICD-10-CM

## 2018-05-26 DIAGNOSIS — E782 Mixed hyperlipidemia: Secondary | ICD-10-CM

## 2018-05-26 DIAGNOSIS — G894 Chronic pain syndrome: Secondary | ICD-10-CM

## 2018-05-26 DIAGNOSIS — M8949 Other hypertrophic osteoarthropathy, multiple sites: Secondary | ICD-10-CM

## 2018-05-26 DIAGNOSIS — I700 Atherosclerosis of aorta: Secondary | ICD-10-CM | POA: Insufficient documentation

## 2018-05-26 DIAGNOSIS — C3492 Malignant neoplasm of unspecified part of left bronchus or lung: Secondary | ICD-10-CM

## 2018-05-26 DIAGNOSIS — M159 Polyosteoarthritis, unspecified: Secondary | ICD-10-CM

## 2018-05-26 DIAGNOSIS — M8580 Other specified disorders of bone density and structure, unspecified site: Secondary | ICD-10-CM

## 2018-05-26 DIAGNOSIS — M4726 Other spondylosis with radiculopathy, lumbar region: Secondary | ICD-10-CM

## 2018-05-26 DIAGNOSIS — M5416 Radiculopathy, lumbar region: Secondary | ICD-10-CM | POA: Diagnosis not present

## 2018-05-26 DIAGNOSIS — F418 Other specified anxiety disorders: Secondary | ICD-10-CM

## 2018-05-26 DIAGNOSIS — Z9049 Acquired absence of other specified parts of digestive tract: Secondary | ICD-10-CM

## 2018-05-26 DIAGNOSIS — K440 Diaphragmatic hernia with obstruction, without gangrene: Secondary | ICD-10-CM

## 2018-05-26 DIAGNOSIS — R3129 Other microscopic hematuria: Secondary | ICD-10-CM

## 2018-05-26 DIAGNOSIS — E88819 Insulin resistance, unspecified: Secondary | ICD-10-CM

## 2018-05-26 DIAGNOSIS — Z85118 Personal history of other malignant neoplasm of bronchus and lung: Secondary | ICD-10-CM

## 2018-05-26 DIAGNOSIS — F4321 Adjustment disorder with depressed mood: Secondary | ICD-10-CM

## 2018-05-26 DIAGNOSIS — Z636 Dependent relative needing care at home: Secondary | ICD-10-CM | POA: Diagnosis not present

## 2018-05-26 DIAGNOSIS — R935 Abnormal findings on diagnostic imaging of other abdominal regions, including retroperitoneum: Secondary | ICD-10-CM

## 2018-05-26 DIAGNOSIS — K582 Mixed irritable bowel syndrome: Secondary | ICD-10-CM

## 2018-05-26 DIAGNOSIS — E8881 Metabolic syndrome: Secondary | ICD-10-CM

## 2018-05-26 DIAGNOSIS — M810 Age-related osteoporosis without current pathological fracture: Secondary | ICD-10-CM

## 2018-05-26 DIAGNOSIS — M150 Primary generalized (osteo)arthritis: Secondary | ICD-10-CM

## 2018-05-26 HISTORY — DX: Atherosclerosis of aorta: I70.0

## 2018-05-26 HISTORY — DX: Spondylosis without myelopathy or radiculopathy, lumbar region: M47.816

## 2018-05-26 MED ORDER — BUSPIRONE HCL 7.5 MG PO TABS: 7.5000 mg | ORAL_TABLET | Freq: Two times a day (BID) | ORAL | 2 refills | Status: AC

## 2018-05-26 NOTE — Progress Notes (Signed)
Krystal Craig is a 84 y.o. female is here for follow up.  History of Present Illness:   [name], CMA acting as scribe for Dr. Briscoe Deutscher.   HPI: Patient in for follow up on anxiety. She was seen by Aldona Bar. Told that she could take her Klonopin 0.5 up to 3 times a day. She has not needed three times a day. She is taking 1 -2 times a day but she does not feel like it is helping. She feels like it is slow acting. Still waiting to see therapist.   Right hip and low back pain, chronic, worsening. No new injury but helping husband as she is his caregiver. With radiation to right leg. No weakness.   Depression screen [facility] 2/9 04/27/2018 12/22/2017 10/09/2017  Decreased Interest 0 1 0  Down, Depressed, Hopeless 0 1 0  PHQ - 2 Score 0 2 0  Altered sleeping 0 0 -  Tired, decreased energy 0 0 -  Change in appetite 0 0 -  Feeling bad or failure about yourself  0 0 -  Trouble concentrating 0 0 -  Moving slowly or fidgety/restless 0 0 -  Suicidal thoughts 0 0 -  PHQ-9 Score 0 2 -  Difficult doing work/chores Somewhat difficult Somewhat difficult -   PMHx, SurgHx, SocialHx, FamHx, Medications, and Allergies were reviewed in the Visit Navigator and updated as appropriate.   Patient Active Problem List   Diagnosis Date Noted  . History of cholecystectomy 05/31/2018  . Age-related osteoporosis without current pathological fracture, does not want treatment 05/31/2018  . Caregiver burden 05/31/2018  . Insulin resistance 05/31/2018  . Chronic pain syndrome 05/31/2018  . Abnormal findings on diagnostic imaging of other abdominal regions, liver and pancreas 05/31/2018  . Aortic atherosclerosis ([place]) 05/26/2018  . Degenerative arthritis of lumbar spine 05/26/2018  . Situational depression 12/22/2017  . Diverticulosis 11/11/2016  . Hx of cancer of lung, s/p left lobectomy 2006 11/11/2016  . Situational anxiety 11/06/2016  . Primary osteoarthritis of both first carpometacarpal joints  12/28/2015  . Microscopic hematuria, with previous evaluation by Urology and determined to be benign 09/12/2014  . Psoriasis 02/28/2014  . CKD (chronic kidney disease), stage III ([place]) 02/28/2014  . Thoracic lymphadenopathy 09/25/2011  . Hyperlipidemia 09/25/2011  . Insomnia, takes 5 mg Ambien prn 08/03/2008  . Allergic rhinitis 06/26/2007  . GERD 06/26/2007  . Irritable bowel syndrome 06/26/2007   Social History   Tobacco Use  . Smoking status: Former Smoker    Packs/day: 1.00    Years: 30.00    Pack years: 30.00    Types: Cigarettes    Last attempt to quit: 06/17/1984    Years since quitting: 33.9  . Smokeless tobacco: Never Used  Substance Use Topics  . Alcohol use: No    Alcohol/week: 0.0 standard drinks  . Drug use: No   Current Medications and Allergies:   .  acetaminophen (TYLENOL ARTHRITIS PAIN) 650 MG CR tablet, Take 650 mg by mouth every 8 (eight) hours as needed for pain., Disp: , Rfl:  .  aspirin 81 MG tablet, Take 81 mg by mouth daily., Disp: , Rfl:  .  Calcium Carbonate-Vitamin D (CALCIUM-VITAMIN D) 500-200 MG-UNIT tablet, Take 1 tablet by mouth daily., Disp: , Rfl:  .  clonazePAM (KLONOPIN) 0.5 MG tablet, Take 1 tablet (0.5 mg total) by mouth 3 (three) times daily as needed for anxiety., Disp: 30 tablet, Rfl: 0 .  conjugated estrogens (PREMARIN) vaginal cream, Place 1 Applicatorful  vaginally as needed., Disp: , Rfl:  .  Cranberry-Vitamin C-Probiotic (AZO CRANBERRY PO), Take by mouth as directed., Disp: , Rfl:  .  escitalopram (LEXAPRO) 10 MG tablet, Take 1 tablet (10 mg total) by mouth daily., Disp: 90 tablet, Rfl: 1 .  metoCLOPramide (REGLAN) 10 MG tablet, Take 1 tablet (10 mg total) by mouth 4 (four) times daily., Disp: 60 tablet, Rfl: 0 .  Multiple Vitamins-Minerals (MULTIVITAMIN WITH MINERALS) tablet, Take 1 tablet by mouth daily., Disp: , Rfl:  .  polyethylene glycol (MIRALAX / GLYCOLAX) packet, Take 17 g by mouth daily., Disp: , Rfl:  .  Probiotic Product  (PROBIOTIC-10) CAPS, Take by mouth., Disp: , Rfl:  .  simvastatin (ZOCOR) 40 MG tablet, Take 1 tablet (40 mg total) by mouth at bedtime., Disp: 90 tablet, Rfl: 3 .  traMADol (ULTRAM) 50 MG tablet, Take 1 tablet (50 mg total) by mouth every 6 (six) hours as needed., Disp: 90 tablet, Rfl: 2 .  zolpidem (AMBIEN) 10 MG tablet, Take 1 tablet (10 mg total) by mouth at bedtime as needed for sleep., Disp: 30 tablet, Rfl: 2   Allergies  Allergen Reactions  . Ciprocin-Fluocin-Procin [Fluocinolone] Other (See Comments)    Leg and arm numbness  . Codeine     Chest pain/tightness  . Wellbutrin [Bupropion] Rash   Review of Systems   Pertinent items are noted in the HPI. Otherwise, a complete ROS is negative.  Vitals:   Vitals:   05/26/18 1024  BP: 140/74  Pulse: 71  Temp: 98.6 F (37 C)  TempSrc: Oral  SpO2: 97%  Weight: 137 lb 3.2 oz (62.2 kg)  Height: 5\' 3"  (1.6 m)     Body mass index is 24.3 kg/m.  Physical Exam:   Physical Exam Vitals signs and nursing note reviewed.  HENT:     Head: Normocephalic and atraumatic.  Eyes:     Pupils: Pupils are equal, round, and reactive to light.  Neck:     Musculoskeletal: Normal range of motion and neck supple.  Cardiovascular:     Rate and Rhythm: Normal rate and regular rhythm.     Heart sounds: Normal heart sounds.  Pulmonary:     Effort: Pulmonary effort is normal.  Abdominal:     Palpations: Abdomen is soft.  Skin:    General: Skin is warm.  Psychiatric:        Behavior: Behavior normal.    Dg Lumbar Spine Complete  Result Date: 05/26/2018 CLINICAL DATA:  Pain, radicular symptoms EXAM: LUMBAR SPINE - COMPLETE 4+ VIEW COMPARISON:  03/06/2018, 01/05/2015 FINDINGS: Scattered pancreatic calcifications consistent with chronic pancreatitis. Large volume of stool in the colon. Trace retrolisthesis L2 on L3. Vertebral body heights are normal. Mild degenerative change L1-L2. Posterior facet degenerative change L4 through S1. Dense  aortic atherosclerosis. IMPRESSION: Mild degenerative changes without acute osseous abnormality. Electronically Signed   By: Donavan Foil M.D.   On: 05/26/2018 15:54   Dg Hip Unilat W Or W/o Pelvis 2-3 Views Right  Result Date: 05/26/2018 CLINICAL DATA:  Pain, no surgery EXAM: DG HIP (WITH OR WITHOUT PELVIS) 2-3V RIGHT COMPARISON:  None. FINDINGS: There is no evidence of hip fracture or dislocation. There is no evidence of arthropathy or other focal bone abnormality. IMPRESSION: Negative. Electronically Signed   By: Donavan Foil M.D.   On: 05/26/2018 15:55   Assessment and Plan:   Caregiver burden Husband with Alzheimer's and prostate cancer. This is her second marriage, so he has adult children  from previous marriage. She does not feel that they are helpful, though she has asked for help. She does not want to place him in SNF because he told her that he did not want to go in the past.   Insomnia, takes 5 mg Ambien prn She has been on this regimen for years.  Hyperlipidemia Formerly on Simvastatin 40 mg, but stopped due to no clear primary prevention indications. Recent xray with atherosclerosis. Will discuss restarting with patient.   Lab Results  Component Value Date   CHOL 195 11/26/2017   HDL 57.90 11/26/2017   LDLCALC 75 01/26/2016   LDLDIRECT 93.0 11/26/2017   TRIG 238.0 (H) 11/26/2017   CHOLHDL 3 11/26/2017    Chronic pain syndrome Pain in multiple joints due to OA, concentration at low back and hips. Worsened due to caregiver burden, husband. Previous laminectomy. Recent visit with Dr. Ernestina Patches reviewed. Lumbar xray stable. Consider fibromyalgia as diagnosis. Consider Cymbalta. Status PT. Working on therapy. Tramadol prn. Epidural injections.   Abnormal findings on diagnostic imaging of other abdominal regions, liver and pancreas Chart review of images with concern for chronic pancreatitis and cirrhosis. Labs stable. Will discuss with patient and consider further  imaging.  Degenerative arthritis of lumbar spine Evaluation by Dr. Ernestina Patches in 9/19. A/P: Chronic history of low back pain and radicular pain status post lumbar discectomy and laminectomy by Dr. Melina Schools in 2016.  No recent MRI imaging but no real red flag complaints at this point other than she is getting pain in the low back that is problematic over time it just has her at this point where she is limited in certain things she can do in its been problematic and not relieved with mild amount of tramadol.  X-rays show spondylosis in general changes of the lumbar spine without anything significant.  I do think she is having some radicular complaints that it could be small disc herniation she has some pain with prolonged sitting which she would usually indicate a disc issue but then she also has pain with standing and walking.  I do think she is having facet joint mediated pain as well.  All of this is in a cycle of some situational depression which we know can make pain complaints much worse.  She has no concomitant diagnosis of fibromyalgia although she does carry a diagnosis of irritable bowel syndrome.  I think the best course of action since she has had medication management for some time as well as physical therapy and activity modification is to complete diagnostic note for therapeutic interlaminar epidural steroid injection at L5-S1 from a general approach.  She gets a lot of relief with that and she is doing well we can just follow her.  If it is temporary relief it might be worth getting an updated MRI of the lumbar spine.  Alternatively could look at diagnostic medial branch blocks and radiofrequency ablation for more low back pain in her low back pain really is more problematic than the right leg.  Continued small amount of tramadol is warranted.  Could consider changing the Lexapro to something like duloxetine as it would have a dual purpose of some potential pain relief.  Patient did endorse some  relief after injection. Dr. Ernestina Patches recommended PT, but patient has yet to do this citing issues with leaving her husband at home. Will offer home PT.   Situational depression Worsened. Caregiver for husband. Son with bladder cancer. No time for herself. Chronic pain. Waiting to see therapist.  On Lexapro and it has helped. Discussed options to change medication versus add on. She decided to trial Buspar today. Will consider Cymbalta. Will be careful as patient on Tramadol.  Orders Placed This Encounter  Procedures  . DG Lumbar Spine Complete  . DG HIP UNILAT W OR W/O PELVIS 2-3 VIEWS RIGHT   Meds ordered this encounter  Medications  . busPIRone (BUSPAR) 7.5 MG tablet    Sig: Take 1 tablet (7.5 mg total) by mouth 2 (two) times daily.    Dispense:  60 tablet    Refill:  2   . Reviewed expectations re: course of current medical issues. . Discussed self-management of symptoms. . Outlined signs and symptoms indicating need for more acute intervention. . Patient verbalized understanding and all questions were answered. Marland Kitchen Health Maintenance issues including appropriate healthy diet, exercise, and smoking avoidance were discussed with patient. . See orders for this visit as documented in the electronic medical record. . Patient received an After Visit Summary.  Briscoe Deutscher, DO [place], [place] [facility] 05/31/2018

## 2018-05-29 ENCOUNTER — Other Ambulatory Visit: Payer: Self-pay

## 2018-05-29 DIAGNOSIS — F411 Generalized anxiety disorder: Secondary | ICD-10-CM

## 2018-05-31 ENCOUNTER — Encounter: Payer: Self-pay | Admitting: Family Medicine

## 2018-05-31 DIAGNOSIS — E8881 Metabolic syndrome: Secondary | ICD-10-CM

## 2018-05-31 DIAGNOSIS — G894 Chronic pain syndrome: Secondary | ICD-10-CM | POA: Insufficient documentation

## 2018-05-31 DIAGNOSIS — Z636 Dependent relative needing care at home: Secondary | ICD-10-CM | POA: Insufficient documentation

## 2018-05-31 DIAGNOSIS — E88819 Insulin resistance, unspecified: Secondary | ICD-10-CM

## 2018-05-31 DIAGNOSIS — M810 Age-related osteoporosis without current pathological fracture: Secondary | ICD-10-CM | POA: Insufficient documentation

## 2018-05-31 DIAGNOSIS — Z9049 Acquired absence of other specified parts of digestive tract: Secondary | ICD-10-CM | POA: Insufficient documentation

## 2018-05-31 DIAGNOSIS — R935 Abnormal findings on diagnostic imaging of other abdominal regions, including retroperitoneum: Secondary | ICD-10-CM | POA: Insufficient documentation

## 2018-05-31 HISTORY — DX: Metabolic syndrome: E88.81

## 2018-05-31 HISTORY — DX: Age-related osteoporosis without current pathological fracture: M81.0

## 2018-05-31 HISTORY — DX: Insulin resistance, unspecified: E88.819

## 2018-05-31 NOTE — Assessment & Plan Note (Signed)
Husband with Alzheimer's and prostate cancer. This is her second marriage, so he has adult children from previous marriage. She does not feel that they are helpful, though she has asked for help. She does not want to place him in SNF because he told her that he did not want to go in the past.

## 2018-05-31 NOTE — Assessment & Plan Note (Signed)
She has been on this regimen for years.

## 2018-05-31 NOTE — Assessment & Plan Note (Signed)
Chart review of images with concern for chronic pancreatitis and cirrhosis. Labs stable. Will discuss with patient and consider further imaging.

## 2018-05-31 NOTE — Assessment & Plan Note (Signed)
Worsened. Caregiver for husband. Son with bladder cancer. No time for herself. Chronic pain. Waiting to see therapist. On Lexapro and it has helped. Discussed options to change medication versus add on. She decided to trial Buspar today. Will consider Cymbalta. Will be careful as patient on Tramadol.

## 2018-05-31 NOTE — Assessment & Plan Note (Signed)
Pain in multiple joints due to OA, concentration at low back and hips. Worsened due to caregiver burden, husband. Previous laminectomy. Recent visit with Dr. Ernestina Patches reviewed. Lumbar xray stable. Consider fibromyalgia as diagnosis. Consider Cymbalta. Status PT. Working on therapy. Tramadol prn. Epidural injections.

## 2018-05-31 NOTE — Assessment & Plan Note (Signed)
Evaluation by Dr. Ernestina Patches in 9/19. A/P: Chronic history of low back pain and radicular pain status post lumbar discectomy and laminectomy by Dr. Melina Schools in 2016.  No recent MRI imaging but no real red flag complaints at this point other than she is getting pain in the low back that is problematic over time it just has her at this point where she is limited in certain things she can do in its been problematic and not relieved with mild amount of tramadol.  X-rays show spondylosis in general changes of the lumbar spine without anything significant.  I do think she is having some radicular complaints that it could be small disc herniation she has some pain with prolonged sitting which she would usually indicate a disc issue but then she also has pain with standing and walking.  I do think she is having facet joint mediated pain as well.  All of this is in a cycle of some situational depression which we know can make pain complaints much worse.  She has no concomitant diagnosis of fibromyalgia although she does carry a diagnosis of irritable bowel syndrome.  I think the best course of action since she has had medication management for some time as well as physical therapy and activity modification is to complete diagnostic note for therapeutic interlaminar epidural steroid injection at L5-S1 from a general approach.  She gets a lot of relief with that and she is doing well we can just follow her.  If it is temporary relief it might be worth getting an updated MRI of the lumbar spine.  Alternatively could look at diagnostic medial branch blocks and radiofrequency ablation for more low back pain in her low back pain really is more problematic than the right leg.  Continued small amount of tramadol is warranted.  Could consider changing the Lexapro to something like duloxetine as it would have a dual purpose of some potential pain relief.  Patient did endorse some relief after injection. Dr. Ernestina Patches recommended PT,  but patient has yet to do this citing issues with leaving her husband at home. Will offer home PT.

## 2018-05-31 NOTE — Assessment & Plan Note (Signed)
Formerly on Simvastatin 40 mg, but stopped due to no clear primary prevention indications. Recent xray with atherosclerosis. Will discuss restarting with patient.   Lab Results  Component Value Date   CHOL 195 11/26/2017   HDL 57.90 11/26/2017   LDLCALC 75 01/26/2016   LDLDIRECT 93.0 11/26/2017   TRIG 238.0 (H) 11/26/2017   CHOLHDL 3 11/26/2017

## 2018-06-01 NOTE — Addendum Note (Signed)
Addended by: Briscoe Deutscher R on: 06/01/2018 03:56 PM   Modules accepted: Orders

## 2018-06-05 ENCOUNTER — Telehealth: Payer: Self-pay | Admitting: Family Medicine

## 2018-06-05 DIAGNOSIS — M4726 Other spondylosis with radiculopathy, lumbar region: Secondary | ICD-10-CM | POA: Diagnosis not present

## 2018-06-05 DIAGNOSIS — Z85118 Personal history of other malignant neoplasm of bronchus and lung: Secondary | ICD-10-CM | POA: Diagnosis not present

## 2018-06-05 DIAGNOSIS — N183 Chronic kidney disease, stage 3 (moderate): Secondary | ICD-10-CM | POA: Diagnosis not present

## 2018-06-05 DIAGNOSIS — Z7982 Long term (current) use of aspirin: Secondary | ICD-10-CM | POA: Diagnosis not present

## 2018-06-05 DIAGNOSIS — I700 Atherosclerosis of aorta: Secondary | ICD-10-CM | POA: Diagnosis not present

## 2018-06-05 DIAGNOSIS — R2689 Other abnormalities of gait and mobility: Secondary | ICD-10-CM | POA: Diagnosis not present

## 2018-06-05 DIAGNOSIS — M150 Primary generalized (osteo)arthritis: Secondary | ICD-10-CM | POA: Diagnosis not present

## 2018-06-05 DIAGNOSIS — G894 Chronic pain syndrome: Secondary | ICD-10-CM | POA: Diagnosis not present

## 2018-06-05 DIAGNOSIS — M5116 Intervertebral disc disorders with radiculopathy, lumbar region: Secondary | ICD-10-CM | POA: Diagnosis not present

## 2018-06-05 DIAGNOSIS — Z636 Dependent relative needing care at home: Secondary | ICD-10-CM | POA: Diagnosis not present

## 2018-06-05 DIAGNOSIS — M810 Age-related osteoporosis without current pathological fracture: Secondary | ICD-10-CM | POA: Diagnosis not present

## 2018-06-05 NOTE — Telephone Encounter (Signed)
Okay for orders? 

## 2018-06-05 NOTE — Telephone Encounter (Signed)
Okay for verbal orders? Please advise 

## 2018-06-05 NOTE — Telephone Encounter (Signed)
See note

## 2018-06-05 NOTE — Telephone Encounter (Signed)
Copied from [place] #[id number]. Topic: Quick Communication - Home Health Verbal Orders >> Jun 05, 2018  3:15 PM Vernona Rieger wrote: Caller/Agency: [facility] [place] Number: [phone] Requesting OT/PT/Skilled Nursing/Social Work:  physical therapy  Frequency: 1 week 1, 2 week 3, 1 week 2

## 2018-06-08 ENCOUNTER — Telehealth: Payer: Self-pay | Admitting: Family Medicine

## 2018-06-08 NOTE — Telephone Encounter (Signed)
Copied from [place] [id number]. Topic: Quick Communication - Home Health Verbal Orders >> Jun 08, 2018  2:11 PM Cecelia Byars, NT wrote: Caller/Agency: [place] [facility] Number: [phone] Requesting  /PT Frequency: 1 week 2 2 week 4  1 week 1 to work on gait balance and strengthening

## 2018-06-08 NOTE — Telephone Encounter (Signed)
See note

## 2018-06-09 NOTE — Telephone Encounter (Signed)
Okay for verbal orders? Please advise 

## 2018-06-09 NOTE — Telephone Encounter (Signed)
Verbal orders given to [place] via confidential vm. No further action needed!

## 2018-06-09 NOTE — Telephone Encounter (Signed)
Okay 

## 2018-06-09 NOTE — Telephone Encounter (Signed)
Verbal orders given to [facility] 

## 2018-06-11 DIAGNOSIS — M5116 Intervertebral disc disorders with radiculopathy, lumbar region: Secondary | ICD-10-CM | POA: Diagnosis not present

## 2018-06-11 DIAGNOSIS — M4726 Other spondylosis with radiculopathy, lumbar region: Secondary | ICD-10-CM | POA: Diagnosis not present

## 2018-06-11 DIAGNOSIS — R2689 Other abnormalities of gait and mobility: Secondary | ICD-10-CM | POA: Diagnosis not present

## 2018-06-11 DIAGNOSIS — Z7982 Long term (current) use of aspirin: Secondary | ICD-10-CM | POA: Diagnosis not present

## 2018-06-11 DIAGNOSIS — G894 Chronic pain syndrome: Secondary | ICD-10-CM | POA: Diagnosis not present

## 2018-06-11 DIAGNOSIS — M810 Age-related osteoporosis without current pathological fracture: Secondary | ICD-10-CM | POA: Diagnosis not present

## 2018-06-11 DIAGNOSIS — M150 Primary generalized (osteo)arthritis: Secondary | ICD-10-CM | POA: Diagnosis not present

## 2018-06-11 DIAGNOSIS — Z85118 Personal history of other malignant neoplasm of bronchus and lung: Secondary | ICD-10-CM | POA: Diagnosis not present

## 2018-06-11 DIAGNOSIS — Z636 Dependent relative needing care at home: Secondary | ICD-10-CM | POA: Diagnosis not present

## 2018-06-11 DIAGNOSIS — I700 Atherosclerosis of aorta: Secondary | ICD-10-CM | POA: Diagnosis not present

## 2018-06-11 DIAGNOSIS — N183 Chronic kidney disease, stage 3 (moderate): Secondary | ICD-10-CM | POA: Diagnosis not present

## 2018-06-15 ENCOUNTER — Encounter: Payer: Self-pay | Admitting: Physician Assistant

## 2018-06-15 ENCOUNTER — Ambulatory Visit (INDEPENDENT_AMBULATORY_CARE_PROVIDER_SITE_OTHER): Payer: Medicare Other | Admitting: Physician Assistant

## 2018-06-15 ENCOUNTER — Ambulatory Visit: Payer: Self-pay | Admitting: *Deleted

## 2018-06-15 VITALS — BP 140/74 | HR 65 | Temp 98.4°F | Ht 63.0 in | Wt 137.0 lb

## 2018-06-15 DIAGNOSIS — R52 Pain, unspecified: Secondary | ICD-10-CM | POA: Diagnosis not present

## 2018-06-15 DIAGNOSIS — J029 Acute pharyngitis, unspecified: Secondary | ICD-10-CM | POA: Diagnosis not present

## 2018-06-15 LAB — POC INFLUENZA A&B (BINAX/QUICKVUE)
INFLUENZA A, POC: NEGATIVE
INFLUENZA B, POC: NEGATIVE

## 2018-06-15 NOTE — Progress Notes (Signed)
Krystal Craig is a 84 y.o. female here for a new problem.  I acted as a Education administrator for [employer], PA-C Anselmo Pickler, LPN  History of Present Illness:   Chief Complaint  Patient presents with  . Sore Throat  . Generalized Body Aches    Sore Throat   This is a new problem. Episode onset: Started last night. The problem has been gradually worsening. Neither side of throat is experiencing more pain than the other. There has been no fever. Associated symptoms include headaches and a hoarse voice. Pertinent negatives include no diarrhea, neck pain, shortness of breath or trouble swallowing. Associated symptoms comments: Body aches, post nasal drip. Exposure to: Daughter and Yolanda Bonine sick also. She has tried acetaminophen for the symptoms. The treatment provided no relief.   Daughter and grandson are at the doctor now, she is unaware of their diagnoses.  Denies chest pain or any urinary symptoms.  Past Medical History:  Diagnosis Date  . Allergic rhinitis   . Chest pain with normal stress test, 2013   . Chronic kidney disease, stage III (moderate) (HCC)   . Complication of anesthesia   . Diaphragmatic hernia 06/26/2007  . Diverticulosis   . Hiatal hernia   . History of bronchitis   . IBS (irritable bowel syndrome)   . Impacted cerumen of both ears   . Insomnia   . Lumbar back pain   . Major depression in partial remission ([place])   . Malignant neoplasm of bronchus and lung, 2006   . Osteoarthritis   . Osteopenia   . PONV (postoperative nausea and vomiting)   . Pre-diabetes   . Psoriasis   . Pure hypercholesterolemia   . Vitamin D deficiency      Social History   Socioeconomic History  . Marital status: Married    Spouse name: Not on file  . Number of children: Not on file  . Years of education: Not on file  . Highest education level: Not on file  Occupational History  . Not on file  Social Needs  . Financial resource strain: Not on file  . Food insecurity:     Worry: Not on file    Inability: Not on file  . Transportation needs:    Medical: Not on file    Non-medical: Not on file  Tobacco Use  . Smoking status: Former Smoker    Packs/day: 1.00    Years: 30.00    Pack years: 30.00    Types: Cigarettes    Last attempt to quit: 06/17/1984    Years since quitting: 34.0  . Smokeless tobacco: Never Used  Substance and Sexual Activity  . Alcohol use: No    Alcohol/week: 0.0 standard drinks  . Drug use: No  . Sexual activity: Not Currently    Birth control/protection: Post-menopausal    Comment: 1st intercourse 4 yo-2 partners  Lifestyle  . Physical activity:    Days per week: Not on file    Minutes per session: Not on file  . Stress: Not on file  Relationships  . Social connections:    Talks on phone: Not on file    Gets together: Not on file    Attends religious service: Not on file    Active member of club or organization: Not on file    Attends meetings of clubs or organizations: Not on file    Relationship status: Not on file  . Intimate partner violence:    Fear of current or ex  partner: Not on file    Emotionally abused: Not on file    Physically abused: Not on file    Forced sexual activity: Not on file  Other Topics Concern  . Not on file  Social History Narrative   Married (2nd marriage; 1st husband died at age 51) married 88 29 years in 05/2014. 2 sons from first marriage, 3 from 2nd (step). 15 grandchildren. 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 Hobbies: reading, golf, family time.    Past Surgical History:  Procedure Laterality Date  . ABDOMINAL HYSTERECTOMY  1969  . APPENDECTOMY    . bilateral shouler operations for impingement syndromes Bilateral 2001  . BUNIONECTOMY Left   . CATARACT EXTRACTION, BILATERAL Bilateral 2012  . CHOLECYSTECTOMY  1976  . LAPAROSCOPIC LYSIS INTESTINAL ADHESIONS    . left lower lobectomy  11/2004   via VATS and mini-thoractomy for 1.5cm adenoca of lung by Dr. Arlyce Dice  . LUMBAR  LAMINECTOMY/DECOMPRESSION MICRODISCECTOMY Left 01/05/2015   Procedure: L3-4 DECOMPRESSION MICRODISCECTOMY ON LEFT;  Surgeon: Melina Schools, MD;  Location: [facility];  Service: Orthopedics;  Laterality: Left;  left Lumbar 3-4  . turmor ear Right 09/2013  . urologic sugery with cystocele/rectocele repairs and sling  03/2007   Dr. Terance Hart    Family History  Problem Relation Age of Onset  . Breast cancer Sister 42  . Dementia Mother   . Heart attack Mother   . CVA Father   . Stroke Father   . Colon cancer Neg Hx   . Esophageal cancer Neg Hx   . Pancreatic cancer Neg Hx   . Rectal cancer Neg Hx   . Stomach cancer Neg Hx     Allergies  Allergen Reactions  . Ciprocin-Fluocin-Procin [Fluocinolone] Other (See Comments)    Leg and arm numbness  . Codeine     Chest pain/tightness  . Wellbutrin [Bupropion] Rash    Current Medications:   Current Outpatient Medications:  .  acetaminophen (TYLENOL ARTHRITIS PAIN) 650 MG CR tablet, Take 650 mg by mouth every 8 (eight) hours as needed for pain., Disp: , Rfl:  .  aspirin 81 MG tablet, Take 81 mg by mouth daily., Disp: , Rfl:  .  busPIRone (BUSPAR) 7.5 MG tablet, Take 1 tablet (7.5 mg total) by mouth 2 (two) times daily., Disp: 60 tablet, Rfl: 2 .  Calcium Carbonate-Vitamin D (CALCIUM-VITAMIN D) 500-200 MG-UNIT tablet, Take 1 tablet by mouth daily., Disp: , Rfl:  .  clonazePAM (KLONOPIN) 0.5 MG tablet, Take 1 tablet (0.5 mg total) by mouth 3 (three) times daily as needed for anxiety., Disp: 30 tablet, Rfl: 0 .  conjugated estrogens (PREMARIN) vaginal cream, Place 1 Applicatorful vaginally as needed., Disp: , Rfl:  .  Cranberry-Vitamin C-Probiotic (AZO CRANBERRY PO), Take by mouth as directed., Disp: , Rfl:  .  escitalopram (LEXAPRO) 10 MG tablet, Take 1 tablet (10 mg total) by mouth daily., Disp: 90 tablet, Rfl: 1 .  metoCLOPramide (REGLAN) 10 MG tablet, Take 1 tablet (10 mg total) by mouth 4 (four) times daily., Disp: 60 tablet, Rfl: 0 .  Multiple  Vitamins-Minerals (MULTIVITAMIN WITH MINERALS) tablet, Take 1 tablet by mouth daily., Disp: , Rfl:  .  polyethylene glycol (MIRALAX / GLYCOLAX) packet, Take 17 g by mouth daily., Disp: , Rfl:  .  Probiotic Product (PROBIOTIC-10) CAPS, Take by mouth., Disp: , Rfl:  .  simvastatin (ZOCOR) 40 MG tablet, Take 1 tablet (40 mg total) by mouth at bedtime., Disp: 90 tablet, Rfl: 3 .  traMADol (ULTRAM) 50 MG tablet, Take 1 tablet (50 mg total) by mouth every 6 (six) hours as needed., Disp: 90 tablet, Rfl: 2 .  zolpidem (AMBIEN) 10 MG tablet, Take 1 tablet (10 mg total) by mouth at bedtime as needed for sleep., Disp: 30 tablet, Rfl: 2   Review of Systems:   Review of Systems  HENT: Positive for hoarse voice. Negative for trouble swallowing.   Respiratory: Negative for shortness of breath.   Gastrointestinal: Negative for diarrhea.  Musculoskeletal: Negative for neck pain.  Neurological: Positive for headaches.    Vitals:   Vitals:   06/15/18 1456  BP: 140/74  Pulse: 65  Temp: 98.4 F (36.9 C)  TempSrc: Oral  SpO2: 97%  Weight: 137 lb (62.1 kg)  Height: 5\' 3"  (1.6 m)     Body mass index is 24.27 kg/m.  Physical Exam:   Physical Exam Vitals signs and nursing note reviewed.  Constitutional:      General: She is not in acute distress.    Appearance: She is well-developed. She is not ill-appearing or toxic-appearing.  HENT:     Head: Normocephalic and atraumatic.     Right Ear: Tympanic membrane, ear canal and external ear normal. Tympanic membrane is not erythematous, retracted or bulging.     Left Ear: Tympanic membrane, ear canal and external ear normal. Tympanic membrane is not erythematous, retracted or bulging.     Nose: Mucosal edema, congestion and rhinorrhea present.     Right Sinus: No maxillary sinus tenderness or frontal sinus tenderness.     Left Sinus: No maxillary sinus tenderness or frontal sinus tenderness.     Mouth/Throat:     Pharynx: Uvula midline. Posterior  oropharyngeal erythema present.     Tonsils: No tonsillar exudate. Swelling: 1+ on the right. 1+ on the left.  Eyes:     General: Lids are normal.     Conjunctiva/sclera: Conjunctivae normal.  Neck:     Trachea: Trachea normal.  Cardiovascular:     Rate and Rhythm: Normal rate and regular rhythm.     Heart sounds: Normal heart sounds, S1 normal and S2 normal.  Pulmonary:     Effort: Pulmonary effort is normal.     Breath sounds: Normal breath sounds. No decreased breath sounds, wheezing, rhonchi or rales.  Lymphadenopathy:     Cervical: No cervical adenopathy.  Skin:    General: Skin is warm and dry.  Neurological:     Mental Status: She is alert.  Psychiatric:        Speech: Speech normal.        Behavior: Behavior normal. Behavior is cooperative.     Results for orders placed or performed in visit on 06/15/18  POC Influenza A&B(BINAX/QUICKVUE)  Result Value Ref Range   Influenza A, POC Negative Negative   Influenza B, POC Negative Negative    Assessment and Plan:   Marijean was seen today for sore throat and generalized body aches.  Diagnoses and all orders for this visit:  Sore throat -     POC Influenza A&B(BINAX/QUICKVUE)  Generalized body aches -     POC Influenza A&B(BINAX/QUICKVUE)   Flu test negative. Continue supportive care. No red flags on my exam today. Reviewed return precautions including worsening fever, SOB, worsening cough or other concerns. Push fluids and rest. I recommend that patient follow-up if symptoms worsen or persist despite treatment x 7-10 days, sooner if needed.  I did advise her that if her family members test positive  flu, to call the office and we can send in prophylaxis if needed.  . Reviewed expectations re: course of current medical issues. . Discussed self-management of symptoms. . Outlined signs and symptoms indicating need for more acute intervention. . Patient verbalized understanding and all questions were answered. . See  orders for this visit as documented in the electronic medical record. . Patient received an After-Visit Summary.  CMA or LPN served as scribe during this visit. History, Physical, and Plan performed by medical provider. The above documentation has been reviewed and is accurate and complete.  [name], PA-C

## 2018-06-15 NOTE — Patient Instructions (Signed)
It was great to see you!  You have a viral upper respiratory infection. Antibiotics are not needed for this.  Viral infections usually take 7-10 days to resolve.  The cough can last a few weeks to go away.  If your family has positive flu symptoms, please call our office.  Push fluids and get plenty of rest. Please return if you are not improving as expected, or if you have high fevers (>101.5) or difficulty swallowing or worsening productive cough.  Call clinic with questions.  I hope you start feeling better soon!

## 2018-06-15 NOTE — Telephone Encounter (Signed)
See note

## 2018-06-15 NOTE — Telephone Encounter (Signed)
Pt calling stating that she has a sore throat headache sneezing and body ache and would like something for it she is a [employer] [address], [place] [phone] (Phone) [phone] (Fax)  Patient did have flu shot - she started having symptoms this morning. She is feeling some better this afternoon. She has not taken anything for her congestion- just Tylenol. Patient is 64- she is high risk- call to office- patient scheduled. Reason for Disposition . Patient is HIGH RISK (e.g., age > 4 years, pregnant, HIV+, or chronic medical condition)  Answer Assessment - Initial Assessment Questions 1. WORST SYMPTOM: "What is your worst symptom?" (e.g., cough, runny nose, muscle aches, headache, sore throat, fever)      Runny nose, muscle aches, headache, started with sore throat- not now 2. ONSET: "When did your flu symptoms start?"      All started last night 3. COUGH: "How bad is the cough?"       Coughing when talks 4. RESPIRATORY DISTRESS: "Describe your breathing."      Not effected breathing 5. FEVER: "Do you have a fever?" If so, ask: "What is your temperature, how was it measured, and when did it start?"     98.1 6. EXPOSURE: "Were you exposed to someone with influenza?"       Not that she is aware 7. FLU VACCINE: "Did you get a flu shot this year?"     yes 8. HIGH RISK DISEASE: "Do you any chronic medical problems?" (e.g., heart or lung disease, asthma, weak immune system, or other HIGH RISK conditions)     age 78. PREGNANCY: "Is there any chance you are pregnant?" "When was your last menstrual period?"     n/a 10. OTHER SYMPTOMS: "Do you have any other symptoms?"  (e.g., runny nose, muscle aches, headache, sore throat)       No other symptoms  Protocols used: INFLUENZA - SEASONAL-A-AH

## 2018-06-16 NOTE — Telephone Encounter (Signed)
Pt was seen by [facility] yesterday

## 2018-06-18 ENCOUNTER — Telehealth: Payer: Self-pay | Admitting: Family Medicine

## 2018-06-18 NOTE — Telephone Encounter (Signed)
Called spoke to [place] given verbal orders to hold pt for one week

## 2018-06-18 NOTE — Telephone Encounter (Signed)
Copied from [place] [id number]. Topic: Quick Communication - Home Health Verbal Orders >> Jun 18, 2018  2:50 PM Alanda Slim E wrote: Caller/Agency: [facility] Number: [phone] Requesting PT be put on hold this week due to Pt having cold symptoms and not feeling well. Pt will resume on 01.06.2020

## 2018-06-18 NOTE — Telephone Encounter (Signed)
See note

## 2018-06-22 DIAGNOSIS — N183 Chronic kidney disease, stage 3 (moderate): Secondary | ICD-10-CM | POA: Diagnosis not present

## 2018-06-22 DIAGNOSIS — M810 Age-related osteoporosis without current pathological fracture: Secondary | ICD-10-CM | POA: Diagnosis not present

## 2018-06-22 DIAGNOSIS — R2689 Other abnormalities of gait and mobility: Secondary | ICD-10-CM | POA: Diagnosis not present

## 2018-06-22 DIAGNOSIS — Z636 Dependent relative needing care at home: Secondary | ICD-10-CM | POA: Diagnosis not present

## 2018-06-22 DIAGNOSIS — M5116 Intervertebral disc disorders with radiculopathy, lumbar region: Secondary | ICD-10-CM | POA: Diagnosis not present

## 2018-06-22 DIAGNOSIS — G894 Chronic pain syndrome: Secondary | ICD-10-CM | POA: Diagnosis not present

## 2018-06-22 DIAGNOSIS — M150 Primary generalized (osteo)arthritis: Secondary | ICD-10-CM | POA: Diagnosis not present

## 2018-06-22 DIAGNOSIS — I700 Atherosclerosis of aorta: Secondary | ICD-10-CM | POA: Diagnosis not present

## 2018-06-22 DIAGNOSIS — M4726 Other spondylosis with radiculopathy, lumbar region: Secondary | ICD-10-CM | POA: Diagnosis not present

## 2018-06-22 DIAGNOSIS — Z85118 Personal history of other malignant neoplasm of bronchus and lung: Secondary | ICD-10-CM | POA: Diagnosis not present

## 2018-06-22 DIAGNOSIS — Z7982 Long term (current) use of aspirin: Secondary | ICD-10-CM | POA: Diagnosis not present

## 2018-06-25 DIAGNOSIS — N183 Chronic kidney disease, stage 3 (moderate): Secondary | ICD-10-CM | POA: Diagnosis not present

## 2018-06-25 DIAGNOSIS — R2689 Other abnormalities of gait and mobility: Secondary | ICD-10-CM | POA: Diagnosis not present

## 2018-06-25 DIAGNOSIS — M5116 Intervertebral disc disorders with radiculopathy, lumbar region: Secondary | ICD-10-CM | POA: Diagnosis not present

## 2018-06-25 DIAGNOSIS — Z636 Dependent relative needing care at home: Secondary | ICD-10-CM | POA: Diagnosis not present

## 2018-06-25 DIAGNOSIS — I700 Atherosclerosis of aorta: Secondary | ICD-10-CM | POA: Diagnosis not present

## 2018-06-25 DIAGNOSIS — Z7982 Long term (current) use of aspirin: Secondary | ICD-10-CM | POA: Diagnosis not present

## 2018-06-25 DIAGNOSIS — Z85118 Personal history of other malignant neoplasm of bronchus and lung: Secondary | ICD-10-CM | POA: Diagnosis not present

## 2018-06-25 DIAGNOSIS — M4726 Other spondylosis with radiculopathy, lumbar region: Secondary | ICD-10-CM | POA: Diagnosis not present

## 2018-06-25 DIAGNOSIS — G894 Chronic pain syndrome: Secondary | ICD-10-CM | POA: Diagnosis not present

## 2018-06-25 DIAGNOSIS — M150 Primary generalized (osteo)arthritis: Secondary | ICD-10-CM | POA: Diagnosis not present

## 2018-06-25 DIAGNOSIS — M810 Age-related osteoporosis without current pathological fracture: Secondary | ICD-10-CM | POA: Diagnosis not present

## 2018-06-26 ENCOUNTER — Telehealth: Payer: Self-pay

## 2018-06-26 NOTE — Telephone Encounter (Signed)
PPW in your folder for review.

## 2018-06-29 ENCOUNTER — Ambulatory Visit (INDEPENDENT_AMBULATORY_CARE_PROVIDER_SITE_OTHER): Payer: Medicare Other | Admitting: Family Medicine

## 2018-06-29 ENCOUNTER — Encounter: Payer: Self-pay | Admitting: Family Medicine

## 2018-06-29 VITALS — BP 140/72 | HR 75 | Temp 98.5°F | Ht 63.0 in | Wt 135.0 lb

## 2018-06-29 DIAGNOSIS — R110 Nausea: Secondary | ICD-10-CM

## 2018-06-29 DIAGNOSIS — J0110 Acute frontal sinusitis, unspecified: Secondary | ICD-10-CM | POA: Diagnosis not present

## 2018-06-29 DIAGNOSIS — R0982 Postnasal drip: Secondary | ICD-10-CM

## 2018-06-29 LAB — POC URINALSYSI DIPSTICK (AUTOMATED)
Bilirubin, UA: NEGATIVE
Blood, UA: NEGATIVE
Glucose, UA: NEGATIVE
Ketones, UA: NEGATIVE
Leukocytes, UA: NEGATIVE
Nitrite, UA: NEGATIVE
Protein, UA: POSITIVE — AB
Spec Grav, UA: 1.015 (ref 1.010–1.025)
Urobilinogen, UA: 0.2 E.U./dL
pH, UA: 8 (ref 5.0–8.0)

## 2018-06-29 MED ORDER — METOCLOPRAMIDE HCL 10 MG PO TABS: 10.0000 mg | ORAL_TABLET | Freq: Four times a day (QID) | ORAL | 0 refills | Status: DC

## 2018-06-29 MED ORDER — AZELASTINE HCL 0.1 % NA SOLN: 1.0000 | Freq: Two times a day (BID) | NASAL | 12 refills | Status: DC

## 2018-06-29 MED ORDER — AMOXICILLIN 875 MG PO TABS: 875.0000 mg | ORAL_TABLET | Freq: Two times a day (BID) | ORAL | 0 refills | Status: DC

## 2018-06-29 NOTE — Telephone Encounter (Signed)
Completed and given to front staff. 06/29/2018

## 2018-06-29 NOTE — Progress Notes (Signed)
Krystal Craig is a 85 y.o. female here for an acute visit.  History of Present Illness:   Krystal Craig, CMA acting as scribe for Dr. Briscoe Deutscher.   HPI: Patient in office for evaluation of nausea with no vomiting. Symptoms started five days ago. She denies any cough or fever. She has had some fullness in right ear.   PMHx, SurgHx, SocialHx, Medications, and Allergies were reviewed in the Visit Navigator and updated as appropriate.  Current Medications:   .  acetaminophen (TYLENOL ARTHRITIS PAIN) 650 MG CR tablet, Take 650 mg by mouth every 8 (eight) hours as needed for pain., Disp: , Rfl:  .  aspirin 81 MG tablet, Take 81 mg by mouth daily., Disp: , Rfl:  .  Calcium Carbonate-Vitamin D (CALCIUM-VITAMIN D) 500-200 MG-UNIT tablet, Take 1 tablet by mouth daily., Disp: , Rfl:  .  clonazePAM (KLONOPIN) 0.5 MG tablet, Take 1 tablet (0.5 mg total) by mouth 3 (three) times daily as needed for anxiety., Disp: 30 tablet, Rfl: 0 .  conjugated estrogens (PREMARIN) vaginal cream, Place 1 Applicatorful vaginally as needed., Disp: , Rfl:  .  Cranberry-Vitamin C-Probiotic (AZO CRANBERRY PO), Take by mouth as directed., Disp: , Rfl:  .  escitalopram (LEXAPRO) 10 MG tablet, Take 1 tablet (10 mg total) by mouth daily., Disp: 90 tablet, Rfl: 1 .  metoCLOPramide (REGLAN) 10 MG tablet, Take 1 tablet (10 mg total) by mouth 4 (four) times daily., Disp: 60 tablet, Rfl: 0 .  Multiple Vitamins-Minerals (MULTIVITAMIN WITH MINERALS) tablet, Take 1 tablet by mouth daily., Disp: , Rfl:  .  polyethylene glycol (MIRALAX / GLYCOLAX) packet, Take 17 g by mouth daily., Disp: , Rfl:  .  Probiotic Product (PROBIOTIC-10) CAPS, Take by mouth., Disp: , Rfl:  .  simvastatin (ZOCOR) 40 MG tablet, Take 1 tablet (40 mg total) by mouth at bedtime., Disp: 90 tablet, Rfl: 3 .  traMADol (ULTRAM) 50 MG tablet, Take 1 tablet (50 mg total) by mouth every 6 (six) hours as needed., Disp: 90 tablet, Rfl: 2 .  zolpidem (AMBIEN) 10 MG  tablet, Take 1 tablet (10 mg total) by mouth at bedtime as needed for sleep., Disp: 30 tablet, Rfl: 2   Allergies  Allergen Reactions  . Ciprocin-Fluocin-Procin [Fluocinolone] Other (See Comments)    Leg and arm numbness  . Codeine     Chest pain/tightness  . Wellbutrin [Bupropion] Rash   Review of Systems:   Pertinent items are noted in the HPI. Otherwise, ROS is negative.  Vitals:   Vitals:   06/29/18 1010  BP: 140/72  Pulse: 75  Temp: 98.5 F (36.9 C)  TempSrc: Oral  SpO2: 96%  Weight: 135 lb (61.2 kg)  Height: 5\' 3"  (1.6 m)     Body mass index is 23.91 kg/m.  Physical Exam:   Physical Exam Vitals signs and nursing note reviewed.  HENT:     Head: Normocephalic and atraumatic.     Nose: Mucosal edema and rhinorrhea present.     Right Sinus: Maxillary sinus tenderness present.     Left Sinus: Maxillary sinus tenderness present.     Mouth/Throat:     Mouth: Mucous membranes are moist.     Pharynx: Posterior oropharyngeal erythema present.  Eyes:     Pupils: Pupils are equal, round, and reactive to light.  Neck:     Musculoskeletal: Normal range of motion and neck supple.  Cardiovascular:     Rate and Rhythm: Normal rate and regular rhythm.  Heart sounds: Normal heart sounds.  Pulmonary:     Effort: Pulmonary effort is normal.  Abdominal:     Palpations: Abdomen is soft.  Skin:    General: Skin is warm.  Psychiatric:        Behavior: Behavior normal.    Assessment and Plan:   Adalai was seen today for nausea and nasal congestion.  Diagnoses and all orders for this visit:  Nausea -     POCT Urinalysis Dipstick (Automated) -     metoCLOPramide (REGLAN) 10 MG tablet; Take 1 tablet (10 mg total) by mouth 4 (four) times daily.  PND (post-nasal drip) -     azelastine (ASTELIN) 0.1 % nasal spray; Place 1 spray into both nostrils 2 (two) times daily. Use in each nostril as directed  Subacute frontal sinusitis -     amoxicillin (AMOXIL) 875 MG  tablet; Take 1 tablet (875 mg total) by mouth 2 (two) times daily.   . Reviewed expectations re: course of current medical issues. . Discussed self-management of symptoms. . Outlined signs and symptoms indicating need for more acute intervention. . Patient verbalized understanding and all questions were answered. Marland Kitchen Health Maintenance issues including appropriate healthy diet, exercise, and smoking avoidance were discussed with patient. . See orders for this visit as documented in the electronic medical record. . Patient received an After Visit Summary.  CMA served as Education administrator during this visit. History, Physical, and Plan performed by medical provider. The above documentation has been reviewed and is accurate and complete. Briscoe Deutscher, D.O.  Briscoe Deutscher, DO [place], [facility] 07/01/2018

## 2018-06-30 ENCOUNTER — Telehealth: Payer: Self-pay

## 2018-06-30 NOTE — Telephone Encounter (Signed)
Called pt per Dr.Wallace to make sure she is feeling better. No answer will call again shortly.

## 2018-06-30 NOTE — Telephone Encounter (Signed)
Pt returned phone call and she stated that she is feeling much better. Pt will call if sx start again.

## 2018-07-01 ENCOUNTER — Ambulatory Visit: Payer: Medicare Other | Admitting: Family Medicine

## 2018-07-01 DIAGNOSIS — M150 Primary generalized (osteo)arthritis: Secondary | ICD-10-CM

## 2018-07-01 DIAGNOSIS — M810 Age-related osteoporosis without current pathological fracture: Secondary | ICD-10-CM

## 2018-07-01 DIAGNOSIS — I700 Atherosclerosis of aorta: Secondary | ICD-10-CM

## 2018-07-01 DIAGNOSIS — N183 Chronic kidney disease, stage 3 (moderate): Secondary | ICD-10-CM

## 2018-07-01 DIAGNOSIS — Z7982 Long term (current) use of aspirin: Secondary | ICD-10-CM

## 2018-07-01 DIAGNOSIS — Z636 Dependent relative needing care at home: Secondary | ICD-10-CM

## 2018-07-01 DIAGNOSIS — Z85118 Personal history of other malignant neoplasm of bronchus and lung: Secondary | ICD-10-CM

## 2018-07-01 DIAGNOSIS — M5116 Intervertebral disc disorders with radiculopathy, lumbar region: Secondary | ICD-10-CM | POA: Diagnosis not present

## 2018-07-01 DIAGNOSIS — R2689 Other abnormalities of gait and mobility: Secondary | ICD-10-CM | POA: Diagnosis not present

## 2018-07-01 DIAGNOSIS — R0982 Postnasal drip: Secondary | ICD-10-CM | POA: Insufficient documentation

## 2018-07-01 DIAGNOSIS — G894 Chronic pain syndrome: Secondary | ICD-10-CM | POA: Diagnosis not present

## 2018-07-01 DIAGNOSIS — M4726 Other spondylosis with radiculopathy, lumbar region: Secondary | ICD-10-CM | POA: Diagnosis not present

## 2018-07-01 DIAGNOSIS — F418 Other specified anxiety disorders: Secondary | ICD-10-CM

## 2018-07-01 DIAGNOSIS — F329 Major depressive disorder, single episode, unspecified: Secondary | ICD-10-CM

## 2018-07-03 ENCOUNTER — Telehealth: Payer: Self-pay | Admitting: Family Medicine

## 2018-07-03 NOTE — Telephone Encounter (Signed)
FYI

## 2018-07-03 NOTE — Telephone Encounter (Signed)
Copied from [place] [id number]. Topic: Quick Communication - Home Health Verbal Orders >> Jul 03, 2018 12:27 PM Sheran Luz wrote: Caller/Agency: [facility] Number: [phone]   Physical therapist with [facility] calling to inform PCP that patient refused PT due to patient not feeling well today. [facility] would like to know if they could add this to "end of episode" and extend PT. Please advise.

## 2018-07-03 NOTE — Telephone Encounter (Signed)
See note

## 2018-07-07 ENCOUNTER — Telehealth: Payer: Self-pay | Admitting: Family Medicine

## 2018-07-07 ENCOUNTER — Other Ambulatory Visit: Payer: Self-pay

## 2018-07-07 ENCOUNTER — Ambulatory Visit: Payer: Self-pay | Admitting: *Deleted

## 2018-07-07 MED ORDER — ONDANSETRON 4 MG PO TBDP: 4.0000 mg | ORAL_TABLET | Freq: Three times a day (TID) | ORAL | 0 refills | Status: DC | PRN

## 2018-07-07 NOTE — Telephone Encounter (Signed)
See note

## 2018-07-07 NOTE — Telephone Encounter (Signed)
Pt reports seen by Dr. Juleen China 06/29/2018, placed on amoxicillin for sinusitis. States today is last dose. Pt reports she is still experiencing nausea. "Comes and goes" severe yesterday and this AM. Denies vomiting, diarrhea. States is staying hydrated, voiding WNL, eating bland foods. States Dr. Juleen China had mentioned prescription for nausea but at appt pt declined.  Pt would now like medication called in to:   [facility] on [address] CB # [id number]  Reason for Disposition . Caller requesting a NON-URGENT new prescription or refill and triager unable to refill per unit policy  Answer Assessment - Initial Assessment Questions 1. SYMPTOMS: "Do you have any symptoms?"     Yes nausea, no vomiting 2. SEVERITY: If symptoms are present, ask "Are they mild, moderate or severe?"     Severe today  Protocols used: MEDICATION QUESTION CALL-A-AH

## 2018-07-07 NOTE — Addendum Note (Signed)
Addended by: Briscoe Deutscher R on: 07/07/2018 11:58 AM   Modules accepted: Orders

## 2018-07-07 NOTE — Telephone Encounter (Signed)
Spoke to pt told her Dr.  Juleen China sent in [place] for nausea to the pharmacy for you. Pt verbalized understanding.

## 2018-07-07 NOTE — Telephone Encounter (Signed)
Called in meds let patient know

## 2018-07-07 NOTE — Telephone Encounter (Signed)
Zofran sent to pharmacy

## 2018-07-07 NOTE — Telephone Encounter (Signed)
Copied from [place]. Topic: Quick Communication - See Telephone Encounter >> Jul 07, 2018  4:38 PM Valla Leaver wrote: CRM for notification. See Telephone encounter for: 07/07/18. Zofran sent to wrong pharmacy. See Nurse Triage TE from today. [place], [place], [place] [address], Phone: [phone] Fax: [phone]

## 2018-07-07 NOTE — Telephone Encounter (Signed)
Ok to call in

## 2018-07-08 ENCOUNTER — Encounter: Payer: Self-pay | Admitting: Physician Assistant

## 2018-07-08 ENCOUNTER — Ambulatory Visit (INDEPENDENT_AMBULATORY_CARE_PROVIDER_SITE_OTHER): Payer: Medicare Other | Admitting: Physician Assistant

## 2018-07-08 ENCOUNTER — Ambulatory Visit (INDEPENDENT_AMBULATORY_CARE_PROVIDER_SITE_OTHER): Payer: Medicare Other

## 2018-07-08 ENCOUNTER — Telehealth: Payer: Self-pay | Admitting: Family Medicine

## 2018-07-08 VITALS — BP 160/80 | HR 77 | Temp 98.5°F | Ht 63.0 in | Wt 133.2 lb

## 2018-07-08 DIAGNOSIS — N200 Calculus of kidney: Secondary | ICD-10-CM | POA: Diagnosis not present

## 2018-07-08 DIAGNOSIS — R110 Nausea: Secondary | ICD-10-CM

## 2018-07-08 MED ORDER — PROMETHAZINE HCL 12.5 MG PO TABS: 12.5000 mg | ORAL_TABLET | Freq: Three times a day (TID) | ORAL | 0 refills | Status: DC | PRN

## 2018-07-08 NOTE — Telephone Encounter (Signed)
Patient calling back because she took one zofran last night and 2 this morning, but she is still "so nauseous she can't think". Patient wants to know if this is normal or if something else can be called in?

## 2018-07-08 NOTE — Telephone Encounter (Signed)
Spoke to patient and per Dr.Wallace patient should be seen today. Patient has been scheduled for today to see Jasper Loser, PA

## 2018-07-08 NOTE — Telephone Encounter (Signed)
Copied from [place] [id number]. Topic: General - Other >> Jul 08, 2018 12:52 PM Yvette Rack wrote: Reason for CRM: PT Liji from [facility] calling [phone] calling stating that pt wasn't feeling well and want to put PT on hole for this week and to add visit to the end of the episode

## 2018-07-08 NOTE — Patient Instructions (Signed)
It was great to see you!  We will call you with lab results.   Nausea, Adult Nausea is feeling sick to your stomach or feeling that you are about to throw up (vomit). Feeling sick to your stomach is usually not serious, but it may be an early sign of a more serious medical problem. As you feel sicker to your stomach, you may throw up. If you throw up, or if you are not able to drink enough fluids, there is a risk that you may lose too much water in your body (get dehydrated). If you lose too much water in your body, you may:  Feel tired.  Feel thirsty.  Have a dry mouth.  Have cracked lips.  Go pee (urinate) less often. Older adults and people who have other diseases or a weak body defense system (immune system) have a higher risk of losing too much water in the body. The main goals of treating this condition are:  To relieve your nausea.  To ensure your nausea occurs less often.  To prevent throwing up and losing too much fluid. Follow these instructions at home: Watch your symptoms for any changes. Tell your doctor about them. Follow these instructions as told by your doctor. Eating and drinking      Take an ORS (oral rehydration solution). This is a drink that is sold at pharmacies and stores.  Drink clear fluids in small amounts as you are able. These include: ? Water. ? Ice chips. ? Fruit juice that has water added (diluted fruit juice). ? Low-calorie sports drinks.  Eat bland, easy-to-digest foods in small amounts as you are able, such as: ? Bananas. ? Applesauce. ? Rice. ? Low-fat (lean) meats. ? Toast. ? Crackers.  Avoid drinking fluids that have a lot of sugar or caffeine in them. This includes energy drinks, sports drinks, and soda.  Avoid alcohol.  Avoid spicy or fatty foods. General instructions  Take over-the-counter and prescription medicines only as told by your doctor.  Rest at home while you get better.  Drink enough fluid to keep your pee  (urine) pale yellow.  Take slow and deep breaths when you feel sick to your stomach.  Avoid food or things that have strong smells.  Wash your hands often with soap and water. If you cannot use soap and water, use hand sanitizer.  Make sure that all people in your home wash their hands well and often.  Keep all follow-up visits as told by your doctor. This is important. Contact a doctor if:  You feel sicker to your stomach.  You feel sick to your stomach for more than 2 days.  You throw up.  You are not able to drink fluids without throwing up.  You have new symptoms.  You have a fever.  You have a headache.  You have muscle cramps.  You have a rash.  You have pain while peeing.  You feel light-headed or dizzy. Get help right away if:  You have pain in your chest, neck, arm, or jaw.  You feel very weak or you pass out (faint).  You have throw up that is bright red or looks like coffee grounds.  You have bloody or black poop (stools) or poop that looks like tar.  You have a very bad headache, a stiff neck, or both.  You have very bad pain, cramping, or bloating in your belly (abdomen).  You have trouble breathing or you are breathing very quickly.  Your heart  is beating very quickly.  Your skin feels cold and clammy.  You feel confused.  You have signs of losing too much water in your body, such as: ? Dark pee, very little pee, or no pee. ? Cracked lips. ? Dry mouth. ? Sunken eyes. ? Sleepiness. ? Weakness. These symptoms may be an emergency. Do not wait to see if the symptoms will go away. Get medical help right away. Call your local emergency services (911 in the [place] Do not drive yourself to the hospital. Summary  Nausea is feeling sick to your stomach or feeling that you are about to throw up (vomit).  If you throw up, or if you are not able to drink enough fluids, there is a risk that you may lose too much water in your body (get  dehydrated).  Eat and drink what your doctor tells you. Take over-the-counter and prescription medicines only as told by your doctor.  Contact a doctor right away if your symptoms get worse or you have new symptoms.  Keep all follow-up visits as told by your doctor. This is important. This information is not intended to replace advice given to you by your health care provider. Make sure you discuss any questions you have with your health care provider. Document Released: 05/23/2011 Document Revised: 11/11/2017 Document Reviewed: 11/11/2017 Elsevier Interactive Patient Education  2019 [employer].

## 2018-07-08 NOTE — Telephone Encounter (Signed)
See note

## 2018-07-08 NOTE — Progress Notes (Signed)
Krystal Craig is a 85 y.o. female here for a follow up of a pre-existing problem.  I acted as a Education administrator for [employer], PA-C Anselmo Pickler, LPN  History of Present Illness:   Chief Complaint  Patient presents with  . Nausea    HPI  Nausea Pt here today still c/o nausea the past 9 days. Pt was seen by Dr.Wallace on 1/13 and treated for Sinusitis and nausea. Pt was treated with antibiotic and has finished course, but is still nauseated. Pt denies fever, diarrhea, constipation. Pt started with headache about 30 minutes ago and has been having some chills. Pt is following a bland diet. Pt was using Reglan but was not helping. Zofran sent in yesterday and pt said it is not helping either. She states that she is drinking fluids but could drink more. She denies: dizziness, chest pain, SOB, vision changes, falls, increase in confusion, vertigo.   Wt Readings from Last 5 Encounters:  07/08/18 133 lb 4 oz (60.4 kg)  06/29/18 135 lb (61.2 kg)  06/15/18 137 lb (62.1 kg)  05/26/18 137 lb 3.2 oz (62.2 kg)  05/12/18 138 lb 8 oz (62.8 kg)   BP Readings from Last 3 Encounters:  07/08/18 (!) 160/80  06/29/18 140/72  06/15/18 140/74  05/12/18 152/76   Past Medical History:  Diagnosis Date  . Allergic rhinitis   . Chest pain with normal stress test, 2013   . Chronic kidney disease, stage III (moderate) (HCC)   . Complication of anesthesia   . Diaphragmatic hernia 06/26/2007  . Diverticulosis   . Hiatal hernia   . History of bronchitis   . IBS (irritable bowel syndrome)   . Impacted cerumen of both ears   . Insomnia   . Lumbar back pain   . Major depression in partial remission ([place])   . Malignant neoplasm of bronchus and lung, 2006   . Osteoarthritis   . Osteopenia   . PONV (postoperative nausea and vomiting)   . Pre-diabetes   . Psoriasis   . Pure hypercholesterolemia   . Vitamin D deficiency      Social History   Socioeconomic History  . Marital status: Married   Spouse name: Not on file  . Number of children: Not on file  . Years of education: Not on file  . Highest education level: Not on file  Occupational History  . Not on file  Social Needs  . Financial resource strain: Not on file  . Food insecurity:    Worry: Not on file    Inability: Not on file  . Transportation needs:    Medical: Not on file    Non-medical: Not on file  Tobacco Use  . Smoking status: Former Smoker    Packs/day: 1.00    Years: 30.00    Pack years: 30.00    Types: Cigarettes    Last attempt to quit: 06/17/1984    Years since quitting: 34.0  . Smokeless tobacco: Never Used  Substance and Sexual Activity  . Alcohol use: No    Alcohol/week: 0.0 standard drinks  . Drug use: No  . Sexual activity: Not Currently    Birth control/protection: Post-menopausal    Comment: 1st intercourse 86 yo-2 partners  Lifestyle  . Physical activity:    Days per week: Not on file    Minutes per session: Not on file  . Stress: Not on file  Relationships  . Social connections:    Talks on phone: Not on file  Gets together: Not on file    Attends religious service: Not on file    Active member of club or organization: Not on file    Attends meetings of clubs or organizations: Not on file    Relationship status: Not on file  . Intimate partner violence:    Fear of current or ex partner: Not on file    Emotionally abused: Not on file    Physically abused: Not on file    Forced sexual activity: Not on file  Other Topics Concern  . Not on file  Social History Narrative   Married (2nd marriage; 1st husband died at age 3) married 5 29 years in 05/2014. 2 sons from first marriage, 3 from 2nd (step). 15 grandchildren. 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 Hobbies: reading, golf, family time.    Past Surgical History:  Procedure Laterality Date  . ABDOMINAL HYSTERECTOMY  1969  . APPENDECTOMY    . bilateral shouler operations for impingement syndromes Bilateral 2001  .  BUNIONECTOMY Left   . CATARACT EXTRACTION, BILATERAL Bilateral 2012  . CHOLECYSTECTOMY  1976  . LAPAROSCOPIC LYSIS INTESTINAL ADHESIONS    . left lower lobectomy  11/2004   via VATS and mini-thoractomy for 1.5cm adenoca of lung by Dr. Arlyce Dice  . LUMBAR LAMINECTOMY/DECOMPRESSION MICRODISCECTOMY Left 01/05/2015   Procedure: L3-4 DECOMPRESSION MICRODISCECTOMY ON LEFT;  Surgeon: Melina Schools, MD;  Location: [facility];  Service: Orthopedics;  Laterality: Left;  left Lumbar 3-4  . turmor ear Right 09/2013  . urologic sugery with cystocele/rectocele repairs and sling  03/2007   Dr. Terance Hart    Family History  Problem Relation Age of Onset  . Breast cancer Sister 107  . Dementia Mother   . Heart attack Mother   . CVA Father   . Stroke Father   . Colon cancer Neg Hx   . Esophageal cancer Neg Hx   . Pancreatic cancer Neg Hx   . Rectal cancer Neg Hx   . Stomach cancer Neg Hx     Allergies  Allergen Reactions  . Ciprocin-Fluocin-Procin [Fluocinolone] Other (See Comments)    Leg and arm numbness  . Codeine     Chest pain/tightness  . Wellbutrin [Bupropion] Rash    Current Medications:   Current Outpatient Medications:  .  acetaminophen (TYLENOL ARTHRITIS PAIN) 650 MG CR tablet, Take 650 mg by mouth every 8 (eight) hours as needed for pain., Disp: , Rfl:  .  aspirin 81 MG tablet, Take 81 mg by mouth daily., Disp: , Rfl:  .  azelastine (ASTELIN) 0.1 % nasal spray, Place 1 spray into both nostrils 2 (two) times daily. Use in each nostril as directed, Disp: 30 mL, Rfl: 12 .  Calcium Carbonate-Vitamin D (CALCIUM-VITAMIN D) 500-200 MG-UNIT tablet, Take 1 tablet by mouth daily., Disp: , Rfl:  .  clonazePAM (KLONOPIN) 0.5 MG tablet, Take 1 tablet (0.5 mg total) by mouth 3 (three) times daily as needed for anxiety., Disp: 30 tablet, Rfl: 0 .  conjugated estrogens (PREMARIN) vaginal cream, Place 1 Applicatorful vaginally as needed., Disp: , Rfl:  .  Cranberry-Vitamin C-Probiotic (AZO CRANBERRY PO),  Take by mouth as directed., Disp: , Rfl:  .  escitalopram (LEXAPRO) 10 MG tablet, Take 1 tablet (10 mg total) by mouth daily., Disp: 90 tablet, Rfl: 1 .  Multiple Vitamins-Minerals (MULTIVITAMIN WITH MINERALS) tablet, Take 1 tablet by mouth daily., Disp: , Rfl:  .  polyethylene glycol (MIRALAX / GLYCOLAX) packet, Take 17 g by mouth daily., Disp: ,  Rfl:  .  simvastatin (ZOCOR) 40 MG tablet, Take 1 tablet (40 mg total) by mouth at bedtime., Disp: 90 tablet, Rfl: 3 .  traMADol (ULTRAM) 50 MG tablet, Take 1 tablet (50 mg total) by mouth every 6 (six) hours as needed., Disp: 90 tablet, Rfl: 2 .  promethazine (PHENERGAN) 12.5 MG tablet, Take 1 tablet (12.5 mg total) by mouth every 8 (eight) hours as needed for nausea or vomiting., Disp: 20 tablet, Rfl: 0 .  zolpidem (AMBIEN) 10 MG tablet, Take 1 tablet (10 mg total) by mouth at bedtime as needed for sleep., Disp: 30 tablet, Rfl: 2   Review of Systems:   ROS  Negative unless otherwise specified per HPI.   Vitals:   Vitals:   07/08/18 1452  BP: (!) 160/80  Pulse: 77  Temp: 98.5 F (36.9 C)  TempSrc: Oral  SpO2: 95%  Weight: 133 lb 4 oz (60.4 kg)  Height: 5\' 3"  (1.6 m)     Body mass index is 23.6 kg/m.  Physical Exam:   Physical Exam Vitals signs and nursing note reviewed.  Constitutional:      General: She is not in acute distress.    Appearance: She is well-developed. She is not ill-appearing or toxic-appearing.  Cardiovascular:     Rate and Rhythm: Normal rate and regular rhythm.     Pulses: Normal pulses.     Heart sounds: Normal heart sounds, S1 normal and S2 normal.     Comments: No LE edema Pulmonary:     Effort: Pulmonary effort is normal.     Breath sounds: Normal breath sounds.  Abdominal:     General: Abdomen is flat. Bowel sounds are normal.     Palpations: Abdomen is soft.     Tenderness: There is no abdominal tenderness. There is no right CVA tenderness or left CVA tenderness.  Skin:    General: Skin is warm  and dry.  Neurological:     Mental Status: She is alert.     GCS: GCS eye subscore is 4. GCS verbal subscore is 5. GCS motor subscore is 6.  Psychiatric:        Speech: Speech normal.        Behavior: Behavior normal. Behavior is cooperative.      Abd xray with no acute findings  Assessment and Plan:   [place] was seen today for nausea.  Diagnoses and all orders for this visit:  Nausea Unclear etiology of symptoms. Will stop reglan and zofran as that is not working for her. Xray without significant issues -- does have tiny kidney stones in L kidney. UA and labs pending. Will trial phenergan -- she states that she has had this in the past and tolerated well. Discussed sleepy precautions. Follow-up if symptoms persist. Red flags reviewed.  -     CBC with Differential/Platelet -     Comprehensive metabolic panel -     Urinalysis, Routine w reflex microscopic -     DG Abd 2 Views; Future  Other orders -     promethazine (PHENERGAN) 12.5 MG tablet; Take 1 tablet (12.5 mg total) by mouth every 8 (eight) hours as needed for nausea or vomiting.    . Reviewed expectations re: course of current medical issues. . Discussed self-management of symptoms. . Outlined signs and symptoms indicating need for more acute intervention. . Patient verbalized understanding and all questions were answered. . See orders for this visit as documented in the electronic medical record. . Patient received  an Scientific laboratory technician.  CMA or LPN served as scribe during this visit. History, Physical, and Plan performed by medical provider. The above documentation has been reviewed and is accurate and complete.   [name], PA-C

## 2018-07-08 NOTE — Telephone Encounter (Signed)
Pt was seen in office today

## 2018-07-09 LAB — URINALYSIS, ROUTINE W REFLEX MICROSCOPIC
Bilirubin Urine: NEGATIVE
Ketones, ur: NEGATIVE
Leukocytes, UA: NEGATIVE
Nitrite: NEGATIVE
Specific Gravity, Urine: 1.01 (ref 1.000–1.030)
Total Protein, Urine: 100 — AB
Urine Glucose: NEGATIVE
Urobilinogen, UA: 0.2 (ref 0.0–1.0)
pH: 7.5 (ref 5.0–8.0)

## 2018-07-09 LAB — COMPREHENSIVE METABOLIC PANEL
ALT: 27 U/L (ref 0–35)
AST: 22 U/L (ref 0–37)
Albumin: 4.2 g/dL (ref 3.5–5.2)
Alkaline Phosphatase: 63 U/L (ref 39–117)
BUN: 9 mg/dL (ref 6–23)
CO2: 32 mEq/L (ref 19–32)
Calcium: 9.8 mg/dL (ref 8.4–10.5)
Chloride: 96 mEq/L (ref 96–112)
Creatinine, Ser: 0.93 mg/dL (ref 0.40–1.20)
GFR: 57.55 mL/min — ABNORMAL LOW (ref >60.00)
Glucose, Bld: 106 mg/dL — ABNORMAL HIGH (ref 70–99)
Potassium: 4.2 mEq/L (ref 3.5–5.1)
Sodium: 135 mEq/L (ref 135–145)
Total Bilirubin: 1 mg/dL (ref 0.2–1.2)
Total Protein: 6.6 g/dL (ref 6.0–8.3)

## 2018-07-09 LAB — CBC WITH DIFFERENTIAL/PLATELET
Basophils Absolute: 0.1 10*3/uL (ref 0.0–0.1)
Basophils Relative: 1.1 % (ref 0.0–3.0)
Eosinophils Absolute: 0.1 10*3/uL (ref 0.0–0.7)
Eosinophils Relative: 1.1 % (ref 0.0–5.0)
HCT: 37.2 % (ref 36.0–46.0)
Hemoglobin: 12.5 g/dL (ref 12.0–15.0)
Lymphocytes Relative: 15.7 % (ref 12.0–46.0)
Lymphs Abs: 1.1 10*3/uL (ref 0.7–4.0)
MCHC: 33.7 g/dL (ref 30.0–36.0)
MCV: 89 fl (ref 78.0–100.0)
MONOS PCT: 9.7 % (ref 3.0–12.0)
Monocytes Absolute: 0.7 10*3/uL (ref 0.1–1.0)
Neutro Abs: 5 10*3/uL (ref 1.4–7.7)
Neutrophils Relative %: 72.4 % (ref 43.0–77.0)
Platelets: 313 10*3/uL (ref 150.0–400.0)
RBC: 4.18 Mil/uL (ref 3.87–5.11)
RDW: 13.2 % (ref 11.5–15.5)
WBC: 7 10*3/uL (ref 4.0–10.5)

## 2018-07-10 ENCOUNTER — Ambulatory Visit: Payer: Medicare Other | Admitting: Psychology

## 2018-07-13 ENCOUNTER — Ambulatory Visit: Payer: Self-pay | Admitting: *Deleted

## 2018-07-13 NOTE — Telephone Encounter (Signed)
Called patient she has been taking Phenergan every eight she has seen GI 6 months ago for swallowing issues.

## 2018-07-13 NOTE — Telephone Encounter (Signed)
See note

## 2018-07-13 NOTE — Telephone Encounter (Signed)
Pt reports ongoing nausea; seen 06/29/2018 and 07/08/2018.  States "Medication no longer helping";  prescribed phenergan on 1/22. Reports felt "A little better Thursday but nausea came back Friday night along with diarrhea." No vomiting, states 4 episodes of diarrhea last 24 hours. "Mostly watery, light in color." States is staying hydrated, "I'm definitely drinking a lot." Denies dizziness, no abdominal pain. States has been eating bland food.  Pt initially calling to request another medication called in for nausea.  Appt made for tomorrow with Dr. Juleen China. Made aware she would probably need to be seen again before any other med called. Pt verbalizes understanding. Care advise given per protocol.  Reason for Disposition . Nausea lasts > 1 week  Answer Assessment - Initial Assessment Questions 1. NAUSEA SEVERITY: "How bad is the nausea?" (e.g., mild, moderate, severe; dehydration, weight loss)   - MILD: loss of appetite without change in eating habits   - MODERATE: decreased oral intake without significant weight loss, dehydration, or malnutrition   - SEVERE: inadequate caloric or fluid intake, significant weight loss, symptoms of dehydration     Moderate, states is staying hydrated 2. ONSET: "When did the nausea begin?"    Late Friday evening 3. VOMITING: "Any vomiting?" If so, ask: "How many times today?"     No vomiting... diarrhea "Mostly watery."  4. RECURRENT SYMPTOM: "Have you had nausea before?" If so, ask: "When was the last time?" "What happened that time?"    Ongoing since 06/29/2018 5. CAUSE: "What do you think is causing the nausea?"     Unsure  Protocols used: NAUSEA-A-AH

## 2018-07-13 NOTE — Progress Notes (Signed)
Krystal Craig is a 85 y.o. female here for an acute visit.  History of Present Illness:   [name], CMA acting as scribe for Dr. Briscoe Deutscher.   Patient with continued nausea. See previous visit and telephone note prior to encounter. Originally presented with nausea associated with sinus infection, treated and felt some improvement. It then worsened. Staying hydrated. Has been taking antiemetics with no relief. Concern for altered mental status - asking the same question repeatedly. Now, also with diarrhea for the last few days. Denies abdominal pain. Diarrhea is loose to watery, with no mucus or blood. No urinary symptoms. Previous negative GI work-up. 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   HPI:  Review of Systems  Constitutional: Positive for chills, malaise/fatigue and weight loss. Negative for diaphoresis and fever.  HENT: Positive for ear pain. Negative for congestion, ear discharge, hearing loss, nosebleeds, sinus pain, sore throat and tinnitus.        Fullness in left ear.   Eyes: Negative for blurred vision, double vision, photophobia, pain, discharge and redness.  Respiratory: Negative for cough, hemoptysis, sputum production, shortness of breath, wheezing and stridor.   Cardiovascular: Negative for chest pain, palpitations, orthopnea, claudication, leg swelling and PND.  Gastrointestinal: Positive for diarrhea and nausea. Negative for abdominal pain, blood in stool, constipation, heartburn, melena and vomiting.  Genitourinary: Negative for dysuria, flank pain, frequency, hematuria and urgency.  Musculoskeletal: Negative.   Skin: Negative for itching and rash.  Neurological: Positive for headaches. Negative for dizziness, tingling, tremors, sensory change, speech change, focal weakness, seizures and weakness.  Endo/Heme/Allergies: Negative for environmental allergies and polydipsia. Does not bruise/bleed easily.  Psychiatric/Behavioral: Positive for depression. Negative  for hallucinations, memory loss, substance abuse and suicidal ideas. The patient is nervous/anxious. The patient does not have insomnia.    PMHx, SurgHx, SocialHx, Medications, and Allergies were reviewed in the Visit Navigator and updated as appropriate.  Current Medications   NOTE: MED REC NOTE CLEAR TODAY. WILL CALL TOMORROW FOR PATIENT TO TELL us EXACTLY WHICH MEDICATIONS SHE IS TAKING.   Marland Kitchen  acetaminophen (TYLENOL ARTHRITIS PAIN) 650 MG CR tablet, Take 650 mg by mouth every 8 (eight) hours as needed for pain., Disp: , Rfl:  .  aspirin 81 MG tablet, Take 81 mg by mouth daily., Disp: , Rfl:  .  azelastine (ASTELIN) 0.1 % nasal spray, Place 1 spray into both nostrils 2 (two) times daily. Use in each nostril as directed, Disp: 30 mL, Rfl: 12 .  busPIRone (BUSPAR) 7.5 MG tablet, Take 7.5 mg by mouth 2 (two) times daily., Disp: , Rfl:  .  Calcium Carbonate-Vitamin D (CALCIUM-VITAMIN D) 500-200 MG-UNIT tablet, Take 1 tablet by mouth daily., Disp: , Rfl:  .  clonazePAM (KLONOPIN) 0.5 MG tablet, Take 1 tablet (0.5 mg total) by mouth 3 (three) times daily as needed for anxiety., Disp: 30 tablet, Rfl: 0 .  conjugated estrogens (PREMARIN) vaginal cream, Place 1 Applicatorful vaginally as needed., Disp: , Rfl:  .  Cranberry-Vitamin C-Probiotic (AZO CRANBERRY PO), Take by mouth as directed., Disp: , Rfl:  .  Multiple Vitamins-Minerals (MULTIVITAMIN WITH MINERALS) tablet, Take 1 tablet by mouth daily., Disp: , Rfl:  .  simvastatin (ZOCOR) 40 MG tablet, Take 1 tablet (40 mg total) by mouth at bedtime., Disp: 90 tablet, Rfl: 3 .  traMADol (ULTRAM) 50 MG tablet, Take 1 tablet (50 mg total) by mouth every 6 (six) hours as needed., Disp: 90 tablet, Rfl: 2 .  zolpidem (AMBIEN) 10 MG tablet,  Take 0.5 tablets (5 mg total) by mouth at bedtime as needed for sleep., Disp: 30 tablet, Rfl: 2   Allergies  Allergen Reactions  . Ciprocin-Fluocin-Procin [Fluocinolone] Other (See Comments)    Leg and arm numbness  .  Codeine     Chest pain/tightness  . Wellbutrin [Bupropion] Rash   Review of Systems   Pertinent items are noted in the HPI. Otherwise, ROS is negative.  Vitals   Vitals:   07/14/18 1131  BP: 120/76  Pulse: 69  Temp: 98 F (36.7 C)  TempSrc: Oral  SpO2: 97%  Weight: 132 lb 12.8 oz (60.2 kg)  Height: 5\' 3"  (1.6 m)     Body mass index is 23.52 kg/m.  Physical Exam   Physical Exam Vitals signs and nursing note reviewed.  Constitutional:      Appearance: Normal appearance. She is ill-appearing.  HENT:     Head: Normocephalic and atraumatic.     Right Ear: Tympanic membrane normal.     Left Ear: Tympanic membrane normal.     Nose: Nose normal.     Mouth/Throat:     Mouth: Mucous membranes are moist.  Eyes:     Extraocular Movements: Extraocular movements intact.     Conjunctiva/sclera: Conjunctivae normal.     Pupils: Pupils are equal, round, and reactive to light.  Neck:     Musculoskeletal: Normal range of motion and neck supple.  Cardiovascular:     Rate and Rhythm: Normal rate and regular rhythm.     Heart sounds: Normal heart sounds.  Pulmonary:     Effort: Pulmonary effort is normal.     Breath sounds: Normal breath sounds.  Abdominal:     General: There is no distension.     Palpations: Abdomen is soft.     Tenderness: There is no abdominal tenderness. There is no right CVA tenderness or left CVA tenderness.  Skin:    General: Skin is warm.  Neurological:     General: No focal deficit present.     Mental Status: She is alert.  Psychiatric:        Behavior: Behavior normal.        Cognition and Memory: Cognition is impaired. Memory is impaired.      Results for orders placed or performed in visit on 07/14/18  CBC with Differential/Platelet  Result Value Ref Range   WBC 5.7 4.0 - 10.5 K/uL   RBC 4.28 3.87 - 5.11 Mil/uL   Hemoglobin 12.7 12.0 - 15.0 g/dL   HCT 38.2 36.0 - 46.0 %   MCV 89.1 78.0 - 100.0 fl   MCHC 33.4 30.0 - 36.0 g/dL   RDW 13.3  11.5 - 15.5 %   Platelets 295.0 150.0 - 400.0 K/uL   Neutrophils Relative % 66.7 43.0 - 77.0 %   Lymphocytes Relative 19.2 12.0 - 46.0 %   Monocytes Relative 7.4 3.0 - 12.0 %   Eosinophils Relative 5.9 (H) 0.0 - 5.0 %   Basophils Relative 0.8 0.0 - 3.0 %   Neutro Abs 3.8 1.4 - 7.7 K/uL   Lymphs Abs 1.1 0.7 - 4.0 K/uL   Monocytes Absolute 0.4 0.1 - 1.0 K/uL   Eosinophils Absolute 0.3 0.0 - 0.7 K/uL   Basophils Absolute 0.0 0.0 - 0.1 K/uL  Comprehensive metabolic panel  Result Value Ref Range   Sodium 137 135 - 145 mEq/L   Potassium 4.3 3.5 - 5.1 mEq/L   Chloride 104 96 - 112 mEq/L   CO2 27  19 - 32 mEq/L   Glucose, Bld 106 (H) 70 - 99 mg/dL   BUN 7 6 - 23 mg/dL   Creatinine, Ser 0.88 0.40 - 1.20 mg/dL   Total Bilirubin 0.8 0.2 - 1.2 mg/dL   Alkaline Phosphatase 59 39 - 117 U/L   AST 22 0 - 37 U/L   ALT 19 0 - 35 U/L   Total Protein 6.3 6.0 - 8.3 g/dL   Albumin 3.8 3.5 - 5.2 g/dL   Calcium 9.0 8.4 - 10.5 mg/dL   GFR 61.33 >60.00 mL/min  TSH  Result Value Ref Range   TSH 0.94 0.35 - 4.50 uIU/mL  H. pylori antibody, IgG  Result Value Ref Range   H Pylori IgG Negative Negative  Ammonia  Result Value Ref Range   Ammonia 30 11 - 35 umol/L   Ct Head Wo Contrast  Result Date: 07/14/2018 CLINICAL DATA:  85 year old female with altered mental status. Nausea for 2 weeks. Initial encounter. EXAM: CT HEAD WITHOUT CONTRAST TECHNIQUE: Contiguous axial images were obtained from the base of the skull through the vertex without intravenous contrast. COMPARISON:  02/23/2014 MR. 10/01/2014 CT. FINDINGS: Brain: No intracranial hemorrhage or CT evidence of large acute infarct. Chronic microvascular changes. Global atrophy. No intracranial mass lesion noted on this unenhanced exam. Vascular: Vascular calcifications including focal calcification mid basilar artery and cavernous segment internal carotid artery bilaterally. No acute hyperdense vessel. Skull: Negative Sinuses/Orbits: No acute orbital  abnormality. Visualized paranasal sinuses are clear. Other: Mastoid air cells and middle ear cavities are clear. IMPRESSION: 1. No intracranial hemorrhage or CT evidence of large acute infarct. 2. Chronic microvascular changes. 3. Global atrophy. 4. No intracranial mass lesion noted on this unenhanced exam. 5. Vascular calcifications including focal calcification mid basilar artery and cavernous segment internal carotid artery bilaterally. Electronically Signed   By: Genia Del M.D.   On: 07/14/2018 14:39   Dg Abd 2 Views  Result Date: 07/09/2018 CLINICAL DATA:  Nausea for 9 days EXAM: ABDOMEN - 2 VIEW COMPARISON:  Lumbar spine films May 26, 2018 FINDINGS: No free air, portal venous gas, or pneumatosis. Small left-sided renal stones noted. No definite right-sided stones are seen. A calcification in the right pelvis is unchanged since December 2019, probably a phlebolith. The bowel gas pattern is nonobstructive. No other acute abnormalities. IMPRESSION: 1. No bowel obstruction.  No cause for nausea noted. 2. Tiny stones in the left kidney. Electronically Signed   By: Dorise Bullion III M.D   On: 07/09/2018 08:14   Assessment and Plan   Kendi was seen today for ongoing nausea.  Diagnoses and all orders for this visit:  Nausea Comments: Wide differential. Labs reassuring. CT head without acute findings. Discussed holding many meds. Okay Compazine. See AVS. NOTE: Vertigo considered, but constant nature is less consistent, so CT ordered.  Orders: -     CBC with Differential/Platelet -     Comprehensive metabolic panel -     TSH -     H. pylori antibody, IgG -     Cancel: Clostridium Difficile by PCR -     Cancel: Ammonia -     prochlorperazine (COMPAZINE) 5 MG tablet; Take 1 tablet (5 mg total) by mouth every 6 (six) hours as needed for nausea or vomiting. -     Ammonia -     Cancel: Clostridium Difficile by PCR; Future -     CT Head Wo Contrast  Primary insomnia Comments: Okay to  continue Ambien -  taking 1/2 tab nightly prn. Not taking every night.  Altered mental status, unspecified altered mental status type Comments: Stat CT head today with acute onset of confusion, nausea, with basically normal labs.  Orders: -     CBC with Differential/Platelet -     Comprehensive metabolic panel -     TSH -     Ammonia -     CT Head Wo Contrast  Diarrhea, unspecified type Comments: New and with recent antibiotic use. Negative C. diff. Orders: -     CBC with Differential/Platelet -     Comprehensive metabolic panel -     Clostridium Difficile by PCR; Future  CKD (chronic kidney disease), stage III (HCC)  Hx of cancer of lung, s/p left lobectomy 2006  Situational anxiety Comments: Need to clarify medications.   . Reviewed expectations re: course of current medical issues. . Discussed self-management of symptoms. . Outlined signs and symptoms indicating need for more acute intervention. . Patient verbalized understanding and all questions were answered. Marland Kitchen Health Maintenance issues including appropriate healthy diet, exercise, and smoking avoidance were discussed with patient. . See orders for this visit as documented in the electronic medical record. . Patient received an After Visit Summary.  CMA served as Education administrator during this visit. History, Physical, and Plan performed by medical provider. The above documentation has been reviewed and is accurate and complete. Briscoe Deutscher, D.O.  Briscoe Deutscher, DO [place], [place] 07/19/2018   Records requested if needed. Time spent with the patient: 50 minutes, of which >50% was spent in obtaining information about her symptoms, reviewing her previous labs, evaluations, and treatments, counseling her about her condition (please see the discussed topics above), and developing a plan to further investigate it; she had a number of questions which I addressed.

## 2018-07-14 ENCOUNTER — Ambulatory Visit (HOSPITAL_COMMUNITY)
Admission: RE | Admit: 2018-07-14 | Discharge: 2018-07-14 | Disposition: A | Discharge status: Home / Self Care | Payer: Medicare Other | Source: Ambulatory Visit | Attending: Family Medicine | Admitting: Family Medicine

## 2018-07-14 ENCOUNTER — Encounter: Payer: Self-pay | Admitting: Family Medicine

## 2018-07-14 ENCOUNTER — Ambulatory Visit (INDEPENDENT_AMBULATORY_CARE_PROVIDER_SITE_OTHER): Payer: Medicare Other | Admitting: Family Medicine

## 2018-07-14 VITALS — BP 120/76 | HR 69 | Temp 98.0°F | Ht 63.0 in | Wt 132.8 lb

## 2018-07-14 DIAGNOSIS — F5101 Primary insomnia: Secondary | ICD-10-CM | POA: Diagnosis not present

## 2018-07-14 DIAGNOSIS — N183 Chronic kidney disease, stage 3 unspecified: Secondary | ICD-10-CM

## 2018-07-14 DIAGNOSIS — Z85118 Personal history of other malignant neoplasm of bronchus and lung: Secondary | ICD-10-CM

## 2018-07-14 DIAGNOSIS — R197 Diarrhea, unspecified: Secondary | ICD-10-CM | POA: Diagnosis not present

## 2018-07-14 DIAGNOSIS — R110 Nausea: Secondary | ICD-10-CM | POA: Insufficient documentation

## 2018-07-14 DIAGNOSIS — F418 Other specified anxiety disorders: Secondary | ICD-10-CM

## 2018-07-14 DIAGNOSIS — R4182 Altered mental status, unspecified: Secondary | ICD-10-CM | POA: Insufficient documentation

## 2018-07-14 LAB — CBC WITH DIFFERENTIAL/PLATELET
Basophils Absolute: 0 10*3/uL (ref 0.0–0.1)
Basophils Relative: 0.8 % (ref 0.0–3.0)
Eosinophils Absolute: 0.3 10*3/uL (ref 0.0–0.7)
Eosinophils Relative: 5.9 % — ABNORMAL HIGH (ref 0.0–5.0)
HCT: 38.2 % (ref 36.0–46.0)
Hemoglobin: 12.7 g/dL (ref 12.0–15.0)
Lymphocytes Relative: 19.2 % (ref 12.0–46.0)
Lymphs Abs: 1.1 10*3/uL (ref 0.7–4.0)
MCHC: 33.4 g/dL (ref 30.0–36.0)
MCV: 89.1 fl (ref 78.0–100.0)
Monocytes Absolute: 0.4 10*3/uL (ref 0.1–1.0)
Monocytes Relative: 7.4 % (ref 3.0–12.0)
Neutro Abs: 3.8 10*3/uL (ref 1.4–7.7)
Neutrophils Relative %: 66.7 % (ref 43.0–77.0)
Platelets: 295 10*3/uL (ref 150.0–400.0)
RBC: 4.28 Mil/uL (ref 3.87–5.11)
RDW: 13.3 % (ref 11.5–15.5)
WBC: 5.7 10*3/uL (ref 4.0–10.5)

## 2018-07-14 LAB — COMPREHENSIVE METABOLIC PANEL
ALT: 19 U/L (ref 0–35)
AST: 22 U/L (ref 0–37)
Albumin: 3.8 g/dL (ref 3.5–5.2)
Alkaline Phosphatase: 59 U/L (ref 39–117)
BUN: 7 mg/dL (ref 6–23)
CO2: 27 mEq/L (ref 19–32)
Calcium: 9 mg/dL (ref 8.4–10.5)
Chloride: 104 mEq/L (ref 96–112)
Creatinine, Ser: 0.88 mg/dL (ref 0.40–1.20)
GFR: 61.33 mL/min (ref >60.00)
Glucose, Bld: 106 mg/dL — ABNORMAL HIGH (ref 70–99)
Potassium: 4.3 mEq/L (ref 3.5–5.1)
Sodium: 137 mEq/L (ref 135–145)
Total Bilirubin: 0.8 mg/dL (ref 0.2–1.2)
Total Protein: 6.3 g/dL (ref 6.0–8.3)

## 2018-07-14 LAB — TSH: TSH: 0.94 u[IU]/mL (ref 0.35–4.50)

## 2018-07-14 LAB — AMMONIA: Ammonia: 30 umol/L (ref 11–35)

## 2018-07-14 LAB — H. PYLORI ANTIBODY, IGG: H Pylori IgG: NEGATIVE

## 2018-07-14 MED ORDER — PROCHLORPERAZINE MALEATE 5 MG PO TABS: 5.0000 mg | ORAL_TABLET | Freq: Four times a day (QID) | ORAL | 0 refills | Status: DC | PRN

## 2018-07-14 NOTE — Patient Instructions (Addendum)
Let's try to limit medications right now and see what is going on.   DO NOT TAKE: PHENERGAN [place] MULTIVITAMIN AMBIEN  I will get more labs today. You MAY take the COMPAZINE as needed for nausea. I want you to let your bowels rest as much as possible. Hydrate well. Soft foods, no acidic or spicy foods.

## 2018-07-15 ENCOUNTER — Other Ambulatory Visit: Payer: Medicare Other

## 2018-07-15 ENCOUNTER — Encounter: Payer: Self-pay | Admitting: Family Medicine

## 2018-07-15 DIAGNOSIS — R110 Nausea: Secondary | ICD-10-CM

## 2018-07-16 ENCOUNTER — Telehealth: Payer: Self-pay | Admitting: Family Medicine

## 2018-07-16 NOTE — Telephone Encounter (Signed)
See note

## 2018-07-16 NOTE — Telephone Encounter (Signed)
Noted  

## 2018-07-16 NOTE — Telephone Encounter (Unsigned)
Copied from [place] [id number]. Topic: Quick Communication - Home Health Verbal Orders >> Jul 16, 2018 10:53 AM Yvette Rack wrote: Caller/Agency: Ligi with [place] Number: [phone] Requesting OT/PT/Skilled Nursing/Social Work: PT   Ligi with [facility] stated pt has been sick and has not had PT visit in two weeks so pt would need a reassessment.  Cb# [phone]

## 2018-07-17 ENCOUNTER — Other Ambulatory Visit: Payer: Self-pay

## 2018-07-17 DIAGNOSIS — R110 Nausea: Secondary | ICD-10-CM

## 2018-07-17 DIAGNOSIS — R197 Diarrhea, unspecified: Secondary | ICD-10-CM

## 2018-07-17 NOTE — Telephone Encounter (Signed)
See note  Copied from [place] [id number]. Topic: General - Other >> Jul 17, 2018  8:16 AM Lennox Solders wrote: Reason for CRM:pt saw dr Juleen China on 07-14-2018 and is requesting dr wallace or nurse to call her back today concerning nausea  medication prochlorper. Pt did take with nurse on tuesday

## 2018-07-17 NOTE — Telephone Encounter (Signed)
Called patient she has restarted the Buspar and klonopin. She has done that and had some improvement in symptoms. She will not take and of the new medications that were called in for her nausea. I have stared a new urgent referral for GI. Patient will call if she does not hear from them by Tuesday.

## 2018-07-17 NOTE — Telephone Encounter (Signed)
Called patient on Wednesday and reviewed all meds with son. At that time she was going to restart the

## 2018-07-17 NOTE — Telephone Encounter (Signed)
Okay new order if needed. Please check on her today to see if her nausea has improved. If not, let's get her to GI ASAP.

## 2018-07-18 LAB — CLOSTRIDIUM DIFFICILE BY PCR: Toxigenic C. Difficile by PCR: NEGATIVE

## 2018-07-21 ENCOUNTER — Other Ambulatory Visit: Payer: Self-pay

## 2018-07-21 ENCOUNTER — Ambulatory Visit: Payer: Self-pay | Admitting: *Deleted

## 2018-07-21 MED ORDER — MECLIZINE HCL 25 MG PO TABS: 25.0000 mg | ORAL_TABLET | Freq: Three times a day (TID) | ORAL | 0 refills | Status: DC | PRN

## 2018-07-21 NOTE — Telephone Encounter (Signed)
Please see message and advise 

## 2018-07-21 NOTE — Telephone Encounter (Signed)
Called patient gave information will call if any questions she has app with GI on Friday.

## 2018-07-21 NOTE — Telephone Encounter (Signed)
Patient called to request a call back from nurse with questions on medication given by Dr Juleen China for nausea. She want clarification on whether she should stop one to start the other. Ph# [phone]  Patient is taking Klonopin and Buspar now and she is not better. Patient wants to know if it is ok to start the Compazine for her nausea. Please let her know if she is to stop the first 2 or she is to continue all 3.  Reason for Disposition . Caller has NON-URGENT medication question about med that PCP prescribed and triager unable to answer question  Protocols used: MEDICATION QUESTION CALL-A-AH

## 2018-07-21 NOTE — Telephone Encounter (Signed)
Okay to hold Buspar as well.

## 2018-07-21 NOTE — Telephone Encounter (Signed)
Stop all. Let's call in Meclizine 25 mg po q 8 hours prn nausea. Urgent to GI.

## 2018-07-21 NOTE — Telephone Encounter (Signed)
See note

## 2018-07-21 NOTE — Telephone Encounter (Signed)
She needs to continue Buspar right

## 2018-07-24 ENCOUNTER — Ambulatory Visit: Payer: Medicare Other | Admitting: Gastroenterology

## 2018-07-24 ENCOUNTER — Encounter: Payer: Self-pay | Admitting: Gastroenterology

## 2018-07-24 ENCOUNTER — Other Ambulatory Visit: Payer: Self-pay | Admitting: Family Medicine

## 2018-07-24 ENCOUNTER — Ambulatory Visit: Payer: Medicare Other | Admitting: Psychology

## 2018-07-24 VITALS — BP 140/80 | HR 68 | Ht 63.5 in | Wt 130.0 lb

## 2018-07-24 DIAGNOSIS — R110 Nausea: Secondary | ICD-10-CM

## 2018-07-24 DIAGNOSIS — R634 Abnormal weight loss: Secondary | ICD-10-CM | POA: Diagnosis not present

## 2018-07-24 DIAGNOSIS — E785 Hyperlipidemia, unspecified: Secondary | ICD-10-CM

## 2018-07-24 MED ORDER — PANTOPRAZOLE SODIUM 40 MG PO TBEC: 40.0000 mg | DELAYED_RELEASE_TABLET | Freq: Every day | ORAL | 2 refills | Status: DC

## 2018-07-24 MED ORDER — SIMVASTATIN 40 MG PO TABS: 40.0000 mg | ORAL_TABLET | Freq: Every day | ORAL | 0 refills | Status: DC

## 2018-07-24 MED ORDER — ONDANSETRON HCL 8 MG PO TABS: 8.0000 mg | ORAL_TABLET | Freq: Three times a day (TID) | ORAL | 0 refills | Status: DC | PRN

## 2018-07-24 NOTE — Patient Instructions (Signed)
We have sent the following medications to your pharmacy for you to pick up at your convenience: pantoprazole and zofran.   Thank you for choosing me and [facility].  Pricilla Riffle. Dagoberto Ligas., MD., [facility] Regal

## 2018-07-24 NOTE — Telephone Encounter (Signed)
Copied from [place] [id number]. Topic: Quick Communication - Rx Refill/Question >> Jul 24, 2018 12:33 PM Blase Mess A wrote: Medication: simvastatin (ZOCOR) 40 MG tablet [[id number]]   Has the patient contacted their pharmacy? Yes  (Agent: If no, request that the patient contact the pharmacy for the refill.) (Agent: If yes, when and what did the pharmacy advise?)  Preferred Pharmacy (with phone number or street name): [place], [place] [phone] (Phone) [phone] (Fax)    Agent: Please be advised that RX refills may take up to 3 business days. We ask that you follow-up with your pharmacy.

## 2018-07-24 NOTE — Progress Notes (Signed)
    History of Present Illness: This is an 85 year old female with altered mental status, nausea, confusion starting 3 weeks ago.  She has had 1 or 2 episodes of diarrhea however this has not persisted.  Her nausea has been a persistent symptom.  Her energy level is reduced.  Evaluation by Dr. Juleen China was reviewed and it has been unrevealing.  Patient relates no help in improving nausea with metoclopramide, Compazine or meclizine.  She has been able to eat however she states she is eating less. Eating does not improve or worsen her nausea.  She has lost about 4 pounds.  CBC, CMP, TSH, C diff, H pylori, ammonia were all unremarkable.  Head CT unremarkable. 2 view abdominal films negative. Nausea is worse with sitting, standing and is improved with recumbency.   Barium esophagram 12/2017 IMPRESSION: Mild esophageal dysmotility.  Otherwise unremarkable esophagram.  Current Medications, Allergies, Past Medical History, Past Surgical History, Family History and Social History were reviewed in [employer] record.  Physical Exam: General: Well developed, well nourished, no acute distress Head: Normocephalic and atraumatic Eyes:  sclerae anicteric, EOMI Ears: Normal auditory acuity Mouth: No deformity or lesions Lungs: Clear throughout to auscultation Heart: Regular rate and rhythm; no murmurs, rubs or bruits Abdomen: Soft, non tender and non distended. No masses, hepatosplenomegaly or hernias noted. Normal Bowel sounds Rectal: Not done Musculoskeletal: Symmetrical with no gross deformities  Pulses:  Normal pulses noted Extremities: No clubbing, cyanosis, edema or deformities noted Neurological: Alert oriented x 4, grossly nonfocal Psychological:  Alert and cooperative. Depressed affect   Assessment and Recommendations:  1. Nausea and mild weight loss. No clear GI cause. R/O anxiety, depression causing nausea. Unlikely gastritis or ulcer however it is possible. Pantoprazole  40 mg po qd for 1 month and Zofran 8 mg po tid prn. Return to Dr. Juleen China for further mgmt. If nausea, weight loss persist without relief by other measures or without a clear etiology uncovered consider further evaluation with abdominal/pelvic CT and EGD.

## 2018-07-24 NOTE — Telephone Encounter (Signed)
Requested Prescriptions  Pending Prescriptions Disp Refills  . simvastatin (ZOCOR) 40 MG tablet 90 tablet 0    Sig: Take 1 tablet (40 mg total) by mouth at bedtime.     Cardiovascular:  Antilipid - Statins Failed - 07/24/2018 12:54 PM      Failed - LDL in normal range and within 360 days    LDL Cholesterol  Date Value Ref Range Status  01/26/2016 75 mg/dL Final         Failed - Triglycerides in normal range and within 360 days    Triglycerides  Date Value Ref Range Status  11/26/2017 238.0 (H) 0.0 - 149.0 mg/dL Final    Comment:    Normal:  <150 mg/dLBorderline High:  150 - 199 mg/dL   Triglyceride fasting, serum  Date Value Ref Range Status  04/24/2006 191 (H) 0 - 149 mg/dL Final    Comment:    See lab report for associated comment(s)         Passed - Total Cholesterol in normal range and within 360 days    Cholesterol  Date Value Ref Range Status  11/26/2017 195 0 - 200 mg/dL Final    Comment:    ATP III Classification       Desirable:  < 200 mg/dL               Borderline High:  200 - 239 mg/dL          High:  > = 240 mg/dL         Passed - HDL in normal range and within 360 days    HDL  Date Value Ref Range Status  11/26/2017 57.90 >39.00 mg/dL Final         Passed - Patient is not pregnant      Passed - Valid encounter within last 12 months    Recent Outpatient Visits          1 week ago Nausea   [place] Wallace, [place], DO   2 weeks ago Nausea   [place], [place], [place]   3 weeks ago Nausea   [place] Wallace, [place], DO   1 month ago Sore throat   [place], [place], [place]   1 month ago Right hip pain   [place] Wallace, Danae Chen, DO      Future Appointments            In 4 days Briscoe Deutscher, [place], [place]   In 2 months  [place], [place]

## 2018-07-28 ENCOUNTER — Ambulatory Visit (INDEPENDENT_AMBULATORY_CARE_PROVIDER_SITE_OTHER): Payer: Medicare Other | Admitting: Family Medicine

## 2018-07-28 ENCOUNTER — Encounter: Payer: Self-pay | Admitting: Family Medicine

## 2018-07-28 VITALS — BP 140/84 | HR 78 | Temp 98.3°F | Ht 63.5 in | Wt 130.0 lb

## 2018-07-28 DIAGNOSIS — K582 Mixed irritable bowel syndrome: Secondary | ICD-10-CM | POA: Diagnosis not present

## 2018-07-28 DIAGNOSIS — G894 Chronic pain syndrome: Secondary | ICD-10-CM | POA: Diagnosis not present

## 2018-07-28 DIAGNOSIS — R110 Nausea: Secondary | ICD-10-CM

## 2018-07-28 DIAGNOSIS — F418 Other specified anxiety disorders: Secondary | ICD-10-CM | POA: Diagnosis not present

## 2018-07-28 NOTE — Patient Instructions (Signed)
Please take medications as prescribed on Medication List.

## 2018-07-28 NOTE — Progress Notes (Signed)
Krystal Craig is a 85 y.o. female is here for follow up.  History of Present Illness:   I acted as a Education administrator for [employer], DO [employer], LPN  HPI:   Pt here to follow up on nausea and is feeling better. Saw GI Dr. Fuller Plan on 2/7.   Pt also wants to discuss medications. Pt restarted her Clonazepam 0.5 mg yesterday, using BID and says she is feeling better. Pt had diarrhea on Friday and Sunday but has subsided and stools are normal now.  Patient brings in medications today to review and update.   There are no preventive care reminders to display for this patient. Depression screen [facility] 2/9 06/29/2018 04/27/2018 12/22/2017  Decreased Interest 0 0 1  Down, Depressed, Hopeless 0 0 1  PHQ - 2 Score 0 0 2  Altered sleeping 0 0 0  Tired, decreased energy 0 0 0  Change in appetite 0 0 0  Feeling bad or failure about yourself  0 0 0  Trouble concentrating 0 0 0  Moving slowly or fidgety/restless 0 0 0  Suicidal thoughts 0 0 0  PHQ-9 Score 0 0 2  Difficult doing work/chores Not difficult at all Somewhat difficult Somewhat difficult   PMHx, SurgHx, SocialHx, FamHx, Medications, and Allergies were reviewed in the Visit Navigator and updated as appropriate.   Patient Active Problem List   Diagnosis Date Noted  . PND (post-nasal drip) 07/01/2018  . History of cholecystectomy 05/31/2018  . Age-related osteoporosis without current pathological fracture, does not want treatment 05/31/2018  . Caregiver burden 05/31/2018  . Insulin resistance 05/31/2018  . Chronic pain syndrome 05/31/2018  . Abnormal findings on diagnostic imaging of other abdominal regions, liver and pancreas 05/31/2018  . Aortic atherosclerosis ([place]) 05/26/2018  . Degenerative arthritis of lumbar spine 05/26/2018  . Situational depression 12/22/2017  . Diverticulosis 11/11/2016  . Hx of cancer of lung, s/p left lobectomy 2006 11/11/2016  . Situational anxiety 11/06/2016  . Primary osteoarthritis of both first  carpometacarpal joints 12/28/2015  . Microscopic hematuria, with previous evaluation by Urology and determined to be benign 09/12/2014  . Psoriasis 02/28/2014  . CKD (chronic kidney disease), stage III ([place]) 02/28/2014  . Thoracic lymphadenopathy 09/25/2011  . Hyperlipidemia 09/25/2011  . Insomnia, takes 5 mg Ambien prn 08/03/2008  . Allergic rhinitis 06/26/2007  . GERD 06/26/2007  . Irritable bowel syndrome 06/26/2007   Social History   Tobacco Use  . Smoking status: Former Smoker    Packs/day: 1.00    Years: 30.00    Pack years: 30.00    Types: Cigarettes    Last attempt to quit: 06/17/1984    Years since quitting: 34.1  . Smokeless tobacco: Never Used  Substance Use Topics  . Alcohol use: No    Alcohol/week: 0.0 standard drinks  . Drug use: No   Current Medications and Allergies   .  acetaminophen (TYLENOL ARTHRITIS PAIN) 650 MG CR tablet, Take 650 mg by mouth every 8 (eight) hours as needed for pain., Disp: , Rfl:  .  aspirin 81 MG tablet, Take 81 mg by mouth daily., Disp: , Rfl:  .  azelastine (ASTELIN) 0.1 % nasal spray, Place 1 spray into both nostrils 2 (two) times daily. Use in each nostril as directed (Patient taking differently: Place 1 spray into both nostrils 2 (two) times daily as needed. Use in each nostril as directed), Disp: 30 mL, Rfl: 12 .  Calcium Carbonate-Vitamin D (CALCIUM-VITAMIN D) 500-200 MG-UNIT tablet, Take  1 tablet by mouth daily., Disp: , Rfl:  .  clonazePAM (KLONOPIN) 0.5 MG tablet, Take 0.5 mg by mouth 2 (two) times daily as needed for anxiety., Disp: , Rfl:  .  conjugated estrogens (PREMARIN) vaginal cream, Place 1 Applicatorful vaginally as needed., Disp: , Rfl:  .  Cranberry-Vitamin C-Probiotic (AZO CRANBERRY PO), Take by mouth as directed., Disp: , Rfl:  .  Multiple Vitamins-Minerals (MULTIVITAMIN WITH MINERALS) tablet, Take 1 tablet by mouth daily., Disp: , Rfl:  .  ondansetron (ZOFRAN) 8 MG tablet, Take 1 tablet (8 mg total) by mouth every 8  (eight) hours as needed for nausea or vomiting., Disp: 60 tablet, Rfl: 0 .  pantoprazole (PROTONIX) 40 MG tablet, Take 1 tablet (40 mg total) by mouth daily., Disp: 30 tablet, Rfl: 2 .  simvastatin (ZOCOR) 40 MG tablet, Take 1 tablet (40 mg total) by mouth at bedtime., Disp: 90 tablet, Rfl: 0 .  traMADol (ULTRAM) 50 MG tablet, Take 1 tablet (50 mg total) by mouth every 6 (six) hours as needed., Disp: 90 tablet, Rfl: 2 .  zolpidem (AMBIEN) 10 MG tablet, Take 0.5 tablets (5 mg total) by mouth at bedtime as needed for sleep., Disp: 30 tablet, Rfl: 2 .  escitalopram (LEXAPRO) 10 MG tablet, Take 10 mg by mouth daily., Disp: , Rfl:    Allergies  Allergen Reactions  . Ciprocin-Fluocin-Procin [Fluocinolone] Other (See Comments)    Leg and arm numbness  . Codeine     Chest pain/tightness  . Wellbutrin [Bupropion] Rash   Review of Systems   Pertinent items are noted in the HPI. Otherwise, a complete ROS is negative.  Vitals   Vitals:   07/28/18 0959  BP: 140/84  Pulse: 78  Temp: 98.3 F (36.8 C)  TempSrc: Oral  SpO2: 98%  Weight: 130 lb (59 kg)  Height: 5' 3.5" (1.613 m)     Body mass index is 22.67 kg/m.  Physical Exam   Physical Exam Vitals signs and nursing note reviewed.  HENT:     Head: Normocephalic and atraumatic.  Eyes:     Pupils: Pupils are equal, round, and reactive to light.  Neck:     Musculoskeletal: Normal range of motion and neck supple.  Cardiovascular:     Rate and Rhythm: Normal rate and regular rhythm.     Heart sounds: Normal heart sounds.  Pulmonary:     Effort: Pulmonary effort is normal.  Abdominal:     Palpations: Abdomen is soft.  Skin:    General: Skin is warm.  Psychiatric:        Behavior: Behavior normal.    Assessment and Plan   Krystal Craig was seen today for nausea and discuss medications..  Patient's medications together clarified which medications she was actually taking.  He separated medications that she was advised to continue from  the that she was advised to stop.  The patient admits that many of her issues began after she abruptly stopped Lexapro and Klonopin.  Was done without letting us know.  She has the medication and will restart today.  I did let her know that she may have more nausea over the next week since medications are again changing.  If her symptoms worsen or do not improve or resolve completely after the next 1 to 2 weeks, I would like to obtain a CT of her abdomen and pelvis.  Also considering having her see neurology because I am concerned that her memory may not be as accurate as it  was a few months ago.  We will follow closely recheck in the next 1 to 2 weeks.  Diagnoses and all orders for this visit:  Nausea -     Urine Culture  Chronic pain syndrome Situational anxiety Irritable bowel syndrome with both constipation and diarrhea  . Orders and follow up as documented in [place], reviewed diet, exercise and weight control, cardiovascular risk and specific lipid/LDL goals reviewed, reviewed medications and side effects in detail.  . Reviewed expectations re: course of current medical issues. . Outlined signs and symptoms indicating need for more acute intervention. . Patient verbalized understanding and all questions were answered. . Patient received an After Visit Summary.  CMA served as Education administrator during this visit. History, Physical, and Plan performed by medical provider. The above documentation has been reviewed and is accurate and complete. Briscoe Deutscher, D.O.  Briscoe Deutscher, DO [place], [place] [facility] 07/29/2018

## 2018-07-29 ENCOUNTER — Other Ambulatory Visit: Payer: Self-pay | Admitting: *Deleted

## 2018-07-29 ENCOUNTER — Encounter: Payer: Self-pay | Admitting: Family Medicine

## 2018-07-29 LAB — URINE CULTURE
MICRO NUMBER:: 179950
SPECIMEN QUALITY:: ADEQUATE

## 2018-07-30 ENCOUNTER — Telehealth: Payer: Self-pay | Admitting: Family Medicine

## 2018-07-30 NOTE — Telephone Encounter (Signed)
Okay for orders? 

## 2018-07-30 NOTE — Telephone Encounter (Signed)
pls see message and advise 

## 2018-07-30 NOTE — Telephone Encounter (Unsigned)
Copied from [place] [id number]. Topic: Quick Communication - Home Health Verbal Orders >> Jul 30, 2018  2:08 PM Carolyn Stare wrote: Caller/Agency   [place] with [facility] Number  [phone]  Requesting OT/PT/Skilled Nursing/Social Work   req verbal orders to  recertify pt for home health PT For appts that she missed and the date will be 08/03/2018   will call back with frequency after 08/03/2018  Frequency

## 2018-07-30 NOTE — Telephone Encounter (Signed)
See note

## 2018-07-31 NOTE — Telephone Encounter (Signed)
Called and gave orders

## 2018-08-02 ENCOUNTER — Encounter: Payer: Self-pay | Admitting: Family Medicine

## 2018-08-03 ENCOUNTER — Telehealth: Payer: Self-pay | Admitting: Family Medicine

## 2018-08-03 ENCOUNTER — Encounter: Payer: Self-pay | Admitting: Family Medicine

## 2018-08-03 DIAGNOSIS — G894 Chronic pain syndrome: Secondary | ICD-10-CM | POA: Diagnosis not present

## 2018-08-03 DIAGNOSIS — Z636 Dependent relative needing care at home: Secondary | ICD-10-CM | POA: Diagnosis not present

## 2018-08-03 DIAGNOSIS — Z85118 Personal history of other malignant neoplasm of bronchus and lung: Secondary | ICD-10-CM | POA: Diagnosis not present

## 2018-08-03 DIAGNOSIS — M5116 Intervertebral disc disorders with radiculopathy, lumbar region: Secondary | ICD-10-CM | POA: Diagnosis not present

## 2018-08-03 DIAGNOSIS — Z7982 Long term (current) use of aspirin: Secondary | ICD-10-CM | POA: Diagnosis not present

## 2018-08-03 DIAGNOSIS — M810 Age-related osteoporosis without current pathological fracture: Secondary | ICD-10-CM | POA: Diagnosis not present

## 2018-08-03 DIAGNOSIS — M4726 Other spondylosis with radiculopathy, lumbar region: Secondary | ICD-10-CM | POA: Diagnosis not present

## 2018-08-03 DIAGNOSIS — N183 Chronic kidney disease, stage 3 (moderate): Secondary | ICD-10-CM | POA: Diagnosis not present

## 2018-08-03 DIAGNOSIS — M150 Primary generalized (osteo)arthritis: Secondary | ICD-10-CM | POA: Diagnosis not present

## 2018-08-03 DIAGNOSIS — I700 Atherosclerosis of aorta: Secondary | ICD-10-CM | POA: Diagnosis not present

## 2018-08-03 NOTE — Telephone Encounter (Signed)
See note

## 2018-08-03 NOTE — Telephone Encounter (Signed)
Copied from [place] [id number]. Topic: Quick Communication - Home Health Verbal Orders >> Aug 03, 2018 11:56 AM Alfredia Ferguson R wrote: Caller/Agency: [place] Number: [phone] Requesting OT/PT/Skilled Nursing/Social Work: PT Frequency: 1 week 1, 2 week 3, 1 week 3

## 2018-08-04 NOTE — Telephone Encounter (Signed)
Called and left verbal on v/m  

## 2018-08-06 DIAGNOSIS — Z85118 Personal history of other malignant neoplasm of bronchus and lung: Secondary | ICD-10-CM | POA: Diagnosis not present

## 2018-08-06 DIAGNOSIS — M150 Primary generalized (osteo)arthritis: Secondary | ICD-10-CM | POA: Diagnosis not present

## 2018-08-06 DIAGNOSIS — M5116 Intervertebral disc disorders with radiculopathy, lumbar region: Secondary | ICD-10-CM | POA: Diagnosis not present

## 2018-08-06 DIAGNOSIS — M810 Age-related osteoporosis without current pathological fracture: Secondary | ICD-10-CM | POA: Diagnosis not present

## 2018-08-06 DIAGNOSIS — I700 Atherosclerosis of aorta: Secondary | ICD-10-CM | POA: Diagnosis not present

## 2018-08-06 DIAGNOSIS — Z636 Dependent relative needing care at home: Secondary | ICD-10-CM | POA: Diagnosis not present

## 2018-08-06 DIAGNOSIS — M4726 Other spondylosis with radiculopathy, lumbar region: Secondary | ICD-10-CM | POA: Diagnosis not present

## 2018-08-06 DIAGNOSIS — N183 Chronic kidney disease, stage 3 (moderate): Secondary | ICD-10-CM | POA: Diagnosis not present

## 2018-08-06 DIAGNOSIS — G894 Chronic pain syndrome: Secondary | ICD-10-CM | POA: Diagnosis not present

## 2018-08-06 DIAGNOSIS — Z7982 Long term (current) use of aspirin: Secondary | ICD-10-CM | POA: Diagnosis not present

## 2018-08-07 ENCOUNTER — Ambulatory Visit: Payer: Medicare Other | Admitting: Psychology

## 2018-08-11 ENCOUNTER — Other Ambulatory Visit: Payer: Self-pay

## 2018-08-11 NOTE — Patient Outreach (Signed)
[facility]) Care Management  08/11/2018  Krystal Craig [birth year] [id number]  TELEPHONE SCREENING Referral date: 07/29/18 Referral source: primary MD referral Referral reason: medication, social work Insurance: [employer] Attempt #1  Telephone call to patient regarding primary MD  referral. Unable to reach patient. HIPAA compliant voice message left with call back phone number.   PLAN: RNCM will attempt 2nd telephone call to patient within 4 business days. RNCM will send outreach letter.   Quinn Plowman RN,BSN, [place] Telephonic  [phone]

## 2018-08-12 ENCOUNTER — Other Ambulatory Visit: Payer: Self-pay | Admitting: Family Medicine

## 2018-08-12 DIAGNOSIS — Z636 Dependent relative needing care at home: Secondary | ICD-10-CM | POA: Diagnosis not present

## 2018-08-12 DIAGNOSIS — Z7982 Long term (current) use of aspirin: Secondary | ICD-10-CM | POA: Diagnosis not present

## 2018-08-12 DIAGNOSIS — Z85118 Personal history of other malignant neoplasm of bronchus and lung: Secondary | ICD-10-CM | POA: Diagnosis not present

## 2018-08-12 DIAGNOSIS — M5116 Intervertebral disc disorders with radiculopathy, lumbar region: Secondary | ICD-10-CM | POA: Diagnosis not present

## 2018-08-12 DIAGNOSIS — F5101 Primary insomnia: Secondary | ICD-10-CM

## 2018-08-12 DIAGNOSIS — G894 Chronic pain syndrome: Secondary | ICD-10-CM | POA: Diagnosis not present

## 2018-08-12 DIAGNOSIS — M150 Primary generalized (osteo)arthritis: Secondary | ICD-10-CM | POA: Diagnosis not present

## 2018-08-12 DIAGNOSIS — M810 Age-related osteoporosis without current pathological fracture: Secondary | ICD-10-CM | POA: Diagnosis not present

## 2018-08-12 DIAGNOSIS — M4726 Other spondylosis with radiculopathy, lumbar region: Secondary | ICD-10-CM | POA: Diagnosis not present

## 2018-08-12 DIAGNOSIS — I700 Atherosclerosis of aorta: Secondary | ICD-10-CM | POA: Diagnosis not present

## 2018-08-12 DIAGNOSIS — N183 Chronic kidney disease, stage 3 (moderate): Secondary | ICD-10-CM | POA: Diagnosis not present

## 2018-08-12 NOTE — Telephone Encounter (Signed)
Ok to fill 

## 2018-08-13 DIAGNOSIS — Z7982 Long term (current) use of aspirin: Secondary | ICD-10-CM | POA: Diagnosis not present

## 2018-08-13 DIAGNOSIS — M4726 Other spondylosis with radiculopathy, lumbar region: Secondary | ICD-10-CM | POA: Diagnosis not present

## 2018-08-13 DIAGNOSIS — M5116 Intervertebral disc disorders with radiculopathy, lumbar region: Secondary | ICD-10-CM | POA: Diagnosis not present

## 2018-08-13 DIAGNOSIS — N183 Chronic kidney disease, stage 3 (moderate): Secondary | ICD-10-CM | POA: Diagnosis not present

## 2018-08-13 DIAGNOSIS — G894 Chronic pain syndrome: Secondary | ICD-10-CM | POA: Diagnosis not present

## 2018-08-13 DIAGNOSIS — Z636 Dependent relative needing care at home: Secondary | ICD-10-CM | POA: Diagnosis not present

## 2018-08-13 DIAGNOSIS — M150 Primary generalized (osteo)arthritis: Secondary | ICD-10-CM | POA: Diagnosis not present

## 2018-08-13 DIAGNOSIS — M810 Age-related osteoporosis without current pathological fracture: Secondary | ICD-10-CM | POA: Diagnosis not present

## 2018-08-13 DIAGNOSIS — I700 Atherosclerosis of aorta: Secondary | ICD-10-CM | POA: Diagnosis not present

## 2018-08-13 DIAGNOSIS — Z85118 Personal history of other malignant neoplasm of bronchus and lung: Secondary | ICD-10-CM | POA: Diagnosis not present

## 2018-08-14 ENCOUNTER — Ambulatory Visit: Payer: Self-pay

## 2018-08-14 ENCOUNTER — Other Ambulatory Visit: Payer: Self-pay

## 2018-08-14 NOTE — Patient Outreach (Signed)
[place] [facility]) Care Management  08/14/2018  CICLALY MULCAHEY 07-Aug-1932 [id number]   TELEPHONE SCREENING Referral date: 07/29/18 Referral source: primary MD referral Referral reason: medication, social work Insurance: [employer]  Outreach: No answer.  HIPAA compliant voice message left.  Plan: RN CM will attempt patient again within 4 business days.    Jone Baseman, RN, MSN [place] Management Care Management Coordinator Direct Line [phone] Cell [phone] Toll Free: [phone]  Fax: [phone]

## 2018-08-18 ENCOUNTER — Other Ambulatory Visit: Payer: Self-pay

## 2018-08-18 DIAGNOSIS — M4726 Other spondylosis with radiculopathy, lumbar region: Secondary | ICD-10-CM | POA: Diagnosis not present

## 2018-08-18 DIAGNOSIS — Z85118 Personal history of other malignant neoplasm of bronchus and lung: Secondary | ICD-10-CM | POA: Diagnosis not present

## 2018-08-18 DIAGNOSIS — Z7982 Long term (current) use of aspirin: Secondary | ICD-10-CM | POA: Diagnosis not present

## 2018-08-18 DIAGNOSIS — G894 Chronic pain syndrome: Secondary | ICD-10-CM | POA: Diagnosis not present

## 2018-08-18 DIAGNOSIS — M5116 Intervertebral disc disorders with radiculopathy, lumbar region: Secondary | ICD-10-CM | POA: Diagnosis not present

## 2018-08-18 DIAGNOSIS — M150 Primary generalized (osteo)arthritis: Secondary | ICD-10-CM | POA: Diagnosis not present

## 2018-08-18 DIAGNOSIS — M810 Age-related osteoporosis without current pathological fracture: Secondary | ICD-10-CM | POA: Diagnosis not present

## 2018-08-18 DIAGNOSIS — Z636 Dependent relative needing care at home: Secondary | ICD-10-CM | POA: Diagnosis not present

## 2018-08-18 DIAGNOSIS — N183 Chronic kidney disease, stage 3 (moderate): Secondary | ICD-10-CM | POA: Diagnosis not present

## 2018-08-18 DIAGNOSIS — I700 Atherosclerosis of aorta: Secondary | ICD-10-CM | POA: Diagnosis not present

## 2018-08-18 NOTE — Patient Outreach (Signed)
[place] [facility]) Care Management  08/18/2018  Krystal Craig [birth year] [id number]   TELEPHONE SCREENING Referral date:07/29/18 Referral source:primary MD referral Referral reason:medication, social work Insurance:[employer]    Outreach attempt #3 to patient. No answer at present. RN CM left HIPAA compliant voicemail along with contact info.      Plan: Primary assigned RN CM will close case if no response from letter mailed to patient.    Enzo Montgomery, RN,BSN,CCM [place] Management Telephonic Care Management Coordinator Direct Phone: [phone] Toll Free: [phone] Fax: [phone]

## 2018-08-19 ENCOUNTER — Ambulatory Visit: Payer: Self-pay

## 2018-08-20 DIAGNOSIS — M150 Primary generalized (osteo)arthritis: Secondary | ICD-10-CM | POA: Diagnosis not present

## 2018-08-20 DIAGNOSIS — N183 Chronic kidney disease, stage 3 (moderate): Secondary | ICD-10-CM | POA: Diagnosis not present

## 2018-08-20 DIAGNOSIS — M4726 Other spondylosis with radiculopathy, lumbar region: Secondary | ICD-10-CM | POA: Diagnosis not present

## 2018-08-20 DIAGNOSIS — G894 Chronic pain syndrome: Secondary | ICD-10-CM | POA: Diagnosis not present

## 2018-08-20 DIAGNOSIS — Z636 Dependent relative needing care at home: Secondary | ICD-10-CM | POA: Diagnosis not present

## 2018-08-20 DIAGNOSIS — M5116 Intervertebral disc disorders with radiculopathy, lumbar region: Secondary | ICD-10-CM | POA: Diagnosis not present

## 2018-08-20 DIAGNOSIS — Z7982 Long term (current) use of aspirin: Secondary | ICD-10-CM | POA: Diagnosis not present

## 2018-08-20 DIAGNOSIS — Z85118 Personal history of other malignant neoplasm of bronchus and lung: Secondary | ICD-10-CM | POA: Diagnosis not present

## 2018-08-20 DIAGNOSIS — I700 Atherosclerosis of aorta: Secondary | ICD-10-CM | POA: Diagnosis not present

## 2018-08-20 DIAGNOSIS — M810 Age-related osteoporosis without current pathological fracture: Secondary | ICD-10-CM | POA: Diagnosis not present

## 2018-08-25 DIAGNOSIS — Z7982 Long term (current) use of aspirin: Secondary | ICD-10-CM | POA: Diagnosis not present

## 2018-08-25 DIAGNOSIS — G894 Chronic pain syndrome: Secondary | ICD-10-CM | POA: Diagnosis not present

## 2018-08-25 DIAGNOSIS — Z85118 Personal history of other malignant neoplasm of bronchus and lung: Secondary | ICD-10-CM | POA: Diagnosis not present

## 2018-08-25 DIAGNOSIS — N183 Chronic kidney disease, stage 3 (moderate): Secondary | ICD-10-CM | POA: Diagnosis not present

## 2018-08-25 DIAGNOSIS — M5116 Intervertebral disc disorders with radiculopathy, lumbar region: Secondary | ICD-10-CM | POA: Diagnosis not present

## 2018-08-25 DIAGNOSIS — M810 Age-related osteoporosis without current pathological fracture: Secondary | ICD-10-CM | POA: Diagnosis not present

## 2018-08-25 DIAGNOSIS — I700 Atherosclerosis of aorta: Secondary | ICD-10-CM | POA: Diagnosis not present

## 2018-08-25 DIAGNOSIS — Z636 Dependent relative needing care at home: Secondary | ICD-10-CM | POA: Diagnosis not present

## 2018-08-25 DIAGNOSIS — M4726 Other spondylosis with radiculopathy, lumbar region: Secondary | ICD-10-CM | POA: Diagnosis not present

## 2018-08-25 DIAGNOSIS — M150 Primary generalized (osteo)arthritis: Secondary | ICD-10-CM | POA: Diagnosis not present

## 2018-08-26 ENCOUNTER — Other Ambulatory Visit: Payer: Self-pay | Admitting: Family Medicine

## 2018-08-26 ENCOUNTER — Other Ambulatory Visit: Payer: Self-pay

## 2018-08-26 NOTE — Patient Outreach (Signed)
[place] [facility]) Care Management  08/26/2018  Krystal Craig 1932-12-26 [id number]   Multiple attempts to establish contact with patient without success. No response from letter mailed to patient.   Plan: RN CM will close case at this time.   Jone Baseman, RN, MSN [place] Management Care Management Coordinator Direct Line [phone] Cell [phone] Toll Free: [phone]  Fax: [phone]

## 2018-08-26 NOTE — Telephone Encounter (Signed)
Ok to fill 

## 2018-08-28 DIAGNOSIS — Z636 Dependent relative needing care at home: Secondary | ICD-10-CM | POA: Diagnosis not present

## 2018-08-28 DIAGNOSIS — M150 Primary generalized (osteo)arthritis: Secondary | ICD-10-CM | POA: Diagnosis not present

## 2018-08-28 DIAGNOSIS — M4726 Other spondylosis with radiculopathy, lumbar region: Secondary | ICD-10-CM | POA: Diagnosis not present

## 2018-08-28 DIAGNOSIS — Z85118 Personal history of other malignant neoplasm of bronchus and lung: Secondary | ICD-10-CM | POA: Diagnosis not present

## 2018-08-28 DIAGNOSIS — Z7982 Long term (current) use of aspirin: Secondary | ICD-10-CM | POA: Diagnosis not present

## 2018-08-28 DIAGNOSIS — N183 Chronic kidney disease, stage 3 (moderate): Secondary | ICD-10-CM | POA: Diagnosis not present

## 2018-08-28 DIAGNOSIS — I700 Atherosclerosis of aorta: Secondary | ICD-10-CM | POA: Diagnosis not present

## 2018-08-28 DIAGNOSIS — M5116 Intervertebral disc disorders with radiculopathy, lumbar region: Secondary | ICD-10-CM | POA: Diagnosis not present

## 2018-08-28 DIAGNOSIS — G894 Chronic pain syndrome: Secondary | ICD-10-CM | POA: Diagnosis not present

## 2018-08-28 DIAGNOSIS — M810 Age-related osteoporosis without current pathological fracture: Secondary | ICD-10-CM | POA: Diagnosis not present

## 2018-09-01 ENCOUNTER — Other Ambulatory Visit: Payer: Self-pay | Admitting: Family Medicine

## 2018-09-01 DIAGNOSIS — M159 Polyosteoarthritis, unspecified: Secondary | ICD-10-CM

## 2018-09-02 DIAGNOSIS — Z7982 Long term (current) use of aspirin: Secondary | ICD-10-CM | POA: Diagnosis not present

## 2018-09-02 DIAGNOSIS — M4726 Other spondylosis with radiculopathy, lumbar region: Secondary | ICD-10-CM | POA: Diagnosis not present

## 2018-09-02 DIAGNOSIS — G894 Chronic pain syndrome: Secondary | ICD-10-CM | POA: Diagnosis not present

## 2018-09-02 DIAGNOSIS — Z85118 Personal history of other malignant neoplasm of bronchus and lung: Secondary | ICD-10-CM | POA: Diagnosis not present

## 2018-09-02 DIAGNOSIS — N183 Chronic kidney disease, stage 3 (moderate): Secondary | ICD-10-CM | POA: Diagnosis not present

## 2018-09-02 DIAGNOSIS — M810 Age-related osteoporosis without current pathological fracture: Secondary | ICD-10-CM | POA: Diagnosis not present

## 2018-09-02 DIAGNOSIS — M5116 Intervertebral disc disorders with radiculopathy, lumbar region: Secondary | ICD-10-CM | POA: Diagnosis not present

## 2018-09-02 DIAGNOSIS — M150 Primary generalized (osteo)arthritis: Secondary | ICD-10-CM | POA: Diagnosis not present

## 2018-09-02 DIAGNOSIS — I700 Atherosclerosis of aorta: Secondary | ICD-10-CM | POA: Diagnosis not present

## 2018-09-02 DIAGNOSIS — Z636 Dependent relative needing care at home: Secondary | ICD-10-CM | POA: Diagnosis not present

## 2018-09-04 DIAGNOSIS — M810 Age-related osteoporosis without current pathological fracture: Secondary | ICD-10-CM | POA: Diagnosis not present

## 2018-09-04 DIAGNOSIS — G894 Chronic pain syndrome: Secondary | ICD-10-CM | POA: Diagnosis not present

## 2018-09-04 DIAGNOSIS — I700 Atherosclerosis of aorta: Secondary | ICD-10-CM

## 2018-09-04 DIAGNOSIS — Z636 Dependent relative needing care at home: Secondary | ICD-10-CM

## 2018-09-04 DIAGNOSIS — Z85118 Personal history of other malignant neoplasm of bronchus and lung: Secondary | ICD-10-CM

## 2018-09-04 DIAGNOSIS — M150 Primary generalized (osteo)arthritis: Secondary | ICD-10-CM

## 2018-09-04 DIAGNOSIS — F329 Major depressive disorder, single episode, unspecified: Secondary | ICD-10-CM

## 2018-09-04 DIAGNOSIS — Z7982 Long term (current) use of aspirin: Secondary | ICD-10-CM

## 2018-09-04 DIAGNOSIS — N183 Chronic kidney disease, stage 3 (moderate): Secondary | ICD-10-CM

## 2018-09-04 DIAGNOSIS — M5116 Intervertebral disc disorders with radiculopathy, lumbar region: Secondary | ICD-10-CM | POA: Diagnosis not present

## 2018-09-04 DIAGNOSIS — F418 Other specified anxiety disorders: Secondary | ICD-10-CM

## 2018-09-04 DIAGNOSIS — M4726 Other spondylosis with radiculopathy, lumbar region: Secondary | ICD-10-CM | POA: Diagnosis not present

## 2018-09-08 DIAGNOSIS — Z85118 Personal history of other malignant neoplasm of bronchus and lung: Secondary | ICD-10-CM | POA: Diagnosis not present

## 2018-09-08 DIAGNOSIS — G894 Chronic pain syndrome: Secondary | ICD-10-CM | POA: Diagnosis not present

## 2018-09-08 DIAGNOSIS — M810 Age-related osteoporosis without current pathological fracture: Secondary | ICD-10-CM | POA: Diagnosis not present

## 2018-09-08 DIAGNOSIS — Z636 Dependent relative needing care at home: Secondary | ICD-10-CM | POA: Diagnosis not present

## 2018-09-08 DIAGNOSIS — Z7982 Long term (current) use of aspirin: Secondary | ICD-10-CM | POA: Diagnosis not present

## 2018-09-08 DIAGNOSIS — M4726 Other spondylosis with radiculopathy, lumbar region: Secondary | ICD-10-CM | POA: Diagnosis not present

## 2018-09-08 DIAGNOSIS — M5116 Intervertebral disc disorders with radiculopathy, lumbar region: Secondary | ICD-10-CM | POA: Diagnosis not present

## 2018-09-08 DIAGNOSIS — I700 Atherosclerosis of aorta: Secondary | ICD-10-CM | POA: Diagnosis not present

## 2018-09-08 DIAGNOSIS — N183 Chronic kidney disease, stage 3 (moderate): Secondary | ICD-10-CM | POA: Diagnosis not present

## 2018-09-08 DIAGNOSIS — M150 Primary generalized (osteo)arthritis: Secondary | ICD-10-CM | POA: Diagnosis not present

## 2018-09-14 ENCOUNTER — Telehealth (INDEPENDENT_AMBULATORY_CARE_PROVIDER_SITE_OTHER): Payer: Self-pay | Admitting: Physical Medicine and Rehabilitation

## 2018-09-15 ENCOUNTER — Encounter: Payer: Self-pay | Admitting: Family Medicine

## 2018-09-15 NOTE — Telephone Encounter (Signed)
Is auth needed for [phone]? Scheduled for 10/22/18.

## 2018-09-15 NOTE — Telephone Encounter (Signed)
ok 

## 2018-09-21 NOTE — Telephone Encounter (Signed)
No pa is needed for [id number], pt is r/s to 11/03/2018.

## 2018-09-28 ENCOUNTER — Ambulatory Visit: Payer: Medicare Other | Admitting: Family Medicine

## 2018-10-06 ENCOUNTER — Other Ambulatory Visit: Payer: Self-pay | Admitting: Family Medicine

## 2018-10-06 DIAGNOSIS — F5101 Primary insomnia: Secondary | ICD-10-CM

## 2018-10-06 DIAGNOSIS — M159 Polyosteoarthritis, unspecified: Secondary | ICD-10-CM

## 2018-10-06 NOTE — Telephone Encounter (Signed)
Ok to refill 

## 2018-10-12 ENCOUNTER — Ambulatory Visit: Payer: Medicare Other

## 2018-10-22 ENCOUNTER — Encounter (INDEPENDENT_AMBULATORY_CARE_PROVIDER_SITE_OTHER): Payer: Medicare Other | Admitting: Physical Medicine and Rehabilitation

## 2018-10-26 ENCOUNTER — Other Ambulatory Visit: Payer: Self-pay

## 2018-10-26 ENCOUNTER — Ambulatory Visit (INDEPENDENT_AMBULATORY_CARE_PROVIDER_SITE_OTHER): Payer: Medicare Other | Admitting: Women's Health

## 2018-10-26 ENCOUNTER — Encounter: Payer: Self-pay | Admitting: Women's Health

## 2018-10-26 DIAGNOSIS — N3000 Acute cystitis without hematuria: Secondary | ICD-10-CM | POA: Diagnosis not present

## 2018-10-26 MED ORDER — SULFAMETHOXAZOLE-TRIMETHOPRIM 800-160 MG PO TABS: 1.0000 | ORAL_TABLET | Freq: Two times a day (BID) | ORAL | 0 refills | Status: DC

## 2018-10-26 MED ORDER — METOCLOPRAMIDE HCL 10 MG PO TABS: 10.0000 mg | ORAL_TABLET | Freq: Four times a day (QID) | ORAL | 0 refills | Status: DC

## 2018-10-26 NOTE — Progress Notes (Signed)
85 year old MWF G2, P2  for tele-visit per request.  Patient preferred tele-visit due to COVID-19, has stayed at home.  Identified herself with birthdate and consent given.  Patient is at her home.  I am in the office.  Reports increased urinary frequency, urgency, burning/pain with urination especially at end of stream for the past 2 to 3 days.  Has been taking over-the-counter Azo with some relief.  Denies visible blood in urine, change in back pain (has chronic back pain), vaginal discharge, confusion or fever.  Has had UTIs in the past.  Not sexually active, husband's health.  Hysterectomy.  History of lung cancer, hypercholesteremia, anxiety/depression, GERD and IBS.  Allergy to Cipro.  Unable to perform an exam or UA-tele-visit  Probable UTI  Plan: Options reviewed, Septra twice daily for 3 days.  Reglan 10 mg before meals and at bedtime, has used in the past with antibiotics with good relief.  UTI prevention discussed.  Instructed to call if continued symptoms or no relief.  Reviewed possible need for an office visit with UA may be needed.  Agreeable with plan.  Length of phone visit 9 minutes.

## 2018-10-29 ENCOUNTER — Other Ambulatory Visit: Payer: Self-pay | Admitting: Family Medicine

## 2018-10-29 DIAGNOSIS — E785 Hyperlipidemia, unspecified: Secondary | ICD-10-CM

## 2018-10-30 ENCOUNTER — Other Ambulatory Visit: Payer: Self-pay | Admitting: Family Medicine

## 2018-10-30 NOTE — Telephone Encounter (Signed)
Last OV 07/28/18 Last refill 07/24/18 #90/0 Next OV 12/02/18

## 2018-10-30 NOTE — Telephone Encounter (Signed)
Last OV 07/28/2018 Last refill 10/07/2018 #60/0 Next OV 12/02/2018

## 2018-11-03 ENCOUNTER — Ambulatory Visit: Payer: Medicare Other | Admitting: Physical Medicine and Rehabilitation

## 2018-11-03 ENCOUNTER — Other Ambulatory Visit: Payer: Self-pay | Admitting: Family Medicine

## 2018-11-03 ENCOUNTER — Encounter: Payer: Self-pay | Admitting: Physical Medicine and Rehabilitation

## 2018-11-03 ENCOUNTER — Other Ambulatory Visit: Payer: Self-pay

## 2018-11-03 ENCOUNTER — Ambulatory Visit: Payer: Medicare Other

## 2018-11-03 VITALS — BP 144/66 | HR 75

## 2018-11-03 DIAGNOSIS — M5416 Radiculopathy, lumbar region: Secondary | ICD-10-CM | POA: Diagnosis not present

## 2018-11-03 DIAGNOSIS — M159 Polyosteoarthritis, unspecified: Secondary | ICD-10-CM

## 2018-11-03 DIAGNOSIS — F5101 Primary insomnia: Secondary | ICD-10-CM

## 2018-11-03 MED ORDER — TRAMADOL HCL 50 MG PO TABS: 50.0000 mg | ORAL_TABLET | Freq: Four times a day (QID) | ORAL | 0 refills | Status: DC | PRN

## 2018-11-03 MED ORDER — METHYLPREDNISOLONE ACETATE 80 MG/ML IJ SUSP
80.0000 mg | Freq: Once | INTRAMUSCULAR | Status: AC
  Administered 2018-11-03: 80 mg

## 2018-11-03 MED ORDER — ZOLPIDEM TARTRATE 10 MG PO TABS: 10.0000 mg | ORAL_TABLET | Freq: Every evening | ORAL | 0 refills | Status: DC | PRN

## 2018-11-03 NOTE — Telephone Encounter (Signed)
See request °

## 2018-11-03 NOTE — Telephone Encounter (Signed)
   We have that we sent in script for clonazepam on 10/31/18 called patient she does not have. Called pharmacy they have on hold will fill for her now with ones that will be called in.   Last app: 07/28/2018 Follow up: 12/02/2018

## 2018-11-03 NOTE — Telephone Encounter (Signed)
Copied from [place] [id number]. Topic: Quick Communication - Rx Refill/Question >> Nov 03, 2018  9:17 AM Rainey Pines A wrote: Medication: zolpidem (AMBIEN) 10 MG tablet ,traMADol (ULTRAM) 50 MG tablet ,clonazePAM (KLONOPIN) 0.5 MG tablet  Has the patient contacted their pharmacy? Yes (Agent: If no, request that the patient contact the pharmacy for the refill.) (Agent: If yes, when and what did the pharmacy advise?)Contact PCP  Preferred Pharmacy (with phone number or street name): [place], [place] [phone] (Phone) [phone] (Fax)    Agent: Please be advised that RX refills may take up to 3 business days. We ask that you follow-up with your pharmacy.

## 2018-11-03 NOTE — Progress Notes (Signed)
 .  Numeric Pain Rating Scale and Functional Assessment Average Pain 7   In the last MONTH (on 0-10 scale) has pain interfered with the following?  1. General activity like being  able to carry out your everyday physical activities such as walking, climbing stairs, carrying groceries, or moving a chair?  Rating(5)   +Driver, -BT, -Dye Allergies.  

## 2018-11-04 NOTE — Progress Notes (Signed)
Krystal Craig - 85 y.o. female MRN [id number]  Date of birth: September 10, 1932  Office Visit Note: Visit Date: 11/03/2018 PCP: Briscoe Deutscher, DO Referred by: Briscoe Deutscher, DO  Subjective: Chief Complaint  Patient presents with  . Lower Back - Pain  . Right Leg - Pain   HPI:  Krystal Craig is a 85 y.o. female who comes in today For planned repeat right L5-S1 interlaminar for steroid injection.  Prior injection in October gave her quite a bit of relief and she is very happy.  She has had no trauma or falls since that time she has had no focal weakness or bowel or bladder changes or any other red flag complaints.  No unexplained weight loss.  She is having right hip and leg pain that started several weeks ago and has not really been changing with activity modification exercise and medication.  We will go ahead and repeat the injection today.  Depending on outcome would look at updating MRI of the lumbar spine.  She has had prior laminectomy by Dr. Melina Schools.  ROS Otherwise per HPI.  Assessment & Plan: Visit Diagnoses:  1. Lumbar radiculopathy     Plan: No additional findings.   Meds & Orders:  Meds ordered this encounter  Medications  . methylPREDNISolone acetate (DEPO-MEDROL) injection 80 mg    Orders Placed This Encounter  Procedures  . XR C-ARM NO REPORT  . Epidural Steroid injection    Follow-up: Return if symptoms worsen or fail to improve.   Procedures: No procedures performed  Lumbar Epidural Steroid Injection - Interlaminar Approach with Fluoroscopic Guidance  Patient: Krystal Craig      Date of Birth: 1933-02-15 MRN: [id number] PCP: Briscoe Deutscher, DO      Visit Date: 11/03/2018   Universal Protocol:     Consent Given By: the patient  Position: PRONE  Additional Comments: Vital signs were monitored before and after the procedure. Patient was prepped and draped in the usual sterile fashion. The correct patient, procedure, and site was verified.   Injection Procedure Details:  Procedure Site One Meds Administered:  Meds ordered this encounter  Medications  . methylPREDNISolone acetate (DEPO-MEDROL) injection 80 mg     Laterality: Right  Location/Site:  L5-S1  Needle size: 20 G  Needle type: Tuohy  Needle Placement: Paramedian epidural  Findings:   -Comments: Excellent flow of contrast into the epidural space.  Procedure Details: Using a paramedian approach from the side mentioned above, the region overlying the inferior lamina was localized under fluoroscopic visualization and the soft tissues overlying this structure were infiltrated with 4 ml. of 1% Lidocaine without Epinephrine. The Tuohy needle was inserted into the epidural space using a paramedian approach.   The epidural space was localized using loss of resistance along with lateral and bi-planar fluoroscopic views.  After negative aspirate for air, blood, and CSF, a 2 ml. volume of Isovue-250 was injected into the epidural space and the flow of contrast was observed. Radiographs were obtained for documentation purposes.    The injectate was administered into the level noted above.   Additional Comments:  The patient tolerated the procedure well Dressing: 2 x 2 sterile gauze and Band-Aid    Post-procedure details: Patient was observed during the procedure. Post-procedure instructions were reviewed.  Patient left the clinic in stable condition.   Clinical History: 01/05/2015 Left L3-4 DECOMPRESSION MICRODISCECTOMY ON LEFT with Melina Schools, MD     Objective:  VS:  HT:  WT:   BMI:     BP:(!) 144/66  HR:75bpm  TEMP: ( )  RESP:  Physical Exam  Ortho Exam Imaging: Xr C-arm No Report  Result Date: 11/03/2018 Please see Notes tab for imaging impression.

## 2018-11-04 NOTE — Procedures (Signed)
Lumbar Epidural Steroid Injection - Interlaminar Approach with Fluoroscopic Guidance  Patient: Krystal Craig      Date of Birth: 1932-12-18 MRN: [id number] PCP: Briscoe Deutscher, DO      Visit Date: 11/03/2018   Universal Protocol:     Consent Given By: the patient  Position: PRONE  Additional Comments: Vital signs were monitored before and after the procedure. Patient was prepped and draped in the usual sterile fashion. The correct patient, procedure, and site was verified.   Injection Procedure Details:  Procedure Site One Meds Administered:  Meds ordered this encounter  Medications  . methylPREDNISolone acetate (DEPO-MEDROL) injection 80 mg     Laterality: Right  Location/Site:  L5-S1  Needle size: 20 G  Needle type: Tuohy  Needle Placement: Paramedian epidural  Findings:   -Comments: Excellent flow of contrast into the epidural space.  Procedure Details: Using a paramedian approach from the side mentioned above, the region overlying the inferior lamina was localized under fluoroscopic visualization and the soft tissues overlying this structure were infiltrated with 4 ml. of 1% Lidocaine without Epinephrine. The Tuohy needle was inserted into the epidural space using a paramedian approach.   The epidural space was localized using loss of resistance along with lateral and bi-planar fluoroscopic views.  After negative aspirate for air, blood, and CSF, a 2 ml. volume of Isovue-250 was injected into the epidural space and the flow of contrast was observed. Radiographs were obtained for documentation purposes.    The injectate was administered into the level noted above.   Additional Comments:  The patient tolerated the procedure well Dressing: 2 x 2 sterile gauze and Band-Aid    Post-procedure details: Patient was observed during the procedure. Post-procedure instructions were reviewed.  Patient left the clinic in stable condition.

## 2018-12-02 ENCOUNTER — Encounter: Payer: Self-pay | Admitting: Family Medicine

## 2018-12-02 ENCOUNTER — Other Ambulatory Visit: Payer: Self-pay

## 2018-12-02 ENCOUNTER — Ambulatory Visit (INDEPENDENT_AMBULATORY_CARE_PROVIDER_SITE_OTHER): Payer: Medicare Other | Admitting: Family Medicine

## 2018-12-02 ENCOUNTER — Ambulatory Visit (INDEPENDENT_AMBULATORY_CARE_PROVIDER_SITE_OTHER): Payer: Medicare Other

## 2018-12-02 VITALS — BP 124/78 | HR 65 | Temp 99.0°F | Ht 63.5 in | Wt 138.4 lb

## 2018-12-02 DIAGNOSIS — F5101 Primary insomnia: Secondary | ICD-10-CM

## 2018-12-02 DIAGNOSIS — M159 Polyosteoarthritis, unspecified: Secondary | ICD-10-CM | POA: Diagnosis not present

## 2018-12-02 DIAGNOSIS — Z636 Dependent relative needing care at home: Secondary | ICD-10-CM

## 2018-12-02 DIAGNOSIS — Z0000 Encounter for general adult medical examination without abnormal findings: Secondary | ICD-10-CM

## 2018-12-02 DIAGNOSIS — M810 Age-related osteoporosis without current pathological fracture: Secondary | ICD-10-CM | POA: Diagnosis not present

## 2018-12-02 DIAGNOSIS — Z85118 Personal history of other malignant neoplasm of bronchus and lung: Secondary | ICD-10-CM

## 2018-12-02 DIAGNOSIS — G894 Chronic pain syndrome: Secondary | ICD-10-CM

## 2018-12-02 DIAGNOSIS — L409 Psoriasis, unspecified: Secondary | ICD-10-CM

## 2018-12-02 DIAGNOSIS — E785 Hyperlipidemia, unspecified: Secondary | ICD-10-CM

## 2018-12-02 DIAGNOSIS — J984 Other disorders of lung: Secondary | ICD-10-CM | POA: Diagnosis not present

## 2018-12-02 LAB — COMPREHENSIVE METABOLIC PANEL
ALT: 32 U/L (ref 0–35)
AST: 30 U/L (ref 0–37)
Albumin: 4.1 g/dL (ref 3.5–5.2)
Alkaline Phosphatase: 61 U/L (ref 39–117)
BUN: 13 mg/dL (ref 6–23)
CO2: 30 mEq/L (ref 19–32)
Calcium: 9.3 mg/dL (ref 8.4–10.5)
Chloride: 99 mEq/L (ref 96–112)
Creatinine, Ser: 0.94 mg/dL (ref 0.40–1.20)
GFR: 56.78 mL/min — ABNORMAL LOW (ref >60.00)
Glucose, Bld: 95 mg/dL (ref 70–99)
Potassium: 4.6 mEq/L (ref 3.5–5.1)
Sodium: 136 mEq/L (ref 135–145)
Total Bilirubin: 0.9 mg/dL (ref 0.2–1.2)
Total Protein: 6.2 g/dL (ref 6.0–8.3)

## 2018-12-02 LAB — CBC WITH DIFFERENTIAL/PLATELET
Basophils Absolute: 0 10*3/uL (ref 0.0–0.1)
Basophils Relative: 0.5 % (ref 0.0–3.0)
Eosinophils Absolute: 0.1 10*3/uL (ref 0.0–0.7)
Eosinophils Relative: 2.9 % (ref 0.0–5.0)
HCT: 37.9 % (ref 36.0–46.0)
Hemoglobin: 12.5 g/dL (ref 12.0–15.0)
Lymphocytes Relative: 16.8 % (ref 12.0–46.0)
Lymphs Abs: 0.8 10*3/uL (ref 0.7–4.0)
MCHC: 33.1 g/dL (ref 30.0–36.0)
MCV: 89.8 fl (ref 78.0–100.0)
Monocytes Absolute: 0.4 10*3/uL (ref 0.1–1.0)
Monocytes Relative: 8.5 % (ref 3.0–12.0)
Neutro Abs: 3.6 10*3/uL (ref 1.4–7.7)
Neutrophils Relative %: 71.3 % (ref 43.0–77.0)
Platelets: 253 10*3/uL (ref 150.0–400.0)
RBC: 4.22 Mil/uL (ref 3.87–5.11)
RDW: 13.8 % (ref 11.5–15.5)
WBC: 5 10*3/uL (ref 4.0–10.5)

## 2018-12-02 LAB — LIPID PANEL
Cholesterol: 183 mg/dL (ref 0–200)
HDL: 63.2 mg/dL (ref >39.00)
NonHDL: 119.55
Total CHOL/HDL Ratio: 3
Triglycerides: 208 mg/dL — ABNORMAL HIGH (ref 0.0–149.0)
VLDL: 41.6 mg/dL — ABNORMAL HIGH (ref 0.0–40.0)

## 2018-12-02 LAB — LDL CHOLESTEROL, DIRECT: Direct LDL: 92 mg/dL

## 2018-12-02 MED ORDER — ESCITALOPRAM OXALATE 10 MG PO TABS: 10.0000 mg | ORAL_TABLET | Freq: Every day | ORAL | 1 refills | Status: DC

## 2018-12-02 MED ORDER — CLONAZEPAM 0.5 MG PO TABS: 0.5000 mg | ORAL_TABLET | Freq: Two times a day (BID) | ORAL | 0 refills | Status: DC | PRN

## 2018-12-02 MED ORDER — SIMVASTATIN 40 MG PO TABS: 40.0000 mg | ORAL_TABLET | Freq: Every day | ORAL | 0 refills | Status: DC

## 2018-12-02 MED ORDER — ZOLPIDEM TARTRATE 10 MG PO TABS: 10.0000 mg | ORAL_TABLET | Freq: Every evening | ORAL | 0 refills | Status: DC | PRN

## 2018-12-02 MED ORDER — TRAMADOL HCL 50 MG PO TABS: 50.0000 mg | ORAL_TABLET | Freq: Four times a day (QID) | ORAL | 0 refills | Status: DC | PRN

## 2018-12-02 NOTE — Progress Notes (Signed)
Subjective:    Krystal Craig is a 85 y.o. female who presents for Medicare Annual (Subsequent) preventive examination.  Review of Systems  Constitutional: Negative for chills, fever, malaise/fatigue and weight loss.  Respiratory: Negative for cough, shortness of breath and wheezing.   Cardiovascular: Negative for chest pain, palpitations and leg swelling.  Gastrointestinal: Negative for abdominal pain, constipation, diarrhea, nausea and vomiting.  Genitourinary: Negative for dysuria and urgency.  Musculoskeletal: Negative for joint pain and myalgias.  Skin: Negative for rash.  Neurological: Negative for dizziness and headaches.  Psychiatric/Behavioral: Negative for depression, substance abuse and suicidal ideas. The patient is nervous/anxious.    Objective:   Vitals: BP 124/78 (BP Location: Left Arm, Patient Position: Sitting, Cuff Size: Normal)   Pulse 65   Temp 99 F (37.2 C) (Oral)   Ht 5' 3.5" (1.613 m)   Wt 138 lb 6.1 oz (62.8 kg)   LMP  (LMP Unknown)   SpO2 97%   BMI 24.13 kg/m   Body mass index is 24.13 kg/m.  Physical Exam Vitals signs and nursing note reviewed.  HENT:     Head: Normocephalic and atraumatic.  Eyes:     Pupils: Pupils are equal, round, and reactive to light.  Neck:     Musculoskeletal: Normal range of motion and neck supple.  Cardiovascular:     Rate and Rhythm: Normal rate and regular rhythm.     Heart sounds: Normal heart sounds.  Pulmonary:     Effort: Pulmonary effort is normal.  Abdominal:     Palpations: Abdomen is soft.  Skin:    General: Skin is warm.  Psychiatric:        Behavior: Behavior normal.    Social History   Tobacco Use  Smoking Status Former Smoker  . Packs/day: 1.00  . Years: 30.00  . Pack years: 30.00  . Types: Cigarettes  . Quit date: 06/17/1984  . Years since quitting: 34.4  Smokeless Tobacco Never Used     Patient Active Problem List   Diagnosis Date Noted  . PND (post-nasal drip) 07/01/2018  .  History of cholecystectomy 05/31/2018  . Age-related osteoporosis without current pathological fracture, does not want treatment 05/31/2018  . Caregiver burden 05/31/2018  . Insulin resistance 05/31/2018  . Chronic pain syndrome 05/31/2018  . Abnormal findings on diagnostic imaging of other abdominal regions, liver and pancreas 05/31/2018  . Aortic atherosclerosis ([place]) 05/26/2018  . Degenerative arthritis of lumbar spine 05/26/2018  . Situational depression 12/22/2017  . Diverticulosis 11/11/2016  . Hx of cancer of lung, s/p left lobectomy 2006 11/11/2016  . Situational anxiety 11/06/2016  . Primary osteoarthritis of both first carpometacarpal joints 12/28/2015  . Microscopic hematuria, with previous evaluation by Urology and determined to be benign 09/12/2014  . Psoriasis, followed by [facility] 02/28/2014  . CKD (chronic kidney disease), stage III ([place]) 02/28/2014  . Thoracic lymphadenopathy 09/25/2011  . Hyperlipidemia, Rx Zocor 09/25/2011  . Insomnia, takes 5 mg Ambien prn 08/03/2008  . Allergic rhinitis 06/26/2007  . GERD 06/26/2007  . Irritable bowel syndrome 06/26/2007   Past Surgical History:  Procedure Laterality Date  . ABDOMINAL HYSTERECTOMY  1969  . APPENDECTOMY    . bilateral shouler operations for impingement syndromes Bilateral 2001  . BUNIONECTOMY Left   . CATARACT EXTRACTION, BILATERAL Bilateral 2012  . CHOLECYSTECTOMY  1976  . LAPAROSCOPIC LYSIS INTESTINAL ADHESIONS    . left lower lobectomy  11/2004   via VATS and mini-thoractomy for 1.5cm adenoca of lung by  Dr. Arlyce Dice  . LUMBAR LAMINECTOMY/DECOMPRESSION MICRODISCECTOMY Left 01/05/2015   Procedure: L3-4 DECOMPRESSION MICRODISCECTOMY ON LEFT;  Surgeon: Melina Schools, MD;  Location: [place];  Service: Orthopedics;  Laterality: Left;  left Lumbar 3-4  . turmor ear Right 09/2013  . urologic sugery with cystocele/rectocele repairs and sling  03/2007   Dr. Terance Hart   Family History  Problem Relation Age of Onset   . Breast cancer Sister 51  . Dementia Mother   . Heart attack Mother   . CVA Father   . Stroke Father   . Colon cancer Neg Hx   . Esophageal cancer Neg Hx   . Pancreatic cancer Neg Hx   . Rectal cancer Neg Hx   . Stomach cancer Neg Hx    Social History   Social History Narrative   Married (2nd marriage; 1st husband died at age 60) married 30 29 years in 05/2014. 2 sons from first marriage, 3 from 2nd (step). 15 grandchildren. 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 Hobbies: reading, golf, family time.   Social History   Tobacco Use  . Smoking status: Former Smoker    Packs/day: 1.00    Years: 30.00    Pack years: 30.00    Types: Cigarettes    Quit date: 06/17/1984    Years since quitting: 34.4  . Smokeless tobacco: Never Used  Substance Use Topics  . Alcohol use: No    Alcohol/week: 0.0 standard drinks  . Drug use: No    Current Outpatient Medications:  .  acetaminophen (TYLENOL ARTHRITIS PAIN) 650 MG CR tablet, Take 650 mg by mouth every 8 (eight) hours as needed for pain., Disp: , Rfl:  .  Calcium Carbonate-Vitamin D (CALCIUM-VITAMIN D) 500-200 MG-UNIT tablet, Take 1 tablet by mouth daily., Disp: , Rfl:  .  clonazePAM (KLONOPIN) 0.5 MG tablet, Take 1 tablet (0.5 mg total) by mouth 2 (two) times daily as needed. for anxiety, Disp: 60 tablet, Rfl: 0 .  conjugated estrogens (PREMARIN) vaginal cream, Place 1 Applicatorful vaginally as needed., Disp: , Rfl:  .  Cranberry-Vitamin C-Probiotic (AZO CRANBERRY PO), Take by mouth as directed., Disp: , Rfl:  .  escitalopram (LEXAPRO) 10 MG tablet, Take 1 tablet (10 mg total) by mouth daily., Disp: 90 tablet, Rfl: 1 .  Multiple Vitamins-Minerals (MULTIVITAMIN WITH MINERALS) tablet, Take 1 tablet by mouth daily., Disp: , Rfl:  .  simvastatin (ZOCOR) 40 MG tablet, Take 1 tablet (40 mg total) by mouth at bedtime., Disp: 90 tablet, Rfl: 0 .  traMADol (ULTRAM) 50 MG tablet, Take 1 tablet (50 mg total) by mouth every 6 (six) hours as needed.,  Disp: 90 tablet, Rfl: 0 .  zolpidem (AMBIEN) 10 MG tablet, Take 1 tablet (10 mg total) by mouth at bedtime as needed. for sleep, Disp: 30 tablet, Rfl: 0 .  aspirin 81 MG tablet, Take 81 mg by mouth daily., Disp: , Rfl:  .  meclizine (ANTIVERT) 25 MG tablet, Take 1 tablet (25 mg total) by mouth every 8 (eight) hours as needed for dizziness. (Patient not taking: Reported on 12/02/2018), Disp: 30 tablet, Rfl: 0 .  metoCLOPramide (REGLAN) 10 MG tablet, Take 1 tablet (10 mg total) by mouth 4 (four) times daily. (Patient not taking: Reported on 12/02/2018), Disp: 30 tablet, Rfl: 0 .  pantoprazole (PROTONIX) 40 MG tablet, Take 1 tablet (40 mg total) by mouth daily. (Patient not taking: Reported on 12/02/2018), Disp: 30 tablet, Rfl: 2  Activities of Daily Living In your present state of health,  do you have any difficulty performing the following activities: 12/08/2018  Hearing? N  Vision? N  Difficulty concentrating or making decisions? N  Walking or climbing stairs? N  Dressing or bathing? N  Doing errands, shopping? N  Preparing Food and eating ? N  Using the Toilet? N  In the past six months, have you accidently leaked urine? N  Do you have problems with loss of bowel control? N  Managing your Medications? N  Managing your Finances? N  Housekeeping or managing your Housekeeping? N  Some recent data might be hidden   Patient Care Team: Briscoe Deutscher, DO as PCP - General (Family Medicine) Darleen Crocker, MD as Consulting Physician (Ophthalmology) Noralee Space, MD as Consulting Physician (Pulmonary Disease) Magnus Sinning, MD as Consulting Physician (Physical Medicine and Rehabilitation)   Assessment:  This is a routine wellness examination for [place].  Exercise Activities and Dietary recommendations Current Exercise Habits: The patient does not participate in regular exercise at present, Exercise limited by: None identified  Fall Risk Fall Risk  12/02/2018 10/09/2017 10/02/2016 09/12/2014  09/08/2013  Falls in the past year? 0 No No No No   Timed Get Up and Go performed: yes, taking < 12 seconds Note: If takes > 12 seconds to complete, patient at increased risk of falling. Note if patient has slow pace, loss of balance, short strides, little or no arm swings, steadying self on walls, shuffling, not using assist device properly.   Depression Screen PHQ 2/9 Scores 06/29/2018 04/27/2018 12/22/2017 10/09/2017  PHQ - 2 Score 0 0 2 0  PHQ- 9 Score 0 0 2 -    Cognitive Function Minicog completed and normal. Will scan into chart.   Immunization History  Administered Date(s) Administered  . Influenza Split 03/18/2011, 05/04/2012, 04/17/2017  . Influenza Whole 04/02/2006, 03/24/2009, 05/01/2010  . Influenza, High Dose Seasonal PF 03/25/2016, 03/20/2017  . Influenza,inj,Quad PF,6+ Mos 02/28/2014, 04/05/2015  . Influenza-Unspecified 03/17/2013, 02/19/2018  . Pneumococcal Conjugate-13 05/02/2014  . Pneumococcal Polysaccharide-23 05/04/2015  . Tetanus 02/28/2014  . Zoster Recombinat (Shingrix) 11/07/2017, 01/08/2018   Screening Tests Health Maintenance  Topic Date Due  . INFLUENZA VACCINE  01/16/2019  . TETANUS/TDAP  02/29/2024  . DEXA SCAN  Completed  . PNA vac Low Risk Adult  Completed   Plan:   PLEASE NOTE THAT ALL TESTS AND QUESTIONS WERE COMPLETED ON DAY OF VISIT. THIS MAY NOT REFLECT DATE OF SERVICE IF INFORMATION ENTERED ON SUBSEQUENT DAY.  I have personally reviewed and noted the following in the patient's chart:   . Medical and social history . Use of alcohol, tobacco or illicit drugs  . Current medications and supplements . Functional ability and status . Nutritional status . Physical activity . Advanced directives . List of other physicians . Hospitalizations, surgeries, and ER visits in previous 12 months . Vitals . Screenings to include cognitive, depression, and falls . Referrals and appointments  In addition, I have reviewed and discussed with patient  certain preventive protocols, quality metrics, and best practice recommendations. A written personalized care plan for preventive services as well as general preventive health recommendations were provided to patient. AMW QUESTIONS ANSWERED AND SCANNED INTO CHART UNDER MEDIA SECTION.  Briscoe Deutscher, DO  12/08/2018

## 2018-12-02 NOTE — Progress Notes (Deleted)
Krystal Craig is a 85 y.o. female is here for follow up.  History of Present Illness:   (SCRIBE ATTESTATION)  HPI:   There are no preventive care reminders to display for this patient. Depression screen [facility] 2/9 06/29/2018 04/27/2018 12/22/2017  Decreased Interest 0 0 1  Down, Depressed, Hopeless 0 0 1  PHQ - 2 Score 0 0 2  Altered sleeping 0 0 0  Tired, decreased energy 0 0 0  Change in appetite 0 0 0  Feeling bad or failure about yourself  0 0 0  Trouble concentrating 0 0 0  Moving slowly or fidgety/restless 0 0 0  Suicidal thoughts 0 0 0  PHQ-9 Score 0 0 2  Difficult doing work/chores Not difficult at all Somewhat difficult Somewhat difficult   PMHx, SurgHx, SocialHx, FamHx, Medications, and Allergies were reviewed in the Visit Navigator and updated as appropriate.   Patient Active Problem List   Diagnosis Date Noted  . PND (post-nasal drip) 07/01/2018  . History of cholecystectomy 05/31/2018  . Age-related osteoporosis without current pathological fracture, does not want treatment 05/31/2018  . Caregiver burden 05/31/2018  . Insulin resistance 05/31/2018  . Chronic pain syndrome 05/31/2018  . Abnormal findings on diagnostic imaging of other abdominal regions, liver and pancreas 05/31/2018  . Aortic atherosclerosis ([place]) 05/26/2018  . Degenerative arthritis of lumbar spine 05/26/2018  . Situational depression 12/22/2017  . Diverticulosis 11/11/2016  . Hx of cancer of lung, s/p left lobectomy 2006 11/11/2016  . Situational anxiety 11/06/2016  . Primary osteoarthritis of both first carpometacarpal joints 12/28/2015  . Microscopic hematuria, with previous evaluation by Urology and determined to be benign 09/12/2014  . Psoriasis 02/28/2014  . CKD (chronic kidney disease), stage III ([place]) 02/28/2014  . Thoracic lymphadenopathy 09/25/2011  . Hyperlipidemia 09/25/2011  . Insomnia, takes 5 mg Ambien prn 08/03/2008  . Allergic rhinitis 06/26/2007  . GERD 06/26/2007  .  Irritable bowel syndrome 06/26/2007   Social History   Tobacco Use  . Smoking status: Former Smoker    Packs/day: 1.00    Years: 30.00    Pack years: 30.00    Types: Cigarettes    Quit date: 06/17/1984    Years since quitting: 34.4  . Smokeless tobacco: Never Used  Substance Use Topics  . Alcohol use: No    Alcohol/week: 0.0 standard drinks  . Drug use: No   Current Medications and Allergies   Current Outpatient Medications:  .  acetaminophen (TYLENOL ARTHRITIS PAIN) 650 MG CR tablet, Take 650 mg by mouth every 8 (eight) hours as needed for pain., Disp: , Rfl:  .  aspirin 81 MG tablet, Take 81 mg by mouth daily., Disp: , Rfl:  .  azelastine (ASTELIN) 0.1 % nasal spray, Place 1 spray into both nostrils 2 (two) times daily. Use in each nostril as directed (Patient taking differently: Place 1 spray into both nostrils 2 (two) times daily as needed. Use in each nostril as directed), Disp: 30 mL, Rfl: 12 .  Calcium Carbonate-Vitamin D (CALCIUM-VITAMIN D) 500-200 MG-UNIT tablet, Take 1 tablet by mouth daily., Disp: , Rfl:  .  clonazePAM (KLONOPIN) 0.5 MG tablet, Take 1 tablet by mouth twice daily as needed for anxiety, Disp: 60 tablet, Rfl: 0 .  conjugated estrogens (PREMARIN) vaginal cream, Place 1 Applicatorful vaginally as needed., Disp: , Rfl:  .  Cranberry-Vitamin C-Probiotic (AZO CRANBERRY PO), Take by mouth as directed., Disp: , Rfl:  .  escitalopram (LEXAPRO) 10 MG tablet, Take 10 mg  by mouth daily., Disp: , Rfl:  .  meclizine (ANTIVERT) 25 MG tablet, Take 1 tablet (25 mg total) by mouth every 8 (eight) hours as needed for dizziness., Disp: 30 tablet, Rfl: 0 .  metoCLOPramide (REGLAN) 10 MG tablet, Take 1 tablet (10 mg total) by mouth 4 (four) times daily., Disp: 30 tablet, Rfl: 0 .  Multiple Vitamins-Minerals (MULTIVITAMIN WITH MINERALS) tablet, Take 1 tablet by mouth daily., Disp: , Rfl:  .  ondansetron (ZOFRAN) 8 MG tablet, Take 1 tablet (8 mg total) by mouth every 8 (eight) hours  as needed for nausea or vomiting., Disp: 60 tablet, Rfl: 0 .  pantoprazole (PROTONIX) 40 MG tablet, Take 1 tablet (40 mg total) by mouth daily., Disp: 30 tablet, Rfl: 2 .  simvastatin (ZOCOR) 40 MG tablet, TAKE 1 TABLET BY MOUTH AT BEDTIME, Disp: 90 tablet, Rfl: 0 .  sulfamethoxazole-trimethoprim (BACTRIM DS) 800-160 MG tablet, Take 1 tablet by mouth 2 (two) times daily., Disp: 6 tablet, Rfl: 0 .  traMADol (ULTRAM) 50 MG tablet, Take 1 tablet (50 mg total) by mouth every 6 (six) hours as needed., Disp: 90 tablet, Rfl: 0 .  zolpidem (AMBIEN) 10 MG tablet, Take 1 tablet (10 mg total) by mouth at bedtime as needed. for sleep, Disp: 30 tablet, Rfl: 0  Allergies  Allergen Reactions  . Ciprocin-Fluocin-Procin [Fluocinolone] Other (See Comments)    Leg and arm numbness  . Codeine     Chest pain/tightness  . Wellbutrin [Bupropion] Rash   Review of Systems   Pertinent items are noted in the HPI. Otherwise, a complete ROS is negative.  Vitals  There were no vitals filed for this visit.   There is no height or weight on file to calculate BMI.  Physical Exam   Physical Exam  Results for orders placed or performed in visit on 07/28/18  Urine Culture   Specimen: Urine  Result Value Ref Range   MICRO NUMBER: [id number]    SPECIMEN QUALITY: Adequate    Sample Source URINE    STATUS: FINAL    ISOLATE 1:      Single organism less than 10,000 CFU/mL isolated. These organisms, commonly found on external and internal genitalia, are considered colonizers. No further testing performed.    Assessment and Plan   There are no diagnoses linked to this encounter.  . Orders and follow up as documented in [place], reviewed diet, exercise and weight control, cardiovascular risk and specific lipid/LDL goals reviewed, reviewed medications and side effects in detail.  . Reviewed expectations re: course of current medical issues. . Outlined signs and symptoms indicating need for more acute  intervention. . Patient verbalized understanding and all questions were answered. . Patient received an After Visit Summary.  *** CMA served as Education administrator during this visit. History, Physical, and Plan performed by medical provider. The above documentation has been reviewed and is accurate and complete. Briscoe Deutscher, D.O.  Briscoe Deutscher, DO [place], [place] [facility] 12/02/2018

## 2018-12-16 DIAGNOSIS — Z1231 Encounter for screening mammogram for malignant neoplasm of breast: Secondary | ICD-10-CM | POA: Diagnosis not present

## 2018-12-16 DIAGNOSIS — Z803 Family history of malignant neoplasm of breast: Secondary | ICD-10-CM | POA: Diagnosis not present

## 2018-12-16 LAB — HM MAMMOGRAPHY

## 2019-01-06 ENCOUNTER — Telehealth: Payer: Self-pay | Admitting: Family Medicine

## 2019-01-06 DIAGNOSIS — F5101 Primary insomnia: Secondary | ICD-10-CM

## 2019-01-06 MED ORDER — CLONAZEPAM 0.5 MG PO TABS: 0.5000 mg | ORAL_TABLET | Freq: Two times a day (BID) | ORAL | 2 refills | Status: DC | PRN

## 2019-01-06 NOTE — Telephone Encounter (Signed)
See note

## 2019-01-06 NOTE — Telephone Encounter (Signed)
Rx request Last fill 12/02/18  #60/0 Last OV 12/02/18

## 2019-01-06 NOTE — Telephone Encounter (Signed)
Copied from [place] [id number]. Topic: Quick Communication - Rx Refill/Question >> Jan 06, 2019 11:09 AM Leward Quan A wrote: Medication: clonazePAM (KLONOPIN) 0.5 MG tablet   Has the patient contacted their pharmacy? Yes.   (Agent: If no, request that the patient contact the pharmacy for the refill.) (Agent: If yes, when and what did the pharmacy advise?)  Preferred Pharmacy (with phone number or street name): [facility] [address], [place] [phone] (Phone) [phone] (Fax)    Agent: Please be advised that RX refills may take up to 3 business days. We ask that you follow-up with your pharmacy.

## 2019-01-12 ENCOUNTER — Encounter: Payer: Self-pay | Admitting: Gynecology

## 2019-01-12 ENCOUNTER — Other Ambulatory Visit: Payer: Self-pay

## 2019-01-12 ENCOUNTER — Ambulatory Visit (INDEPENDENT_AMBULATORY_CARE_PROVIDER_SITE_OTHER): Payer: Medicare Other | Admitting: Gynecology

## 2019-01-12 VITALS — BP 158/82

## 2019-01-12 DIAGNOSIS — R350 Frequency of micturition: Secondary | ICD-10-CM | POA: Diagnosis not present

## 2019-01-12 DIAGNOSIS — N3000 Acute cystitis without hematuria: Secondary | ICD-10-CM | POA: Diagnosis not present

## 2019-01-12 MED ORDER — SULFAMETHOXAZOLE-TRIMETHOPRIM 800-160 MG PO TABS: 1.0000 | ORAL_TABLET | Freq: Two times a day (BID) | ORAL | 0 refills | Status: DC

## 2019-01-12 NOTE — Progress Notes (Signed)
    Krystal Craig 09-13-1932 [id number]        85 y.o.  G2P2 presents with several days of urinary frequency and dysuria.  No urgency, low back pain, fever or chills.  No vaginal symptoms such as discharge or irritation.  Recently treated by Izora Gala in May for UTI with Septra x3 days with good results.  Past medical history,surgical history, problem list, medications, allergies, family history and social history were all reviewed and documented in the EPIC chart.  Directed ROS with pertinent positives and negatives documented in the history of present illness/assessment and plan.  Exam: Vitals:   01/12/19 1206  BP: (!) 158/82   General appearance:  Normal Spine straight without CVA tenderness Abdomen soft nontender without masses guarding rebound  Assessment/Plan:  85 y.o. G2P2 with history and urine analysis consistent with UTI.  She did well with Septra previously.  Will treat with Septra DS 1 p.o. twice daily x5 days.  Follow-up if symptoms persist, worsen or recur.  Blood pressure 158/82 noted to the patient.   Anastasio Auerbach MD, 12:51 PM 01/12/2019

## 2019-01-12 NOTE — Addendum Note (Signed)
Addended by: Lorine Bears on: 01/12/2019 02:10 PM   Modules accepted: Orders

## 2019-01-12 NOTE — Patient Instructions (Addendum)
Take the antibiotic twice daily for 5 days.  Follow-up if your urinary symptoms continue.  Your blood pressure today is 158/82.  I would have it rechecked to make sure that it is lower.  If it remains elevated then you will need to see your primary physician.

## 2019-01-13 ENCOUNTER — Telehealth: Payer: Self-pay | Admitting: *Deleted

## 2019-01-13 MED ORDER — NITROFURANTOIN MONOHYD MACRO 100 MG PO CAPS: 100.0000 mg | ORAL_CAPSULE | Freq: Two times a day (BID) | ORAL | 0 refills | Status: DC

## 2019-01-13 NOTE — Telephone Encounter (Signed)
Patient was seen on 01/12/19 and prescribed Bactrim DS twice daily x 5 days, patient said medication is making her feel nauseated takes with food, even taking with Reglan still very nauseated. She asked if another medication can be sent to pharmacy? Please advise

## 2019-01-13 NOTE — Telephone Encounter (Signed)
Macrobid 100 mg twice daily x5 days

## 2019-01-13 NOTE — Telephone Encounter (Signed)
Patient informed, Rx sent.  

## 2019-01-14 LAB — URINE CULTURE
MICRO NUMBER:: 715690
SPECIMEN QUALITY:: ADEQUATE

## 2019-01-14 LAB — URINALYSIS, COMPLETE W/RFL CULTURE: Hyaline Cast: NONE SEEN /LPF

## 2019-01-14 LAB — CULTURE INDICATED

## 2019-01-15 ENCOUNTER — Emergency Department (HOSPITAL_COMMUNITY)
Admission: EM | Admit: 2019-01-15 | Discharge: 2019-01-15 | Disposition: A | Discharge status: Home / Self Care | Payer: Medicare Other | Attending: Emergency Medicine | Admitting: Emergency Medicine

## 2019-01-15 ENCOUNTER — Other Ambulatory Visit: Payer: Self-pay

## 2019-01-15 ENCOUNTER — Telehealth: Payer: Self-pay | Admitting: Family Medicine

## 2019-01-15 ENCOUNTER — Ambulatory Visit (INDEPENDENT_AMBULATORY_CARE_PROVIDER_SITE_OTHER)
Admission: EM | Admit: 2019-01-15 | Discharge: 2019-01-15 | Disposition: A | Discharge status: Other Acute Inpatient Hospital | Payer: Medicare Other | Source: Home / Self Care

## 2019-01-15 ENCOUNTER — Encounter (HOSPITAL_COMMUNITY): Payer: Self-pay | Admitting: Emergency Medicine

## 2019-01-15 ENCOUNTER — Telehealth: Payer: Self-pay | Admitting: *Deleted

## 2019-01-15 DIAGNOSIS — N3001 Acute cystitis with hematuria: Secondary | ICD-10-CM | POA: Diagnosis not present

## 2019-01-15 DIAGNOSIS — Z7982 Long term (current) use of aspirin: Secondary | ICD-10-CM | POA: Diagnosis not present

## 2019-01-15 DIAGNOSIS — R110 Nausea: Secondary | ICD-10-CM | POA: Diagnosis not present

## 2019-01-15 DIAGNOSIS — N390 Urinary tract infection, site not specified: Secondary | ICD-10-CM | POA: Diagnosis not present

## 2019-01-15 DIAGNOSIS — Z87891 Personal history of nicotine dependence: Secondary | ICD-10-CM | POA: Diagnosis not present

## 2019-01-15 DIAGNOSIS — Z85118 Personal history of other malignant neoplasm of bronchus and lung: Secondary | ICD-10-CM | POA: Diagnosis not present

## 2019-01-15 DIAGNOSIS — N183 Chronic kidney disease, stage 3 (moderate): Secondary | ICD-10-CM | POA: Insufficient documentation

## 2019-01-15 LAB — CBC
HCT: 38.3 % (ref 36.0–46.0)
Hemoglobin: 12.4 g/dL (ref 12.0–15.0)
MCH: 29.5 pg (ref 26.0–34.0)
MCHC: 32.4 g/dL (ref 30.0–36.0)
MCV: 91 fL (ref 80.0–100.0)
Platelets: 314 10*3/uL (ref 150–400)
RBC: 4.21 MIL/uL (ref 3.87–5.11)
RDW: 13.1 % (ref 11.5–15.5)
WBC: 7.3 10*3/uL (ref 4.0–10.5)
nRBC: 0 % (ref 0.0–0.2)

## 2019-01-15 LAB — POCT URINALYSIS DIP (DEVICE)
Bilirubin Urine: NEGATIVE
Glucose, UA: NEGATIVE mg/dL
Ketones, ur: 80 mg/dL — AB
Leukocytes,Ua: NEGATIVE
Nitrite: POSITIVE — AB
Protein, ur: >=300 mg/dL — AB
Specific Gravity, Urine: 1.025 (ref 1.005–1.030)
Urobilinogen, UA: 0.2 mg/dL (ref 0.0–1.0)
pH: 6 (ref 5.0–8.0)

## 2019-01-15 LAB — URINALYSIS, ROUTINE W REFLEX MICROSCOPIC
Bilirubin Urine: NEGATIVE
Glucose, UA: NEGATIVE mg/dL
Ketones, ur: 20 mg/dL — AB
Leukocytes,Ua: NEGATIVE
Nitrite: POSITIVE — AB
Protein, ur: >=300 mg/dL — AB
Specific Gravity, Urine: 1.016 (ref 1.005–1.030)
pH: 6 (ref 5.0–8.0)

## 2019-01-15 LAB — COMPREHENSIVE METABOLIC PANEL
ALT: 25 U/L (ref 0–44)
AST: 28 U/L (ref 15–41)
Albumin: 4.1 g/dL (ref 3.5–5.0)
Alkaline Phosphatase: 71 U/L (ref 38–126)
Anion gap: 13 (ref 5–15)
BUN: 10 mg/dL (ref 8–23)
CO2: 23 mmol/L (ref 22–32)
Calcium: 9.6 mg/dL (ref 8.9–10.3)
Chloride: 99 mmol/L (ref 98–111)
Creatinine, Ser: 0.92 mg/dL (ref 0.44–1.00)
GFR calc Af Amer: >60 mL/min (ref >60)
GFR calc non Af Amer: 58 mL/min — ABNORMAL LOW (ref >60)
Glucose, Bld: 119 mg/dL — ABNORMAL HIGH (ref 70–99)
Potassium: 4 mmol/L (ref 3.5–5.1)
Sodium: 135 mmol/L (ref 135–145)
Total Bilirubin: 1.7 mg/dL — ABNORMAL HIGH (ref 0.3–1.2)
Total Protein: 6.7 g/dL (ref 6.5–8.1)

## 2019-01-15 LAB — LIPASE, BLOOD: Lipase: 37 U/L (ref 11–51)

## 2019-01-15 MED ORDER — PROCHLORPERAZINE EDISYLATE 10 MG/2ML IJ SOLN
10.0000 mg | Freq: Once | INTRAMUSCULAR | Status: AC
  Administered 2019-01-15: 10 mg via INTRAMUSCULAR
  Filled 2019-01-15: qty 2

## 2019-01-15 MED ORDER — ONDANSETRON 4 MG PO TBDP
4.0000 mg | ORAL_TABLET | Freq: Once | ORAL | Status: AC
  Administered 2019-01-15: 4 mg via ORAL
  Filled 2019-01-15: qty 1

## 2019-01-15 MED ORDER — ONDANSETRON 4 MG PO TBDP: 4.0000 mg | ORAL_TABLET | Freq: Three times a day (TID) | ORAL | 0 refills | Status: DC | PRN

## 2019-01-15 MED ORDER — ONDANSETRON 4 MG PO TBDP
4.0000 mg | ORAL_TABLET | Freq: Once | ORAL | Status: AC | PRN
  Administered 2019-01-15: 4 mg via ORAL
  Filled 2019-01-15: qty 1

## 2019-01-15 MED ORDER — CEPHALEXIN 500 MG PO CAPS: 500.0000 mg | ORAL_CAPSULE | Freq: Two times a day (BID) | ORAL | 0 refills | Status: AC

## 2019-01-15 MED ORDER — ONDANSETRON HCL 4 MG PO TABS: 4.0000 mg | ORAL_TABLET | Freq: Four times a day (QID) | ORAL | 0 refills | Status: DC | PRN

## 2019-01-15 NOTE — Discharge Instructions (Addendum)
85 year old female with history of lung malignancy status post lobectomy, prediabetes, aortic atherosclerosis, and recent UTI with culture showing Klebsiella, currently taking Macrobid, presenting for severe nausea.  EKG stable from last in 2016.  Patient reports compliance with Macrobid, not having resolution of symptoms.  Offered in-house medications for nausea and alternate therapy for persistent UTI, patient declined stating antiemetics do not work.  Patient requesting to go to ER for further evaluation.

## 2019-01-15 NOTE — Telephone Encounter (Signed)
Agree with Zofran 4 mg PO q6 hours PRN.  #20.

## 2019-01-15 NOTE — ED Triage Notes (Signed)
Pt sts nausea x 2 days; pt sts vaginal discomfort just started today; denies dysuria

## 2019-01-15 NOTE — Telephone Encounter (Signed)
(  Dr.Fontaine patient ) Dr.Lavoie please see below, would you be willing to prescribe something to help with nausea?

## 2019-01-15 NOTE — ED Provider Notes (Signed)
[facility]    CSN: [id number] Arrival date & time: 01/15/19  1253     History   Chief Complaint Chief Complaint  Patient presents with  . Nausea    HPI Krystal Craig is a 85 y.o. female with history of IBS, CKD stage III, diaphragmatic hernia, malignant neoplasm of bronchus and lung presenting for 2-day course of constant nausea.  Patient denies exacerbating or alleviating factors.  Has tried Zofran in the past without relief.  Patient also states Phenergan does not help.  Patient denies vomiting, headache, change in vision, chest pain, shortness of breath, abdominal pain, dysuria, vaginal discharge, diarrhea, blood or melena in her stool.  Upon chart review, patient was seen on 7/28 by her gynecologist: Diagnosed with acute cystitis and given Macrobid.  Patient states that she has been compliant with this, started the day after her visit.  Then endorses dysuria that is not improved since starting Macrobid.   Past Medical History:  Diagnosis Date  . Allergic rhinitis   . Chest pain with normal stress test, 2013   . Chronic kidney disease, stage III (moderate) (HCC)   . Complication of anesthesia   . Diaphragmatic hernia 06/26/2007  . Diverticulosis   . Hiatal hernia   . History of bronchitis   . IBS (irritable bowel syndrome)   . Impacted cerumen of both ears   . Insomnia   . Lumbar back pain   . Major depression in partial remission ([place])   . Malignant neoplasm of bronchus and lung, 2006   . Osteoarthritis   . Osteopenia   . PONV (postoperative nausea and vomiting)   . Pre-diabetes   . Psoriasis   . Pure hypercholesterolemia   . Vitamin D deficiency     Patient Active Problem List   Diagnosis Date Noted  . PND (post-nasal drip) 07/01/2018  . History of cholecystectomy 05/31/2018  . Age-related osteoporosis without current pathological fracture, does not want treatment 05/31/2018  . Caregiver burden 05/31/2018  . Insulin resistance 05/31/2018  .  Chronic pain syndrome 05/31/2018  . Abnormal findings on diagnostic imaging of other abdominal regions, liver and pancreas 05/31/2018  . Aortic atherosclerosis ([place]) 05/26/2018  . Degenerative arthritis of lumbar spine 05/26/2018  . Situational depression 12/22/2017  . Diverticulosis 11/11/2016  . Hx of cancer of lung, s/p left lobectomy 2006 11/11/2016  . Situational anxiety 11/06/2016  . Primary osteoarthritis of both first carpometacarpal joints 12/28/2015  . Microscopic hematuria, with previous evaluation by Urology and determined to be benign 09/12/2014  . Psoriasis, followed by [facility] 02/28/2014  . CKD (chronic kidney disease), stage III ([place]) 02/28/2014  . Thoracic lymphadenopathy 09/25/2011  . Hyperlipidemia, Rx Zocor 09/25/2011  . Insomnia, takes 5 mg Ambien prn 08/03/2008  . Allergic rhinitis 06/26/2007  . GERD 06/26/2007  . Irritable bowel syndrome 06/26/2007    Past Surgical History:  Procedure Laterality Date  . ABDOMINAL HYSTERECTOMY  1969  . APPENDECTOMY    . bilateral shouler operations for impingement syndromes Bilateral 2001  . BUNIONECTOMY Left   . CATARACT EXTRACTION, BILATERAL Bilateral 2012  . CHOLECYSTECTOMY  1976  . LAPAROSCOPIC LYSIS INTESTINAL ADHESIONS    . left lower lobectomy  11/2004   via VATS and mini-thoractomy for 1.5cm adenoca of lung by Dr. Arlyce Dice  . LUMBAR LAMINECTOMY/DECOMPRESSION MICRODISCECTOMY Left 01/05/2015   Procedure: L3-4 DECOMPRESSION MICRODISCECTOMY ON LEFT;  Surgeon: Melina Schools, MD;  Location: [facility];  Service: Orthopedics;  Laterality: Left;  left Lumbar 3-4  .  turmor ear Right 09/2013  . urologic sugery with cystocele/rectocele repairs and sling  03/2007   Dr. Terance Hart    OB History    Gravida  2   Para  2   Term      Preterm      AB      Living  2     SAB      TAB      Ectopic      Multiple      Live Births               Home Medications    Prior to Admission medications   Medication Sig  Start Date End Date Taking? Authorizing Provider  acetaminophen (TYLENOL ARTHRITIS PAIN) 650 MG CR tablet Take 650 mg by mouth every 8 (eight) hours as needed for pain.    [provider]  aspirin 81 MG tablet Take 81 mg by mouth daily.    [provider]  Calcium Carbonate-Vitamin D (CALCIUM-VITAMIN D) 500-200 MG-UNIT tablet Take 1 tablet by mouth daily.    [provider]  cephALEXin (KEFLEX) 500 MG capsule Take 1 capsule (500 mg total) by mouth 2 (two) times daily for 5 days. 01/15/19 01/20/19  Curatolo, Adam, DO  clonazePAM (KLONOPIN) 0.5 MG tablet Take 1 tablet (0.5 mg total) by mouth 2 (two) times daily as needed. for anxiety 01/06/19   Briscoe Deutscher, DO  conjugated estrogens (PREMARIN) vaginal cream Place 1 Applicatorful vaginally as needed.    [provider]  Cranberry-Vitamin C-Probiotic (AZO CRANBERRY PO) Take by mouth as directed.    [provider]  escitalopram (LEXAPRO) 10 MG tablet Take 1 tablet (10 mg total) by mouth daily. 12/02/18   Briscoe Deutscher, DO  Multiple Vitamins-Minerals (MULTIVITAMIN WITH MINERALS) tablet Take 1 tablet by mouth daily.    [provider]  ondansetron (ZOFRAN-ODT) 4 MG disintegrating tablet Take 1 tablet (4 mg total) by mouth every 8 (eight) hours as needed for up to 15 doses for nausea or vomiting. 01/15/19   Curatolo, Adam, DO  simvastatin (ZOCOR) 40 MG tablet Take 1 tablet (40 mg total) by mouth at bedtime. 12/02/18   Briscoe Deutscher, DO  traMADol (ULTRAM) 50 MG tablet Take 1 tablet (50 mg total) by mouth every 6 (six) hours as needed. 12/02/18   Briscoe Deutscher, DO  zolpidem (AMBIEN) 10 MG tablet Take 1 tablet (10 mg total) by mouth at bedtime as needed. for sleep 12/02/18   Briscoe Deutscher, DO    Family History Family History  Problem Relation Age of Onset  . Breast cancer Sister 29  . Dementia Mother   . Heart attack Mother   . CVA Father   . Stroke Father   . Colon cancer Neg Hx   . Esophageal cancer  Neg Hx   . Pancreatic cancer Neg Hx   . Rectal cancer Neg Hx   . Stomach cancer Neg Hx     Social History Social History   Tobacco Use  . Smoking status: Former Smoker    Packs/day: 1.00    Years: 30.00    Pack years: 30.00    Types: Cigarettes    Quit date: 06/17/1984    Years since quitting: 34.6  . Smokeless tobacco: Never Used  Substance Use Topics  . Alcohol use: No    Alcohol/week: 0.0 standard drinks  . Drug use: No     Allergies   Ciprocin-fluocin-procin [fluocinolone], Codeine, and Wellbutrin [bupropion]   Review of  Systems Review of Systems  Constitutional: Negative for activity change, appetite change, fatigue and fever.  HENT: Negative for ear pain, sinus pain, sore throat and voice change.   Eyes: Negative for pain, redness and visual disturbance.  Respiratory: Negative for cough, shortness of breath, wheezing and stridor.   Cardiovascular: Negative for chest pain and palpitations.  Gastrointestinal: Positive for nausea. Negative for abdominal pain, diarrhea and vomiting.  Genitourinary: Positive for dysuria and vaginal pain. Negative for frequency, hematuria, pelvic pain, urgency, vaginal bleeding and vaginal discharge.  Musculoskeletal: Negative for arthralgias and myalgias.  Skin: Negative for rash and wound.  Neurological: Positive for weakness. Negative for dizziness, tremors, syncope, speech difficulty, light-headedness and headaches.     Physical Exam Triage Vital Signs ED Triage Vitals  Enc Vitals Group     BP 01/15/19 1326 (!) 174/99     Pulse Rate 01/15/19 1326 85     Resp 01/15/19 1326 18     Temp 01/15/19 1326 98.5 F (36.9 C)     Temp Source 01/15/19 1326 Oral     SpO2 01/15/19 1326 95 %     Weight --      Height --      Head Circumference --      Peak Flow --      Pain Score 01/15/19 1327 3     Pain Loc --      Pain Edu? --      Excl. in [place]? --    No data found.  Updated Vital Signs BP (!) 174/99 (BP Location: Right Arm)    Pulse 85   Temp 98.5 F (36.9 C) (Oral)   Resp 18   LMP  (LMP Unknown)   SpO2 95%    Physical Exam Constitutional:      General: She is not in acute distress.    Appearance: She is normal weight. She is not toxic-appearing.  HENT:     Head: Normocephalic and atraumatic.     Right Ear: Tympanic membrane and ear canal normal.     Left Ear: Tympanic membrane, ear canal and external ear normal.     Nose: Nose normal.     Mouth/Throat:     Mouth: Mucous membranes are moist.     Pharynx: Oropharynx is clear. No oropharyngeal exudate or posterior oropharyngeal erythema.  Eyes:     General: No scleral icterus.    Conjunctiva/sclera: Conjunctivae normal.     Pupils: Pupils are equal, round, and reactive to light.  Neck:     Musculoskeletal: Normal range of motion and neck supple. No muscular tenderness.  Cardiovascular:     Rate and Rhythm: Normal rate.  Pulmonary:     Effort: Pulmonary effort is normal.  Abdominal:     General: Abdomen is flat. Bowel sounds are normal.     Palpations: Abdomen is soft.     Tenderness: There is no abdominal tenderness. There is no right CVA tenderness, left CVA tenderness, guarding or rebound.  Lymphadenopathy:     Cervical: No cervical adenopathy.  Skin:    General: Skin is warm.     Capillary Refill: Capillary refill takes less than 2 seconds.     Coloration: Skin is not jaundiced or pale.  Neurological:     General: No focal deficit present.     Mental Status: She is alert and oriented to person, place, and time.  Psychiatric:        Mood and Affect: Mood normal.  Behavior: Behavior normal.      UC Treatments / Results  Labs (all labs ordered are listed, but only abnormal results are displayed) Labs Reviewed  POCT URINALYSIS DIP (DEVICE) - Abnormal; Notable for the following components:      Result Value   Ketones, ur 80 (*)    Hgb urine dipstick MODERATE (*)    Protein, ur >=300 (*)    Nitrite POSITIVE (*)    All other  components within normal limits    EKG   Radiology No results found.  Procedures Procedures (including critical care time)  Medications Ordered in UC Medications - No data to display  Initial Impression / Assessment and Plan / UC Course  I have reviewed the triage vital signs and the nursing notes.  Pertinent labs & imaging results that were available during my care of the patient were reviewed by me and considered in my medical decision making (see chart for details).     1. Nausea Patient declined Zofran ODT in office as "this does not ever help with my nausea ".  EKG done in office given patient's age, comorbidities, which was reviewed by me and compared to previous from 03/08/2015: NSR without prolonged QTc no ST elevation.  Waveforms largely stable/unchanged.  2. Acute cystitis with hematuria Patient reporting compliance with Macrobid for uncomplicated cystitis.  Urine culture done 7/28 showing Klebsiella pneumonia susceptible to Macrobid, Keflex.  Offered to change antibiotic to Keflex to better treat UTI, patient declined stating "I need to have something done about my nausea because something is wrong ".  Tried providing reassurance that symptom should resolve with Keflex which would help nausea improve and that there is low concern for life-threatening process at this time given history, physical, vitals, EKG.  Patient requesting to go to ER, would like her husband to bring her.  Final Clinical Impressions(s) / UC Diagnoses   Final diagnoses:  Nausea  Acute cystitis with hematuria     Discharge Instructions     85 year old female with history of lung malignancy status post lobectomy, prediabetes, aortic atherosclerosis, and recent UTI with culture showing Klebsiella, currently taking Macrobid, presenting for severe nausea.  EKG stable from last in 2016.  Patient reports compliance with Macrobid, not having resolution of symptoms.  Offered in-house medications for nausea  and alternate therapy for persistent UTI, patient declined stating antiemetics do not work.  Patient requesting to go to ER for further evaluation.    ED Prescriptions    None     Controlled Substance Prescriptions [place] Controlled Substance Registry consulted? Not Applicable   Quincy Sheehan, [place] 01/16/19 1405

## 2019-01-15 NOTE — ED Provider Notes (Signed)
[facility] EMERGENCY DEPARTMENT Provider Note   CSN: [id number] Arrival date & time: 01/15/19  1428    History   Chief Complaint Chief Complaint  Patient presents with  . Nausea    HPI Krystal Craig is a 85 y.o. female.     The history is provided by the patient.  Dysuria Pain quality:  Aching Pain severity:  Mild Onset quality:  Gradual Timing:  Constant Progression:  Unchanged Chronicity:  New Relieved by:  Nothing Worsened by:  Nothing Urinary symptoms: foul-smelling urine   Associated symptoms: nausea   Associated symptoms: no abdominal pain, no fever, no vaginal discharge and no vomiting     Past Medical History:  Diagnosis Date  . Allergic rhinitis   . Chest pain with normal stress test, 2013   . Chronic kidney disease, stage III (moderate) (HCC)   . Complication of anesthesia   . Diaphragmatic hernia 06/26/2007  . Diverticulosis   . Hiatal hernia   . History of bronchitis   . IBS (irritable bowel syndrome)   . Impacted cerumen of both ears   . Insomnia   . Lumbar back pain   . Major depression in partial remission ([place])   . Malignant neoplasm of bronchus and lung, 2006   . Osteoarthritis   . Osteopenia   . PONV (postoperative nausea and vomiting)   . Pre-diabetes   . Psoriasis   . Pure hypercholesterolemia   . Vitamin D deficiency     Patient Active Problem List   Diagnosis Date Noted  . PND (post-nasal drip) 07/01/2018  . History of cholecystectomy 05/31/2018  . Age-related osteoporosis without current pathological fracture, does not want treatment 05/31/2018  . Caregiver burden 05/31/2018  . Insulin resistance 05/31/2018  . Chronic pain syndrome 05/31/2018  . Abnormal findings on diagnostic imaging of other abdominal regions, liver and pancreas 05/31/2018  . Aortic atherosclerosis ([place]) 05/26/2018  . Degenerative arthritis of lumbar spine 05/26/2018  . Situational depression 12/22/2017  . Diverticulosis 11/11/2016   . Hx of cancer of lung, s/p left lobectomy 2006 11/11/2016  . Situational anxiety 11/06/2016  . Primary osteoarthritis of both first carpometacarpal joints 12/28/2015  . Microscopic hematuria, with previous evaluation by Urology and determined to be benign 09/12/2014  . Psoriasis, followed by [facility] 02/28/2014  . CKD (chronic kidney disease), stage III ([place]) 02/28/2014  . Thoracic lymphadenopathy 09/25/2011  . Hyperlipidemia, Rx Zocor 09/25/2011  . Insomnia, takes 5 mg Ambien prn 08/03/2008  . Allergic rhinitis 06/26/2007  . GERD 06/26/2007  . Irritable bowel syndrome 06/26/2007    Past Surgical History:  Procedure Laterality Date  . ABDOMINAL HYSTERECTOMY  1969  . APPENDECTOMY    . bilateral shouler operations for impingement syndromes Bilateral 2001  . BUNIONECTOMY Left   . CATARACT EXTRACTION, BILATERAL Bilateral 2012  . CHOLECYSTECTOMY  1976  . LAPAROSCOPIC LYSIS INTESTINAL ADHESIONS    . left lower lobectomy  11/2004   via VATS and mini-thoractomy for 1.5cm adenoca of lung by Dr. Arlyce Dice  . LUMBAR LAMINECTOMY/DECOMPRESSION MICRODISCECTOMY Left 01/05/2015   Procedure: L3-4 DECOMPRESSION MICRODISCECTOMY ON LEFT;  Surgeon: Melina Schools, MD;  Location: [facility];  Service: Orthopedics;  Laterality: Left;  left Lumbar 3-4  . turmor ear Right 09/2013  . urologic sugery with cystocele/rectocele repairs and sling  03/2007   Dr. Terance Hart     OB History    Gravida  2   Para  2   Term      Preterm  AB      Living  2     SAB      TAB      Ectopic      Multiple      Live Births               Home Medications    Prior to Admission medications   Medication Sig Start Date End Date Taking? Authorizing Provider  acetaminophen (TYLENOL ARTHRITIS PAIN) 650 MG CR tablet Take 650 mg by mouth every 8 (eight) hours as needed for pain.    [provider]  aspirin 81 MG tablet Take 81 mg by mouth daily.    [provider]  Calcium  Carbonate-Vitamin D (CALCIUM-VITAMIN D) 500-200 MG-UNIT tablet Take 1 tablet by mouth daily.    [provider]  cephALEXin (KEFLEX) 500 MG capsule Take 1 capsule (500 mg total) by mouth 2 (two) times daily for 5 days. 01/15/19 01/20/19  Jarelyn Bambach, DO  clonazePAM (KLONOPIN) 0.5 MG tablet Take 1 tablet (0.5 mg total) by mouth 2 (two) times daily as needed. for anxiety 01/06/19   Briscoe Deutscher, DO  conjugated estrogens (PREMARIN) vaginal cream Place 1 Applicatorful vaginally as needed.    [provider]  Cranberry-Vitamin C-Probiotic (AZO CRANBERRY PO) Take by mouth as directed.    [provider]  escitalopram (LEXAPRO) 10 MG tablet Take 1 tablet (10 mg total) by mouth daily. 12/02/18   Briscoe Deutscher, DO  Multiple Vitamins-Minerals (MULTIVITAMIN WITH MINERALS) tablet Take 1 tablet by mouth daily.    [provider]  ondansetron (ZOFRAN-ODT) 4 MG disintegrating tablet Take 1 tablet (4 mg total) by mouth every 8 (eight) hours as needed for up to 15 doses for nausea or vomiting. 01/15/19   Jakyiah Briones, DO  simvastatin (ZOCOR) 40 MG tablet Take 1 tablet (40 mg total) by mouth at bedtime. 12/02/18   Briscoe Deutscher, DO  traMADol (ULTRAM) 50 MG tablet Take 1 tablet (50 mg total) by mouth every 6 (six) hours as needed. 12/02/18   Briscoe Deutscher, DO  zolpidem (AMBIEN) 10 MG tablet Take 1 tablet (10 mg total) by mouth at bedtime as needed. for sleep 12/02/18   Briscoe Deutscher, DO    Family History Family History  Problem Relation Age of Onset  . Breast cancer Sister 13  . Dementia Mother   . Heart attack Mother   . CVA Father   . Stroke Father   . Colon cancer Neg Hx   . Esophageal cancer Neg Hx   . Pancreatic cancer Neg Hx   . Rectal cancer Neg Hx   . Stomach cancer Neg Hx     Social History Social History   Tobacco Use  . Smoking status: Former Smoker    Packs/day: 1.00    Years: 30.00    Pack years: 30.00    Types: Cigarettes    Quit date: 06/17/1984     Years since quitting: 34.6  . Smokeless tobacco: Never Used  Substance Use Topics  . Alcohol use: No    Alcohol/week: 0.0 standard drinks  . Drug use: No     Allergies   Ciprocin-fluocin-procin [fluocinolone], Codeine, and Wellbutrin [bupropion]   Review of Systems Review of Systems  Constitutional: Negative for chills and fever.  HENT: Negative for ear pain and sore throat.   Eyes: Negative for pain and visual disturbance.  Respiratory: Negative for cough and shortness of breath.   Cardiovascular: Negative for chest pain and palpitations.  Gastrointestinal:  Positive for nausea. Negative for abdominal pain and vomiting.  Genitourinary: Positive for dysuria. Negative for hematuria and vaginal discharge.  Musculoskeletal: Negative for arthralgias and back pain.  Skin: Negative for color change and rash.  Neurological: Negative for seizures and syncope.  All other systems reviewed and are negative.    Physical Exam Updated Vital Signs  ED Triage Vitals  Enc Vitals Group     BP 01/15/19 1435 (!) 183/82     Pulse Rate 01/15/19 1435 88     Resp 01/15/19 1435 20     Temp 01/15/19 1435 98.2 F (36.8 C)     Temp Source 01/15/19 1435 Oral     SpO2 01/15/19 1435 99 %     Weight --      Height --      Head Circumference --      Peak Flow --      Pain Score 01/15/19 1441 4     Pain Loc --      Pain Edu? --      Excl. in [place]? --     Physical Exam Vitals signs and nursing note reviewed.  Constitutional:      General: She is not in acute distress.    Appearance: She is well-developed.  HENT:     Head: Normocephalic and atraumatic.     Nose: Nose normal.     Mouth/Throat:     Mouth: Mucous membranes are moist.  Eyes:     Extraocular Movements: Extraocular movements intact.     Conjunctiva/sclera: Conjunctivae normal.     Pupils: Pupils are equal, round, and reactive to light.  Neck:     Musculoskeletal: Neck supple.  Cardiovascular:     Rate and Rhythm: Normal rate  and regular rhythm.     Pulses: Normal pulses.     Heart sounds: Normal heart sounds. No murmur.  Pulmonary:     Effort: Pulmonary effort is normal. No respiratory distress.     Breath sounds: Normal breath sounds.  Abdominal:     General: Abdomen is flat.     Palpations: Abdomen is soft.     Tenderness: There is no abdominal tenderness. There is no right CVA tenderness or left CVA tenderness.  Skin:    General: Skin is warm and dry.  Neurological:     Mental Status: She is alert.      ED Treatments / Results  Labs (all labs ordered are listed, but only abnormal results are displayed) Labs Reviewed  COMPREHENSIVE METABOLIC PANEL - Abnormal; Notable for the following components:      Result Value   Glucose, Bld 119 (*)    Total Bilirubin 1.7 (*)    GFR calc non Af Amer 58 (*)    All other components within normal limits  URINALYSIS, ROUTINE W REFLEX MICROSCOPIC - Abnormal; Notable for the following components:   Color, Urine AMBER (*)    APPearance HAZY (*)    Hgb urine dipstick SMALL (*)    Ketones, ur 20 (*)    Protein, ur >=300 (*)    Nitrite POSITIVE (*)    Bacteria, UA RARE (*)    All other components within normal limits  URINE CULTURE  LIPASE, BLOOD  CBC    EKG None  Radiology No results found.  Procedures Procedures (including critical care time)  Medications Ordered in ED Medications  prochlorperazine (COMPAZINE) injection 10 mg (has no administration in time range)  ondansetron (ZOFRAN-ODT) disintegrating tablet 4 mg (4 mg  Oral Given 01/15/19 1451)  ondansetron (ZOFRAN-ODT) disintegrating tablet 4 mg (4 mg Oral Given 01/15/19 1629)     Initial Impression / Assessment and Plan / ED Course  I have reviewed the triage vital signs and the nursing notes.  Pertinent labs & imaging results that were available during my care of the patient were reviewed by me and considered in my medical decision making (see chart for details).     LAKERIA STARKMAN is  an 85 year old female with history of IBS, depression who presents to the ED with bladder discomfort, nausea.  Patient does not have any abdominal pain on exam, says she has pressure down low.  No flank pain.  Urinalysis positive for infection.  No other significant electrolyte abnormality, kidney injury, leukocytosis.  No fever.  Overall uncomplicated cystitis.  Will treat with Keflex.  Patient had been on Macrobid but I believe she would do better with Keflex.  She has had nausea that improved with Zofran.  Was given a shot IM Compazine.  Is able to tolerate p.o.  Given prescription for Zofran.  Overall no concern for sepsis.  Given return precautions.  No concern for dehydration at this time.  Recommend liquid diet until she feels more comfortable.  Denied any vaginal symptoms.  Discharged from the ED in good condition.  Understands return precautions and recommend follow-up with primary care doctor.  This chart was dictated using voice recognition software.  Despite best efforts to proofread,  errors can occur which can change the documentation meaning.    Final Clinical Impressions(s) / ED Diagnoses   Final diagnoses:  Lower urinary tract infectious disease    ED Discharge Orders         Ordered    cephALEXin (KEFLEX) 500 MG capsule  2 times daily     01/15/19 1647    ondansetron (ZOFRAN-ODT) 4 MG disintegrating tablet  Every 8 hours PRN,   Status:  Discontinued     01/15/19 1647    ondansetron (ZOFRAN-ODT) 4 MG disintegrating tablet  Every 8 hours PRN     01/15/19 [place], Aliea Bobe, DO 01/15/19 1658

## 2019-01-15 NOTE — Telephone Encounter (Signed)
Patient informed, Rx sent.  

## 2019-01-15 NOTE — Telephone Encounter (Signed)
Pt called - she went to her GYN office for a UTI and is now extremely nauseous.  She claims she told them that Zofran doesn't work for her and Zofran was still called in for her.  She felt like she wasn't being listened to.  She wanted to be seen in our office or another [facility] so she could get something to help settle her stomach. Our office has no availability and other offices were hesitant to schedule because care had already been started for this issue at her GYN office. I explained this to her and she understood.  I told her that she could call her GYN and see if they could call in something else for nausea.  She said they were not answering the phones.  She mentioned going to the ED, but expressed concerns and wanted a recommendation for an urgent care.  Told her [facility] would take great care of her.

## 2019-01-15 NOTE — Telephone Encounter (Signed)
Patient called c/o nausea again with Macrobid 100 mg tablet x 5 days , has Reglan she takes but it is not helping. She wants to finish the medication but the nausea is making it very hard to do, she asked if you would be willing to prescribed medication to help with nausea? Please advise

## 2019-01-16 ENCOUNTER — Encounter (HOSPITAL_COMMUNITY): Payer: Self-pay | Admitting: Emergency Medicine

## 2019-01-16 LAB — URINE CULTURE: Culture: NO GROWTH

## 2019-01-18 NOTE — Telephone Encounter (Signed)
Copied from [place] [id number]. Topic: Appointment Scheduling - Scheduling Inquiry for Clinic >> Jan 18, 2019  1:41 PM Krystal Craig wrote: Reason for CRM: pt called and wanted to sch [facility] ED fu with Dr Juleen China.  Pt was in the ED on 7/31, pt would like be seen today or tomorrow  Best number [phone]

## 2019-01-18 NOTE — Telephone Encounter (Signed)
Spoke to pt told her Aldona Bar can see her tomorrow at 1:00 or 3:20? Pt said 1:00 PM. Appt scheduled.

## 2019-01-18 NOTE — Telephone Encounter (Signed)
Okay Sam.

## 2019-01-18 NOTE — Telephone Encounter (Signed)
Can you call to make app with sam?

## 2019-01-18 NOTE — Telephone Encounter (Signed)
Please advise if patient can be worked in or scheduled with another provider.

## 2019-01-18 NOTE — Telephone Encounter (Signed)
Pt stated her infection is flared up worse than it was Friday. Soonest I could get in with Sam is Thursday 8/6. Nothing is available sooner for hosp f.u. Pt would like a call back asap.

## 2019-01-18 NOTE — Telephone Encounter (Signed)
Ok to see Krystal Craig?

## 2019-01-18 NOTE — Telephone Encounter (Signed)
Ok to see sam?

## 2019-01-19 ENCOUNTER — Encounter: Payer: Self-pay | Admitting: Physician Assistant

## 2019-01-19 ENCOUNTER — Telehealth: Payer: Self-pay | Admitting: Physician Assistant

## 2019-01-19 ENCOUNTER — Other Ambulatory Visit: Payer: Medicare Other

## 2019-01-19 ENCOUNTER — Ambulatory Visit (INDEPENDENT_AMBULATORY_CARE_PROVIDER_SITE_OTHER): Payer: Medicare Other | Admitting: Physician Assistant

## 2019-01-19 ENCOUNTER — Other Ambulatory Visit: Payer: Self-pay

## 2019-01-19 VITALS — BP 150/80 | HR 75 | Temp 98.2°F | Ht 63.5 in | Wt 134.0 lb

## 2019-01-19 DIAGNOSIS — R109 Unspecified abdominal pain: Secondary | ICD-10-CM | POA: Diagnosis not present

## 2019-01-19 DIAGNOSIS — R110 Nausea: Secondary | ICD-10-CM

## 2019-01-19 DIAGNOSIS — F5101 Primary insomnia: Secondary | ICD-10-CM

## 2019-01-19 LAB — CBC WITH DIFFERENTIAL/PLATELET
Basophils Absolute: 0 10*3/uL (ref 0.0–0.1)
Basophils Relative: 0.6 % (ref 0.0–3.0)
Eosinophils Absolute: 0.1 10*3/uL (ref 0.0–0.7)
Eosinophils Relative: 2.6 % (ref 0.0–5.0)
HCT: 32.6 % — ABNORMAL LOW (ref 36.0–46.0)
Hemoglobin: 11 g/dL — ABNORMAL LOW (ref 12.0–15.0)
Lymphocytes Relative: 16.1 % (ref 12.0–46.0)
Lymphs Abs: 0.8 10*3/uL (ref 0.7–4.0)
MCHC: 33.8 g/dL (ref 30.0–36.0)
MCV: 88.8 fl (ref 78.0–100.0)
Monocytes Absolute: 0.5 10*3/uL (ref 0.1–1.0)
Monocytes Relative: 9.2 % (ref 3.0–12.0)
Neutro Abs: 3.7 10*3/uL (ref 1.4–7.7)
Neutrophils Relative %: 71.5 % (ref 43.0–77.0)
Platelets: 289 10*3/uL (ref 150.0–400.0)
RBC: 3.67 Mil/uL — ABNORMAL LOW (ref 3.87–5.11)
RDW: 13.6 % (ref 11.5–15.5)
WBC: 5.2 10*3/uL (ref 4.0–10.5)

## 2019-01-19 LAB — BASIC METABOLIC PANEL
BUN: 13 mg/dL (ref 6–23)
CO2: 28 mEq/L (ref 19–32)
Calcium: 9.3 mg/dL (ref 8.4–10.5)
Chloride: 101 mEq/L (ref 96–112)
Creatinine, Ser: 0.89 mg/dL (ref 0.40–1.20)
GFR: 60.46 mL/min (ref >60.00)
Glucose, Bld: 103 mg/dL — ABNORMAL HIGH (ref 70–99)
Potassium: 4 mEq/L (ref 3.5–5.1)
Sodium: 136 mEq/L (ref 135–145)

## 2019-01-19 MED ORDER — ZOLPIDEM TARTRATE 10 MG PO TABS: 10.0000 mg | ORAL_TABLET | Freq: Every evening | ORAL | 0 refills | Status: DC | PRN

## 2019-01-19 NOTE — Telephone Encounter (Signed)
Please call patient about CT scan.  She needs to go to [facility] today to pick up the oral contrast. They will give her further instructions. This scan is not today but she needs to pick up the oral contrast today.  Her CT scan will be TOMORROW at 1:30. She will need to arrive tomorrow until 1:10.  [name] PA-C

## 2019-01-19 NOTE — Telephone Encounter (Signed)
Spoke to pt, told her Aldona Bar said She needs to go to [facility] today to pick up the oral contrast. They will give you further instructions. This scan is not being today but you need to pick up the oral contrast today. Pt verbalized understanding. CT scan will be TOMORROW at 1:30. You will need to arrive tomorrow until 1:10. Pt verbalized understanding.

## 2019-01-19 NOTE — Progress Notes (Signed)
Krystal Craig is a 85 y.o. female here for a follow up of a pre-existing problem.  History of Present Illness:   Chief Complaint  Patient presents with  . Left flank pain  . Nausea    HPI   Left Flank pain Patient went to the urgent care on 01/15/19 for UTI and nausea. She was then seen in the ER because she didn't feel like her symptoms were controlled after being seen in the Urgent Care. ER work-up revealed: GFR 58, normal CBC, UA with bacteria and blood. Was given compazine and keflex.   She has one dose of keflex left to take. Her urine culture from the ER was negative. Since leaving the ER she has had continued nausea, and since yesterday she has had left flank pain that radiates across lower back.   Long standing history of IBS, nausea. Denies any hx of renal stones.  Denies: fevers, chills, SOB, CP  Past Medical History:  Diagnosis Date  . Allergic rhinitis   . Chest pain with normal stress test, 2013   . Chronic kidney disease, stage III (moderate) (HCC)   . Complication of anesthesia   . Diaphragmatic hernia 06/26/2007  . Diverticulosis   . Hiatal hernia   . History of bronchitis   . IBS (irritable bowel syndrome)   . Impacted cerumen of both ears   . Insomnia   . Lumbar back pain   . Major depression in partial remission ([facility])   . Malignant neoplasm of bronchus and lung, 2006   . Osteoarthritis   . Osteopenia   . PONV (postoperative nausea and vomiting)   . Pre-diabetes   . Psoriasis   . Pure hypercholesterolemia   . Vitamin D deficiency      Social History   Socioeconomic History  . Marital status: Married    Spouse name: Not on file  . Number of children: Not on file  . Years of education: Not on file  . Highest education level: Not on file  Occupational History  . Not on file  Social Needs  . Financial resource strain: Not on file  . Food insecurity    Worry: Not on file    Inability: Not on file  . Transportation needs    Medical: Not on  file    Non-medical: Not on file  Tobacco Use  . Smoking status: Former Smoker    Packs/day: 1.00    Years: 30.00    Pack years: 30.00    Types: Cigarettes    Quit date: 06/17/1984    Years since quitting: 34.6  . Smokeless tobacco: Never Used  Substance and Sexual Activity  . Alcohol use: No    Alcohol/week: 0.0 standard drinks  . Drug use: No  . Sexual activity: Not Currently    Birth control/protection: Post-menopausal    Comment: 1st intercourse 36 yo-2 partners  Lifestyle  . Physical activity    Days per week: Not on file    Minutes per session: Not on file  . Stress: Not on file  Relationships  . Social Herbalist on phone: Not on file    Gets together: Not on file    Attends religious service: Not on file    Active member of club or organization: Not on file    Attends meetings of clubs or organizations: Not on file    Relationship status: Not on file  . Intimate partner violence    Fear of current or ex  partner: Not on file    Emotionally abused: Not on file    Physically abused: Not on file    Forced sexual activity: Not on file  Other Topics Concern  . Not on file  Social History Narrative   Married (2nd marriage; 1st husband died at age 47) married 20 29 years in 05/2014. 2 sons from first marriage, 3 from 2nd (step). 15 grandchildren. 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 Hobbies: reading, golf, family time.    Past Surgical History:  Procedure Laterality Date  . ABDOMINAL HYSTERECTOMY  1969  . APPENDECTOMY    . bilateral shouler operations for impingement syndromes Bilateral 2001  . BUNIONECTOMY Left   . CATARACT EXTRACTION, BILATERAL Bilateral 2012  . CHOLECYSTECTOMY  1976  . LAPAROSCOPIC LYSIS INTESTINAL ADHESIONS    . left lower lobectomy  11/2004   via VATS and mini-thoractomy for 1.5cm adenoca of lung by Dr. Arlyce Dice  . LUMBAR LAMINECTOMY/DECOMPRESSION MICRODISCECTOMY Left 01/05/2015   Procedure: L3-4 DECOMPRESSION MICRODISCECTOMY ON LEFT;   Surgeon: Melina Schools, MD;  Location: [facility];  Service: Orthopedics;  Laterality: Left;  left Lumbar 3-4  . turmor ear Right 09/2013  . urologic sugery with cystocele/rectocele repairs and sling  03/2007   Dr. Terance Hart    Family History  Problem Relation Age of Onset  . Breast cancer Sister 65  . Dementia Mother   . Heart attack Mother   . CVA Father   . Stroke Father   . Colon cancer Neg Hx   . Esophageal cancer Neg Hx   . Pancreatic cancer Neg Hx   . Rectal cancer Neg Hx   . Stomach cancer Neg Hx     Allergies  Allergen Reactions  . Ciprocin-Fluocin-Procin [Fluocinolone] Other (See Comments)    Leg and arm numbness  . Codeine     Chest pain/tightness  . Wellbutrin [Bupropion] Rash    Current Medications:   Current Outpatient Medications:  .  acetaminophen (TYLENOL ARTHRITIS PAIN) 650 MG CR tablet, Take 650 mg by mouth every 8 (eight) hours as needed for pain., Disp: , Rfl:  .  aspirin 81 MG tablet, Take 81 mg by mouth daily., Disp: , Rfl:  .  Calcium Carbonate-Vitamin D (CALCIUM-VITAMIN D) 500-200 MG-UNIT tablet, Take 1 tablet by mouth daily., Disp: , Rfl:  .  cephALEXin (KEFLEX) 500 MG capsule, Take 1 capsule (500 mg total) by mouth 2 (two) times daily for 5 days., Disp: 10 capsule, Rfl: 0 .  clonazePAM (KLONOPIN) 0.5 MG tablet, Take 1 tablet (0.5 mg total) by mouth 2 (two) times daily as needed. for anxiety, Disp: 60 tablet, Rfl: 2 .  conjugated estrogens (PREMARIN) vaginal cream, Place 1 Applicatorful vaginally as needed., Disp: , Rfl:  .  escitalopram (LEXAPRO) 10 MG tablet, Take 1 tablet (10 mg total) by mouth daily., Disp: 90 tablet, Rfl: 1 .  Multiple Vitamins-Minerals (MULTIVITAMIN WITH MINERALS) tablet, Take 1 tablet by mouth daily., Disp: , Rfl:  .  ondansetron (ZOFRAN-ODT) 4 MG disintegrating tablet, Take 1 tablet (4 mg total) by mouth every 8 (eight) hours as needed for up to 15 doses for nausea or vomiting., Disp: 15 tablet, Rfl: 0 .  simvastatin (ZOCOR) 40 MG  tablet, Take 1 tablet (40 mg total) by mouth at bedtime., Disp: 90 tablet, Rfl: 0 .  traMADol (ULTRAM) 50 MG tablet, Take 1 tablet (50 mg total) by mouth every 6 (six) hours as needed., Disp: 90 tablet, Rfl: 0 .  zolpidem (AMBIEN) 10 MG tablet, Take  1 tablet (10 mg total) by mouth at bedtime as needed. for sleep, Disp: 30 tablet, Rfl: 0   Review of Systems:   ROS Negative unless otherwise specified per HPI.  Vitals:   Vitals:   01/19/19 1307  BP: (!) 150/80  Pulse: 75  Temp: 98.2 F (36.8 C)  TempSrc: Temporal  SpO2: 95%  Weight: 134 lb (60.8 kg)  Height: 5' 3.5" (1.613 m)     Body mass index is 23.36 kg/m.  Physical Exam:   Physical Exam Vitals signs and nursing note reviewed.  Constitutional:      General: She is not in acute distress.    Appearance: She is well-developed. She is not ill-appearing or toxic-appearing.  HENT:     Head: Normocephalic and atraumatic.  Cardiovascular:     Rate and Rhythm: Normal rate and regular rhythm.     Heart sounds: Normal heart sounds.  Pulmonary:     Effort: Pulmonary effort is normal. No accessory muscle usage or respiratory distress.     Breath sounds: Normal breath sounds.  Abdominal:     General: Abdomen is flat. Bowel sounds are normal.     Palpations: Abdomen is soft.     Tenderness: There is generalized abdominal tenderness. There is no right CVA tenderness, left CVA tenderness, guarding or rebound.  Skin:    General: Skin is warm and dry.  Neurological:     Mental Status: She is alert.  Psychiatric:        Speech: Speech normal.        Behavior: Behavior is cooperative.      Assessment and Plan:   Qu[place]aly was seen today for left flank pain and nausea.  Diagnoses and all orders for this visit:  Nausea -     CT Abdomen Pelvis W Contrast; Future -     CBC with Differential/Platelet -     Basic metabolic panel  Left flank pain -     CT Abdomen Pelvis W Contrast; Future -     CBC with Differential/Platelet -      Basic metabolic panel   Ongoing issues with nausea, now with new L flank pain. Will repeat CBC and BMP. Finish out [employer]. Stat CT scan of abd/pel to see if we can get anymore information. Interventions based on lab results. Was recommended to go to the ER in the interim if pain changes in anyway.  . Reviewed expectations re: course of current medical issues. . Discussed self-management of symptoms. . Outlined signs and symptoms indicating need for more acute intervention. . Patient verbalized understanding and all questions were answered. . See orders for this visit as documented in the electronic medical record. . Patient received an After-Visit Summary.  CMA or LPN served as scribe during this visit. History, Physical, and Plan performed by medical provider. The above documentation has been reviewed and is accurate and complete.  [name], PA-C

## 2019-01-19 NOTE — Telephone Encounter (Signed)
App has been made with sam before message sent

## 2019-01-19 NOTE — Telephone Encounter (Signed)
Ok to fill Ambien. I have pulled up for you.

## 2019-01-19 NOTE — Patient Instructions (Addendum)
It was great to see you!  We will contact you regarding your lab results.  Please have the CT scan done as we have recommended. I will be in touch with the results.  Take care,  [name] PA-C

## 2019-01-20 ENCOUNTER — Ambulatory Visit: Payer: Self-pay

## 2019-01-20 ENCOUNTER — Ambulatory Visit
Admission: RE | Admit: 2019-01-20 | Discharge: 2019-01-20 | Disposition: A | Discharge status: Home / Self Care | Payer: Medicare Other | Source: Ambulatory Visit | Attending: Physician Assistant | Admitting: Physician Assistant

## 2019-01-20 ENCOUNTER — Other Ambulatory Visit: Payer: Self-pay | Admitting: Physician Assistant

## 2019-01-20 DIAGNOSIS — K760 Fatty (change of) liver, not elsewhere classified: Secondary | ICD-10-CM | POA: Diagnosis not present

## 2019-01-20 DIAGNOSIS — R109 Unspecified abdominal pain: Secondary | ICD-10-CM

## 2019-01-20 DIAGNOSIS — K5730 Diverticulosis of large intestine without perforation or abscess without bleeding: Secondary | ICD-10-CM | POA: Diagnosis not present

## 2019-01-20 DIAGNOSIS — R710 Precipitous drop in hematocrit: Secondary | ICD-10-CM

## 2019-01-20 DIAGNOSIS — R110 Nausea: Secondary | ICD-10-CM

## 2019-01-20 MED ORDER — IOPAMIDOL (ISOVUE-300) INJECTION 61%
100.0000 mL | Freq: Once | INTRAVENOUS | Status: AC | PRN
  Administered 2019-01-20: 100 mL via INTRAVENOUS

## 2019-01-20 NOTE — Telephone Encounter (Signed)
Provided result note to patient along with  instructions for Patient from Dr.  Juleen China.  Patient voices understanding.    Answer Assessment - Initial Assessment Questions 1. REASON FOR CALL or QUESTION: "What is your reason for calling today?" or "How can I best help you?" or "What question do you have that I can help answer?"     Lab results  2. CALLER: Document the source of call. (e.g., laboratory, patient).     Patients  Protocols used: PCP CALL - NO TRIAGE-A-AH

## 2019-01-21 ENCOUNTER — Inpatient Hospital Stay: Payer: Medicare Other | Admitting: Physician Assistant

## 2019-01-21 ENCOUNTER — Telehealth: Payer: Self-pay | Admitting: Family Medicine

## 2019-01-21 NOTE — Telephone Encounter (Signed)
See below

## 2019-01-21 NOTE — Telephone Encounter (Signed)
Pt stated she is still having nausea. Requesting rx for cephALEXin (KEFLEX) 500 MG capsule  Please advise.

## 2019-01-21 NOTE — Telephone Encounter (Signed)
Pt was seen by Aldona Bar. Patient came in office today and received supplies for stool test and appointment in two weeks for labs.

## 2019-01-22 MED ORDER — CEPHALEXIN 500 MG PO CAPS: 500.0000 mg | ORAL_CAPSULE | Freq: Three times a day (TID) | ORAL | 0 refills | Status: DC

## 2019-01-22 NOTE — Telephone Encounter (Signed)
Think patient was seen by you last for this.

## 2019-01-22 NOTE — Telephone Encounter (Signed)
May refill keflex 500 mg TID x 3 days.  If still having issues Monday needs appointment  Worsening over weekend needs urgent care.

## 2019-01-22 NOTE — Telephone Encounter (Signed)
Spoke to pt , told her Krystal Craig is going to give you 3 more days of Keflex. Rx sent to pharmacy. Pt verbalized understanding. Told pt if not better by Monday need to call and schedule an appt, any worsening symptoms over the weekend need to go to Urgent Care. Pt verbalized understanding.

## 2019-01-25 ENCOUNTER — Other Ambulatory Visit: Payer: Self-pay

## 2019-01-25 ENCOUNTER — Telehealth: Payer: Self-pay | Admitting: Gastroenterology

## 2019-01-25 ENCOUNTER — Ambulatory Visit (INDEPENDENT_AMBULATORY_CARE_PROVIDER_SITE_OTHER): Payer: Medicare Other | Admitting: Physician Assistant

## 2019-01-25 ENCOUNTER — Telehealth: Payer: Self-pay | Admitting: Family Medicine

## 2019-01-25 ENCOUNTER — Encounter: Payer: Self-pay | Admitting: Physician Assistant

## 2019-01-25 VITALS — BP 158/90 | HR 77 | Temp 98.2°F | Ht 63.5 in | Wt 132.4 lb

## 2019-01-25 DIAGNOSIS — M549 Dorsalgia, unspecified: Secondary | ICD-10-CM | POA: Diagnosis not present

## 2019-01-25 DIAGNOSIS — Z20822 Contact with and (suspected) exposure to covid-19: Secondary | ICD-10-CM

## 2019-01-25 DIAGNOSIS — R109 Unspecified abdominal pain: Secondary | ICD-10-CM | POA: Diagnosis not present

## 2019-01-25 DIAGNOSIS — R110 Nausea: Secondary | ICD-10-CM | POA: Diagnosis not present

## 2019-01-25 LAB — POCT URINALYSIS DIPSTICK
Bilirubin, UA: NEGATIVE
Blood, UA: NEGATIVE
Glucose, UA: NEGATIVE
Ketones, UA: NEGATIVE
Leukocytes, UA: NEGATIVE
Nitrite, UA: NEGATIVE
Protein, UA: POSITIVE — AB
Spec Grav, UA: 1.01 (ref 1.010–1.025)
Urobilinogen, UA: 0.2 E.U./dL
pH, UA: 8.5 — AB (ref 5.0–8.0)

## 2019-01-25 MED ORDER — PROMETHAZINE HCL 12.5 MG PO TABS: 12.5000 mg | ORAL_TABLET | Freq: Three times a day (TID) | ORAL | 0 refills | Status: DC | PRN

## 2019-01-25 NOTE — Telephone Encounter (Signed)
Called gave app with you today for f/u

## 2019-01-25 NOTE — Patient Instructions (Signed)
It was great to see you!  1. Please call Dr. Lynne Leader office to make a follow-up appointment for your nausea -- [phone]  2. Use the nausea medication as prescribed.  3. Any worsening symptoms, please go to urgent care.  4. I will be in touch with your regarding your culture results.  5. Go get tested for COVID-19 We have put in an order for you to be tested for COVID-19.  You do not need an appointment to go get testing -- please proceed to one of the following drive-up testing locations at your convenience. The line closes at 3:30pm daily. Test results may take up to a week to return.  [facility] Location: [address] [employer] (old [facility]) Hours: 8a-3:45p, M-F  [facility] Location: [address], [place], [place] [address] [facility]) Hours: 8a-3:45p, M-F  [facility] Location: [facility] (across from [facility]/[place]) Hours: 8a-3:45p, M-F      Take care,  [name] PA-C

## 2019-01-25 NOTE — Telephone Encounter (Signed)
Pt states that nausea has been reoccurring and would like some advise.

## 2019-01-25 NOTE — Telephone Encounter (Signed)
°  Relation to pt: self  Call back number: [phone]  Pharmacy:  [place], [place] [phone] (Phone) [phone] (Fax)    Reason for call:  Patient requesting a cephALEXin (KEFLEX) 500 MG capsule, informed patient please allow 48 to 72 hour turn around time, patient stated she's completely out and feeling nausea, please advise

## 2019-01-25 NOTE — Progress Notes (Signed)
Krystal Craig is a 85 y.o. female is here to follow up on Nausea.  I acted as a Education administrator for [employer], PA-C Krystal Pickler, LPN  History of Present Illness:   Chief Complaint  Patient presents with  . Nausea    HPI   Nausea Last seen by me on 01/19/19. Was finishing up a course of keflex and CT scan of abd/pelvis performed. Pt still c/o nausea, loose stools today x 2 and feeling fatigue. Denies fever, but having chills and has a dull  headache now. Pt has not taken any medication for nausea due to ran out of Zofran. Pt completed Keflex and denies urinary symptoms.   She makes herself eat and endorses good appetite.  She has been seen multiple times over the past several months for ongoing nausea. Had appointment with Dr. Fuller Plan on 07/24/18 for nausea. She was told to follow-up with PCP if no improvement.  CT abd/pelvis on 01/20/19 IMPRESSION: 1. No acute abnormality. 2. Stable diffuse hepatic steatosis. 3. Colonic diverticulosis. 4. Mild changes of COPD and chronic bronchitis.   Health Maintenance Due  Topic Date Due  . INFLUENZA VACCINE  01/16/2019    Past Medical History:  Diagnosis Date  . Allergic rhinitis   . Chest pain with normal stress test, 2013   . Chronic kidney disease, stage III (moderate) (HCC)   . Complication of anesthesia   . Diaphragmatic hernia 06/26/2007  . Diverticulosis   . Hiatal hernia   . History of bronchitis   . IBS (irritable bowel syndrome)   . Impacted cerumen of both ears   . Insomnia   . Lumbar back pain   . Major depression in partial remission ([place])   . Malignant neoplasm of bronchus and lung, 2006   . Osteoarthritis   . Osteopenia   . PONV (postoperative nausea and vomiting)   . Pre-diabetes   . Psoriasis   . Pure hypercholesterolemia   . Vitamin D deficiency      Social History   Socioeconomic History  . Marital status: Married    Spouse name: Not on file  . Number of children: Not on file  . Years of education:  Not on file  . Highest education level: Not on file  Occupational History  . Not on file  Social Needs  . Financial resource strain: Not on file  . Food insecurity    Worry: Not on file    Inability: Not on file  . Transportation needs    Medical: Not on file    Non-medical: Not on file  Tobacco Use  . Smoking status: Former Smoker    Packs/day: 1.00    Years: 30.00    Pack years: 30.00    Types: Cigarettes    Quit date: 06/17/1984    Years since quitting: 34.6  . Smokeless tobacco: Never Used  Substance and Sexual Activity  . Alcohol use: No    Alcohol/week: 0.0 standard drinks  . Drug use: No  . Sexual activity: Not Currently    Birth control/protection: Post-menopausal    Comment: 1st intercourse 13 yo-2 partners  Lifestyle  . Physical activity    Days per week: Not on file    Minutes per session: Not on file  . Stress: Not on file  Relationships  . Social Herbalist on phone: Not on file    Gets together: Not on file    Attends religious service: Not on file  Active member of club or organization: Not on file    Attends meetings of clubs or organizations: Not on file    Relationship status: Not on file  . Intimate partner violence    Fear of current or ex partner: Not on file    Emotionally abused: Not on file    Physically abused: Not on file    Forced sexual activity: Not on file  Other Topics Concern  . Not on file  Social History Narrative   Married (2nd marriage; 1st husband died at age 71) married 15 29 years in 05/2014. 2 sons from first marriage, 3 from 2nd (step). 15 grandchildren. 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 Hobbies: reading, golf, family time.    Past Surgical History:  Procedure Laterality Date  . ABDOMINAL HYSTERECTOMY  1969  . APPENDECTOMY    . bilateral shouler operations for impingement syndromes Bilateral 2001  . BUNIONECTOMY Left   . CATARACT EXTRACTION, BILATERAL Bilateral 2012  . CHOLECYSTECTOMY  1976  .  LAPAROSCOPIC LYSIS INTESTINAL ADHESIONS    . left lower lobectomy  11/2004   via VATS and mini-thoractomy for 1.5cm adenoca of lung by Dr. Arlyce Dice  . LUMBAR LAMINECTOMY/DECOMPRESSION MICRODISCECTOMY Left 01/05/2015   Procedure: L3-4 DECOMPRESSION MICRODISCECTOMY ON LEFT;  Surgeon: Melina Schools, MD;  Location: [place];  Service: Orthopedics;  Laterality: Left;  left Lumbar 3-4  . turmor ear Right 09/2013  . urologic sugery with cystocele/rectocele repairs and sling  03/2007   Dr. Terance Hart    Family History  Problem Relation Age of Onset  . Breast cancer Sister 23  . Dementia Mother   . Heart attack Mother   . CVA Father   . Stroke Father   . Colon cancer Neg Hx   . Esophageal cancer Neg Hx   . Pancreatic cancer Neg Hx   . Rectal cancer Neg Hx   . Stomach cancer Neg Hx     PMHx, SurgHx, SocialHx, FamHx, Medications, and Allergies were reviewed in the Visit Navigator and updated as appropriate.   Patient Active Problem List   Diagnosis Date Noted  . PND (post-nasal drip) 07/01/2018  . History of cholecystectomy 05/31/2018  . Age-related osteoporosis without current pathological fracture, does not want treatment 05/31/2018  . Caregiver burden 05/31/2018  . Insulin resistance 05/31/2018  . Chronic pain syndrome 05/31/2018  . Abnormal findings on diagnostic imaging of other abdominal regions, liver and pancreas 05/31/2018  . Aortic atherosclerosis ([place]) 05/26/2018  . Degenerative arthritis of lumbar spine 05/26/2018  . Situational depression 12/22/2017  . Diverticulosis 11/11/2016  . Hx of cancer of lung, s/p left lobectomy 2006 11/11/2016  . Situational anxiety 11/06/2016  . Primary osteoarthritis of both first carpometacarpal joints 12/28/2015  . Microscopic hematuria, with previous evaluation by Urology and determined to be benign 09/12/2014  . Psoriasis, followed by [facility] 02/28/2014  . CKD (chronic kidney disease), stage III ([place]) 02/28/2014  . Thoracic lymphadenopathy  09/25/2011  . Hyperlipidemia, Rx Zocor 09/25/2011  . Insomnia, takes 5 mg Ambien prn 08/03/2008  . Allergic rhinitis 06/26/2007  . GERD 06/26/2007  . Irritable bowel syndrome 06/26/2007    Social History   Tobacco Use  . Smoking status: Former Smoker    Packs/day: 1.00    Years: 30.00    Pack years: 30.00    Types: Cigarettes    Quit date: 06/17/1984    Years since quitting: 34.6  . Smokeless tobacco: Never Used  Substance Use Topics  . Alcohol use: No  Alcohol/week: 0.0 standard drinks  . Drug use: No    Current Medications and Allergies:    Current Outpatient Medications:  .  acetaminophen (TYLENOL ARTHRITIS PAIN) 650 MG CR tablet, Take 650 mg by mouth every 8 (eight) hours as needed for pain., Disp: , Rfl:  .  aspirin 81 MG tablet, Take 81 mg by mouth daily., Disp: , Rfl:  .  Calcium Carbonate-Vitamin D (CALCIUM-VITAMIN D) 500-200 MG-UNIT tablet, Take 1 tablet by mouth daily., Disp: , Rfl:  .  clonazePAM (KLONOPIN) 0.5 MG tablet, Take 1 tablet (0.5 mg total) by mouth 2 (two) times daily as needed. for anxiety, Disp: 60 tablet, Rfl: 2 .  conjugated estrogens (PREMARIN) vaginal cream, Place 1 Applicatorful vaginally as needed., Disp: , Rfl:  .  escitalopram (LEXAPRO) 10 MG tablet, Take 1 tablet (10 mg total) by mouth daily., Disp: 90 tablet, Rfl: 1 .  Multiple Vitamins-Minerals (MULTIVITAMIN WITH MINERALS) tablet, Take 1 tablet by mouth daily., Disp: , Rfl:  .  simvastatin (ZOCOR) 40 MG tablet, Take 1 tablet (40 mg total) by mouth at bedtime., Disp: 90 tablet, Rfl: 0 .  traMADol (ULTRAM) 50 MG tablet, Take 1 tablet (50 mg total) by mouth every 6 (six) hours as needed., Disp: 90 tablet, Rfl: 0 .  zolpidem (AMBIEN) 10 MG tablet, Take 1 tablet (10 mg total) by mouth at bedtime as needed. for sleep, Disp: 30 tablet, Rfl: 0 .  ondansetron (ZOFRAN-ODT) 4 MG disintegrating tablet, Take 1 tablet (4 mg total) by mouth every 8 (eight) hours as needed for up to 15 doses for nausea or  vomiting. (Patient not taking: Reported on 01/25/2019), Disp: 15 tablet, Rfl: 0 .  promethazine (PHENERGAN) 12.5 MG tablet, Take 1 tablet (12.5 mg total) by mouth every 8 (eight) hours as needed for nausea or vomiting., Disp: 20 tablet, Rfl: 0   Allergies  Allergen Reactions  . Ciprocin-Fluocin-Procin [Fluocinolone] Other (See Comments)    Leg and arm numbness  . Codeine     Chest pain/tightness  . Wellbutrin [Bupropion] Rash    Review of Systems   ROS  Vitals:   Vitals:   01/25/19 1333  BP: (!) 158/90  Pulse: 77  Temp: 98.2 F (36.8 C)  TempSrc: Temporal  SpO2: 96%  Weight: 132 lb 6.1 oz (60 kg)  Height: 5' 3.5" (1.613 m)     Body mass index is 23.08 kg/m.   Physical Exam:    Physical Exam Vitals signs and nursing note reviewed.  Constitutional:      General: She is not in acute distress.    Appearance: She is well-developed. She is not ill-appearing or toxic-appearing.  HENT:     Head: Normocephalic and atraumatic.  Eyes:     Conjunctiva/sclera: Conjunctivae normal.  Neck:     Musculoskeletal: Normal range of motion and neck supple.  Cardiovascular:     Rate and Rhythm: Normal rate and regular rhythm.     Pulses: Normal pulses.     Heart sounds: Normal heart sounds, S1 normal and S2 normal.     Comments: No LE edema Pulmonary:     Effort: Pulmonary effort is normal.     Breath sounds: Normal breath sounds.  Abdominal:     General: Abdomen is flat.     Palpations: Abdomen is soft.     Tenderness: There is no abdominal tenderness.  Musculoskeletal: Normal range of motion.  Skin:    General: Skin is warm and dry.  Neurological:  Mental Status: She is alert and oriented to person, place, and time.     GCS: GCS eye subscore is 4. GCS verbal subscore is 5. GCS motor subscore is 6.  Psychiatric:        Speech: Speech normal.        Behavior: Behavior normal. Behavior is cooperative.        Thought Content: Thought content normal.        Judgment:  Judgment normal.      Assessment and Plan:    Nakyiah was seen today for nausea.  Diagnoses and all orders for this visit:  Nausea -     POCT urinalysis dipstick -     Urine Culture -     Cancel: Novel Coronavirus, NAA ([facility])  Other orders -     promethazine (PHENERGAN) 12.5 MG tablet; Take 1 tablet (12.5 mg total) by mouth every 8 (eight) hours as needed for nausea or vomiting.    Denies anxiety. GAD-7 was 0 today. Repeat UA without signs of infection, but will send for culture. She is requesting more phenergan, she is aware that this may make her drowsy. I am also going to send her for COVID-19 testing just a precaution given her worsening nausea acutely. I also provided her with Dr. Lynne Leader office number to call and self schedule an appointment to follow-up on her nausea.  . Reviewed expectations re: course of current medical issues. . Discussed self-management of symptoms. . Outlined signs and symptoms indicating need for more acute intervention. . Patient verbalized understanding and all questions were answered. . See orders for this visit as documented in the electronic medical record. . Patient received an After Visit Summary.  CMA or LPN served as scribe during this visit. History, Physical, and Plan performed by medical provider. The above documentation has been reviewed and is accurate and complete.   [name], PA-C [place] [place] 01/25/2019  Follow-up: No follow-ups on file.

## 2019-01-25 NOTE — Telephone Encounter (Signed)
Spoke to patient who reports unrelenting nausea, contacted PCP and retrieved a Phenergan prescription, wanted to be seen in the office, scheduled appt with AP (8/28 at 10:30 am), Dr. Fuller Plan had no availability.

## 2019-01-26 LAB — NOVEL CORONAVIRUS, NAA: SARS-CoV-2, NAA: NOT DETECTED

## 2019-01-27 ENCOUNTER — Telehealth: Payer: Self-pay | Admitting: Family Medicine

## 2019-01-27 LAB — URINE CULTURE
MICRO NUMBER:: 754110
SPECIMEN QUALITY:: ADEQUATE

## 2019-01-27 NOTE — Telephone Encounter (Signed)
Pt says that she has been taking promethazine (PHENERGAN) 12.5 MG tablet for nausea. Pt says that she stopped for a few days because nausea went away and just started back. Pt says that the medication is no longer helping her.   Pt would like to be advised further on what she should do?  CB: [phone]

## 2019-01-27 NOTE — Telephone Encounter (Signed)
See below

## 2019-01-27 NOTE — Telephone Encounter (Signed)
Patient has been seen several times in past for symptoms. Looks like has app with GI but not until end of Aug. Not sure how to move forward. Do you want me to call GI about seeing sooner or get her back in to see someone in office?

## 2019-01-28 ENCOUNTER — Ambulatory Visit (INDEPENDENT_AMBULATORY_CARE_PROVIDER_SITE_OTHER): Payer: Medicare Other | Admitting: Family Medicine

## 2019-01-28 ENCOUNTER — Other Ambulatory Visit: Payer: Self-pay

## 2019-01-28 ENCOUNTER — Encounter: Payer: Self-pay | Admitting: Family Medicine

## 2019-01-28 DIAGNOSIS — R110 Nausea: Secondary | ICD-10-CM

## 2019-01-28 MED ORDER — SUCRALFATE 1 G PO TABS: 1.0000 g | ORAL_TABLET | Freq: Two times a day (BID) | ORAL | 0 refills | Status: DC | PRN

## 2019-01-28 NOTE — Patient Instructions (Signed)
-  take the antacid daily for 1-2 weeks to see if this helps  -can use the Carafate as needed per instructions to see if this is helpful  -see the gastroenterologist as planned  I hope you are feeling better soon! Seek care promptly if your symptoms worsen or new concerns arise.

## 2019-01-28 NOTE — Progress Notes (Signed)
Virtual Visit via Telephone Note  I connected with Krystal Craig on 01/28/19 at 11:20 AM EDT by telephone and verified that I am speaking with the correct person using two identifiers.   I discussed the limitations, risks, security and privacy concerns of performing an evaluation and management service by telephone and the availability of in person appointments. I also discussed with the patient that there may be a patient responsible charge related to this service. The patient expressed understanding and agreed to proceed.  Location patient: home Location provider: work or home office Participants present for the call: patient, provider Patient did not have a visit in the prior 7 days to address this/these issue(s).   History of Present Illness:  Acute visit for Chronic Nausea: -reports has been fighting this for many weeks now -per records has been ongoing for months, she admits has a history of this in the past but is lasting much longer this time -she has seen her PCP several times, has appt scheduled with GI this month and has been to the ER for this as well -could not get a hold of GI or PCP today  -she has had x-rays and CT scan -she has tried antibiotic (keflex), phenergan and zofran and they do not help -nausea is there every day, all day and all night -no vomiting, hematochezia, melena, fevers -she is not sure if she has lost weight -had a little diarrhea today (did not have diarrhea before) -no pain in the stomach, just pure nausea -no dizziness or vertigo -on review of chart had labs including CMP, CBC, urine studies, COVID testing recently, has mild anemia recently and iron studies and fobt are pending    Observations/Objective: Patient sounds cheerful and well on the phone. I do not appreciate any SOB. Speech and thought processing are grossly intact. Patient reported vitals:  Assessment and Plan:  Nausea, chronic   -we discussed possible serious and likely  etiologies, workup and treatment, treatment risks and return precautions. Needs endoscopy and has appt with GI - query gastritis, H. Pylori, other. If no over GI related cause found per further eval needs further eval with PCP for other causes of nausea and tx for possible functional nausea. -after this discussion, [place] opted for trial short course OTC antacid (discussed options and risks Pepcid vs Nexium and she will try), also trail carafate prn for symptoms pending GI eval. Offered further lab work up pending GI eval with H. Pylori antigen, etc - but she opted to hold off until seeing GI. -follow up advised as needed pending GI eval -of course, we advised [name]  to return or notify a doctor immediately if symptoms worsen or persist or new concerns arise.  Follow Up Instructions:  I did not refer this patient for an OV in the next 24 hours for this/these issue(s).  I discussed the assessment and treatment plan with the patient. The patient was provided an opportunity to ask questions and all were answered. The patient agreed with the plan and demonstrated an understanding of the instructions.   I provided 16 minutes of non-face-to-face time during this encounter.   [name], DO   Patient Instructions  -take the antacid daily for 1-2 weeks to see if this helps  -can use the Carafate as needed per instructions to see if this is helpful  -see the gastroenterologist as planned  I hope you are feeling better soon! Seek care promptly if your symptoms worsen or new concerns arise.

## 2019-01-29 ENCOUNTER — Telehealth: Payer: Self-pay | Admitting: Gastroenterology

## 2019-01-29 ENCOUNTER — Other Ambulatory Visit (INDEPENDENT_AMBULATORY_CARE_PROVIDER_SITE_OTHER): Payer: Medicare Other

## 2019-01-29 DIAGNOSIS — R710 Precipitous drop in hematocrit: Secondary | ICD-10-CM

## 2019-01-29 LAB — FECAL OCCULT BLOOD, IMMUNOCHEMICAL: Fecal Occult Bld: NEGATIVE

## 2019-01-29 NOTE — Telephone Encounter (Signed)
Called gi they are sending note to doctor to see if she can be worked in

## 2019-01-29 NOTE — Telephone Encounter (Signed)
Dr. Fuller Plan, no earlier appt with APP.  Please see request from Dr. Juleen China about working or do you have recommendations?

## 2019-01-29 NOTE — Telephone Encounter (Signed)
I cannot get her in any sooner.  Other offices I have called say it will take 3-6 weeks to get an appt.  If she needs to be seen more urgently, then a physician to physician call should be made.

## 2019-01-29 NOTE — Telephone Encounter (Signed)
This is Dr Fuller Plan pt.  She has not been seen by Janett Billow so this should go to [place].

## 2019-01-29 NOTE — Telephone Encounter (Signed)
Pt is currently scheduled 02/12/19 with Alonza Bogus.  Pt's PCP Dr. Juleen China called to request for pt to be worked in sooner.

## 2019-01-29 NOTE — Telephone Encounter (Signed)
Pt is currently scheduled 02/12/19.  Pt's PCP Dr. Juleen China called to request for pt to be worked in sooner.

## 2019-01-29 NOTE — Telephone Encounter (Signed)
See if GI can get her in urgently. If they say again that it is not related to GI then we will go down a different road.

## 2019-01-31 ENCOUNTER — Encounter: Payer: Self-pay | Admitting: Family Medicine

## 2019-01-31 NOTE — Telephone Encounter (Signed)
Begin Nexium 20 mg OTC qam Hold Ambien, Zocor, Tramadol for 5 days and assess repsonse Take Zofan tid (not prn) Schedule direct EGD this week. I have several openings Full liquid diet the day prior to EGD  Keep office appt as scheduled on 8/28

## 2019-02-01 ENCOUNTER — Telehealth: Payer: Self-pay | Admitting: *Deleted

## 2019-02-01 MED ORDER — ONDANSETRON 4 MG PO TBDP: 4.0000 mg | ORAL_TABLET | Freq: Three times a day (TID) | ORAL | 0 refills | Status: AC

## 2019-02-01 NOTE — Telephone Encounter (Signed)
Left message for patient to call back  

## 2019-02-01 NOTE — Telephone Encounter (Signed)
Do ou have time to call and see if was able to work this patient in?

## 2019-02-01 NOTE — Telephone Encounter (Signed)
Called patient to insure she knew to be on full liquids all day on 02/02/2019.Patient aware. Patient requesting telephone Previsit.

## 2019-02-01 NOTE — Telephone Encounter (Signed)
Patient notified of the recommendations and plans.  She will come for the pre-visit tomorrow and EGD on 02/03/19.  Rx refill sent to the pharmacy for zofran.  She verbalized understanding of instructions.

## 2019-02-02 ENCOUNTER — Telehealth: Payer: Self-pay

## 2019-02-02 ENCOUNTER — Ambulatory Visit (AMBULATORY_SURGERY_CENTER): Payer: Self-pay | Admitting: *Deleted

## 2019-02-02 ENCOUNTER — Other Ambulatory Visit: Payer: Self-pay

## 2019-02-02 VITALS — Temp 96.2°F | Ht 63.5 in | Wt 131.6 lb

## 2019-02-02 DIAGNOSIS — R109 Unspecified abdominal pain: Secondary | ICD-10-CM

## 2019-02-02 DIAGNOSIS — Z87898 Personal history of other specified conditions: Secondary | ICD-10-CM

## 2019-02-02 NOTE — Telephone Encounter (Signed)
Covid-19 screening questions   Do you now or have you had a fever in the last 14 days? NO  Do you have any respiratory symptoms of shortness of breath or cough now or in the last 14 days? NO  Do you have any family members or close contacts with diagnosed or suspected Covid-19 in the past 14 days? NO  Have you been tested for Covid-19 and found to be positive? NO     Confirmed with patient

## 2019-02-02 NOTE — Progress Notes (Signed)
No egg or soy allergy known to patient  No issues with past sedation with any surgeries  or procedures, no intubation problems  No diet pills per patient No home 02 use per patient  No blood thinners per patient  Pt denies issues with constipation  No A fib or A flutter  EMMI video sent to pt's e mail  

## 2019-02-03 ENCOUNTER — Encounter: Payer: Self-pay | Admitting: Gastroenterology

## 2019-02-03 ENCOUNTER — Other Ambulatory Visit: Payer: Self-pay

## 2019-02-03 ENCOUNTER — Ambulatory Visit (AMBULATORY_SURGERY_CENTER): Payer: Medicare Other | Admitting: Gastroenterology

## 2019-02-03 VITALS — BP 133/61 | HR 63 | Temp 98.4°F | Resp 15 | Ht 63.0 in | Wt 132.0 lb

## 2019-02-03 DIAGNOSIS — K219 Gastro-esophageal reflux disease without esophagitis: Secondary | ICD-10-CM | POA: Diagnosis not present

## 2019-02-03 DIAGNOSIS — K449 Diaphragmatic hernia without obstruction or gangrene: Secondary | ICD-10-CM | POA: Diagnosis not present

## 2019-02-03 DIAGNOSIS — K2950 Unspecified chronic gastritis without bleeding: Secondary | ICD-10-CM | POA: Diagnosis not present

## 2019-02-03 DIAGNOSIS — K2970 Gastritis, unspecified, without bleeding: Secondary | ICD-10-CM

## 2019-02-03 DIAGNOSIS — K229 Disease of esophagus, unspecified: Secondary | ICD-10-CM

## 2019-02-03 DIAGNOSIS — K2270 Barrett's esophagus without dysplasia: Secondary | ICD-10-CM | POA: Diagnosis not present

## 2019-02-03 DIAGNOSIS — R110 Nausea: Secondary | ICD-10-CM

## 2019-02-03 MED ORDER — SODIUM CHLORIDE 0.9 % IV SOLN: 500.0000 mL | Freq: Once | INTRAVENOUS | Status: DC

## 2019-02-03 NOTE — Progress Notes (Signed)
Report to PACU, RN, vss, BBS= Clear.  

## 2019-02-03 NOTE — Telephone Encounter (Signed)
Left a message for pt to call office back to reschedule lab appointment for Friday.

## 2019-02-03 NOTE — Telephone Encounter (Signed)
See telephone note.

## 2019-02-03 NOTE — Progress Notes (Signed)
Called to room to assist during endoscopic procedure.  Patient ID and intended procedure confirmed with present staff. Received instructions for my participation in the procedure from the performing physician.  

## 2019-02-03 NOTE — Telephone Encounter (Unsigned)
Copied from [place] [id number]. Topic: General - Inquiry >> Feb 03, 2019 11:20 AM Rainey Pines A wrote: Patient would like a callback from Dr. Juleen China nurse in regards to if she should keep her upcoming lab appointment  due to the fact she has an endoscopy on today at 2:30pm.

## 2019-02-03 NOTE — Patient Instructions (Signed)
Thank you for allowing [name] to care for you today!  Await pathology results by mail, approximately 2 weeks.  Continue taking the following medications until finished:         Zofran 40 mg by mouth three times per day       Nexium 20 mg by mouth every morning.       Carafate 1 Gram by mouth twice per day   Resume all other medications and diet as before.  Return to your normal activities tomorrow.     YOU HAD AN ENDOSCOPIC PROCEDURE TODAY AT [facility]:   Refer to the procedure report that was given to you for any specific questions about what was found during the examination.  If the procedure report does not answer your questions, please call your gastroenterologist to clarify.  If you requested that your care partner not be given the details of your procedure findings, then the procedure report has been included in a sealed envelope for you to review at your convenience later.  YOU SHOULD EXPECT: Some feelings of bloating in the abdomen. Passage of more gas than usual.  Walking can help get rid of the air that was put into your GI tract during the procedure and reduce the bloating. If you had a lower endoscopy (such as a colonoscopy or flexible sigmoidoscopy) you may notice spotting of blood in your stool or on the toilet paper. If you underwent a bowel prep for your procedure, you may not have a normal bowel movement for a few days.  Please Note:  You might notice some irritation and congestion in your nose or some drainage.  This is from the oxygen used during your procedure.  There is no need for concern and it should clear up in a day or so.  SYMPTOMS TO REPORT IMMEDIATELY:     Following upper endoscopy (EGD)  Vomiting of blood or coffee ground material  New chest pain or pain under the shoulder blades  Painful or persistently difficult swallowing  New shortness of breath  Fever of 100F or higher  Black, tarry-looking stools  For urgent or emergent issues, a  gastroenterologist can be reached at any hour by calling [phone].   DIET:  We do recommend a small meal at first, but then you may proceed to your regular diet.  Drink plenty of fluids but you should avoid alcoholic beverages for 24 hours.  ACTIVITY:  You should plan to take it easy for the rest of today and you should NOT DRIVE or use heavy machinery until tomorrow (because of the sedation medicines used during the test).    FOLLOW UP: Our staff will call the number listed on your records 48-72 hours following your procedure to check on you and address any questions or concerns that you may have regarding the information given to you following your procedure. If we do not reach you, we will leave a message.  We will attempt to reach you two times.  During this call, we will ask if you have developed any symptoms of COVID 19. If you develop any symptoms (ie: fever, flu-like symptoms, shortness of breath, cough etc.) before then, please call [phone].  If you test positive for Covid 19 in the 2 weeks post procedure, please call and report this information to [name].    If any biopsies were taken you will be contacted by phone or by letter within the next 1-3 weeks.  Please call us at [phone]  if you have not heard about the biopsies in 3 weeks.    SIGNATURES/CONFIDENTIALITY: You and/or your care partner have signed paperwork which will be entered into your electronic medical record.  These signatures attest to the fact that that the information above on your After Visit Summary has been reviewed and is understood.  Full responsibility of the confidentiality of this discharge information lies with you and/or your care-partner.

## 2019-02-03 NOTE — Progress Notes (Signed)
JB- temp CW- Vitals 

## 2019-02-03 NOTE — Progress Notes (Signed)
Pt's states no medical or surgical changes since previsit or office visit. 

## 2019-02-03 NOTE — Op Note (Signed)
[facility] Patient Name: Krystal Craig Procedure Date: 02/03/2019 2:28 PM MRN: [id number] Endoscopist: Ladene Artist , MD Age: 85 Referring MD:  Date of Birth: 13-Mar-1933 Gender: Female Account #: [id number] Procedure:                Upper GI endoscopy Indications:              Nausea Medicines:                Monitored Anesthesia Care Procedure:                Pre-Anesthesia Assessment:                           - Prior to the procedure, a History and Physical                            was performed, and patient medications and                            allergies were reviewed. The patient's tolerance of                            previous anesthesia was also reviewed. The risks                            and benefits of the procedure and the sedation                            options and risks were discussed with the patient.                            All questions were answered, and informed consent                            was obtained. Prior Anticoagulants: The patient has                            taken no previous anticoagulant or antiplatelet                            agents. ASA Grade Assessment: II - A patient with                            mild systemic disease. After reviewing the risks                            and benefits, the patient was deemed in                            satisfactory condition to undergo the procedure.                           After obtaining informed consent, the endoscope was  passed under direct vision. Throughout the                            procedure, the patient's blood pressure, pulse, and                            oxygen saturations were monitored continuously. The                            Endoscope was introduced through the mouth, and                            advanced to the second part of duodenum. The upper                            GI endoscopy was accomplished without  difficulty.                            The patient tolerated the procedure well. Scope In: Scope Out: Findings:                 The Z-line was variable and was found 38 cm from                            the incisors. Biopsies were taken with a cold                            forceps for histology.                           The exam of the esophagus was otherwise normal.                           A small hiatal hernia was present.                           The exam of the stomach was otherwise normal.                            Biopsies taken with a cold forceps in the antrum                            and body.                           The duodenal bulb and second portion of the                            duodenum were normal. Biopsies for histology were                            taken with a cold forceps for evaluation of celiac  disease. Complications:            No immediate complications. Estimated Blood Loss:     Estimated blood loss was minimal. Impression:               - Z-line variable, 38 cm from the incisors.                            Biopsied.                           - Small hiatal hernia.                           - Stomach otherwise normal Biopsied.                           - Normal duodenal bulb and second portion of the                            duodenum. Biopsied. Recommendation:           - Patient has a contact number available for                            emergencies. The signs and symptoms of potential                            delayed complications were discussed with the                            patient. Return to normal activities tomorrow.                            Written discharge instructions were provided to the                            patient.                           - Resume previous diet.                           - Continue present medications including Zofran 4                            mg po tid (not  prn), Nexium 20 mg OTC po qam and                            Carafate 1 g po bid (not prn).                           - Await pathology results. Ladene Artist, MD 02/03/2019 2:53:11 PM This report has been signed electronically.

## 2019-02-04 ENCOUNTER — Telehealth: Payer: Self-pay | Admitting: Family Medicine

## 2019-02-04 ENCOUNTER — Other Ambulatory Visit: Payer: Self-pay

## 2019-02-04 ENCOUNTER — Other Ambulatory Visit (INDEPENDENT_AMBULATORY_CARE_PROVIDER_SITE_OTHER): Payer: Medicare Other

## 2019-02-04 DIAGNOSIS — R710 Precipitous drop in hematocrit: Secondary | ICD-10-CM | POA: Diagnosis not present

## 2019-02-04 LAB — CBC WITH DIFFERENTIAL/PLATELET
Basophils Absolute: 0 10*3/uL (ref 0.0–0.1)
Basophils Relative: 0.3 % (ref 0.0–3.0)
Eosinophils Absolute: 0.1 10*3/uL (ref 0.0–0.7)
Eosinophils Relative: 1 % (ref 0.0–5.0)
HCT: 37.4 % (ref 36.0–46.0)
Hemoglobin: 12.4 g/dL (ref 12.0–15.0)
Lymphocytes Relative: 12.5 % (ref 12.0–46.0)
Lymphs Abs: 0.8 10*3/uL (ref 0.7–4.0)
MCHC: 33.2 g/dL (ref 30.0–36.0)
MCV: 89.5 fl (ref 78.0–100.0)
Monocytes Absolute: 0.5 10*3/uL (ref 0.1–1.0)
Monocytes Relative: 7.1 % (ref 3.0–12.0)
Neutro Abs: 5.2 10*3/uL (ref 1.4–7.7)
Neutrophils Relative %: 79.1 % — ABNORMAL HIGH (ref 43.0–77.0)
Platelets: 283 10*3/uL (ref 150.0–400.0)
RBC: 4.18 Mil/uL (ref 3.87–5.11)
RDW: 13.7 % (ref 11.5–15.5)
WBC: 6.6 10*3/uL (ref 4.0–10.5)

## 2019-02-04 NOTE — Telephone Encounter (Signed)
Spoke to son. Will call office back when at home to go over meds.

## 2019-02-04 NOTE — Telephone Encounter (Signed)
Patient's son Krystal Craig is calling back to speak to [name]. Please advise Thank you

## 2019-02-05 ENCOUNTER — Telehealth: Payer: Self-pay

## 2019-02-05 ENCOUNTER — Encounter: Payer: Self-pay | Admitting: Family Medicine

## 2019-02-05 LAB — IRON,TIBC AND FERRITIN PANEL
%SAT: 10 % (calc) — ABNORMAL LOW (ref 16–45)
Ferritin: 39 ng/mL (ref 16–288)
Iron: 36 ug/dL — ABNORMAL LOW (ref 45–160)
TIBC: 378 mcg/dL (calc) (ref 250–450)

## 2019-02-05 NOTE — Telephone Encounter (Signed)
Patient calling again with the same issues of nausea. Tells me she does not want to go thru the weekend like this. Is looking for advice.

## 2019-02-05 NOTE — Telephone Encounter (Signed)
  Follow up Call-  Call back number 02/03/2019  Post procedure Call Back phone  # [phone]  Permission to leave phone message Yes  Some recent data might be hidden     Patient questions:  Do you have a fever, pain , or abdominal swelling? No. Pain Score  0 *  Have you tolerated food without any problems? Yes.    Have you been able to return to your normal activities? Yes.    Do you have any questions about your discharge instructions: Diet   No. Medications  No. Follow up visit  No.  Do you have questions or concerns about your Care? No.  Actions: * If pain score is 4 or above: No action needed, pain <4. 1. Have you developed a fever since your procedure? no  2.   Have you had an respiratory symptoms (SOB or cough) since your procedure? no  3.   Have you tested positive for COVID 19 since your procedure no  4.   Have you had any family members/close contacts diagnosed with the COVID 19 since your procedure?  no   If yes to any of these questions please route to Joylene John, RN and Alphonsa Gin, Therapist, sports.

## 2019-02-05 NOTE — Telephone Encounter (Signed)
Called patient back. Said after she ate 1/2 an english muffin this morning, she was nauseated again. No emesis, no fever. BMs normal,denies abdominal pain. Not nauseated after liquid. I asked her about her meds, and she said she is so nauseated that she doesn't know what meds she has taken. I asked her to set out her Nexium, Zofran and Carafate and I reviewed with her how she should be taking them, based on Dr Lynne Leader recommendation on her EGD reports from 02/03/19. She had not taken her Nexium until after eating and had gotten nauseated. I let her know she needed to take that 30 mins. before eating her first food of the day. Encouraged her to increase her fluid intake to stay hydrated and to be careful to take her meds a prescribed. And let us know first of the week if no improvement in her symptoms

## 2019-02-08 ENCOUNTER — Telehealth: Payer: Self-pay | Admitting: Gastroenterology

## 2019-02-08 MED ORDER — SUCRALFATE 1 G PO TABS: 1.0000 g | ORAL_TABLET | Freq: Two times a day (BID) | ORAL | 0 refills | Status: DC

## 2019-02-08 MED ORDER — ONDANSETRON HCL 4 MG PO TABS: 4.0000 mg | ORAL_TABLET | Freq: Three times a day (TID) | ORAL | 0 refills | Status: DC

## 2019-02-08 NOTE — Telephone Encounter (Signed)
Reviewed all medications with son. She has not been taking as directed. Reviewed was to make sure that she is. Son will start doing pill packs as well as I have sent message with detailed information on all medications and how to take. Called today she states that she is still not feeling much better. However she sounded much more alert and strong today. She was more aware of treatment plan that she has been last several times I have talked to her. Will call and check in on her in few days after she has gotten medications back in system.

## 2019-02-08 NOTE — Telephone Encounter (Signed)
Patient is very confused on her medications.  I reviewed with her that she should be taking  Nexium 20 mg po q am Carafate 1 Gm BID Zofran 4 mg TID  She wrote this down., but does not seem clear that she understands.  I asked her to review the AVS that was sent home from her procedure last week.  She has not been taking the nexium or zofran, she is not sure if she has been taking the carafate.  I sent a refill of the zofran and carafate to the pharmacy.  She will check with her son about the AVS and help her with the meds. She will call back for any additional questions or concerns.

## 2019-02-09 ENCOUNTER — Other Ambulatory Visit: Payer: Self-pay

## 2019-02-09 DIAGNOSIS — R4182 Altered mental status, unspecified: Secondary | ICD-10-CM

## 2019-02-09 DIAGNOSIS — R110 Nausea: Secondary | ICD-10-CM

## 2019-02-09 DIAGNOSIS — Z636 Dependent relative needing care at home: Secondary | ICD-10-CM

## 2019-02-09 NOTE — Telephone Encounter (Signed)
Called let son know results. Per Dr. Juleen China and patient ok to order home health evaluation.

## 2019-02-11 NOTE — Telephone Encounter (Signed)
I spoke with pt's son and he had already spoke with Joellen about medication.

## 2019-02-12 ENCOUNTER — Encounter: Payer: Self-pay | Admitting: Gastroenterology

## 2019-02-12 ENCOUNTER — Encounter

## 2019-02-12 ENCOUNTER — Ambulatory Visit (INDEPENDENT_AMBULATORY_CARE_PROVIDER_SITE_OTHER): Payer: Medicare Other | Admitting: Gastroenterology

## 2019-02-12 VITALS — BP 134/80 | HR 68 | Temp 98.3°F | Ht 62.5 in | Wt 128.0 lb

## 2019-02-12 DIAGNOSIS — R110 Nausea: Secondary | ICD-10-CM

## 2019-02-12 MED ORDER — METOCLOPRAMIDE HCL 5 MG PO TABS: 5.0000 mg | ORAL_TABLET | Freq: Three times a day (TID) | ORAL | 0 refills | Status: DC

## 2019-02-12 NOTE — Progress Notes (Addendum)
02/12/2019 Krystal Craig [id number] 05-31-1933   HISTORY OF PRESENT ILLNESS: This is an 85 year old female who is here today for complaints of nausea.  She is here today with her son.  She has constant ongoing nausea on a daily basis.  Not affected by eating or not.  Denies abdominal pain.  She is on Nexium 20 mg daily and was recently placed on Carafate twice a day as well.  Those medicines have not helped her nausea.  She just had an EGD performed that showed only a variable Z line and a small hiatal hernia.  Gastric and duodenal biopsies were normal without H. pylori or celiac present.  Biopsies of the Z line did show changes consistent with Barrett's esophagus.  She does not feel like that she has any heartburn or reflux symptoms at present, however.  Aside from EGD, she has also had a CT scan of the abdomen and pelvis with contrast and a CT scan of the head since January of this year, all done for evaluation of complaints of nausea.  She does not have a gallbladder.  Her son tells me that earlier this year when she was having nausea they realized that she was off her Lexapro, but for an unknown period of time.  Once that medication got back into her system her nausea resolved.  They recently discovered that once again she has been off of her Lexapro, but again for an unknown period of time.  She has now been back on that medication again for the past week.  Zofran does not help her symptoms.  She says she has tried Phenergan in the past without any help.  She is already on clonazepam as an anxiolytic as well.  She denies any feelings of anxiety or depression.  They recall that possibly she had been on Reglan in the past with some relief of symptoms.  She describes the nausea as "all-consuming".   Past Medical History:  Diagnosis Date  . Allergic rhinitis   . Anemia   . Chest pain with normal stress test, 2013   . Chronic kidney disease, stage III (moderate) (HCC)    pt denies  .  Complication of anesthesia   . Diaphragmatic hernia 06/26/2007  . Diverticulosis   . Hiatal hernia   . History of bronchitis   . IBS (irritable bowel syndrome)   . Impacted cerumen of both ears   . Insomnia   . Lumbar back pain   . Major depression in partial remission ([place])   . Malignant neoplasm of bronchus and lung, 2006   . Osteoarthritis   . Osteopenia   . PONV (postoperative nausea and vomiting)   . Pre-diabetes   . Psoriasis   . Pure hypercholesterolemia   . Vitamin D deficiency    Past Surgical History:  Procedure Laterality Date  . ABDOMINAL HYSTERECTOMY  1969  . APPENDECTOMY    . bilateral shouler operations for impingement syndromes Bilateral 2001  . BUNIONECTOMY Left   . CATARACT EXTRACTION, BILATERAL Bilateral 2012  . CHOLECYSTECTOMY  1976  . COLONOSCOPY    . LAPAROSCOPIC LYSIS INTESTINAL ADHESIONS    . left lower lobectomy  11/2004   via VATS and mini-thoractomy for 1.5cm adenoca of lung by Dr. Arlyce Dice  . LUMBAR LAMINECTOMY/DECOMPRESSION MICRODISCECTOMY Left 01/05/2015   Procedure: L3-4 DECOMPRESSION MICRODISCECTOMY ON LEFT;  Surgeon: Melina Schools, MD;  Location: [facility];  Service: Orthopedics;  Laterality: Left;  left Lumbar 3-4  . POLYPECTOMY    .  turmor ear Right 09/2013  . urologic sugery with cystocele/rectocele repairs and sling  03/2007   Dr. Terance Hart    reports that she quit smoking about 34 years ago. Her smoking use included cigarettes. She has a 30.00 pack-year smoking history. She has never used smokeless tobacco. She reports that she does not drink alcohol or use drugs. family history includes Breast cancer (age of onset: 43) in her sister; CVA in her father; Dementia in her mother; Heart attack in her mother; Stroke in her father. Allergies  Allergen Reactions  . Ciprocin-Fluocin-Procin [Fluocinolone] Other (See Comments)    Leg and arm numbness  . Codeine     Chest pain/tightness  . Wellbutrin [Bupropion] Rash      Outpatient Encounter  Medications as of 02/12/2019  Medication Sig  . acetaminophen (TYLENOL ARTHRITIS PAIN) 650 MG CR tablet Take 650 mg by mouth every 8 (eight) hours as needed for pain.  Marland Kitchen aspirin 81 MG tablet Take 81 mg by mouth daily.  . Calcium Carbonate-Vitamin D (CALCIUM-VITAMIN D) 500-200 MG-UNIT tablet Take 1 tablet by mouth daily.  . clonazePAM (KLONOPIN) 0.5 MG tablet Take 1 tablet (0.5 mg total) by mouth 2 (two) times daily as needed. for anxiety  . escitalopram (LEXAPRO) 10 MG tablet Take 1 tablet (10 mg total) by mouth daily.  Marland Kitchen esomeprazole (NEXIUM) 20 MG capsule Take 20 mg by mouth daily at 12 noon.  . Multiple Vitamins-Minerals (MULTIVITAMIN WITH MINERALS) tablet Take 1 tablet by mouth daily.  . ondansetron (ZOFRAN) 4 MG tablet Take 1 tablet (4 mg total) by mouth 3 (three) times daily.  . Probiotic Product (PROBIOTIC PO) Take 1 tablet by mouth daily.  . simvastatin (ZOCOR) 40 MG tablet Take 1 tablet (40 mg total) by mouth at bedtime.  . sucralfate (CARAFATE) 1 g tablet Take 1 tablet (1 g total) by mouth 2 (two) times daily.  . traMADol (ULTRAM) 50 MG tablet Take 1 tablet (50 mg total) by mouth every 6 (six) hours as needed.  . zolpidem (AMBIEN) 10 MG tablet Take 1 tablet (10 mg total) by mouth at bedtime as needed. for sleep  . [DISCONTINUED] conjugated estrogens (PREMARIN) vaginal cream Place 1 Applicatorful vaginally as needed.   Facility-Administered Encounter Medications as of 02/12/2019  Medication  . 0.9 %  sodium chloride infusion     REVIEW OF SYSTEMS  : All other systems reviewed and negative except where noted in the History of Present Illness.   PHYSICAL EXAM: BP 134/80 (BP Location: Left Arm, Patient Position: Sitting, Cuff Size: Normal)   Pulse 68   Temp 98.3 F (36.8 C)   Ht 5' 2.5" (1.588 m) Comment: height measured without shoes  Wt 128 lb (58.1 kg)   LMP  (LMP Unknown)   BMI 23.04 kg/m  General: Well developed white female in no acute distress Head: Normocephalic and  atraumatic Eyes:  Sclerae anicteric, conjunctiva pink. Ears: Normal auditory acuity Lungs: Clear throughout to auscultation; no increased WOB. Heart: Regular rate and rhythm; no M/R/G. Abdomen: Soft, non-distended.  BS present.  Non-tender. Musculoskeletal: Symmetrical with no gross deformities  Skin: No lesions on visible extremities Extremities: No edema  Neurological: Alert oriented x 4, grossly non-focal Psychological:  Alert and cooperative. Normal mood and affect  ASSESSMENT AND PLAN: *Chronic constant nausea: I am not sure what is causing this symptom as she is had extensive evaluation, which has been unremarkable.  She does have reflux as evidenced by some mild changes consistent with Barrett's esophagus on  EGD, but is on daily PPI and does not feel like reflux symptoms are predominant.  Her son expresses that she had been off of her Lexapro previously when she had the same complaint in the past she had been off of her Lexapro and once it was restarted and got back into her system her nausea resolved.  He is hoping that the same thing will happen this time around.  She has only been back on the Lexapro for a week.  In the interim he is asking for something else to try to help manage her nausea.  Has failed Zofran, Phenergan, and anxiolytics.  I am going to give her Reglan 5 mg to take 3 times daily for the next month.  I did advise them on side effects of the medication and the hope would be that if she starts to feel better as time goes on with her Lexapro back on board that we can try to reduce her use of Reglan and potentially discontinue it altogether.  She will continue her Nexium 20 mg, but will discontinue the Carafate and discontinue the Zofran as neither of those have seemed to help at this point.  Only other option from a GI standpoint would to be to put her on high dose acid suppression and Carafate 4 times a day to max out her reflux therapy and see if that helps.  Otherwise I am  afraid we may not have anything else to offer from a GI standpoint.  **25 minutes was spent with the patient, at least 50% of which was spent in discussion of treatment options, etc.  CC:  Briscoe Deutscher, DO

## 2019-02-12 NOTE — Patient Instructions (Signed)
We have sent the following medications to your pharmacy for you to pick up at your convenience: Reglan 10 mg three times a day.   Discontinue carafate and zofran.  Send a message in my chart with a update on your symptoms in 2 weeks.

## 2019-02-12 NOTE — Progress Notes (Signed)
Reviewed and agree with management plan.  Jhayla Podgorski T. Binnie Vonderhaar, MD FACG [facility]  

## 2019-02-22 ENCOUNTER — Encounter: Payer: Self-pay | Admitting: Gastroenterology

## 2019-02-25 ENCOUNTER — Telehealth: Payer: Self-pay | Admitting: Gastroenterology

## 2019-02-25 MED ORDER — METOCLOPRAMIDE HCL 5 MG PO TABS: 5.0000 mg | ORAL_TABLET | Freq: Three times a day (TID) | ORAL | 0 refills | Status: DC

## 2019-02-25 NOTE — Telephone Encounter (Signed)
Informed patient prescription for Reglan was sent to her pharmacy on 02/12/19 but I will send medication again electronically to [facility]. Patient states she didn't even realize she was supposed to get a medication after her appt. Informed patient I told her and her son at her appt with Alonza Bogus, PA that we were sending Reglan to her pharmacy. Patient states she didn't realize but will go pick it up now if I resend it.

## 2019-03-05 ENCOUNTER — Ambulatory Visit: Payer: Medicare Other | Admitting: Family Medicine

## 2019-03-09 ENCOUNTER — Telehealth: Payer: Self-pay | Admitting: Family Medicine

## 2019-03-09 DIAGNOSIS — F5101 Primary insomnia: Secondary | ICD-10-CM

## 2019-03-09 MED ORDER — ZOLPIDEM TARTRATE 10 MG PO TABS: 10.0000 mg | ORAL_TABLET | Freq: Every evening | ORAL | 4 refills | Status: DC | PRN

## 2019-03-09 NOTE — Telephone Encounter (Signed)
Medication: zolpidem (AMBIEN) 10 MG tablet    Patient is requesting a refill of this medication.    Pharmacy:  [facility], [address] [phone] (Phone) [phone] (Fax)

## 2019-03-09 NOTE — Telephone Encounter (Signed)
Last fill 01/19/19  #30/0 Last OV 12/02/18 Next Ov 03/15/19

## 2019-03-14 NOTE — Progress Notes (Deleted)
Krystal Craig is a 85 y.o. female is here for follow up.  History of Present Illness:   (SCRIBE ATTESTATION)  HPI:   Health Maintenance Due  Topic Date Due  . INFLUENZA VACCINE  01/16/2019   Depression screen [facility] 2/9 06/29/2018 04/27/2018 12/22/2017  Decreased Interest 0 0 1  Down, Depressed, Hopeless 0 0 1  PHQ - 2 Score 0 0 2  Altered sleeping 0 0 0  Tired, decreased energy 0 0 0  Change in appetite 0 0 0  Feeling bad or failure about yourself  0 0 0  Trouble concentrating 0 0 0  Moving slowly or fidgety/restless 0 0 0  Suicidal thoughts 0 0 0  PHQ-9 Score 0 0 2  Difficult doing work/chores Not difficult at all Somewhat difficult Somewhat difficult  Some recent data might be hidden   PMHx, SurgHx, SocialHx, FamHx, Medications, and Allergies were reviewed in the Visit Navigator and updated as appropriate.   Patient Active Problem List   Diagnosis Date Noted  . Nausea without vomiting 02/12/2019  . PND (post-nasal drip) 07/01/2018  . History of cholecystectomy 05/31/2018  . Age-related osteoporosis without current pathological fracture, does not want treatment 05/31/2018  . Caregiver burden 05/31/2018  . Insulin resistance 05/31/2018  . Chronic pain syndrome 05/31/2018  . Abnormal findings on diagnostic imaging of other abdominal regions, liver and pancreas 05/31/2018  . Aortic atherosclerosis ([place]) 05/26/2018  . Degenerative arthritis of lumbar spine 05/26/2018  . Situational depression 12/22/2017  . Diverticulosis 11/11/2016  . Hx of cancer of lung, s/p left lobectomy 2006 11/11/2016  . Situational anxiety 11/06/2016  . Primary osteoarthritis of both first carpometacarpal joints 12/28/2015  . Microscopic hematuria, with previous evaluation by Urology and determined to be benign 09/12/2014  . Psoriasis, followed by [facility] 02/28/2014  . CKD (chronic kidney disease), stage III ([place]) 02/28/2014  . Thoracic lymphadenopathy 09/25/2011  . Hyperlipidemia, Rx  Zocor 09/25/2011  . Insomnia, takes 5 mg Ambien prn 08/03/2008  . Allergic rhinitis 06/26/2007  . GERD 06/26/2007  . Irritable bowel syndrome 06/26/2007   Social History   Tobacco Use  . Smoking status: Former Smoker    Packs/day: 1.00    Years: 30.00    Pack years: 30.00    Types: Cigarettes    Quit date: 06/17/1984    Years since quitting: 34.7  . Smokeless tobacco: Never Used  Substance Use Topics  . Alcohol use: No    Alcohol/week: 0.0 standard drinks  . Drug use: No   Current Medications and Allergies   Current Outpatient Medications:  .  acetaminophen (TYLENOL ARTHRITIS PAIN) 650 MG CR tablet, Take 650 mg by mouth every 8 (eight) hours as needed for pain., Disp: , Rfl:  .  aspirin 81 MG tablet, Take 81 mg by mouth daily., Disp: , Rfl:  .  Calcium Carbonate-Vitamin D (CALCIUM-VITAMIN D) 500-200 MG-UNIT tablet, Take 1 tablet by mouth daily., Disp: , Rfl:  .  clonazePAM (KLONOPIN) 0.5 MG tablet, Take 1 tablet (0.5 mg total) by mouth 2 (two) times daily as needed. for anxiety, Disp: 60 tablet, Rfl: 2 .  escitalopram (LEXAPRO) 10 MG tablet, Take 1 tablet (10 mg total) by mouth daily., Disp: 90 tablet, Rfl: 1 .  esomeprazole (NEXIUM) 20 MG capsule, Take 20 mg by mouth daily at 12 noon., Disp: , Rfl:  .  metoCLOPramide (REGLAN) 5 MG tablet, Take 1 tablet (5 mg total) by mouth 3 (three) times daily before meals., Disp: 90 tablet,  Rfl: 0 .  Multiple Vitamins-Minerals (MULTIVITAMIN WITH MINERALS) tablet, Take 1 tablet by mouth daily., Disp: , Rfl:  .  ondansetron (ZOFRAN) 4 MG tablet, Take 1 tablet (4 mg total) by mouth 3 (three) times daily., Disp: 90 tablet, Rfl: 0 .  Probiotic Product (PROBIOTIC PO), Take 1 tablet by mouth daily., Disp: , Rfl:  .  simvastatin (ZOCOR) 40 MG tablet, Take 1 tablet (40 mg total) by mouth at bedtime., Disp: 90 tablet, Rfl: 0 .  sucralfate (CARAFATE) 1 g tablet, Take 1 tablet (1 g total) by mouth 2 (two) times daily., Disp: 60 tablet, Rfl: 0 .  traMADol  (ULTRAM) 50 MG tablet, Take 1 tablet (50 mg total) by mouth every 6 (six) hours as needed., Disp: 90 tablet, Rfl: 0 .  zolpidem (AMBIEN) 10 MG tablet, Take 1 tablet (10 mg total) by mouth at bedtime as needed. for sleep, Disp: 30 tablet, Rfl: 4  Allergies  Allergen Reactions  . Ciprocin-Fluocin-Procin [Fluocinolone] Other (See Comments)    Leg and arm numbness  . Codeine     Chest pain/tightness  . Wellbutrin [Bupropion] Rash   Review of Systems   Pertinent items are noted in the HPI. Otherwise, a complete ROS is negative.  Vitals  There were no vitals filed for this visit.   There is no height or weight on file to calculate BMI.  Physical Exam   Physical Exam  Results for orders placed or performed in visit on 02/04/19  Iron, TIBC and Ferritin Panel  Result Value Ref Range   Iron 36 (L) 45 - 160 mcg/dL   TIBC 378 250 - 450 mcg/dL (calc)   %SAT 10 (L) 16 - 45 % (calc)   Ferritin 39 16 - 288 ng/mL  CBC with Differential/Platelet  Result Value Ref Range   WBC 6.6 4.0 - 10.5 K/uL   RBC 4.18 3.87 - 5.11 Mil/uL   Hemoglobin 12.4 12.0 - 15.0 g/dL   HCT 37.4 36.0 - 46.0 %   MCV 89.5 78.0 - 100.0 fl   MCHC 33.2 30.0 - 36.0 g/dL   RDW 13.7 11.5 - 15.5 %   Platelets 283.0 150.0 - 400.0 K/uL   Neutrophils Relative % 79.1 (H) 43.0 - 77.0 %   Lymphocytes Relative 12.5 12.0 - 46.0 %   Monocytes Relative 7.1 3.0 - 12.0 %   Eosinophils Relative 1.0 0.0 - 5.0 %   Basophils Relative 0.3 0.0 - 3.0 %   Neutro Abs 5.2 1.4 - 7.7 K/uL   Lymphs Abs 0.8 0.7 - 4.0 K/uL   Monocytes Absolute 0.5 0.1 - 1.0 K/uL   Eosinophils Absolute 0.1 0.0 - 0.7 K/uL   Basophils Absolute 0.0 0.0 - 0.1 K/uL    Assessment and Plan   There are no diagnoses linked to this encounter.  . Orders and follow up as documented in [facility], reviewed diet, exercise and weight control, cardiovascular risk and specific lipid/LDL goals reviewed, reviewed medications and side effects in detail.  . Reviewed expectations  re: course of current medical issues. . Outlined signs and symptoms indicating need for more acute intervention. . Patient verbalized understanding and all questions were answered. . Patient received an After Visit Summary.  *** CMA served as Education administrator during this visit. History, Physical, and Plan performed by medical provider. The above documentation has been reviewed and is accurate and complete. Briscoe Deutscher, D.O.  Briscoe Deutscher, DO [place], [facility] 03/14/2019

## 2019-03-15 ENCOUNTER — Ambulatory Visit: Payer: Medicare Other | Admitting: Family Medicine

## 2019-03-18 DIAGNOSIS — Z23 Encounter for immunization: Secondary | ICD-10-CM | POA: Diagnosis not present

## 2019-03-21 NOTE — Progress Notes (Signed)
Krystal Craig is a 85 y.o. female is here for follow up.  History of Present Illness:   HPI: Patient presents with son. Concerned about dementia, anxiety, depression. She lives in own home with husband, also with mild to moderate depression. Husband (not my patient) still drives while she navigates. Son in charge of medications at this point. Patient still able to handle finances.   1. Insulin resistance.  Lab Results  Component Value Date   HGBA1C 5.7 11/26/2017    2. Stage 3a chronic kidney disease.  Lab Results  Component Value Date   CREATININE 0.96 03/22/2019   CREATININE 0.89 01/19/2019   CREATININE 0.92 01/15/2019     3. Microscopic hematuria, with previous evaluation by Urology and determined to be benign.   4. Hx of cancer of lung, s/p left lobectomy 2006. Normal CXR 11/2018.   5. Mixed hyperlipidemia.  Lab Results  Component Value Date   CHOL 183 12/02/2018   HDL 63.20 12/02/2018   LDLCALC 75 01/26/2016   LDLDIRECT 92.0 12/02/2018   TRIG 208.0 (H) 12/02/2018   CHOLHDL 3 12/02/2018   Lab Results  Component Value Date   ALT 19 03/22/2019   AST 20 03/22/2019   ALKPHOS 71 03/22/2019   BILITOT 0.8 03/22/2019    6. Primary insomnia. Has been on Ambien for many years.    7. GAD (generalized anxiety disorder). Moderate to severe at times.     8. Memory change. To Psychiatry v Neurology.    Mini-Cog - 03/22/19 1540    Normal clock drawing test?  no    How many words correct?  0      Able to draw clock, but unable to correctly set hands to 10 past 11.   There are no preventive care reminders to display for this patient.   Depression screen [facility] 2/9 03/22/2019 06/29/2018 04/27/2018  Decreased Interest 2 0 0  Down, Depressed, Hopeless 2 0 0  PHQ - 2 Score 4 0 0  Altered sleeping 0 0 0  Tired, decreased energy 0 0 0  Change in appetite 0 0 0  Feeling bad or failure about yourself  0 0 0  Trouble concentrating 0 0 0  Moving slowly or fidgety/restless 0  0 0  Suicidal thoughts 0 0 0  PHQ-9 Score 4 0 0  Difficult doing work/chores Somewhat difficult Not difficult at all Somewhat difficult  Some recent data might be hidden   PMHx, SurgHx, SocialHx, FamHx, Medications, and Allergies were reviewed in the Visit Navigator and updated as appropriate.   Patient Active Problem List   Diagnosis Date Noted  . Nausea without vomiting 02/12/2019  . PND (post-nasal drip) 07/01/2018  . History of cholecystectomy 05/31/2018  . Age-related osteoporosis without current pathological fracture, does not want treatment 05/31/2018  . Caregiver burden 05/31/2018  . Insulin resistance 05/31/2018  . Chronic pain syndrome 05/31/2018  . Abnormal findings on diagnostic imaging of other abdominal regions, liver and pancreas 05/31/2018  . Aortic atherosclerosis ([place]) 05/26/2018  . Degenerative arthritis of lumbar spine 05/26/2018  . Situational depression 12/22/2017  . Diverticulosis 11/11/2016  . Hx of cancer of lung, s/p left lobectomy 2006 11/11/2016  . Situational anxiety 11/06/2016  . Primary osteoarthritis of both first carpometacarpal joints 12/28/2015  . Microscopic hematuria, with previous evaluation by Urology and determined to be benign 09/12/2014  . Psoriasis, followed by [facility] 02/28/2014  . CKD (chronic kidney disease), stage III 02/28/2014  . Thoracic lymphadenopathy 09/25/2011  .  Hyperlipidemia, Rx Zocor 09/25/2011  . Insomnia, takes 5 mg Ambien prn 08/03/2008  . Allergic rhinitis 06/26/2007  . GERD 06/26/2007  . Irritable bowel syndrome 06/26/2007   Social History   Tobacco Use  . Smoking status: Former Smoker    Packs/day: 1.00    Years: 30.00    Pack years: 30.00    Types: Cigarettes    Quit date: 06/17/1984    Years since quitting: 34.8  . Smokeless tobacco: Never Used  Substance Use Topics  . Alcohol use: No    Alcohol/week: 0.0 standard drinks  . Drug use: No   Current Medications and Allergies   .  acetaminophen  (TYLENOL ARTHRITIS PAIN) 650 MG CR tablet, Take 650 mg by mouth every 8 (eight) hours as needed for pain., Disp: , Rfl:  .  aspirin 81 MG tablet, Take 81 mg by mouth daily., Disp: , Rfl:  .  clonazePAM (KLONOPIN) 0.5 MG tablet, Take 1 tablet (0.5 mg total) by mouth 2 (two) times daily as needed. for anxiety (Patient taking differently: Take 0.5 mg by mouth daily. for anxiety), Disp: 60 tablet, Rfl: 2 .  escitalopram (LEXAPRO) 10 MG tablet, Take 1 tablet (10 mg total) by mouth daily., Disp: 90 tablet, Rfl: 1 .  metoCLOPramide (REGLAN) 5 MG tablet, Take 1 tablet (5 mg total) by mouth 3 (three) times daily before meals., Disp: 90 tablet, Rfl: 0 .  Multiple Vitamins-Minerals (MULTIVITAMIN WITH MINERALS) tablet, Take 1 tablet by mouth daily., Disp: , Rfl:  .  Probiotic Product (PROBIOTIC PO), Take 1 tablet by mouth daily., Disp: , Rfl:  .  simvastatin (ZOCOR) 40 MG tablet, Take 1 tablet (40 mg total) by mouth at bedtime., Disp: 90 tablet, Rfl: 0 .  traMADol (ULTRAM) 50 MG tablet, Take 1 tablet (50 mg total) by mouth every 6 (six) hours as needed., Disp: 90 tablet, Rfl: 0 .  zolpidem (AMBIEN) 10 MG tablet, Take 1 tablet (10 mg total) by mouth at bedtime as needed. for sleep, Disp: 30 tablet, Rfl: 4 .  Calcium Carbonate-Vitamin D (CALCIUM-VITAMIN D) 500-200 MG-UNIT tablet, Take 1 tablet by mouth daily., Disp: , Rfl:    Allergies  Allergen Reactions  . Ciprocin-Fluocin-Procin [Fluocinolone] Other (See Comments)    Leg and arm numbness  . Codeine     Chest pain/tightness  . Wellbutrin [Bupropion] Rash   Review of Systems   Pertinent items are noted in the HPI. Otherwise, a complete ROS is negative.  Vitals   Vitals:   03/22/19 1106  BP: (!) 158/80  Pulse: 67  Temp: 98.2 F (36.8 C)  TempSrc: Temporal  SpO2: 96%  Weight: 130 lb (59 kg)  Height: 5' 2.5" (1.588 m)     Body mass index is 23.4 kg/m.  Physical Exam   Physical Exam Vitals signs and nursing note reviewed.  HENT:      Head: Normocephalic and atraumatic.  Eyes:     Pupils: Pupils are equal, round, and reactive to light.  Neck:     Musculoskeletal: Normal range of motion and neck supple.  Cardiovascular:     Rate and Rhythm: Normal rate and regular rhythm.     Heart sounds: Normal heart sounds.  Pulmonary:     Effort: Pulmonary effort is normal.  Abdominal:     Palpations: Abdomen is soft.  Skin:    General: Skin is warm.  Psychiatric:        Behavior: Behavior normal.    Assessment and [place]  was seen today for follow-up.  Diagnoses and all orders for this visit:  Insulin resistance -     Comprehensive metabolic panel  Stage 3a chronic kidney disease -     Comprehensive metabolic panel  Microscopic hematuria, with previous evaluation by Urology and determined to be benign -     Urinalysis, Routine w reflex microscopic  Hx of cancer of lung, s/p left lobectomy 2006  Mixed hyperlipidemia  Primary insomnia Comments: Long discussion re: transitioning off Ambien over the next few months. She is worried lack of sleep. Son is on board, so patient willing to try.   GAD (generalized anxiety disorder) -     Ambulatory referral to Psychiatry  Memory change Comments: To Psych v Neuro to tease out how much anxiety/insomnia/current medications contributing to symptoms.  Orders: -     Ambulatory referral to Psychiatry  Fatigue, unspecified type -     CBC with Differential/Platelet -     Comprehensive metabolic panel -     Magnesium -     TSH -     Vitamin B12 -     Iron, TIBC and Ferritin Panel -     Urine Culture -     Urinalysis, Routine w reflex microscopic   . Orders and follow up as documented in [place], reviewed diet, exercise and weight control, cardiovascular risk and specific lipid/LDL goals reviewed, reviewed medications and side effects in detail.  . Reviewed expectations re: course of current medical issues. . Outlined signs and symptoms indicating need for more  acute intervention. . Patient verbalized understanding and all questions were answered. . Patient received an After Visit Summary.  CMA served as Education administrator during this visit. History, Physical, and Plan performed by medical provider. The above documentation has been reviewed and is accurate and complete. Briscoe Deutscher, D.O.  Briscoe Deutscher, DO [place], [facility] 04/04/2019

## 2019-03-22 ENCOUNTER — Encounter: Payer: Self-pay | Admitting: Family Medicine

## 2019-03-22 ENCOUNTER — Other Ambulatory Visit: Payer: Self-pay

## 2019-03-22 ENCOUNTER — Ambulatory Visit (INDEPENDENT_AMBULATORY_CARE_PROVIDER_SITE_OTHER): Payer: Medicare Other | Admitting: Family Medicine

## 2019-03-22 VITALS — BP 158/80 | HR 67 | Temp 98.2°F | Ht 62.5 in | Wt 130.0 lb

## 2019-03-22 DIAGNOSIS — D649 Anemia, unspecified: Secondary | ICD-10-CM | POA: Diagnosis not present

## 2019-03-22 DIAGNOSIS — E782 Mixed hyperlipidemia: Secondary | ICD-10-CM

## 2019-03-22 DIAGNOSIS — R3129 Other microscopic hematuria: Secondary | ICD-10-CM | POA: Diagnosis not present

## 2019-03-22 DIAGNOSIS — N1831 Chronic kidney disease, stage 3a: Secondary | ICD-10-CM | POA: Diagnosis not present

## 2019-03-22 DIAGNOSIS — F5101 Primary insomnia: Secondary | ICD-10-CM

## 2019-03-22 DIAGNOSIS — E8881 Metabolic syndrome: Secondary | ICD-10-CM | POA: Diagnosis not present

## 2019-03-22 DIAGNOSIS — E88819 Insulin resistance, unspecified: Secondary | ICD-10-CM

## 2019-03-22 DIAGNOSIS — R413 Other amnesia: Secondary | ICD-10-CM

## 2019-03-22 DIAGNOSIS — Z85118 Personal history of other malignant neoplasm of bronchus and lung: Secondary | ICD-10-CM

## 2019-03-22 DIAGNOSIS — R5383 Other fatigue: Secondary | ICD-10-CM

## 2019-03-22 DIAGNOSIS — F411 Generalized anxiety disorder: Secondary | ICD-10-CM

## 2019-03-22 LAB — CBC WITH DIFFERENTIAL/PLATELET
Basophils Absolute: 0 10*3/uL (ref 0.0–0.1)
Basophils Relative: 0.4 % (ref 0.0–3.0)
Eosinophils Absolute: 0.2 10*3/uL (ref 0.0–0.7)
Eosinophils Relative: 2 % (ref 0.0–5.0)
HCT: 39.1 % (ref 36.0–46.0)
Hemoglobin: 13 g/dL (ref 12.0–15.0)
Lymphocytes Relative: 9.9 % — ABNORMAL LOW (ref 12.0–46.0)
Lymphs Abs: 0.9 10*3/uL (ref 0.7–4.0)
MCHC: 33.3 g/dL (ref 30.0–36.0)
MCV: 87.3 fl (ref 78.0–100.0)
Monocytes Absolute: 0.6 10*3/uL (ref 0.1–1.0)
Monocytes Relative: 6.1 % (ref 3.0–12.0)
Neutro Abs: 7.5 10*3/uL (ref 1.4–7.7)
Neutrophils Relative %: 81.6 % — ABNORMAL HIGH (ref 43.0–77.0)
Platelets: 308 10*3/uL (ref 150.0–400.0)
RBC: 4.48 Mil/uL (ref 3.87–5.11)
RDW: 13.4 % (ref 11.5–15.5)
WBC: 9.2 10*3/uL (ref 4.0–10.5)

## 2019-03-22 LAB — COMPREHENSIVE METABOLIC PANEL
ALT: 19 U/L (ref 0–35)
AST: 20 U/L (ref 0–37)
Albumin: 4.3 g/dL (ref 3.5–5.2)
Alkaline Phosphatase: 71 U/L (ref 39–117)
BUN: 16 mg/dL (ref 6–23)
CO2: 28 mEq/L (ref 19–32)
Calcium: 10.7 mg/dL — ABNORMAL HIGH (ref 8.4–10.5)
Chloride: 99 mEq/L (ref 96–112)
Creatinine, Ser: 0.96 mg/dL (ref 0.40–1.20)
GFR: 55.38 mL/min — ABNORMAL LOW (ref >60.00)
Glucose, Bld: 86 mg/dL (ref 70–99)
Potassium: 4.8 mEq/L (ref 3.5–5.1)
Sodium: 137 mEq/L (ref 135–145)
Total Bilirubin: 0.8 mg/dL (ref 0.2–1.2)
Total Protein: 6.7 g/dL (ref 6.0–8.3)

## 2019-03-22 LAB — URINALYSIS, ROUTINE W REFLEX MICROSCOPIC
Bilirubin Urine: NEGATIVE
Hgb urine dipstick: NEGATIVE
Ketones, ur: NEGATIVE
Nitrite: NEGATIVE
RBC / HPF: NONE SEEN (ref 0–?)
Specific Gravity, Urine: 1.025 (ref 1.000–1.030)
Total Protein, Urine: 100 — AB
Urine Glucose: NEGATIVE
Urobilinogen, UA: 0.2 (ref 0.0–1.0)
pH: 6.5 (ref 5.0–8.0)

## 2019-03-22 LAB — TSH: TSH: 0.84 u[IU]/mL (ref 0.35–4.50)

## 2019-03-22 LAB — MAGNESIUM: Magnesium: 2 mg/dL (ref 1.5–2.5)

## 2019-03-22 LAB — VITAMIN B12: Vitamin B-12: 392 pg/mL (ref 211–911)

## 2019-03-24 LAB — URINE CULTURE
MICRO NUMBER:: 954583
Result:: NO GROWTH
SPECIMEN QUALITY:: ADEQUATE

## 2019-03-24 LAB — IRON,TIBC AND FERRITIN PANEL
%SAT: 18 % (calc) (ref 16–45)
Ferritin: 26 ng/mL (ref 16–288)
Iron: 78 ug/dL (ref 45–160)
TIBC: 422 mcg/dL (calc) (ref 250–450)

## 2019-03-25 ENCOUNTER — Encounter: Payer: Self-pay | Admitting: Gynecology

## 2019-03-31 ENCOUNTER — Telehealth: Payer: Self-pay | Admitting: Physical Therapy

## 2019-03-31 NOTE — Telephone Encounter (Signed)
Copied from [place] [id number]. Topic: General - Other >> Mar 31, 2019  2:38 PM Leward Quan A wrote: Reason for CRM: Patient son Gwenlyn Fudge called to inquire about a referral that was to have been made to a geriatric psychologist for the patient. He is asking for a call back because patient think someone called but due to failing memory its hard to say who or why. [name] would like a call back with an update to his phone Ph#  [phone]

## 2019-04-01 NOTE — Telephone Encounter (Signed)
Called son gave all information he will call office to make app. I have also updated his number to primary.

## 2019-04-02 NOTE — Telephone Encounter (Signed)
Please advise FYI

## 2019-04-02 NOTE — Telephone Encounter (Signed)
Pt's son [name] stated pt's referral was denied due to provider not accepting any new patients. He would like to know what the next step is. Please advise. CB#[phone]

## 2019-04-02 NOTE — Telephone Encounter (Signed)
See below

## 2019-04-03 NOTE — Telephone Encounter (Signed)
Make sure that Geriatric Psychiatry was contacted (not Psychology). Offer Geriatrician referral.

## 2019-04-05 NOTE — Telephone Encounter (Signed)
Hey if you get time can you help me not sure how to put this referral in.

## 2019-04-07 ENCOUNTER — Ambulatory Visit: Payer: Medicare Other | Admitting: Family Medicine

## 2019-04-07 ENCOUNTER — Ambulatory Visit: Payer: Medicare Other | Admitting: Gastroenterology

## 2019-04-07 ENCOUNTER — Encounter: Payer: Self-pay | Admitting: Gastroenterology

## 2019-04-07 VITALS — BP 158/68 | HR 68 | Temp 97.9°F | Ht 62.5 in | Wt 130.4 lb

## 2019-04-07 DIAGNOSIS — K22719 Barrett's esophagus with dysplasia, unspecified: Secondary | ICD-10-CM | POA: Insufficient documentation

## 2019-04-07 DIAGNOSIS — R110 Nausea: Secondary | ICD-10-CM

## 2019-04-07 HISTORY — DX: Barrett's esophagus with dysplasia, unspecified: K22.719

## 2019-04-07 MED ORDER — ESOMEPRAZOLE MAGNESIUM 20 MG PO CPDR: 20.0000 mg | DELAYED_RELEASE_CAPSULE | Freq: Every day | ORAL | 11 refills | Status: DC

## 2019-04-07 NOTE — Progress Notes (Signed)
    History of Present Illness: This is an 85 year old female returning for nausea which was severe in August. She is accompanied by her son. See Alonza Bogus, [facility] 8/28 office note. Resumed Lexapro and her nausea resolved. Reglan was helpful for a couple weeks and she discontinued it. GERD under control however she discontinued Nexium. No GI complaints today.   Current Medications, Allergies, Past Medical History, Past Surgical History, Family History and Social History were reviewed in [employer] record.   Physical Exam: General: Well developed, well nourished, no acute distress Head: Normocephalic and atraumatic Eyes:  sclerae anicteric, EOMI Ears: Normal auditory acuity Mouth: No deformity or lesions Lungs: Clear throughout to auscultation Heart: Regular rate and rhythm; no murmurs, rubs or bruits Abdomen: Soft, non tender and non distended. No masses, hepatosplenomegaly or hernias noted. Normal Bowel sounds Rectal: Not done Musculoskeletal: Symmetrical with no gross deformities  Pulses:  Normal pulses noted Extremities: No clubbing, cyanosis, edema or deformities noted Neurological: Alert oriented x 4, grossly nonfocal Psychological:  Alert and cooperative. Normal mood and affect   Assessment and Recommendations:  1. Nausea, resolved. Discontinue Reglan and Carafate.  2.  Short segment Barrett's esophagus without dysplasia. GERD. Resume Nexium 20 mg po qd long term and follow antireflux measures long term. Given age and lack of dysplasia there are no plans for surveillance EGD. REV in 1 year.

## 2019-04-07 NOTE — Patient Instructions (Signed)
We have sent the following medications to your pharmacy for you to pick up at your convenience: Nexium 20 mg daily.  Normal BMI (Body Mass Index- based on height and weight) is between 23 and 30. Your BMI today is Body mass index is 23.47 kg/m. Marland Kitchen Please consider follow up  regarding your BMI with your Primary Care Provider.  Thank you for choosing me and [facility].  Pricilla Riffle. Dagoberto Ligas., MD., [facility] Regal

## 2019-04-12 NOTE — Telephone Encounter (Signed)
Paient's son called to check the status of a referral.  CB# [phone]

## 2019-04-12 NOTE — Telephone Encounter (Signed)
See note, please advise and update patient.

## 2019-04-12 NOTE — Telephone Encounter (Signed)
See note

## 2019-04-15 NOTE — Telephone Encounter (Signed)
He wasn't upset at all.  This has been taken care of.

## 2019-04-15 NOTE — Telephone Encounter (Signed)
Perfect. Thank you!

## 2019-04-15 NOTE — Telephone Encounter (Signed)
Patient's son called in stating he would like to speak with the office manager and get an update on to what is going on with his mother's referral. Pt's son is upset no one has called him back. Please advise. Call back [phone].

## 2019-04-15 NOTE — Telephone Encounter (Signed)
Colletta Maryland can you contact the patient's son to update on referral. If needed I can speak with him after but I'd like to see if you can resolve first before it escalates further.

## 2019-04-26 NOTE — Telephone Encounter (Signed)
Patient's son called in stating he would like to speak with CMA or PCP in regards to referral for psych. Pt's son stated he is needing answers as he feels like mother's mental health is going very fast.

## 2019-04-26 NOTE — Telephone Encounter (Signed)
Please double check on this referral and contact pt's son per phone note.

## 2019-04-29 ENCOUNTER — Encounter: Payer: Self-pay | Admitting: Neurology

## 2019-04-29 ENCOUNTER — Telehealth: Payer: Self-pay

## 2019-04-29 ENCOUNTER — Other Ambulatory Visit: Payer: Self-pay

## 2019-04-29 DIAGNOSIS — R4182 Altered mental status, unspecified: Secondary | ICD-10-CM

## 2019-04-29 DIAGNOSIS — R413 Other amnesia: Secondary | ICD-10-CM

## 2019-04-29 NOTE — Telephone Encounter (Signed)
Copied from [place] [id number]. Topic: General - Other >> Apr 29, 2019  3:05 PM Oneta Rack wrote: Patient son [name] states nerulogist cant see patient until January 18th. Son requesting to speak with [name] regarding alternatives, please advise (son did make appointment and was placed on specialist wait list)

## 2019-04-29 NOTE — Telephone Encounter (Signed)
You have talked to him right?

## 2019-04-29 NOTE — Telephone Encounter (Signed)
This is the one I talked to you about - does she need something besides for the psychiatry referral?  If she is declining as fast as what he is saying - I wanted to make sure she didn't need something else.  I was under the impression that someone was going to call and talk to him about what is going on.  In the meantime, I will call [facility] and see what is going on with the referral I sent.

## 2019-04-30 NOTE — Telephone Encounter (Signed)
Please advise not sure what to do with patient

## 2019-05-02 NOTE — Telephone Encounter (Signed)
Please call and obtain more information.   

## 2019-05-03 NOTE — Telephone Encounter (Signed)
Krystal Craig, can you please see if you can get pt in sooner to see Neurology. Also Aldona Bar said there is a Dr. Clarice Pole who does Cognitive evaluations. He is either at [facility] or [facility]?

## 2019-05-03 NOTE — Telephone Encounter (Signed)
This needs to be a provider to provider call / staff message.  There is nothing I can do to make Neurology happen faster.  If you want her referred to Dr Mallie Mussel in the meantime, I can do that.  I will need a Psychology referral.

## 2019-05-05 NOTE — Telephone Encounter (Signed)
Left message on voicemail to call office.  

## 2019-05-06 NOTE — Telephone Encounter (Signed)
Spoke to pt's son Krystal Craig, told him our referral coordinator contacted Neurology the other day to tell them the referral was suppose to be urgent and she was told that they would move your mother to the top of the waiting list so someone should be contacting you to hopefully move appt up sooner. Krystal Craig verbalized understanding. Told him Aldona Bar said if you like we could send a referral to a psychologist who does cognitive evaluations while waiting for Neurology? Monica Martinez said he thinks she needs Neurologist not a Engineer, water and will wait. Told him okay, if I were you I would call Neurology and let them know that it was an urgent referral and see if they will give you an appt sooner. Krystal Craig verbalized understanding.

## 2019-05-18 ENCOUNTER — Other Ambulatory Visit: Payer: Self-pay

## 2019-05-18 ENCOUNTER — Other Ambulatory Visit: Payer: Self-pay | Admitting: Family Medicine

## 2019-05-18 ENCOUNTER — Other Ambulatory Visit (INDEPENDENT_AMBULATORY_CARE_PROVIDER_SITE_OTHER): Payer: Medicare Other

## 2019-05-18 ENCOUNTER — Encounter: Payer: Self-pay | Admitting: Neurology

## 2019-05-18 ENCOUNTER — Ambulatory Visit (INDEPENDENT_AMBULATORY_CARE_PROVIDER_SITE_OTHER): Payer: Medicare Other | Admitting: Neurology

## 2019-05-18 VITALS — BP 146/70 | HR 82 | Ht 63.0 in | Wt 133.0 lb

## 2019-05-18 DIAGNOSIS — F0390 Unspecified dementia without behavioral disturbance: Secondary | ICD-10-CM

## 2019-05-18 DIAGNOSIS — R413 Other amnesia: Secondary | ICD-10-CM

## 2019-05-18 DIAGNOSIS — F03A0 Unspecified dementia, mild, without behavioral disturbance, psychotic disturbance, mood disturbance, and anxiety: Secondary | ICD-10-CM

## 2019-05-18 DIAGNOSIS — F5101 Primary insomnia: Secondary | ICD-10-CM

## 2019-05-18 MED ORDER — DONEPEZIL HCL 10 MG PO TABS: ORAL_TABLET | ORAL | 11 refills | Status: DC

## 2019-05-18 NOTE — Progress Notes (Signed)
NEUROLOGY CONSULTATION NOTE  [name] MRN: [id number] DOB: 1932/09/17  Referring provider: [name], PA Primary care provider: [name], PA  Reason for consult:  Memory loss   Thank you for your kind referral of Krystal Craig for consultation of the above symptoms. Although her history is well known to you, please allow me to reiterate it for the purpose of our medical record. The patient was accompanied to the clinic by her son Krystal Craig who also provides collateral information. Records and images were personally reviewed where available.   HISTORY OF PRESENT ILLNESS: This is a pleasant 85 year old right-handed woman with a history of hyperlipidemia, chronic back pain, presenting for evaluation of memory loss. She feels her memory is "not so good sometimes." Her son Krystal Craig is present during the visit to provide additional information and states that she is usually pretty sharp, but started having more noticeable memory changes in the past 3 months. She has had bouts of nausea in the past where she would be confused and her memory would transiently worsen, then she would get better, however last July/August she had a prolonged bout of nausea and memory/confusion has not cleared up. She would call her son 2 hours later, not remembering she had talked to him earlier. She called him a month ago because she was confused with her bills and paid some twice, he started helping her. She was getting confused with her medications and he started helping 3 months ago. She lives with her husband who has dementia, her other son manages her husband's medications because 2 weeks ago she called Krystal Craig that she was out of medications, apparently their pillboxes got switched and she had taken her husband medications, while her husband had taken 2 days of her medications. She denies leaving the stove on or misplacing things. She does not drive, she is the navigator and she and her husband got lost a  couple of months ago while he was driving. She has occasional word-finding difficulties. She is independent with dressing and bathing, no hygiene concerns. Krystal Craig notes that laundry and dishes are done. Sometimes she seems very anxious and nervous, more confused. She states mood is fine most of the time, she takes prn clonazepam for anxiety. No paranoia or hallucinations.   She has occasional mild occipital headaches that do not last long, no associated nausea/vomiting. No dizziness, diplopia, dysarthria/dysphagia, neck pain, focal numbness/tingling/weakness, bowel/bladder dysfunction, anosmia, or tremors. She has chronic back pain. She usually takes medication to help her sleep through the night, no wandering behaviors. No falls. Her sister has memory issues. She denies any history of significant head injuries or alcohol use.   Laboratory Data: Lab Results  Component Value Date   TSH 0.84 03/22/2019   Lab Results  Component Value Date   [id number] 017 03/22/2019     PAST MEDICAL HISTORY: Past Medical History:  Diagnosis Date   Allergic rhinitis    Anemia    Chest pain with normal stress test, 2013    Chronic kidney disease, stage III (moderate)    pt denies   Complication of anesthesia    Diaphragmatic hernia 06/26/2007   Diverticulosis    Hiatal hernia    History of bronchitis    IBS (irritable bowel syndrome)    Impacted cerumen of both ears    Insomnia    Lumbar back pain    Major depression in partial remission ([place])    Malignant neoplasm of bronchus and lung, 2006  Osteoarthritis    Osteopenia    PONV (postoperative nausea and vomiting)    Pre-diabetes    Psoriasis    Pure hypercholesterolemia    Vitamin D deficiency     PAST SURGICAL HISTORY: Past Surgical History:  Procedure Laterality Date   ABDOMINAL HYSTERECTOMY  1969   APPENDECTOMY     bilateral shouler operations for impingement syndromes Bilateral 2001   BUNIONECTOMY Left     CATARACT EXTRACTION, BILATERAL Bilateral 2012   CHOLECYSTECTOMY  1976   COLONOSCOPY     LAPAROSCOPIC LYSIS INTESTINAL ADHESIONS     left lower lobectomy  11/2004   via VATS and mini-thoractomy for 1.5cm adenoca of lung by Dr. Samantha Crimes LAMINECTOMY/DECOMPRESSION MICRODISCECTOMY Left 01/05/2015   Procedure: L3-4 DECOMPRESSION MICRODISCECTOMY ON LEFT;  Surgeon: Melina Schools, MD;  Location: [facility];  Service: Orthopedics;  Laterality: Left;  left Lumbar 3-4   POLYPECTOMY     turmor ear Right 09/2013   urologic sugery with cystocele/rectocele repairs and sling  03/2007   Dr. Terance Hart    MEDICATIONS: Current Outpatient Medications on File Prior to Visit  Medication Sig Dispense Refill   acetaminophen (TYLENOL ARTHRITIS PAIN) 650 MG CR tablet Take 650 mg by mouth every 8 (eight) hours as needed for pain.     aspirin 81 MG tablet Take 81 mg by mouth daily.     Calcium Carbonate-Vitamin D (CALCIUM-VITAMIN D) 500-200 MG-UNIT tablet Take 1 tablet by mouth daily.     clonazePAM (KLONOPIN) 0.5 MG tablet Take 1 tablet (0.5 mg total) by mouth 2 (two) times daily as needed. for anxiety (Patient taking differently: Take 0.5 mg by mouth daily. for anxiety) 60 tablet 2   escitalopram (LEXAPRO) 10 MG tablet Take 1 tablet (10 mg total) by mouth daily. 90 tablet 1   esomeprazole (NEXIUM) 20 MG capsule Take 1 capsule (20 mg total) by mouth daily at 12 noon. 30 capsule 11   Multiple Vitamins-Minerals (MULTIVITAMIN WITH MINERALS) tablet Take 1 tablet by mouth daily.     Probiotic Product (PROBIOTIC PO) Take 1 tablet by mouth daily.     simvastatin (ZOCOR) 40 MG tablet Take 1 tablet (40 mg total) by mouth at bedtime. 90 tablet 0   zolpidem (AMBIEN) 10 MG tablet Take 1 tablet (10 mg total) by mouth at bedtime as needed. for sleep (Patient taking differently: Take 5 mg by mouth at bedtime as needed. for sleep) 30 tablet 4   No current facility-administered medications on file prior to visit.      ALLERGIES: Allergies  Allergen Reactions   Ciprocin-Fluocin-Procin [Fluocinolone] Other (See Comments)    Leg and arm numbness   Codeine     Chest pain/tightness   Wellbutrin [Bupropion] Rash    FAMILY HISTORY: Family History  Problem Relation Age of Onset   Breast cancer Sister 87   Dementia Mother    Heart attack Mother    CVA Father    Stroke Father    Colon cancer Neg Hx    Esophageal cancer Neg Hx    Pancreatic cancer Neg Hx    Rectal cancer Neg Hx    Stomach cancer Neg Hx    Colon polyps Neg Hx     SOCIAL HISTORY: Social History   Socioeconomic History   Marital status: Married    Spouse name: Not on file   Number of children: Not on file   Years of education: Not on file   Highest education level: Not on file  Occupational  History   Not on file  Social Needs   Financial resource strain: Not on file   Food insecurity    Worry: Not on file    Inability: Not on file   Transportation needs    Medical: Not on file    Non-medical: Not on file  Tobacco Use   Smoking status: Former Smoker    Packs/day: 1.00    Years: 30.00    Pack years: 30.00    Types: Cigarettes    Quit date: 06/17/1984    Years since quitting: 34.9   Smokeless tobacco: Never Used  Substance and Sexual Activity   Alcohol use: No    Alcohol/week: 0.0 standard drinks   Drug use: No   Sexual activity: Not Currently    Birth control/protection: Craig-menopausal    Comment: 1st intercourse 75 yo-2 partners  Lifestyle   Physical activity    Days per week: Not on file    Minutes per session: Not on file   Stress: Not on file  Relationships   Social connections    Talks on phone: Not on file    Gets together: Not on file    Attends religious service: Not on file    Active member of club or organization: Not on file    Attends meetings of clubs or organizations: Not on file    Relationship status: Not on file   Intimate partner violence    Fear of  current or ex partner: Not on file    Emotionally abused: Not on file    Physically abused: Not on file    Forced sexual activity: Not on file  Other Topics Concern   Not on file  Social History Narrative   Married (2nd marriage; 1st husband died at age 23) married September 15, 1984 36 years in 05/2014. 2 sons from first marriage, 3 from 2nd (step). 15 grandchildren. 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 Hobbies: reading, golf, family time.   Krystal Craig is POA   Right handed   Lives in single story home with husband    REVIEW OF SYSTEMS: Constitutional: No fevers, chills, or sweats, no generalized fatigue, change in appetite Eyes: No visual changes, double vision, eye pain Ear, nose and throat: No hearing loss, ear pain, nasal congestion, sore throat Cardiovascular: No chest pain, palpitations Respiratory:  No shortness of breath at rest or with exertion, wheezes GastrointestinaI: No nausea, vomiting, diarrhea, abdominal pain, fecal incontinence Genitourinary:  No dysuria, urinary retention or frequency Musculoskeletal:  No neck pain, +back pain Integumentary: No rash, pruritus, skin lesions Neurological: as above Psychiatric: No depression, insomnia, anxiety Endocrine: No palpitations, fatigue, diaphoresis, mood swings, change in appetite, change in weight, increased thirst Hematologic/Lymphatic:  No anemia, purpura, petechiae. Allergic/Immunologic: no itchy/runny eyes, nasal congestion, recent allergic reactions, rashes  PHYSICAL EXAM: Vitals:   05/18/19 1254  BP: (!) 146/70  Pulse: 82  SpO2: 97%   General: No acute distress Head:  Normocephalic/atraumatic Skin/Extremities: No rash, no edema Neurological Exam: Mental status: alert and oriented to person, place, and time, no dysarthria or aphasia, Fund of knowledge is appropriate.  Recent and remote memory are impaired.  Attention and concentration are impaired. SLUMS 17/30  [employer] Mental Exam 05/18/2019  Weekday Correct 0  Current  year 1  What state are we in? 1  Amount spent 1  Amount left 0  # of Animals 2  5 objects recall 1  Number series 1  Hour markers 2  Time correct 0  Placed X  in triangle correctly 1  Largest Figure 1  Name of female 2  Date back to work 2  Type of work 2  State she lived in 0  Total score 17   Cranial nerves: CN I: not tested CN II: pupils equal, round and reactive to light, visual fields intact CN III, IV, VI:  full range of motion, no nystagmus, no ptosis CN V: facial sensation intact CN VII: upper and lower face symmetric CN VIII: hearing intact to conversation CN IX, X: gag intact, uvula midline CN XI: sternocleidomastoid and trapezius muscles intact CN XII: tongue midline Bulk & Tone: normal, no fasciculations. Motor: 5/5 throughout with no pronator drift. Sensation: intact to light touch, cold, pin, vibration and joint position sense.  No extinction to double simultaneous stimulation.  Romberg test negative Deep Tendon Reflexes: +2 throughout, no ankle clonus Plantar responses: downgoing bilaterally Cerebellar: no incoordination on finger to nose testing Gait: narrow-based and steady, able to tandem walk adequately. Tremor: none  IMPRESSION: This is a pleasant 85 year old right-handed woman with a history of hyperlipidemia, chronic back pain, presenting for evaluation of memory loss. Her neurological exam is non-focal, SLUMS score today 17/30. It appears symptoms started rather suddenly last July/August, now unable to perform complex tasks such as managing medications/finances. MRI brain with and without contrast will be ordered to assess for underlying structural abnormality. Bloodwork for reversible causes of dementia will be ordered. Neurocognitive testing will be ordered to further evaluate memory concerns. She is agreeable to starting Donepezil, side effects and expectations from medication were discussed. Start Donepezil 10mg  1/2 tablet daily for 2 weeks, then  increase to 1 tablet daily. Continue close supervision. She does not drive. Follow-up in 6 months, they know to call for any changes.   Thank you for allowing me to participate in the care of this patient. Please do not hesitate to call for any questions or concerns.   Ellouise Newer, M.D.  CC: [name], [place]

## 2019-05-18 NOTE — Patient Instructions (Signed)
[id number] 

## 2019-05-18 NOTE — Patient Instructions (Signed)
1. Bloodwork for RPR, ammonia, ESR, CRP, ANA, anti-thyroglobulin antibodies, anti-thyroid peroxidase antibodies  2. Schedule MRI brain with and without contrast  3. Schedule Neurocognitive testing   4. Start Donepezil 30m: take 1/2 tablet daily for 2 weeks, then increase to 1 tablet daily  5. Follow-up in 6 months, call for any changes  FALL PRECAUTIONS: Be cautious when walking. Scan the area for obstacles that may increase the risk of trips and falls. When getting up in the mornings, sit up at the edge of the bed for a few minutes before getting out of bed. Consider elevating the bed at the head end to avoid drop of blood pressure when getting up. Walk always in a well-lit room (use night lights in the walls). Avoid area rugs or power cords from appliances in the middle of the walkways. Use a walker or a cane if necessary and consider physical therapy for balance exercise. Get your eyesight checked regularly.  FINANCIAL OVERSIGHT: Supervision, especially oversight when making financial decisions or transactions is also recommended.  HOME SAFETY: Consider the safety of the kitchen when operating appliances like stoves, microwave oven, and blender. Consider having supervision and share cooking responsibilities until no longer able to participate in those. Accidents with firearms and other hazards in the house should be identified and addressed as well.  DRIVING: Regarding driving, in patients with progressive memory problems, driving will be impaired. We advise to have someone else do the driving if trouble finding directions or if minor accidents are reported. Independent driving assessment is available to determine safety of driving.  ABILITY TO BE LEFT ALONE: If patient is unable to contact 911 operator, consider using LifeLine, or when the need is there, arrange for someone to stay with patients. Smoking is a fire hazard, consider supervision or cessation. Risk of wandering should be assessed  by caregiver and if detected at any point, supervision and safe proof recommendations should be instituted.  MEDICATION SUPERVISION: Inability to self-administer medication needs to be constantly addressed. Implement a mechanism to ensure safe administration of the medications.  RECOMMENDATIONS FOR ALL PATIENTS WITH MEMORY PROBLEMS: 1. Continue to exercise (Recommend 30 minutes of walking everyday, or 3 hours every week) 2. Increase social interactions - [place] with friends and family 3. Eat healthy, avoid fried foods and eat more fruits and vegetables 4. Maintain adequate blood pressure, blood sugar, and blood cholesterol level. Reducing the risk of stroke and cardiovascular disease also helps promoting better memory. 5. Avoid stressful situations. Live a simple life and avoid aggravations. Organize your time and prepare for the next day in anticipation. 6. Sleep well, avoid any interruptions of sleep and avoid any distractions in the bedroom that may interfere with adequate sleep quality 7. Avoid sugar, avoid sweets as there is a strong link between excessive sugar intake, diabetes, and cognitive impairment The Mediterranean diet has been shown to help patients reduce the risk of progressive memory disorders and reduces cardiovascular risk. This includes eating fish, eat fruits and green leafy vegetables, nuts like almonds and hazelnuts, walnuts, and also use olive oil. Avoid fast foods and fried foods as much as possible. Avoid sweets and sugar as sugar use has been linked to worsening of memory function.  There is always a concern of gradual progression of memory problems. If this is the case, then we may need to adjust level of care according to patient needs. Support, both to the patient and caregiver, should then be put  into place.

## 2019-05-19 LAB — RPR: RPR Ser Ql: NONREACTIVE

## 2019-05-19 NOTE — Telephone Encounter (Signed)
Last OV 03/22/19 - Dr. Juleen China Last refill 01/06/19 # 60/2 Next OV 06/23/19 [name]

## 2019-05-20 LAB — THYROID PEROXIDASE ANTIBODY: Thyroperoxidase Ab SerPl-aCnc: <1 IU/mL (ref <9)

## 2019-05-20 LAB — AMMONIA: Ammonia: 25 umol/L (ref <=72)

## 2019-05-20 LAB — THYROGLOBULIN LEVEL: Thyroglobulin: 7.8 ng/mL

## 2019-05-20 LAB — SEDIMENTATION RATE: Sed Rate: 6 mm/h (ref 0–30)

## 2019-05-20 LAB — C-REACTIVE PROTEIN: CRP: 0.9 mg/L (ref <8.0)

## 2019-05-20 LAB — ANA: Anti Nuclear Antibody (ANA): NEGATIVE

## 2019-05-26 ENCOUNTER — Telehealth: Payer: Self-pay | Admitting: Neurology

## 2019-05-26 NOTE — Telephone Encounter (Signed)
Patient's son called in to check the Status of her MRI being scheduled? Please Call. Thanks

## 2019-05-26 NOTE — Telephone Encounter (Signed)
Order is in [place]. Son believes [facility] did call but pt could not remember. An appt is not in epic. Number given to [place] and son will call and schedule the MRI.

## 2019-06-14 ENCOUNTER — Other Ambulatory Visit: Payer: Self-pay | Admitting: Physician Assistant

## 2019-06-14 ENCOUNTER — Other Ambulatory Visit: Payer: Self-pay | Admitting: Family Medicine

## 2019-06-14 DIAGNOSIS — G894 Chronic pain syndrome: Secondary | ICD-10-CM

## 2019-06-14 DIAGNOSIS — F5101 Primary insomnia: Secondary | ICD-10-CM

## 2019-06-22 ENCOUNTER — Other Ambulatory Visit: Payer: Self-pay

## 2019-06-23 ENCOUNTER — Ambulatory Visit (INDEPENDENT_AMBULATORY_CARE_PROVIDER_SITE_OTHER): Payer: Medicare Other | Admitting: Physician Assistant

## 2019-06-23 ENCOUNTER — Encounter: Payer: Self-pay | Admitting: Physician Assistant

## 2019-06-23 VITALS — BP 148/72 | HR 68 | Temp 97.2°F | Ht 63.0 in | Wt 133.5 lb

## 2019-06-23 DIAGNOSIS — R413 Other amnesia: Secondary | ICD-10-CM | POA: Diagnosis not present

## 2019-06-23 DIAGNOSIS — G4700 Insomnia, unspecified: Secondary | ICD-10-CM | POA: Diagnosis not present

## 2019-06-23 NOTE — Patient Instructions (Signed)
It was great to see you!  Let's hold the ambien for now, please remove from the house. May take clonopin up to once daily, please portion out as you are doing so. You will be contacted about the [place] within a week or two.  Keep close follow-up with Dr. Delice Lesch for her memory.  Let's follow-up in 3-6 months, sooner if you have concerns.  Take care,  [name] PA-C

## 2019-06-23 NOTE — Progress Notes (Signed)
Krystal Craig is a 86 y.o. female is here for Transfer of care.  I acted as a Education administrator for [employer], PA-C Anselmo Pickler, LPN  History of Present Illness:   Chief Complaint  Patient presents with  . Transfer of care    HPI   Pt is here today for transfer of care from Dr. Juleen China.  Memory changes: saw Dr. Delice Lesch on 05/18/19, labs were performed and were all normal, MRI pending and to be completed this weekend, and she was started on Aricept. Son, Monica Martinez, who is present for discussion, reports that he has not really noticed much improvement with the medication addition. He has also noted that she is having issues with remembering to take medication; he went to check her pill box and not all of the correct days had been taken. He fills her pills q week.  Insomnia: denies any issues presently. She has ambien 10 mg in her bedside table to take prn, as well as clonazepam 0.5 mg daily. She is unable to tell me how often she is taking these medications.   There are no preventive care reminders to display for this patient.  Past Medical History:  Diagnosis Date  . Allergic rhinitis   . Anemia   . Chest pain with normal stress test, 2013   . Chronic kidney disease, stage III (moderate)    pt denies  . Complication of anesthesia   . Diaphragmatic hernia 06/26/2007  . Diverticulosis   . Hiatal hernia   . History of bronchitis   . IBS (irritable bowel syndrome)   . Impacted cerumen of both ears   . Insomnia   . Lumbar back pain   . Major depression in partial remission ([place])   . Malignant neoplasm of bronchus and lung, 2006   . Osteoarthritis   . Osteopenia   . PONV (postoperative nausea and vomiting)   . Pre-diabetes   . Psoriasis   . Pure hypercholesterolemia   . Vitamin D deficiency      Social History   Socioeconomic History  . Marital status: Married    Spouse name: Not on file  . Number of children: Not on file  . Years of education: Not on file  . Highest  education level: Not on file  Occupational History  . Not on file  Tobacco Use  . Smoking status: Former Smoker    Packs/day: 1.00    Years: 30.00    Pack years: 30.00    Types: Cigarettes    Quit date: 06/17/1984    Years since quitting: 35.0  . Smokeless tobacco: Never Used  Substance and Sexual Activity  . Alcohol use: No    Alcohol/week: 0.0 standard drinks  . Drug use: No  . Sexual activity: Not Currently    Birth control/protection: Post-menopausal    Comment: 1st intercourse 17 yo-2 partners  Other Topics Concern  . Not on file  Social History Narrative   Married (2nd marriage; 1st husband died at age 72) married 5 29 years in 05/2014. 2 sons from first marriage, 3 from 2nd (step). 15 grandchildren. 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 Hobbies: reading, golf, family time.   [name] is POA   Right handed   Lives in single story home with husband   Social Determinants of Health   Financial Resource Strain:   . Difficulty of Paying Living Expenses: Not on file  Food Insecurity:   . Worried About Charity fundraiser in the Last Year:  Not on file  . Ran Out of Food in the Last Year: Not on file  Transportation Needs:   . Lack of Transportation (Medical): Not on file  . Lack of Transportation (Non-Medical): Not on file  Physical Activity:   . Days of Exercise per Week: Not on file  . Minutes of Exercise per Session: Not on file  Stress:   . Feeling of Stress : Not on file  Social Connections:   . Frequency of Communication with Friends and Family: Not on file  . Frequency of Social Gatherings with Friends and Family: Not on file  . Attends Religious Services: Not on file  . Active Member of Clubs or Organizations: Not on file  . Attends Archivist Meetings: Not on file  . Marital Status: Not on file  Intimate Partner Violence:   . Fear of Current or Ex-Partner: Not on file  . Emotionally Abused: Not on file  . Physically Abused: Not on file  . Sexually  Abused: Not on file    Past Surgical History:  Procedure Laterality Date  . ABDOMINAL HYSTERECTOMY  1969  . APPENDECTOMY    . bilateral shouler operations for impingement syndromes Bilateral 2001  . BUNIONECTOMY Left   . CATARACT EXTRACTION, BILATERAL Bilateral 2012  . CHOLECYSTECTOMY  1976  . COLONOSCOPY    . LAPAROSCOPIC LYSIS INTESTINAL ADHESIONS    . left lower lobectomy  11/2004   via VATS and mini-thoractomy for 1.5cm adenoca of lung by Dr. Arlyce Dice  . LUMBAR LAMINECTOMY/DECOMPRESSION MICRODISCECTOMY Left 01/05/2015   Procedure: L3-4 DECOMPRESSION MICRODISCECTOMY ON LEFT;  Surgeon: Melina Schools, MD;  Location: [facility];  Service: Orthopedics;  Laterality: Left;  left Lumbar 3-4  . POLYPECTOMY    . turmor ear Right 09/2013  . urologic sugery with cystocele/rectocele repairs and sling  03/2007   Dr. Terance Hart    Family History  Problem Relation Age of Onset  . Breast cancer Sister 37  . Dementia Mother   . Heart attack Mother   . CVA Father   . Stroke Father   . Colon cancer Neg Hx   . Esophageal cancer Neg Hx   . Pancreatic cancer Neg Hx   . Rectal cancer Neg Hx   . Stomach cancer Neg Hx   . Colon polyps Neg Hx     PMHx, SurgHx, SocialHx, FamHx, Medications, and Allergies were reviewed in the Visit Navigator and updated as appropriate.   Patient Active Problem List   Diagnosis Date Noted  . Memory change 06/23/2019  . Barrett's esophagus with dysplasia 04/07/2019  . Age-related osteoporosis without current pathological fracture, does not want treatment 05/31/2018  . Insulin resistance 05/31/2018  . Abnormal findings on diagnostic imaging of other abdominal regions, liver and pancreas 05/31/2018  . Aortic atherosclerosis ([place]) 05/26/2018  . Degenerative arthritis of lumbar spine 05/26/2018  . Situational depression 12/22/2017  . Diverticulosis 11/11/2016  . Hx of cancer of lung, s/p left lobectomy 2006 11/11/2016  . Situational anxiety 11/06/2016  . Primary  osteoarthritis of both first carpometacarpal joints 12/28/2015  . Microscopic hematuria, with previous evaluation by Urology and determined to be benign 09/12/2014  . Psoriasis, followed by [facility] 02/28/2014  . CKD (chronic kidney disease), stage III 02/28/2014  . Thoracic lymphadenopathy 09/25/2011  . Hyperlipidemia, Rx Zocor 09/25/2011  . Insomnia, takes 5 mg Ambien prn 08/03/2008  . GERD 06/26/2007  . Irritable bowel syndrome 06/26/2007    Social History   Tobacco Use  . Smoking status:  Former Smoker    Packs/day: 1.00    Years: 30.00    Pack years: 30.00    Types: Cigarettes    Quit date: 06/17/1984    Years since quitting: 35.0  . Smokeless tobacco: Never Used  Substance Use Topics  . Alcohol use: No    Alcohol/week: 0.0 standard drinks  . Drug use: No    Current Medications and Allergies:    Current Outpatient Medications:  .  acetaminophen (TYLENOL ARTHRITIS PAIN) 650 MG CR tablet, Take 650 mg by mouth every 8 (eight) hours as needed for pain., Disp: , Rfl:  .  aspirin 81 MG tablet, Take 81 mg by mouth daily., Disp: , Rfl:  .  Calcium Carbonate-Vitamin D (CALCIUM-VITAMIN D) 500-200 MG-UNIT tablet, Take 1 tablet by mouth daily., Disp: , Rfl:  .  clonazePAM (KLONOPIN) 0.5 MG tablet, TAKE 1 TABLET BY MOUTH ONCE DAILY FOR ANXIETY, Disp: 30 tablet, Rfl: 0 .  donepezil (ARICEPT) 10 MG tablet, Take 1/2 tablet for 2 weeks, then increase to 1 tablet daily (Patient taking differently: Take 10 mg by mouth daily. ), Disp: 30 tablet, Rfl: 11 .  escitalopram (LEXAPRO) 10 MG tablet, Take 1 tablet by mouth once daily, Disp: 90 tablet, Rfl: 0 .  esomeprazole (NEXIUM) 20 MG capsule, Take 1 capsule (20 mg total) by mouth daily at 12 noon., Disp: 30 capsule, Rfl: 11 .  Multiple Vitamins-Minerals (MULTIVITAMIN WITH MINERALS) tablet, Take 1 tablet by mouth daily., Disp: , Rfl:  .  Probiotic Product (PROBIOTIC PO), Take 1 tablet by mouth daily., Disp: , Rfl:  .  simvastatin (ZOCOR) 40  MG tablet, Take 1 tablet (40 mg total) by mouth at bedtime., Disp: 90 tablet, Rfl: 0 .  zolpidem (AMBIEN) 10 MG tablet, Take 1 tablet (10 mg total) by mouth at bedtime as needed. for sleep (Patient taking differently: Take 5 mg by mouth at bedtime as needed. for sleep), Disp: 30 tablet, Rfl: 4   Allergies  Allergen Reactions  . Ciprocin-Fluocin-Procin [Fluocinolone] Other (See Comments)    Leg and arm numbness  . Codeine     Chest pain/tightness  . Wellbutrin [Bupropion] Rash    Review of Systems   ROS  Negative unless otherwise specified per HPI.  Vitals:   Vitals:   06/23/19 1028  BP: (!) 148/72  Pulse: 68  Temp: (!) 97.2 F (36.2 C)  TempSrc: Temporal  SpO2: 96%  Weight: 133 lb 8 oz (60.6 kg)  Height: 5\' 3"  (1.6 m)     Body mass index is 23.65 kg/m.   Physical Exam:    Physical Exam Vitals and nursing note reviewed.  Constitutional:      General: She is not in acute distress.    Appearance: She is well-developed. She is not ill-appearing or toxic-appearing.  Cardiovascular:     Rate and Rhythm: Normal rate and regular rhythm.     Pulses: Normal pulses.     Heart sounds: Normal heart sounds, S1 normal and S2 normal.     Comments: No LE edema Pulmonary:     Effort: Pulmonary effort is normal.     Breath sounds: Normal breath sounds.  Skin:    General: Skin is warm and dry.  Neurological:     Mental Status: She is alert.     GCS: GCS eye subscore is 4. GCS verbal subscore is 5. GCS motor subscore is 6.  Psychiatric:        Speech: Speech normal.  Behavior: Behavior normal. Behavior is cooperative.      Assessment and Plan:    Jenni was seen today for transfer of care.  Diagnoses and all orders for this visit:  Memory change Discussed at length with patient and son. Regardless of MRI results, patient and son need to follow-up with closely with Dr. Delice Lesch regarding their concerns of her memory. Will put in [place] referral for nursing to  see if we can get some help better managing her medications to help out her son.  -     Ambulatory referral to Home Health  Insomnia, unspecified type Stop Ambien -- she denies needing or taking it and I think with her medication non-compliance 2/2 memory issues it is better to not have this medication at her disposal. Follow-up in 3 months, sooner if concerns.   . Reviewed expectations re: course of current medical issues. . Discussed self-management of symptoms. . Outlined signs and symptoms indicating need for more acute intervention. . Patient verbalized understanding and all questions were answered. . See orders for this visit as documented in the electronic medical record. . Patient received an After Visit Summary.  CMA or LPN served as scribe during this visit. History, Physical, and Plan performed by medical provider. The above documentation has been reviewed and is accurate and complete.  This appointment required 40 minutes of patient care (this includes precharting, chart review, review of results, face-to-face care, etc.).  [name], PA-C [place], [place] 06/23/2019  Follow-up: No follow-ups on file.

## 2019-06-26 ENCOUNTER — Other Ambulatory Visit: Payer: Self-pay

## 2019-06-26 ENCOUNTER — Ambulatory Visit
Admission: RE | Admit: 2019-06-26 | Discharge: 2019-06-26 | Disposition: A | Discharge status: Home / Self Care | Payer: Medicare Other | Source: Ambulatory Visit | Attending: Neurology | Admitting: Neurology

## 2019-06-26 DIAGNOSIS — F0390 Unspecified dementia without behavioral disturbance: Secondary | ICD-10-CM

## 2019-06-26 DIAGNOSIS — R413 Other amnesia: Secondary | ICD-10-CM | POA: Diagnosis not present

## 2019-06-26 DIAGNOSIS — F03A0 Unspecified dementia, mild, without behavioral disturbance, psychotic disturbance, mood disturbance, and anxiety: Secondary | ICD-10-CM

## 2019-06-26 MED ORDER — GADOBENATE DIMEGLUMINE 529 MG/ML IV SOLN
12.0000 mL | Freq: Once | INTRAVENOUS | Status: AC | PRN
  Administered 2019-06-26: 12 mL via INTRAVENOUS

## 2019-06-29 ENCOUNTER — Telehealth: Payer: Self-pay

## 2019-06-29 DIAGNOSIS — Z902 Acquired absence of lung [part of]: Secondary | ICD-10-CM | POA: Diagnosis not present

## 2019-06-29 DIAGNOSIS — M47816 Spondylosis without myelopathy or radiculopathy, lumbar region: Secondary | ICD-10-CM | POA: Diagnosis not present

## 2019-06-29 DIAGNOSIS — Z87891 Personal history of nicotine dependence: Secondary | ICD-10-CM | POA: Diagnosis not present

## 2019-06-29 DIAGNOSIS — N183 Chronic kidney disease, stage 3 unspecified: Secondary | ICD-10-CM | POA: Diagnosis not present

## 2019-06-29 DIAGNOSIS — Z85118 Personal history of other malignant neoplasm of bronchus and lung: Secondary | ICD-10-CM | POA: Diagnosis not present

## 2019-06-29 DIAGNOSIS — G4700 Insomnia, unspecified: Secondary | ICD-10-CM | POA: Diagnosis not present

## 2019-06-29 DIAGNOSIS — Z7982 Long term (current) use of aspirin: Secondary | ICD-10-CM | POA: Diagnosis not present

## 2019-06-29 DIAGNOSIS — K22719 Barrett's esophagus with dysplasia, unspecified: Secondary | ICD-10-CM | POA: Diagnosis not present

## 2019-06-29 DIAGNOSIS — K219 Gastro-esophageal reflux disease without esophagitis: Secondary | ICD-10-CM | POA: Diagnosis not present

## 2019-06-29 DIAGNOSIS — M810 Age-related osteoporosis without current pathological fracture: Secondary | ICD-10-CM | POA: Diagnosis not present

## 2019-06-29 NOTE — Telephone Encounter (Signed)
Spoke with son Monica Martinez about MRI results.  He states that the MRI was done due to pt having worsening dementia. Is that what the scan showed?  Pt is scheduled with Dr. Melvyn Novas. She is taking Donepezil 10mg  qd.  I did read the report to him. Specifically about the atrophy. Son just wants to know if there is anything else Dr. Delice Lesch would like to mention.  Ok to message him through [place].

## 2019-06-29 NOTE — Telephone Encounter (Signed)
-----   Message from Cameron Sprang, MD sent at 06/28/2019  9:11 AM EST ----- Pls let son know that the MRI brain did not show any evidence of tumor, stroke, or bleed. It showed age-related changes. Thanks

## 2019-06-30 ENCOUNTER — Telehealth: Payer: Self-pay | Admitting: Physician Assistant

## 2019-06-30 NOTE — Telephone Encounter (Signed)
Called Hope at [facility] and verbal orders given for [facility], okay per [place]. Hope verbalized understanding.

## 2019-06-30 NOTE — Telephone Encounter (Signed)
Hope from [facility] called to get Verbal orders  Approval for Home health. Please advise.

## 2019-07-01 DIAGNOSIS — Z902 Acquired absence of lung [part of]: Secondary | ICD-10-CM | POA: Diagnosis not present

## 2019-07-01 DIAGNOSIS — M47816 Spondylosis without myelopathy or radiculopathy, lumbar region: Secondary | ICD-10-CM | POA: Diagnosis not present

## 2019-07-01 DIAGNOSIS — G4700 Insomnia, unspecified: Secondary | ICD-10-CM | POA: Diagnosis not present

## 2019-07-01 DIAGNOSIS — K219 Gastro-esophageal reflux disease without esophagitis: Secondary | ICD-10-CM | POA: Diagnosis not present

## 2019-07-01 DIAGNOSIS — K22719 Barrett's esophagus with dysplasia, unspecified: Secondary | ICD-10-CM | POA: Diagnosis not present

## 2019-07-01 DIAGNOSIS — Z85118 Personal history of other malignant neoplasm of bronchus and lung: Secondary | ICD-10-CM | POA: Diagnosis not present

## 2019-07-01 DIAGNOSIS — Z87891 Personal history of nicotine dependence: Secondary | ICD-10-CM | POA: Diagnosis not present

## 2019-07-01 DIAGNOSIS — Z7982 Long term (current) use of aspirin: Secondary | ICD-10-CM | POA: Diagnosis not present

## 2019-07-01 DIAGNOSIS — M810 Age-related osteoporosis without current pathological fracture: Secondary | ICD-10-CM | POA: Diagnosis not present

## 2019-07-01 DIAGNOSIS — N183 Chronic kidney disease, stage 3 unspecified: Secondary | ICD-10-CM | POA: Diagnosis not present

## 2019-07-05 ENCOUNTER — Ambulatory Visit: Payer: Medicare Other | Admitting: Neurology

## 2019-07-05 DIAGNOSIS — G4700 Insomnia, unspecified: Secondary | ICD-10-CM | POA: Diagnosis not present

## 2019-07-05 DIAGNOSIS — Z85118 Personal history of other malignant neoplasm of bronchus and lung: Secondary | ICD-10-CM | POA: Diagnosis not present

## 2019-07-05 DIAGNOSIS — Z87891 Personal history of nicotine dependence: Secondary | ICD-10-CM | POA: Diagnosis not present

## 2019-07-05 DIAGNOSIS — N183 Chronic kidney disease, stage 3 unspecified: Secondary | ICD-10-CM | POA: Diagnosis not present

## 2019-07-05 DIAGNOSIS — M47816 Spondylosis without myelopathy or radiculopathy, lumbar region: Secondary | ICD-10-CM | POA: Diagnosis not present

## 2019-07-05 DIAGNOSIS — Z902 Acquired absence of lung [part of]: Secondary | ICD-10-CM | POA: Diagnosis not present

## 2019-07-05 DIAGNOSIS — M810 Age-related osteoporosis without current pathological fracture: Secondary | ICD-10-CM | POA: Diagnosis not present

## 2019-07-05 DIAGNOSIS — K219 Gastro-esophageal reflux disease without esophagitis: Secondary | ICD-10-CM | POA: Diagnosis not present

## 2019-07-05 DIAGNOSIS — K22719 Barrett's esophagus with dysplasia, unspecified: Secondary | ICD-10-CM | POA: Diagnosis not present

## 2019-07-05 DIAGNOSIS — Z7982 Long term (current) use of aspirin: Secondary | ICD-10-CM | POA: Diagnosis not present

## 2019-07-07 ENCOUNTER — Telehealth: Payer: Self-pay | Admitting: *Deleted

## 2019-07-07 DIAGNOSIS — K22719 Barrett's esophagus with dysplasia, unspecified: Secondary | ICD-10-CM | POA: Diagnosis not present

## 2019-07-07 DIAGNOSIS — Z87891 Personal history of nicotine dependence: Secondary | ICD-10-CM

## 2019-07-07 DIAGNOSIS — Z85118 Personal history of other malignant neoplasm of bronchus and lung: Secondary | ICD-10-CM

## 2019-07-07 DIAGNOSIS — Z7982 Long term (current) use of aspirin: Secondary | ICD-10-CM

## 2019-07-07 DIAGNOSIS — Z902 Acquired absence of lung [part of]: Secondary | ICD-10-CM

## 2019-07-07 DIAGNOSIS — M810 Age-related osteoporosis without current pathological fracture: Secondary | ICD-10-CM | POA: Diagnosis not present

## 2019-07-07 DIAGNOSIS — G4700 Insomnia, unspecified: Secondary | ICD-10-CM | POA: Diagnosis not present

## 2019-07-07 DIAGNOSIS — N183 Chronic kidney disease, stage 3 unspecified: Secondary | ICD-10-CM | POA: Diagnosis not present

## 2019-07-07 DIAGNOSIS — K219 Gastro-esophageal reflux disease without esophagitis: Secondary | ICD-10-CM

## 2019-07-07 DIAGNOSIS — F0390 Unspecified dementia without behavioral disturbance: Secondary | ICD-10-CM | POA: Diagnosis not present

## 2019-07-07 DIAGNOSIS — M47816 Spondylosis without myelopathy or radiculopathy, lumbar region: Secondary | ICD-10-CM

## 2019-07-07 DIAGNOSIS — F418 Other specified anxiety disorders: Secondary | ICD-10-CM

## 2019-07-07 NOTE — Telephone Encounter (Signed)
Krystal Craig calling from [facility] asking for verbal order to delay speech therapy evaluation till next week per son's request. Told Krystal Craig that is fine, okay to delay. Krystal Craig verbalized understanding.

## 2019-07-08 ENCOUNTER — Telehealth: Payer: Self-pay | Admitting: Physician Assistant

## 2019-07-08 DIAGNOSIS — Z87891 Personal history of nicotine dependence: Secondary | ICD-10-CM | POA: Diagnosis not present

## 2019-07-08 DIAGNOSIS — K219 Gastro-esophageal reflux disease without esophagitis: Secondary | ICD-10-CM | POA: Diagnosis not present

## 2019-07-08 DIAGNOSIS — Z85118 Personal history of other malignant neoplasm of bronchus and lung: Secondary | ICD-10-CM | POA: Diagnosis not present

## 2019-07-08 DIAGNOSIS — Z902 Acquired absence of lung [part of]: Secondary | ICD-10-CM | POA: Diagnosis not present

## 2019-07-08 DIAGNOSIS — M47816 Spondylosis without myelopathy or radiculopathy, lumbar region: Secondary | ICD-10-CM | POA: Diagnosis not present

## 2019-07-08 DIAGNOSIS — G4700 Insomnia, unspecified: Secondary | ICD-10-CM | POA: Diagnosis not present

## 2019-07-08 DIAGNOSIS — Z7982 Long term (current) use of aspirin: Secondary | ICD-10-CM | POA: Diagnosis not present

## 2019-07-08 DIAGNOSIS — N183 Chronic kidney disease, stage 3 unspecified: Secondary | ICD-10-CM | POA: Diagnosis not present

## 2019-07-08 DIAGNOSIS — K22719 Barrett's esophagus with dysplasia, unspecified: Secondary | ICD-10-CM | POA: Diagnosis not present

## 2019-07-08 DIAGNOSIS — M810 Age-related osteoporosis without current pathological fracture: Secondary | ICD-10-CM | POA: Diagnosis not present

## 2019-07-08 NOTE — Telephone Encounter (Signed)
[place] called back saying she needs an order to discontinue Occupational Therapy due to son says not needed at this time. Told her that is fine, sorry did not realize I needed to call back and give you an order to stop Occupational Therapy. Sharyn Lull verbalized understanding.

## 2019-07-08 NOTE — Telephone Encounter (Signed)
FYI, see message. 

## 2019-07-08 NOTE — Telephone Encounter (Signed)
Sharyn Lull is calling in from [facility] and she is occupational therapist and she called the patient to do an evaluation but son states that his mother did not need it at this time and felt like she is modified for supervised self care.

## 2019-07-12 ENCOUNTER — Telehealth: Payer: Self-pay | Admitting: Physician Assistant

## 2019-07-12 DIAGNOSIS — K22719 Barrett's esophagus with dysplasia, unspecified: Secondary | ICD-10-CM | POA: Diagnosis not present

## 2019-07-12 DIAGNOSIS — Z7982 Long term (current) use of aspirin: Secondary | ICD-10-CM | POA: Diagnosis not present

## 2019-07-12 DIAGNOSIS — M47816 Spondylosis without myelopathy or radiculopathy, lumbar region: Secondary | ICD-10-CM | POA: Diagnosis not present

## 2019-07-12 DIAGNOSIS — M810 Age-related osteoporosis without current pathological fracture: Secondary | ICD-10-CM | POA: Diagnosis not present

## 2019-07-12 DIAGNOSIS — Z87891 Personal history of nicotine dependence: Secondary | ICD-10-CM | POA: Diagnosis not present

## 2019-07-12 DIAGNOSIS — K219 Gastro-esophageal reflux disease without esophagitis: Secondary | ICD-10-CM | POA: Diagnosis not present

## 2019-07-12 DIAGNOSIS — G4700 Insomnia, unspecified: Secondary | ICD-10-CM | POA: Diagnosis not present

## 2019-07-12 DIAGNOSIS — Z902 Acquired absence of lung [part of]: Secondary | ICD-10-CM | POA: Diagnosis not present

## 2019-07-12 DIAGNOSIS — Z85118 Personal history of other malignant neoplasm of bronchus and lung: Secondary | ICD-10-CM | POA: Diagnosis not present

## 2019-07-12 DIAGNOSIS — N183 Chronic kidney disease, stage 3 unspecified: Secondary | ICD-10-CM | POA: Diagnosis not present

## 2019-07-12 NOTE — Telephone Encounter (Signed)
[name] from [facility] called requesting verbal orders for speech therapy. 1 x 1 week and 2 x 3 weeks for cognitive communication deficits. Please return call to [place] at [phone] and there is a confidential voicemail set up.

## 2019-07-13 ENCOUNTER — Encounter: Payer: Self-pay | Admitting: Physician Assistant

## 2019-07-13 NOTE — Telephone Encounter (Signed)
Called [name] with [facility] at [phone], verbal orders given for speech therapy. 1 x 1 week and 2 x 3 weeks for cognitive communication deficits for pt, okay per [place]. Any questions call office.

## 2019-07-14 ENCOUNTER — Ambulatory Visit: Payer: Medicare Other

## 2019-07-14 ENCOUNTER — Other Ambulatory Visit: Payer: Self-pay | Admitting: Physician Assistant

## 2019-07-14 DIAGNOSIS — F5101 Primary insomnia: Secondary | ICD-10-CM

## 2019-07-14 MED ORDER — CLONAZEPAM 0.5 MG PO TABS: 0.5000 mg | ORAL_TABLET | Freq: Two times a day (BID) | ORAL | 1 refills | Status: DC | PRN

## 2019-07-15 DIAGNOSIS — Z7982 Long term (current) use of aspirin: Secondary | ICD-10-CM | POA: Diagnosis not present

## 2019-07-15 DIAGNOSIS — N183 Chronic kidney disease, stage 3 unspecified: Secondary | ICD-10-CM | POA: Diagnosis not present

## 2019-07-15 DIAGNOSIS — G4700 Insomnia, unspecified: Secondary | ICD-10-CM | POA: Diagnosis not present

## 2019-07-15 DIAGNOSIS — M810 Age-related osteoporosis without current pathological fracture: Secondary | ICD-10-CM | POA: Diagnosis not present

## 2019-07-15 DIAGNOSIS — M47816 Spondylosis without myelopathy or radiculopathy, lumbar region: Secondary | ICD-10-CM | POA: Diagnosis not present

## 2019-07-15 DIAGNOSIS — Z87891 Personal history of nicotine dependence: Secondary | ICD-10-CM | POA: Diagnosis not present

## 2019-07-15 DIAGNOSIS — K219 Gastro-esophageal reflux disease without esophagitis: Secondary | ICD-10-CM | POA: Diagnosis not present

## 2019-07-15 DIAGNOSIS — K22719 Barrett's esophagus with dysplasia, unspecified: Secondary | ICD-10-CM | POA: Diagnosis not present

## 2019-07-15 DIAGNOSIS — Z902 Acquired absence of lung [part of]: Secondary | ICD-10-CM | POA: Diagnosis not present

## 2019-07-15 DIAGNOSIS — Z85118 Personal history of other malignant neoplasm of bronchus and lung: Secondary | ICD-10-CM | POA: Diagnosis not present

## 2019-07-16 DIAGNOSIS — K22719 Barrett's esophagus with dysplasia, unspecified: Secondary | ICD-10-CM | POA: Diagnosis not present

## 2019-07-16 DIAGNOSIS — Z902 Acquired absence of lung [part of]: Secondary | ICD-10-CM | POA: Diagnosis not present

## 2019-07-16 DIAGNOSIS — M810 Age-related osteoporosis without current pathological fracture: Secondary | ICD-10-CM | POA: Diagnosis not present

## 2019-07-16 DIAGNOSIS — G4700 Insomnia, unspecified: Secondary | ICD-10-CM | POA: Diagnosis not present

## 2019-07-16 DIAGNOSIS — Z85118 Personal history of other malignant neoplasm of bronchus and lung: Secondary | ICD-10-CM | POA: Diagnosis not present

## 2019-07-16 DIAGNOSIS — Z7982 Long term (current) use of aspirin: Secondary | ICD-10-CM | POA: Diagnosis not present

## 2019-07-16 DIAGNOSIS — M47816 Spondylosis without myelopathy or radiculopathy, lumbar region: Secondary | ICD-10-CM | POA: Diagnosis not present

## 2019-07-16 DIAGNOSIS — Z87891 Personal history of nicotine dependence: Secondary | ICD-10-CM | POA: Diagnosis not present

## 2019-07-16 DIAGNOSIS — N183 Chronic kidney disease, stage 3 unspecified: Secondary | ICD-10-CM | POA: Diagnosis not present

## 2019-07-16 DIAGNOSIS — K219 Gastro-esophageal reflux disease without esophagitis: Secondary | ICD-10-CM | POA: Diagnosis not present

## 2019-07-20 DIAGNOSIS — M47816 Spondylosis without myelopathy or radiculopathy, lumbar region: Secondary | ICD-10-CM | POA: Diagnosis not present

## 2019-07-20 DIAGNOSIS — Z87891 Personal history of nicotine dependence: Secondary | ICD-10-CM | POA: Diagnosis not present

## 2019-07-20 DIAGNOSIS — M810 Age-related osteoporosis without current pathological fracture: Secondary | ICD-10-CM | POA: Diagnosis not present

## 2019-07-20 DIAGNOSIS — N183 Chronic kidney disease, stage 3 unspecified: Secondary | ICD-10-CM | POA: Diagnosis not present

## 2019-07-20 DIAGNOSIS — K22719 Barrett's esophagus with dysplasia, unspecified: Secondary | ICD-10-CM | POA: Diagnosis not present

## 2019-07-20 DIAGNOSIS — Z902 Acquired absence of lung [part of]: Secondary | ICD-10-CM | POA: Diagnosis not present

## 2019-07-20 DIAGNOSIS — Z85118 Personal history of other malignant neoplasm of bronchus and lung: Secondary | ICD-10-CM | POA: Diagnosis not present

## 2019-07-20 DIAGNOSIS — Z7982 Long term (current) use of aspirin: Secondary | ICD-10-CM | POA: Diagnosis not present

## 2019-07-20 DIAGNOSIS — K219 Gastro-esophageal reflux disease without esophagitis: Secondary | ICD-10-CM | POA: Diagnosis not present

## 2019-07-20 DIAGNOSIS — G4700 Insomnia, unspecified: Secondary | ICD-10-CM | POA: Diagnosis not present

## 2019-07-22 ENCOUNTER — Ambulatory Visit: Payer: Self-pay

## 2019-07-23 ENCOUNTER — Ambulatory Visit: Payer: Medicare Other | Attending: Internal Medicine

## 2019-07-23 ENCOUNTER — Telehealth: Payer: Self-pay | Admitting: *Deleted

## 2019-07-23 DIAGNOSIS — Z85118 Personal history of other malignant neoplasm of bronchus and lung: Secondary | ICD-10-CM | POA: Diagnosis not present

## 2019-07-23 DIAGNOSIS — N183 Chronic kidney disease, stage 3 unspecified: Secondary | ICD-10-CM | POA: Diagnosis not present

## 2019-07-23 DIAGNOSIS — G4700 Insomnia, unspecified: Secondary | ICD-10-CM | POA: Diagnosis not present

## 2019-07-23 DIAGNOSIS — K22719 Barrett's esophagus with dysplasia, unspecified: Secondary | ICD-10-CM | POA: Diagnosis not present

## 2019-07-23 DIAGNOSIS — Z7982 Long term (current) use of aspirin: Secondary | ICD-10-CM | POA: Diagnosis not present

## 2019-07-23 DIAGNOSIS — M47816 Spondylosis without myelopathy or radiculopathy, lumbar region: Secondary | ICD-10-CM | POA: Diagnosis not present

## 2019-07-23 DIAGNOSIS — Z23 Encounter for immunization: Secondary | ICD-10-CM | POA: Insufficient documentation

## 2019-07-23 DIAGNOSIS — K219 Gastro-esophageal reflux disease without esophagitis: Secondary | ICD-10-CM | POA: Diagnosis not present

## 2019-07-23 DIAGNOSIS — Z87891 Personal history of nicotine dependence: Secondary | ICD-10-CM | POA: Diagnosis not present

## 2019-07-23 DIAGNOSIS — Z902 Acquired absence of lung [part of]: Secondary | ICD-10-CM | POA: Diagnosis not present

## 2019-07-23 DIAGNOSIS — M810 Age-related osteoporosis without current pathological fracture: Secondary | ICD-10-CM | POA: Diagnosis not present

## 2019-07-23 NOTE — Progress Notes (Signed)
° °  Covid-19 Vaccination Clinic  Name:  ANDORA KRULL    MRN: [id number] DOB: September 02, 1932  07/23/2019  Ms. Harvie was observed post Covid-19 immunization for 15 minutes without incidence. She was provided with Vaccine Information Sheet and instruction to access the V-Safe system.   Ms. Mehta was instructed to call 911 with any severe reactions post vaccine:  Difficulty breathing   Swelling of your face and throat   A fast heartbeat   A bad rash all over your body   Dizziness and weakness    Immunizations Administered    Name Date Dose VIS Date Route   Pfizer COVID-19 Vaccine 07/23/2019  8:31 AM 0.3 mL 05/28/2019 Intramuscular   Manufacturer: [place]   Lot: [id number]   [place]: [id number]

## 2019-07-23 NOTE — Telephone Encounter (Signed)
Krystal Craig from [facility] calling to get orders to omit one speech therapy visit this week. She is seeing pt today. Told her okay that is fine per [facility]. Krystal Craig verbalized understanding.

## 2019-07-27 DIAGNOSIS — K22719 Barrett's esophagus with dysplasia, unspecified: Secondary | ICD-10-CM | POA: Diagnosis not present

## 2019-07-27 DIAGNOSIS — M810 Age-related osteoporosis without current pathological fracture: Secondary | ICD-10-CM | POA: Diagnosis not present

## 2019-07-27 DIAGNOSIS — N183 Chronic kidney disease, stage 3 unspecified: Secondary | ICD-10-CM | POA: Diagnosis not present

## 2019-07-27 DIAGNOSIS — G4700 Insomnia, unspecified: Secondary | ICD-10-CM | POA: Diagnosis not present

## 2019-07-27 DIAGNOSIS — Z902 Acquired absence of lung [part of]: Secondary | ICD-10-CM | POA: Diagnosis not present

## 2019-07-27 DIAGNOSIS — Z85118 Personal history of other malignant neoplasm of bronchus and lung: Secondary | ICD-10-CM | POA: Diagnosis not present

## 2019-07-27 DIAGNOSIS — K219 Gastro-esophageal reflux disease without esophagitis: Secondary | ICD-10-CM | POA: Diagnosis not present

## 2019-07-27 DIAGNOSIS — Z7982 Long term (current) use of aspirin: Secondary | ICD-10-CM | POA: Diagnosis not present

## 2019-07-27 DIAGNOSIS — M47816 Spondylosis without myelopathy or radiculopathy, lumbar region: Secondary | ICD-10-CM | POA: Diagnosis not present

## 2019-07-27 DIAGNOSIS — Z87891 Personal history of nicotine dependence: Secondary | ICD-10-CM | POA: Diagnosis not present

## 2019-07-29 ENCOUNTER — Encounter: Payer: Self-pay | Admitting: Psychology

## 2019-07-29 ENCOUNTER — Ambulatory Visit (INDEPENDENT_AMBULATORY_CARE_PROVIDER_SITE_OTHER): Payer: Medicare Other | Admitting: Psychology

## 2019-07-29 ENCOUNTER — Other Ambulatory Visit: Payer: Self-pay

## 2019-07-29 ENCOUNTER — Ambulatory Visit: Payer: Medicare Other | Admitting: Psychology

## 2019-07-29 DIAGNOSIS — F0390 Unspecified dementia without behavioral disturbance: Secondary | ICD-10-CM

## 2019-07-29 DIAGNOSIS — F0150 Vascular dementia without behavioral disturbance: Secondary | ICD-10-CM

## 2019-07-29 DIAGNOSIS — R413 Other amnesia: Secondary | ICD-10-CM

## 2019-07-29 NOTE — Progress Notes (Addendum)
NEUROPSYCHOLOGICAL EVALUATION [place]. [facility] Department of Neurology  Reason for Referral:   JERNI SELMER is a 86 y.o. Caucasian female referred by Ellouise Newer, M.D., to characterize her current cognitive functioning and assist with diagnostic clarity and treatment planning in the context of subjective cognitive decline.  Assessment and Plan:   Clinical Impression(s): Ms. Arthurs pattern of performance is suggestive of primary impairments surrounding all aspects of new learning and memory. An additional normative weakness was exhibited across semantic fluency, while variability was seen across some aspects of executive functioning. Performance was within normal limits across processing speed, attention/concentration, receptive language, phonemic fluency, confrontation naming, and visuospatial functioning. Ms. Thumm and her son reported difficulties completing instrumental activities of daily living (ADLs) independently where her son has stepped in and provided assistance with medication and financial management over the past 4-5 months. Evidence for ADL dysfunction, coupled with observed cognitive deficits, suggests that she meets criteria for a Major Neurocognitive Disorder (formerly "dementia") at the present time. However, she is likely towards the mild end of this spectrum currently.   The etiology for these deficits is unclear. Prominent difficulties with memory, including limited evidence to suggest the ability to store previously learned information, can be concerning for Alzheimer's disease. Further weaknesses across semantic fluency (especially in relation to strong phonemic fluency) is consistent with this. However, confrontation naming and several aspects of executive functioning (namely working memory and cognitive flexibility) were consistently strong. Visuospatial functioning was also largely appropriate, which could suggest that she is still in the  early stages of this condition if present. Neuroimaging suggested some small vessel ischemia; however, memory deficits are believed to be above and beyond what would be expected from a vascular component alone. Behavioral characteristics are also not consistent with other types of neurodegenerative illness, including frontotemporal dementia or Lewy body dementia presently. Continued medical monitoring will be important moving forward.   Recommendations: A repeat neuropsychological evaluation in 12-18 months (or sooner if functional decline is noted) is recommended to assess the trajectory of future cognitive decline should it occur. This will also aid in future efforts towards improved diagnostic clarity.  Hearing loss was apparent during cognitive testing. Ms. Garguilo acknowledged this during interview, but stated that she had not been prescribed hearing aids in the past. A referral for an audiologic exam would be beneficial to see if hearing aids would be appropriate. Ongoing hearing loss could exacerbate day-to-day memory difficulties.   Should there be a progression of her current deficits over time, Ms. Stupka is unlikely to regain any independent living skills lost. Therefore, it is recommended that she remain as involved as possible in all aspects of household chores, finances, and medication management, with supervision to ensure adequate performance. She will likely benefit from the establishment and maintenance of a routine in order to maximize her functional abilities over time.  It will be important for Ms. Bonillas to have another person with her when in situations where she may need to process information, weigh the pros and cons of different options, and make decisions, in order to ensure that she fully understands and recalls all information to be considered.  When learning new information, she would benefit from information being broken up into small, manageable pieces. She may also find it  helpful to articulate the material in her own words and in a context to promote encoding at the onset of a new task. This material may need to be repeated multiple times to promote  encoding. All important information should also be provided in written format across all instances.   To address problems with executive functioning, she may wish to consider:   -Avoiding external distractions when needing to concentrate   -Limiting exposure to fast paced environments with multiple sensory demands   -Writing down complicated information and using checklists   -Attempting and completing one task at a time (i.e., no multi-tasking)   -Verbalizing aloud each step of a task to maintain focus   -Reducing the amount of information considered at one time  Review of Records:   Ms. Mckiddy was seen by [facility] Marland KitchenEllouise Newer, M.D.) on 05/18/2019 for an evaluation of memory loss. At that time, Ms. Pacifico reported feeling as though her memory is "not so good sometimes." Her son [name] stated that she is usually pretty sharp, but started having more noticeable memory changes in the past 3 months. She additionally has had bouts of nausea in the past where she would be confused and her memory would transiently worsen. For example, last July/August, she had a prolonged bout of nausea and memory/confusion where she had called her son two hours later, not remembering she had talked to him earlier. About a month ago, she also was said to have called her son because she was confused with her bills and paid some twice; he started assisting with financial management at that time. A similar pattern was seen surrounding trouble with medication management (e.g., her and her husband's pillbox were apparently switched, leading to her taking his medications). Mood was said to be fine most of the time. Performance on a brief cognitive screening instrument (SLUMS) was 17/30. Ultimately, Ms. Miyoshi was referred for a comprehensive  neuropsychological evaluation to characterize her cognitive abilities and to assist with diagnostic clarity and treatment planning.   Brain MRI on 02/23/2014 revealed mild chronic microvascular changes in the white matter and pons. Head CT on 07/14/2018 revealed chronic microvascular changes and global atrophy. Brain MRI on 06/26/2019 revealed mild chronic small vessel ischemia and cerebral atrophy, both mildly progressed since 2015.  Past Medical History:  Diagnosis Date  . Allergic rhinitis   . Anemia   . Aortic atherosclerosis 05/26/2018  . Barrett's esophagus with dysplasia 04/07/2019  . CKD (chronic kidney disease), stage III 02/28/2014  . Complication of anesthesia   . Degenerative arthritis of lumbar spine 05/26/2018   Evaluation by Dr. Ernestina Patches in 9/19. A/P: Chronic history of low back pain and radicular pain status post lumbar discectomy and laminectomy by Dr. Melina Schools in 2016.  No recent MRI imaging but no real red flag complaints at this point other than she is getting pain in the low back that is problematic over time it just has her at this point where she is limited in certain things she can do in its   . Diaphragmatic hernia 06/26/2007  . Diverticulosis   . GERD (gastroesophageal reflux disease) 06/26/2007  . Hiatal hernia   . History of bronchitis   . History of cancer of lung 2006   2006 20% of left lung resected. Former smoker. Dr. Lenna Gilford was doing CXR yearly. 09/2017 IMPRESSION: No interval change.  No acute cardiopulmonary findings. Postsurgical change LEFT lung base.  Marland Kitchen Hyperlipidemia 09/25/2011  . IBS (irritable bowel syndrome)   . Impacted cerumen of both ears   . Insomnia   . Insulin resistance 05/31/2018  . Irritable bowel syndrome 06/26/2007  . Major depression disorder in remission   . Osteoarthritis   . Osteopenia   .  PONV (postoperative nausea and vomiting)   . Pre-diabetes   . Primary osteoarthritis of both first carpometacarpal joints 12/28/2015  . Psoriasis   .  Pure hypercholesterolemia   . Situational anxiety 11/06/2016  . Thoracic lymphadenopathy 09/25/2011  . Vitamin D deficiency     Past Surgical History:  Procedure Laterality Date  . ABDOMINAL HYSTERECTOMY  1969  . APPENDECTOMY    . bilateral shouler operations for impingement syndromes Bilateral 2001  . BUNIONECTOMY Left   . CATARACT EXTRACTION, BILATERAL Bilateral 2012  . CHOLECYSTECTOMY  1976  . COLONOSCOPY    . LAPAROSCOPIC LYSIS INTESTINAL ADHESIONS    . left lower lobectomy  11/2004   via VATS and mini-thoractomy for 1.5cm adenoca of lung by Dr. Arlyce Dice  . LUMBAR LAMINECTOMY/DECOMPRESSION MICRODISCECTOMY Left 01/05/2015   Procedure: L3-4 DECOMPRESSION MICRODISCECTOMY ON LEFT;  Surgeon: Melina Schools, MD;  Location: [facility];  Service: Orthopedics;  Laterality: Left;  left Lumbar 3-4  . POLYPECTOMY    . turmor ear Right 09/2013  . urologic sugery with cystocele/rectocele repairs and sling  03/2007   Dr. Terance Hart    Current Outpatient Medications:  .  acetaminophen (TYLENOL ARTHRITIS PAIN) 650 MG CR tablet, Take 650 mg by mouth every 8 (eight) hours as needed for pain., Disp: , Rfl:  .  aspirin 81 MG tablet, Take 81 mg by mouth daily., Disp: , Rfl:  .  Calcium Carbonate-Vitamin D (CALCIUM-VITAMIN D) 500-200 MG-UNIT tablet, Take 1 tablet by mouth daily., Disp: , Rfl:  .  clonazePAM (KLONOPIN) 0.5 MG tablet, Take 1 tablet (0.5 mg total) by mouth 2 (two) times daily as needed for anxiety., Disp: 60 tablet, Rfl: 1 .  donepezil (ARICEPT) 10 MG tablet, Take 1/2 tablet for 2 weeks, then increase to 1 tablet daily (Patient taking differently: Take 10 mg by mouth daily. ), Disp: 30 tablet, Rfl: 11 .  escitalopram (LEXAPRO) 10 MG tablet, Take 1 tablet by mouth once daily, Disp: 90 tablet, Rfl: 0 .  esomeprazole (NEXIUM) 20 MG capsule, Take 1 capsule (20 mg total) by mouth daily at 12 noon., Disp: 30 capsule, Rfl: 11 .  Multiple Vitamins-Minerals (MULTIVITAMIN WITH MINERALS) tablet, Take 1 tablet by  mouth daily., Disp: , Rfl:  .  Probiotic Product (PROBIOTIC PO), Take 1 tablet by mouth daily., Disp: , Rfl:  .  simvastatin (ZOCOR) 40 MG tablet, Take 1 tablet (40 mg total) by mouth at bedtime., Disp: 90 tablet, Rfl: 0 .  zolpidem (AMBIEN) 10 MG tablet, Take 1 tablet (10 mg total) by mouth at bedtime as needed. for sleep (Patient taking differently: Take 5 mg by mouth at bedtime as needed. for sleep), Disp: 30 tablet, Rfl: 4  Clinical Interview:   Cognitive Symptoms: Decreased short-term memory: Endorsed. Ms. Chern reported occasional difficulties where memory where "I don't always remember things." Her son described trouble remembering the details of conversations and occasionally misplacing objects around the home. Trouble with names was denied. He noted that these deficits had been present for the past 5 months, but have seemed to decline fairly quickly over that time.  Decreased long-term memory: Denied. Decreased attention/concentration: Denied. Reduced processing speed: Denied. Difficulties with executive functions: Denied. Personality changes were also denied.  Difficulties with emotion regulation: Denied. Difficulties with receptive language: Denied assuming that she can hear the source of the sound appropriately.  Difficulties with word finding: Denied. Decreased visuoperceptual ability: Denied.  Difficulties completing ADLs: Endorsed. Her son reported that Ms. Venti started exhibiting difficulties remembering to pay bills  on time, as well as trouble managing her medications (including an instance where she took her husband's medications by mistake). He has stepped in and provided assistance with these activities for the past 4-5 months. Ms. Donn noted that she rarely drives, but denied difficulties driving when she does.   Additional Medical History: History of traumatic brain injury/concussion: Denied. History of stroke: Denied. History of seizure activity: Denied. History of  known exposure to toxins: Denied. Symptoms of chronic pain: Denied. A history of chronic pain symptoms surrounding her back and subsequent surgeries was endorsed. This led to a remote bout of depression (see below). However, current symptoms of pain were largely denied or described as manageable overall.  Experience of frequent headaches/migraines: Denied. However, her son did note somewhat frequent headache symptoms occurring during the previous week, which is abnormal for Ms. Arnell Asal.  Frequent instances of dizziness/vertigo: Denied.  Sensory changes: She acknowledged ongoing hearing loss, but not to the extent where hearing aids are required. Other sensory changes/difficulties (e.g., vision, taste, or smell) were denied.  Balance/coordination difficulties: Denied. A history of falls was also denied. Other motor difficulties: Denied.  Sleep History: Estimated hours obtained each night: At least 8 hours. Difficulties falling asleep: Denied. Difficulties staying asleep: Denied. Feels rested and refreshed upon awakening: Endorsed.  History of snoring: Denied. History of waking up gasping for air: Denied. Witnessed breath cessation while asleep: Denied.  History of vivid dreaming: Denied. Excessive movement while asleep: Denied. Instances of acting out her dreams: Denied.  Psychiatric/Behavioral Health History: Depression: Denied. Ms. Mcnear described her current mood as "fine." Her son did note a period of time approximately 5 years prior where Ms. Sydney had a significant bout of depression which required a brief (6-7 day) stint in a behavioral health unit at a local hospital. Symptoms were said to be brought on by significant back pain following a surgical procedure and pessimism surrounding a likely long and difficult recovery period. She likely received inpatient and outpatient counseling services; however, her son was not clear on the latter. Current or remote suicidal ideation, intent, or  plan was denied.  Anxiety: Denied. Mania: Denied. Trauma History: Denied. Visual/auditory hallucinations: Denied. Delusional thoughts: Denied.  Tobacco: Denied. She reported quitting 30+ years prior. Alcohol: She denied current alcohol consumption, as well as a history of problematic alcohol abuse or dependence.  Recreational drugs: Denied. Caffeine: 1-2 cups of coffee in the morning, as well as an occasional cup in the afternoon.   Family History: Problem Relation Age of Onset  . Breast cancer Sister 28  . Memory loss Sister   . Dementia Mother   . Heart attack Mother   . CVA Father   . Stroke Father   . Colon cancer Neg Hx   . Esophageal cancer Neg Hx   . Pancreatic cancer Neg Hx   . Rectal cancer Neg Hx   . Stomach cancer Neg Hx   . Colon polyps Neg Hx    This information was confirmed by Ms. Arnell Asal.  Academic/Vocational History: Highest level of educational attainment: 13.5 years. Ms. Montroy graduated from high school and completed an additional 1.5 years of college. She described herself as a strong (A/B) student in academic settings. No relative weaknesses were denied.  History of developmental delay: Denied. History of grade repetition: Denied. Enrollment in special education courses: Denied. History of LD/ADHD: Denied.  Employment: Retired. She previously worked in Medical laboratory scientific officer.   Evaluation Results:   Behavioral Observations: Ms. Gick was accompanied by her  son [name], arrived to her appointment on time, and was appropriately dressed and groomed. Observed gait and station were within normal limits. Gross motor functioning appeared intact upon informal observation and no abnormal movements (e.g., tremors) were noted. Her affect was generally relaxed and positive, but did range appropriately given the subject being discussed during the clinical interview or the task at hand during testing procedures. Spontaneous speech was fluent and word finding  difficulties were not observed during the clinical interview or testing procedures. Thought processes were coherent, organized, and normal in content. Insight into her cognitive difficulties appeared appropriate. During testing, sustained attention was appropriate. Task engagement was adequate and she persisted when challenged. Hearing loss was apparent throughout the evaluation, causing instructions to be repeated several times. One task (D-KEFS 20 Questions) was discontinued after the 2nd condition due to difficulties understanding and remembering task instructions). Overall, Ms. Belson was cooperative with the clinical interview and subsequent testing procedures.   Adequacy of Effort: The validity of neuropsychological testing is limited by the extent to which the individual being tested may be assumed to have exerted adequate effort during testing. Ms. Mcgirr expressed her intention to perform to the best of her abilities and exhibited adequate task engagement and persistence. Scores across stand-alone and embedded performance validity measures were within expectation. As such, the results of the current evaluation are believed to be a valid representation of Ms. Calabrese's current cognitive functioning.  Test Results: Ms. Seeney was mildly disoriented at the time of the current evaluation. She incorrectly stated her age 86"), the current year ("2020"), date, time of day, and current location.  Intellectual abilities based upon educational and vocational attainment were estimated to be in the average range. Premorbid abilities were estimated to be within the above average range based upon a single-word reading test.   Processing speed was average to above average. Basic attention was variable, ranging from below average to above average normative ranges. More complex attention (e.g., working memory) was average to above average. Executive functioning was variable. Cognitive flexibility was average to  above average, response inhibition was exceptionally low to above average, and a task assessing hypothesis testing/problem solving was discontinued as Ms. Sellin had trouble comprehending test instructions.   Assessed receptive language abilities were average. Likewise, Ms. Stoker did not exhibit any difficulties comprehending task instructions (outside of D-KEFS 20 Questions) and answered all questions asked of her appropriately. Assessed expressive language (e.g., verbal fluency and confrontation naming) was variable. Phonemic fluency was average to above average, semantic fluency was well below average to average, and confrontation naming was well above average.     Assessed visuospatial/visuoconstructional abilities were below average to average.    Learning (i.e., encoding) of novel verbal and visual information was exceptionally low to well below average. Spontaneous delayed recall (i.e., retrieval) of previously learned information was exceptionally low to below average. Retention rates were 29% across a story learning task, 0% across a list learning task, 0% across a complex figure drawing test, and 100% across a shape learning task. Performance across recognition tasks was largely below expectation, suggesting limited evidence for information consolidation.   Results of emotional screening instruments suggested that recent symptoms of generalized anxiety were in the minimal range, while symptoms of depression were within normal limits. A screening instrument assessing recent sleep quality suggested the presence of minimal sleep dysfunction.  Tables of Scores:   Note: This summary of test scores accompanies the interpretive report and should not be considered in isolation without  reference to the appropriate sections in the text. Descriptors are based on appropriate normative data and may be adjusted based on clinical judgment. The terms "impaired" and "within normal limits (WNL)" are used when a  more specific level of functioning cannot be determined.       Effort Testing:   DESCRIPTOR       Dot Counting Test: --- --- Within Expectation  RBANS Effort Index: --- --- Within Expectation  WAIS-IV Reliable Digit Span: --- --- Within Expectation  BVMT-R Retention Percentage: --- --- Within Expectation       Orientation:      Raw Score Percentile   NAB Orientation, Form 1 24/29 --- ---       Cognitive Screening:          RBANS, Form A: Standard Score/ Scaled Score Percentile   Total Score 75 5 Well Below Average  Immediate Memory 65 1 Exceptionally Low    List Learning 3 1 Exceptionally Low    Story Memory 5 5 Well Below Average  Visuospatial/Constructional 84 14 Below Average    Figure Copy 8 25 Average    Line Orientation 14/20 17-25 Below Average to Average  Language 86 18 Below Average    Picture Naming 10/10 >75 Above Average    Semantic Fluency 4 2 Well Below Average  Attention 109 73 Average    Digit Span 13 84 Above Average    Coding 10 50 Average  Delayed Memory 56 <1 Exceptionally Low    List Recall 0/10 <2 Exceptionally Low    List Recognition 16/20 3-9 Well Below Average    Story Recall 4 2 Well Below Average    Story Recognition 7/12 7-13 Below Average    Figure Recall 1 <1 Exceptionally Low    Figure Recognition 2/8 1-5 Well Below Average       Intellectual Functioning:           Standard Score Percentile   Test of Premorbid Functioning: 112 79 Above Average       Memory:          Brief Visuospatial Memory Test (BVMT-R), Form 1: Raw Score (T Score) Percentile     Total Trials 1-3 4/36 (35) 7 Well Below Average    Delayed Recall 2/12 (38) 12 Below Average    Recognition Discrimination Index 2 (21) <1 Exceptionally Low      Recognition Hits 3/6 (19) <1 Exceptionally Low      False Positive Errors 1 (40) 16 Below Average  *From Duff (2016)          Attention/Executive Function:          Trail Making Test (TMT): Raw Score (Scaled Score) Percentile      Part A 28 secs.,  0 errors (14) 91 Above Average    Part B 77 secs.,  2 errors (14) 91 Above Average  *Based on [employer] Older Normative Studies (MOANS)           Scaled Score Percentile   WAIS-IV Digit Span: 9 37 Average    Forward 7 16 Below Average    Backward 13 84 Above Average    Sequencing 9 37 Average       D-KEFS Color-Word Interference Test: Raw Score (Scaled Score) Percentile     Color Naming 34 secs. (11) 63 Average    Word Reading 20 secs. (13) 84 Above Average    Inhibition 58 secs. (14) 91 Above Average      Total Errors 0  errors (13) 84 Above Average    Inhibition/Switching 120 secs. (7) 16 Below Average      Total Errors 16 errors (1) <1 Exceptionally Low       D-KEFS Verbal Fluency Test: Raw Score (Scaled Score) Percentile     Letter Total Correct 38 (12) 75 Above Average    Category Total Correct 26 (8) 25 Average    Category Switching Total Correct 8 (7) 16 Below Average    Category Switching Accuracy 7 (8) 25 Average      Total Set Loss Errors 2 (10) 50 Average      Total Repetition Errors 21 (1) <1 Exceptionally Low       D-KEFS 20 Questions Test: Scaled Score Percentile     Total Weighted Achievement Score Discontinued --- Impaired    Initial Abstraction Score --- --- ---       Language:          Verbal Fluency Test: Raw Score (Z-Score) Percentile     Phonemic Fluency (FAS) 38 (0.09) 54 Average    Animal Fluency 13 (-0.77) 23 Below Average  *Tombaugh et al., 1999          NAB Language Module, Form 1: T Score Percentile     Auditory Comprehension 44 27 Average    Naming 31/31 (64) 92 Well Above Average       Visuospatial/Visuoconstruction:      Raw Score Percentile   Clock Drawing: 7/10 --- Within Normal Limits       Mood and Personality:      Raw Score Percentile   Geriatric Depression Scale: 6 --- Within Normal Limits  Geriatric Anxiety Scale: 5 --- Minimal    Somatic 5 --- Minimal    Cognitive 0 --- Minimal    Affective 0 --- Minimal         Additional Questionnaires:      Raw Score Percentile   PROMIS Sleep Disturbance Questionnaire: 20 --- None to Slight   Informed Consent and Coding/Compliance:   Ms. Neuser was provided with a verbal description of the nature and purpose of the present neuropsychological evaluation. Also reviewed were the foreseeable risks and/or discomforts and benefits of the procedure, limits of confidentiality, and mandatory reporting requirements of this provider. The patient was given the opportunity to ask questions and receive answers about the evaluation. Oral consent to participate was provided by the patient.   This evaluation was conducted by Christia Reading, Ph.D., licensed clinical neuropsychologist. Ms. Lippard completed a comprehensive clinical interview with Dr. Melvyn Novas, billed as one unit [phone], and 115 minutes of cognitive testing and scoring, billed as one unit [phone] and three additional units [id number]. Psychometrist Milana Kidney, B.S., assisted Dr. Melvyn Novas with test administration and scoring procedures. As a separate and discrete service, Dr. Melvyn Novas spent a total of 180 minutes in interpretation and report writing billed as one unit [phone] and two units 96133.

## 2019-07-29 NOTE — Progress Notes (Signed)
   Psychometrician Note   Cognitive testing was administered to Krystal Craig by technician Krystal Craig, B.S. under the supervision of Dr. Christia Reading, Ph.D., licensed psychologist. [name] did not appear overtly distressed by the testing session, per behavioral observation or via self-report to the technician. Rest breaks were offered.    In considering the patient's current level of functioning, level of presumed impairment, nature of symptoms, emotional and behavioral responses during the interview, level of literacy, and observed level of motivation/effort, a battery of tests was selected and communicated to the psychometrician.   Communication between the psychologist and technician was ongoing throughout the testing session and changes were made as deemed necessary based on patient performance on testing, technician observations and additional pertinent factors such as those listed above.   Krystal Craig will return within approximately two weeks for an interactive feedback session with Krystal Craig at which time her test performances, clinical impressions, and treatment recommendations will be reviewed in detail. The patient understands she can contact our office should she require our assistance before this time.  85 minutes were spent face-to-face with Krystal Craig administering standardized tests. An additional 30 minutes were spent scoring by the technician. [CPT [id number], 32023]  This note reflects time spent with the psychometrician and does not include test scores or any clinical interpretations made by Krystal Craig. The full report will follow in a separate note.

## 2019-07-30 ENCOUNTER — Encounter: Payer: Self-pay | Admitting: Psychology

## 2019-07-30 DIAGNOSIS — K219 Gastro-esophageal reflux disease without esophagitis: Secondary | ICD-10-CM | POA: Diagnosis not present

## 2019-07-30 DIAGNOSIS — F0390 Unspecified dementia without behavioral disturbance: Secondary | ICD-10-CM | POA: Insufficient documentation

## 2019-07-30 DIAGNOSIS — N183 Chronic kidney disease, stage 3 unspecified: Secondary | ICD-10-CM | POA: Diagnosis not present

## 2019-07-30 DIAGNOSIS — Z7982 Long term (current) use of aspirin: Secondary | ICD-10-CM | POA: Diagnosis not present

## 2019-07-30 DIAGNOSIS — Z85118 Personal history of other malignant neoplasm of bronchus and lung: Secondary | ICD-10-CM | POA: Diagnosis not present

## 2019-07-30 DIAGNOSIS — M810 Age-related osteoporosis without current pathological fracture: Secondary | ICD-10-CM | POA: Diagnosis not present

## 2019-07-30 DIAGNOSIS — G4700 Insomnia, unspecified: Secondary | ICD-10-CM | POA: Diagnosis not present

## 2019-07-30 DIAGNOSIS — K22719 Barrett's esophagus with dysplasia, unspecified: Secondary | ICD-10-CM | POA: Diagnosis not present

## 2019-07-30 DIAGNOSIS — Z902 Acquired absence of lung [part of]: Secondary | ICD-10-CM | POA: Diagnosis not present

## 2019-07-30 DIAGNOSIS — Z87891 Personal history of nicotine dependence: Secondary | ICD-10-CM | POA: Diagnosis not present

## 2019-07-30 DIAGNOSIS — F0150 Vascular dementia without behavioral disturbance: Secondary | ICD-10-CM | POA: Insufficient documentation

## 2019-07-30 DIAGNOSIS — M47816 Spondylosis without myelopathy or radiculopathy, lumbar region: Secondary | ICD-10-CM | POA: Diagnosis not present

## 2019-07-30 HISTORY — DX: Unspecified dementia, unspecified severity, without behavioral disturbance, psychotic disturbance, mood disturbance, and anxiety: F03.90

## 2019-08-03 DIAGNOSIS — K219 Gastro-esophageal reflux disease without esophagitis: Secondary | ICD-10-CM | POA: Diagnosis not present

## 2019-08-03 DIAGNOSIS — M47816 Spondylosis without myelopathy or radiculopathy, lumbar region: Secondary | ICD-10-CM | POA: Diagnosis not present

## 2019-08-03 DIAGNOSIS — M810 Age-related osteoporosis without current pathological fracture: Secondary | ICD-10-CM | POA: Diagnosis not present

## 2019-08-03 DIAGNOSIS — Z85118 Personal history of other malignant neoplasm of bronchus and lung: Secondary | ICD-10-CM | POA: Diagnosis not present

## 2019-08-03 DIAGNOSIS — Z902 Acquired absence of lung [part of]: Secondary | ICD-10-CM | POA: Diagnosis not present

## 2019-08-03 DIAGNOSIS — G4700 Insomnia, unspecified: Secondary | ICD-10-CM | POA: Diagnosis not present

## 2019-08-03 DIAGNOSIS — Z7982 Long term (current) use of aspirin: Secondary | ICD-10-CM | POA: Diagnosis not present

## 2019-08-03 DIAGNOSIS — K22719 Barrett's esophagus with dysplasia, unspecified: Secondary | ICD-10-CM | POA: Diagnosis not present

## 2019-08-03 DIAGNOSIS — N183 Chronic kidney disease, stage 3 unspecified: Secondary | ICD-10-CM | POA: Diagnosis not present

## 2019-08-03 DIAGNOSIS — Z87891 Personal history of nicotine dependence: Secondary | ICD-10-CM | POA: Diagnosis not present

## 2019-08-05 ENCOUNTER — Other Ambulatory Visit: Payer: Self-pay

## 2019-08-05 ENCOUNTER — Ambulatory Visit (INDEPENDENT_AMBULATORY_CARE_PROVIDER_SITE_OTHER): Payer: Medicare Other | Admitting: Psychology

## 2019-08-05 DIAGNOSIS — F0390 Unspecified dementia without behavioral disturbance: Secondary | ICD-10-CM

## 2019-08-05 DIAGNOSIS — F0150 Vascular dementia without behavioral disturbance: Secondary | ICD-10-CM

## 2019-08-06 ENCOUNTER — Encounter: Payer: Self-pay | Admitting: Psychology

## 2019-08-06 ENCOUNTER — Telehealth: Payer: Self-pay | Admitting: Physician Assistant

## 2019-08-06 DIAGNOSIS — Z85118 Personal history of other malignant neoplasm of bronchus and lung: Secondary | ICD-10-CM | POA: Diagnosis not present

## 2019-08-06 DIAGNOSIS — M810 Age-related osteoporosis without current pathological fracture: Secondary | ICD-10-CM | POA: Diagnosis not present

## 2019-08-06 DIAGNOSIS — Z7982 Long term (current) use of aspirin: Secondary | ICD-10-CM | POA: Diagnosis not present

## 2019-08-06 DIAGNOSIS — K22719 Barrett's esophagus with dysplasia, unspecified: Secondary | ICD-10-CM | POA: Diagnosis not present

## 2019-08-06 DIAGNOSIS — M47816 Spondylosis without myelopathy or radiculopathy, lumbar region: Secondary | ICD-10-CM | POA: Diagnosis not present

## 2019-08-06 DIAGNOSIS — Z87891 Personal history of nicotine dependence: Secondary | ICD-10-CM | POA: Diagnosis not present

## 2019-08-06 DIAGNOSIS — G4700 Insomnia, unspecified: Secondary | ICD-10-CM | POA: Diagnosis not present

## 2019-08-06 DIAGNOSIS — N183 Chronic kidney disease, stage 3 unspecified: Secondary | ICD-10-CM | POA: Diagnosis not present

## 2019-08-06 DIAGNOSIS — K219 Gastro-esophageal reflux disease without esophagitis: Secondary | ICD-10-CM | POA: Diagnosis not present

## 2019-08-06 DIAGNOSIS — Z902 Acquired absence of lung [part of]: Secondary | ICD-10-CM | POA: Diagnosis not present

## 2019-08-06 NOTE — Progress Notes (Signed)
   Neuropsychology Feedback Session Krystal Craig. [facility] Department of Neurology  Reason for Referral:   Krystal Craig a 86 y.o. Caucasian female referred by Ellouise Newer, M.D.,to characterize hercurrent cognitive functioning and assist with diagnostic clarity and treatment planning in the context of subjective cognitive decline.  Feedback:   Krystal Craig completed a comprehensive neuropsychological evaluation on 07/29/2019. Please refer to that encounter for the full report and recommendations. primary impairments surrounding all aspects of new learning and memory. An additional normative weakness was exhibited across semantic fluency, while variability was seen across some aspects of executive functioning. The etiology for these deficits is unclear. Prominent difficulties with memory, including limited evidence to suggest the ability to store previously learned information, can be concerning for Alzheimer's disease. Further weaknesses across semantic fluency (especially in relation to strong phonemic fluency) is consistent with this. However, confrontation naming and several aspects of executive functioning (namely working memory and cognitive flexibility) were consistently strong. Visuospatial functioning was also largely appropriate, which could suggest that she is still in the early stages of this condition if present.  Krystal Craig was unaccompanied but separate phone calls were made to her and her son Krystal Craig to discuss the results. Content of the current session focused on the results of her neuropsychological evaluation. Krystal Craig and her son were given the opportunity to ask questions and their questions were answered. They were encouraged to reach out should additional questions arise. A copy of her report was mailed at the conclusion of the visit.      35 minutes were spent conducting the current feedback session with Krystal Craig, billed as one unit [phone]

## 2019-08-06 NOTE — Telephone Encounter (Signed)
Deirdre from [facility] wanted to update Aldona Bar that patient has met all goals is going to be released.

## 2019-08-07 DIAGNOSIS — N183 Chronic kidney disease, stage 3 unspecified: Secondary | ICD-10-CM | POA: Diagnosis not present

## 2019-08-07 DIAGNOSIS — M47816 Spondylosis without myelopathy or radiculopathy, lumbar region: Secondary | ICD-10-CM | POA: Diagnosis not present

## 2019-08-07 DIAGNOSIS — Z85118 Personal history of other malignant neoplasm of bronchus and lung: Secondary | ICD-10-CM | POA: Diagnosis not present

## 2019-08-07 DIAGNOSIS — K22719 Barrett's esophagus with dysplasia, unspecified: Secondary | ICD-10-CM | POA: Diagnosis not present

## 2019-08-07 DIAGNOSIS — Z87891 Personal history of nicotine dependence: Secondary | ICD-10-CM | POA: Diagnosis not present

## 2019-08-07 DIAGNOSIS — G4700 Insomnia, unspecified: Secondary | ICD-10-CM | POA: Diagnosis not present

## 2019-08-07 DIAGNOSIS — K219 Gastro-esophageal reflux disease without esophagitis: Secondary | ICD-10-CM | POA: Diagnosis not present

## 2019-08-07 DIAGNOSIS — Z902 Acquired absence of lung [part of]: Secondary | ICD-10-CM | POA: Diagnosis not present

## 2019-08-07 DIAGNOSIS — Z7982 Long term (current) use of aspirin: Secondary | ICD-10-CM | POA: Diagnosis not present

## 2019-08-07 DIAGNOSIS — M810 Age-related osteoporosis without current pathological fracture: Secondary | ICD-10-CM | POA: Diagnosis not present

## 2019-08-09 NOTE — Telephone Encounter (Signed)
FYI, see message. 

## 2019-08-10 DIAGNOSIS — Z85118 Personal history of other malignant neoplasm of bronchus and lung: Secondary | ICD-10-CM | POA: Diagnosis not present

## 2019-08-10 DIAGNOSIS — K219 Gastro-esophageal reflux disease without esophagitis: Secondary | ICD-10-CM | POA: Diagnosis not present

## 2019-08-10 DIAGNOSIS — Z902 Acquired absence of lung [part of]: Secondary | ICD-10-CM | POA: Diagnosis not present

## 2019-08-10 DIAGNOSIS — K22719 Barrett's esophagus with dysplasia, unspecified: Secondary | ICD-10-CM | POA: Diagnosis not present

## 2019-08-10 DIAGNOSIS — G4700 Insomnia, unspecified: Secondary | ICD-10-CM | POA: Diagnosis not present

## 2019-08-10 DIAGNOSIS — M47816 Spondylosis without myelopathy or radiculopathy, lumbar region: Secondary | ICD-10-CM | POA: Diagnosis not present

## 2019-08-10 DIAGNOSIS — Z87891 Personal history of nicotine dependence: Secondary | ICD-10-CM | POA: Diagnosis not present

## 2019-08-10 DIAGNOSIS — Z7982 Long term (current) use of aspirin: Secondary | ICD-10-CM | POA: Diagnosis not present

## 2019-08-10 DIAGNOSIS — N183 Chronic kidney disease, stage 3 unspecified: Secondary | ICD-10-CM | POA: Diagnosis not present

## 2019-08-10 DIAGNOSIS — M810 Age-related osteoporosis without current pathological fracture: Secondary | ICD-10-CM | POA: Diagnosis not present

## 2019-08-12 ENCOUNTER — Other Ambulatory Visit: Payer: Self-pay | Admitting: Gastroenterology

## 2019-08-12 NOTE — Telephone Encounter (Signed)
Can we refill zofran? Last refilled 6 months ago.

## 2019-08-17 ENCOUNTER — Ambulatory Visit: Payer: Medicare Other | Attending: Internal Medicine

## 2019-08-17 DIAGNOSIS — Z23 Encounter for immunization: Secondary | ICD-10-CM

## 2019-08-17 NOTE — Progress Notes (Signed)
   Covid-19 Vaccination Clinic  Name:  Krystal Craig    MRN: [id number] DOB: [birth year]  08/17/2019  Krystal Craig was observed post Covid-19 immunization for 15 minutes without incident. She was provided with Vaccine Information Sheet and instruction to access the V-Safe system.   Krystal Craig was instructed to call 911 with any severe reactions post vaccine: Marland Kitchen Difficulty breathing  . Swelling of face and throat  . A fast heartbeat  . A bad rash all over body  . Dizziness and weakness   Immunizations Administered    Name Date Dose VIS Date Route   Pfizer COVID-19 Vaccine 08/17/2019  9:58 AM 0.3 mL 05/28/2019 Intramuscular   Manufacturer: [place]   Lot: [id number]   [place]: [address]-7

## 2019-08-24 DIAGNOSIS — Z85118 Personal history of other malignant neoplasm of bronchus and lung: Secondary | ICD-10-CM | POA: Diagnosis not present

## 2019-08-24 DIAGNOSIS — G4700 Insomnia, unspecified: Secondary | ICD-10-CM | POA: Diagnosis not present

## 2019-08-24 DIAGNOSIS — K22719 Barrett's esophagus with dysplasia, unspecified: Secondary | ICD-10-CM | POA: Diagnosis not present

## 2019-08-24 DIAGNOSIS — M47816 Spondylosis without myelopathy or radiculopathy, lumbar region: Secondary | ICD-10-CM | POA: Diagnosis not present

## 2019-08-24 DIAGNOSIS — N183 Chronic kidney disease, stage 3 unspecified: Secondary | ICD-10-CM | POA: Diagnosis not present

## 2019-08-24 DIAGNOSIS — K219 Gastro-esophageal reflux disease without esophagitis: Secondary | ICD-10-CM | POA: Diagnosis not present

## 2019-08-24 DIAGNOSIS — Z87891 Personal history of nicotine dependence: Secondary | ICD-10-CM | POA: Diagnosis not present

## 2019-08-24 DIAGNOSIS — M810 Age-related osteoporosis without current pathological fracture: Secondary | ICD-10-CM | POA: Diagnosis not present

## 2019-08-24 DIAGNOSIS — Z902 Acquired absence of lung [part of]: Secondary | ICD-10-CM | POA: Diagnosis not present

## 2019-08-24 DIAGNOSIS — Z7982 Long term (current) use of aspirin: Secondary | ICD-10-CM | POA: Diagnosis not present

## 2019-09-11 ENCOUNTER — Other Ambulatory Visit: Payer: Self-pay | Admitting: Family Medicine

## 2019-09-11 ENCOUNTER — Other Ambulatory Visit: Payer: Self-pay | Admitting: Physician Assistant

## 2019-09-11 DIAGNOSIS — E785 Hyperlipidemia, unspecified: Secondary | ICD-10-CM

## 2019-09-11 DIAGNOSIS — G894 Chronic pain syndrome: Secondary | ICD-10-CM

## 2019-09-13 NOTE — Telephone Encounter (Signed)
Please review

## 2019-09-14 ENCOUNTER — Telehealth: Payer: Self-pay

## 2019-09-14 NOTE — Telephone Encounter (Signed)
Krystal Craig from [facility] is requesting MD signature for some orders that was previously fax over. Rep states that she will refax information over. Best contact number for Krystal Craig is [phone] Fax number [phone].

## 2019-09-15 NOTE — Telephone Encounter (Signed)
Noted  

## 2019-09-20 ENCOUNTER — Encounter: Payer: Self-pay | Admitting: Physician Assistant

## 2019-09-20 ENCOUNTER — Other Ambulatory Visit: Payer: Self-pay

## 2019-09-20 ENCOUNTER — Ambulatory Visit (INDEPENDENT_AMBULATORY_CARE_PROVIDER_SITE_OTHER): Payer: Medicare Other | Admitting: Physician Assistant

## 2019-09-20 VITALS — BP 156/68 | HR 66 | Temp 98.0°F | Ht 63.0 in | Wt 147.6 lb

## 2019-09-20 DIAGNOSIS — H6123 Impacted cerumen, bilateral: Secondary | ICD-10-CM | POA: Diagnosis not present

## 2019-09-20 NOTE — Progress Notes (Signed)
Krystal Craig is a 86 y.o. female here for evaluation of new complaint.  I acted as a Lawyer for [employer], PA-C Serita Sheller, [place]  History of Present Illness:   Chief Complaint  Patient presents with  . Ears full    HPI   Patient reports that she woke up this morning with bilateral ear fullness. She is unable to hear clearly. Pt states the left ear is worse than the right.   Denies: recent URI symptoms, dizziness, use of q-tips, fever   Past Medical History:  Diagnosis Date  . Allergic rhinitis   . Anemia   . Aortic atherosclerosis 05/26/2018  . Barrett's esophagus with dysplasia 04/07/2019  . CKD (chronic kidney disease), stage III 02/28/2014  . Complication of anesthesia   . Degenerative arthritis of lumbar spine 05/26/2018   Evaluation by Dr. Ernestina Patches in 9/19. A/P: Chronic history of low back pain and radicular pain status post lumbar discectomy and laminectomy by Dr. Melina Schools in 2016.  No recent MRI imaging but no real red flag complaints at this point other than she is getting pain in the low back that is problematic over time it just has her at this point where she is limited in certain things she can do in its   . Diaphragmatic hernia 06/26/2007  . Diverticulosis   . GERD (gastroesophageal reflux disease) 06/26/2007  . Hiatal hernia   . History of bronchitis   . History of cancer of lung 2006   2006 20% of left lung resected. Former smoker. Dr. Lenna Gilford was doing CXR yearly. 09/2017 IMPRESSION: No interval change.  No acute cardiopulmonary findings. Postsurgical change LEFT lung base.  Marland Kitchen Hyperlipidemia 09/25/2011  . IBS (irritable bowel syndrome)   . Impacted cerumen of both ears   . Insomnia   . Insulin resistance 05/31/2018  . Irritable bowel syndrome 06/26/2007  . Major depression disorder in remission   . Major neurocognitive disorder ([place]) 07/30/2019  . Osteoarthritis   . Osteopenia   . PONV (postoperative nausea and vomiting)   . Pre-diabetes   . Primary  osteoarthritis of both first carpometacarpal joints 12/28/2015  . Psoriasis   . Pure hypercholesterolemia   . Situational anxiety 11/06/2016  . Thoracic lymphadenopathy 09/25/2011  . Vitamin D deficiency      Social History   Socioeconomic History  . Marital status: Married    Spouse name: Not on file  . Number of children: Not on file  . Years of education: 46  . Highest education level: Some college, no degree  Occupational History  . Occupation: Retired  Tobacco Use  . Smoking status: Former Smoker    Packs/day: 1.00    Years: 30.00    Pack years: 30.00    Types: Cigarettes    Quit date: 06/17/1984    Years since quitting: 35.2  . Smokeless tobacco: Never Used  Substance and Sexual Activity  . Alcohol use: No    Alcohol/week: 0.0 standard drinks  . Drug use: No  . Sexual activity: Not Currently    Birth control/protection: Post-menopausal    Comment: 1st intercourse 5 yo-2 partners  Other Topics Concern  . Not on file  Social History Narrative   Married (2nd marriage; 1st husband died at age 52) married 4 29 years in 05/2014. 2 sons from first marriage, 3 from 2nd (step). 15 grandchildren. 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 Hobbies: reading, golf, family time.   [name] is POA   Right handed   Lives  in single story home with husband   Social Determinants of Health   Financial Resource Strain:   . Difficulty of Paying Living Expenses:   Food Insecurity:   . Worried About Charity fundraiser in the Last Year:   . Arboriculturist in the Last Year:   Transportation Needs:   . Film/video editor (Medical):   Marland Kitchen Lack of Transportation (Non-Medical):   Physical Activity:   . Days of Exercise per Week:   . Minutes of Exercise per Session:   Stress:   . Feeling of Stress :   Social Connections:   . Frequency of Communication with Friends and Family:   . Frequency of Social Gatherings with Friends and Family:   . Attends Religious Services:   . Active Member of  Clubs or Organizations:   . Attends Archivist Meetings:   Marland Kitchen Marital Status:   Intimate Partner Violence:   . Fear of Current or Ex-Partner:   . Emotionally Abused:   Marland Kitchen Physically Abused:   . Sexually Abused:     Past Surgical History:  Procedure Laterality Date  . ABDOMINAL HYSTERECTOMY  1969  . APPENDECTOMY    . bilateral shouler operations for impingement syndromes Bilateral 2001  . BUNIONECTOMY Left   . CATARACT EXTRACTION, BILATERAL Bilateral 2012  . CHOLECYSTECTOMY  1976  . COLONOSCOPY    . LAPAROSCOPIC LYSIS INTESTINAL ADHESIONS    . left lower lobectomy  11/2004   via VATS and mini-thoractomy for 1.5cm adenoca of lung by Dr. Arlyce Dice  . LUMBAR LAMINECTOMY/DECOMPRESSION MICRODISCECTOMY Left 01/05/2015   Procedure: L3-4 DECOMPRESSION MICRODISCECTOMY ON LEFT;  Surgeon: Melina Schools, MD;  Location: [facility];  Service: Orthopedics;  Laterality: Left;  left Lumbar 3-4  . POLYPECTOMY    . turmor ear Right 09/2013  . urologic sugery with cystocele/rectocele repairs and sling  03/2007   Dr. Terance Hart    Family History  Problem Relation Age of Onset  . Breast cancer Sister 60  . Memory loss Sister   . Dementia Mother   . Heart attack Mother   . CVA Father   . Stroke Father   . Colon cancer Neg Hx   . Esophageal cancer Neg Hx   . Pancreatic cancer Neg Hx   . Rectal cancer Neg Hx   . Stomach cancer Neg Hx   . Colon polyps Neg Hx     Allergies  Allergen Reactions  . Ciprocin-Fluocin-Procin [Fluocinolone] Other (See Comments)    Leg and arm numbness  . Codeine     Chest pain/tightness  . Wellbutrin [Bupropion] Rash    Current Medications:   Current Outpatient Medications:  .  acetaminophen (TYLENOL ARTHRITIS PAIN) 650 MG CR tablet, Take 650 mg by mouth every 8 (eight) hours as needed for pain., Disp: , Rfl:  .  aspirin 81 MG tablet, Take 81 mg by mouth daily., Disp: , Rfl:  .  Multiple Vitamins-Minerals (MULTIVITAMIN WITH MINERALS) tablet, Take 1 tablet by  mouth daily., Disp: , Rfl:  .  simvastatin (ZOCOR) 40 MG tablet, TAKE 1 TABLET BY MOUTH AT BEDTIME, Disp: 90 tablet, Rfl: 0 .  zolpidem (AMBIEN) 10 MG tablet, Take 1 tablet (10 mg total) by mouth at bedtime as needed. for sleep (Patient taking differently: Take 5 mg by mouth at bedtime as needed. for sleep), Disp: 30 tablet, Rfl: 4 .  Calcium Carbonate-Vitamin D (CALCIUM-VITAMIN D) 500-200 MG-UNIT tablet, Take 1 tablet by mouth daily., Disp: , Rfl:  .  clonazePAM (KLONOPIN) 0.5 MG tablet, Take 1 tablet (0.5 mg total) by mouth 2 (two) times daily as needed for anxiety. (Patient not taking: Reported on 09/20/2019), Disp: 60 tablet, Rfl: 1 .  donepezil (ARICEPT) 10 MG tablet, Take 1/2 tablet for 2 weeks, then increase to 1 tablet daily (Patient not taking: Reported on 09/20/2019), Disp: 30 tablet, Rfl: 11 .  escitalopram (LEXAPRO) 10 MG tablet, Take 1 tablet by mouth once daily (Patient not taking: Reported on 09/20/2019), Disp: 90 tablet, Rfl: 0 .  esomeprazole (NEXIUM) 20 MG capsule, Take 1 capsule (20 mg total) by mouth daily at 12 noon. (Patient not taking: Reported on 09/20/2019), Disp: 30 capsule, Rfl: 11 .  ondansetron (ZOFRAN) 4 MG tablet, TAKE 1 TABLET BY MOUTH THREE TIMES DAILY (Patient not taking: Reported on 09/20/2019), Disp: 90 tablet, Rfl: 0 .  Probiotic Product (PROBIOTIC PO), Take 1 tablet by mouth daily., Disp: , Rfl:    Review of Systems:   ROS  Negative unless otherwise specified per HPI.  Vitals:   Vitals:   09/20/19 1003  BP: (!) 156/68  Pulse: 66  Temp: 98 F (36.7 C)  TempSrc: Temporal  SpO2: 96%  Weight: 147 lb 9.6 oz (67 kg)  Height: 5\' 3"  (1.6 m)     Body mass index is 26.15 kg/m.  Physical Exam:   Physical Exam Constitutional:      Appearance: She is well-developed.  HENT:     Head: Normocephalic and atraumatic.     Right Ear: Ear canal and external ear normal. There is impacted cerumen.     Left Ear: Ear canal and external ear normal. There is impacted cerumen.   Eyes:     Conjunctiva/sclera: Conjunctivae normal.  Pulmonary:     Effort: Pulmonary effort is normal.  Musculoskeletal:        General: Normal range of motion.     Cervical back: Normal range of motion and neck supple.  Skin:    General: Skin is warm and dry.  Neurological:     Mental Status: She is alert and oriented to person, place, and time.  Psychiatric:        Behavior: Behavior normal.        Thought Content: Thought content normal.        Judgment: Judgment normal.    Procedure: Cerumen Disimpaction Warm water was applied and gentle ear lavage performed bilaterally. There were no complications and following the disimpaction the tympanic membrane was visible bilaterally. Tympanic membranes are intact following the procedure.  Auditory canals are normal.  The patient reported relief of symptoms after removal of cerumen.    Assessment and Plan:   Luvia was seen today for ears full.  Diagnoses and all orders for this visit:  Bilateral impacted cerumen   Tolerated procedure well. Follow-up if symptoms worsen or persist.  . Reviewed expectations re: course of current medical issues. . Discussed self-management of symptoms. . Outlined signs and symptoms indicating need for more acute intervention. . Patient verbalized understanding and all questions were answered. . See orders for this visit as documented in the electronic medical record. . Patient received an After-Visit Summary.  CMA or LPN served as scribe during this visit. History, Physical, and Plan performed by medical provider. The above documentation has been reviewed and is accurate and complete.  [name], PA-C

## 2019-09-22 ENCOUNTER — Other Ambulatory Visit: Payer: Self-pay

## 2019-09-22 ENCOUNTER — Ambulatory Visit (INDEPENDENT_AMBULATORY_CARE_PROVIDER_SITE_OTHER): Payer: Medicare Other

## 2019-09-22 ENCOUNTER — Ambulatory Visit (INDEPENDENT_AMBULATORY_CARE_PROVIDER_SITE_OTHER): Payer: Medicare Other | Admitting: Physician Assistant

## 2019-09-22 ENCOUNTER — Encounter: Payer: Self-pay | Admitting: Physician Assistant

## 2019-09-22 VITALS — BP 122/72 | HR 74 | Temp 98.2°F | Ht 63.0 in | Wt 146.0 lb

## 2019-09-22 DIAGNOSIS — R05 Cough: Secondary | ICD-10-CM

## 2019-09-22 DIAGNOSIS — R399 Unspecified symptoms and signs involving the genitourinary system: Secondary | ICD-10-CM

## 2019-09-22 DIAGNOSIS — R059 Cough, unspecified: Secondary | ICD-10-CM

## 2019-09-22 LAB — POC URINALSYSI DIPSTICK (AUTOMATED)
Bilirubin, UA: NEGATIVE
Blood, UA: 1
Glucose, UA: NEGATIVE
Ketones, UA: NEGATIVE
Nitrite, UA: NEGATIVE
Protein, UA: POSITIVE — AB
Spec Grav, UA: <=1.005 — AB (ref 1.010–1.025)
Urobilinogen, UA: 0.2 E.U./dL
pH, UA: 6 (ref 5.0–8.0)

## 2019-09-22 MED ORDER — CEPHALEXIN 500 MG PO CAPS: 500.0000 mg | ORAL_CAPSULE | Freq: Three times a day (TID) | ORAL | 0 refills | Status: AC

## 2019-09-22 NOTE — Progress Notes (Signed)
Krystal Craig is a 86 y.o. female here for UTI symptoms.   I acted as a Education administrator for [employer], PA-C [employer], [place]  History of Present Illness:   Chief Complaint  Patient presents with  . Urinary Tract Infection    HPI   UTI Symptoms  Patient feels an irritation and minor burning while urinating. Denies frequency. Sx started a couple of days ago. Feels like she cannot empty her bladder all the way. Denies: n/v, lower back pain, malaise, fever, chills, vaginal discharge.  Cough Hx of L lung cancer s/p LLLobectomy in 2006. She was seeing Dr. Arlyce Dice (thoracic surgeon) was doing yearly CXR and CT chest q other year back in 2018. She last saw Dr. Lenna Gilford in pulmonology in Apri 2019. She was seeing pulmonology yearly prior to this however I do not see that she has followed up. Today she tells me that she has some recent cough. Dry. She is a very poor historian due to recent memory loss and decline. No recent unintentional weight loss, night sweats, hemoptysis.  Wt Readings from Last 10 Encounters:  09/22/19 146 lb (66.2 kg)  09/20/19 147 lb 9.6 oz (67 kg)  06/23/19 133 lb 8 oz (60.6 kg)  05/18/19 133 lb (60.3 kg)  04/07/19 130 lb 6.4 oz (59.1 kg)  03/22/19 130 lb (59 kg)  02/12/19 128 lb (58.1 kg)  02/03/19 132 lb (59.9 kg)  02/02/19 131 lb 9.6 oz (59.7 kg)  01/25/19 132 lb 6.1 oz (60 kg)      Past Medical History:  Diagnosis Date  . Allergic rhinitis   . Anemia   . Aortic atherosclerosis 05/26/2018  . Barrett's esophagus with dysplasia 04/07/2019  . CKD (chronic kidney disease), stage III 02/28/2014  . Complication of anesthesia   . Degenerative arthritis of lumbar spine 05/26/2018   Evaluation by Dr. Ernestina Patches in 9/19. A/P: Chronic history of low back pain and radicular pain status post lumbar discectomy and laminectomy by Dr. Melina Schools in 2016.  No recent MRI imaging but no real red flag complaints at this point other than she is getting pain in the low back that is  problematic over time it just has her at this point where she is limited in certain things she can do in its   . Diaphragmatic hernia 06/26/2007  . Diverticulosis   . GERD (gastroesophageal reflux disease) 06/26/2007  . Hiatal hernia   . History of bronchitis   . History of cancer of lung 2006   2006 20% of left lung resected. Former smoker. Dr. Lenna Gilford was doing CXR yearly. 09/2017 IMPRESSION: No interval change.  No acute cardiopulmonary findings. Postsurgical change LEFT lung base.  Marland Kitchen Hyperlipidemia 09/25/2011  . IBS (irritable bowel syndrome)   . Impacted cerumen of both ears   . Insomnia   . Insulin resistance 05/31/2018  . Irritable bowel syndrome 06/26/2007  . Major depression disorder in remission   . Major neurocognitive disorder ([place]) 07/30/2019  . Osteoarthritis   . Osteopenia   . PONV (postoperative nausea and vomiting)   . Pre-diabetes   . Primary osteoarthritis of both first carpometacarpal joints 12/28/2015  . Psoriasis   . Pure hypercholesterolemia   . Situational anxiety 11/06/2016  . Thoracic lymphadenopathy 09/25/2011  . Vitamin D deficiency      Social History   Socioeconomic History  . Marital status: Married    Spouse name: Not on file  . Number of children: Not on file  . Years of education:  13  . Highest education level: Some college, no degree  Occupational History  . Occupation: Retired  Tobacco Use  . Smoking status: Former Smoker    Packs/day: 1.00    Years: 30.00    Pack years: 30.00    Types: Cigarettes    Quit date: 06/17/1984    Years since quitting: 35.2  . Smokeless tobacco: Never Used  Substance and Sexual Activity  . Alcohol use: No    Alcohol/week: 0.0 standard drinks  . Drug use: No  . Sexual activity: Not Currently    Birth control/protection: Post-menopausal    Comment: 1st intercourse 12 yo-2 partners  Other Topics Concern  . Not on file  Social History Narrative   Married (2nd marriage; 1st husband died at age 25) married 51 29  years in 05/2014. 2 sons from first marriage, 3 from 2nd (step). 15 grandchildren. 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 Hobbies: reading, golf, family time.   [name] is POA   Right handed   Lives in single story home with husband   Social Determinants of Health   Financial Resource Strain:   . Difficulty of Paying Living Expenses:   Food Insecurity:   . Worried About Charity fundraiser in the Last Year:   . Arboriculturist in the Last Year:   Transportation Needs:   . Film/video editor (Medical):   Marland Kitchen Lack of Transportation (Non-Medical):   Physical Activity:   . Days of Exercise per Week:   . Minutes of Exercise per Session:   Stress:   . Feeling of Stress :   Social Connections:   . Frequency of Communication with Friends and Family:   . Frequency of Social Gatherings with Friends and Family:   . Attends Religious Services:   . Active Member of Clubs or Organizations:   . Attends Archivist Meetings:   Marland Kitchen Marital Status:   Intimate Partner Violence:   . Fear of Current or Ex-Partner:   . Emotionally Abused:   Marland Kitchen Physically Abused:   . Sexually Abused:     Past Surgical History:  Procedure Laterality Date  . ABDOMINAL HYSTERECTOMY  1969  . APPENDECTOMY    . bilateral shouler operations for impingement syndromes Bilateral 2001  . BUNIONECTOMY Left   . CATARACT EXTRACTION, BILATERAL Bilateral 2012  . CHOLECYSTECTOMY  1976  . COLONOSCOPY    . LAPAROSCOPIC LYSIS INTESTINAL ADHESIONS    . left lower lobectomy  11/2004   via VATS and mini-thoractomy for 1.5cm adenoca of lung by Dr. Arlyce Dice  . LUMBAR LAMINECTOMY/DECOMPRESSION MICRODISCECTOMY Left 01/05/2015   Procedure: L3-4 DECOMPRESSION MICRODISCECTOMY ON LEFT;  Surgeon: Melina Schools, MD;  Location: [place] [facility];  Service: Orthopedics;  Laterality: Left;  left Lumbar 3-4  . POLYPECTOMY    . turmor ear Right 09/2013  . urologic sugery with cystocele/rectocele repairs and sling  03/2007   Dr. Terance Hart    Family History   Problem Relation Age of Onset  . Breast cancer Sister 36  . Memory loss Sister   . Dementia Mother   . Heart attack Mother   . CVA Father   . Stroke Father   . Colon cancer Neg Hx   . Esophageal cancer Neg Hx   . Pancreatic cancer Neg Hx   . Rectal cancer Neg Hx   . Stomach cancer Neg Hx   . Colon polyps Neg Hx     Allergies  Allergen Reactions  . Ciprocin-Fluocin-Procin [Fluocinolone] Other (See Comments)  Leg and arm numbness  . Codeine     Chest pain/tightness  . Wellbutrin [Bupropion] Rash    Current Medications:   Current Outpatient Medications:  .  acetaminophen (TYLENOL ARTHRITIS PAIN) 650 MG CR tablet, Take 650 mg by mouth every 8 (eight) hours as needed for pain., Disp: , Rfl:  .  aspirin 81 MG tablet, Take 81 mg by mouth daily., Disp: , Rfl:  .  Calcium Carbonate-Vitamin D (CALCIUM-VITAMIN D) 500-200 MG-UNIT tablet, Take 1 tablet by mouth daily., Disp: , Rfl:  .  clonazePAM (KLONOPIN) 0.5 MG tablet, Take 1 tablet (0.5 mg total) by mouth 2 (two) times daily as needed for anxiety., Disp: 60 tablet, Rfl: 1 .  Multiple Vitamins-Minerals (MULTIVITAMIN WITH MINERALS) tablet, Take 1 tablet by mouth daily., Disp: , Rfl:  .  ondansetron (ZOFRAN) 4 MG tablet, TAKE 1 TABLET BY MOUTH THREE TIMES DAILY, Disp: 90 tablet, Rfl: 0 .  Probiotic Product (PROBIOTIC PO), Take 1 tablet by mouth daily., Disp: , Rfl:  .  simvastatin (ZOCOR) 40 MG tablet, TAKE 1 TABLET BY MOUTH AT BEDTIME, Disp: 90 tablet, Rfl: 0 .  zolpidem (AMBIEN) 10 MG tablet, Take 1 tablet (10 mg total) by mouth at bedtime as needed. for sleep (Patient taking differently: Take 5 mg by mouth at bedtime as needed. for sleep), Disp: 30 tablet, Rfl: 4 .  cephALEXin (KEFLEX) 500 MG capsule, Take 1 capsule (500 mg total) by mouth 3 (three) times daily for 5 days., Disp: 15 capsule, Rfl: 0 .  donepezil (ARICEPT) 10 MG tablet, Take 1/2 tablet for 2 weeks, then increase to 1 tablet daily (Patient not taking: Reported on  09/22/2019), Disp: 30 tablet, Rfl: 11 .  escitalopram (LEXAPRO) 10 MG tablet, Take 1 tablet by mouth once daily (Patient not taking: Reported on 09/22/2019), Disp: 90 tablet, Rfl: 0 .  esomeprazole (NEXIUM) 20 MG capsule, Take 1 capsule (20 mg total) by mouth daily at 12 noon. (Patient not taking: Reported on 09/22/2019), Disp: 30 capsule, Rfl: 11   Review of Systems:   ROS  Negative unless otherwise specified per HPI.  Vitals:   Vitals:   09/22/19 1306  BP: 122/72  Pulse: 74  Temp: 98.2 F (36.8 C)  TempSrc: Temporal  SpO2: 92%  Weight: 146 lb (66.2 kg)  Height: 5\' 3"  (1.6 m)     SpO2 rechecked at 94%  Body mass index is 25.86 kg/m.  Physical Exam:   Physical Exam Vitals and nursing note reviewed.  Constitutional:      General: She is not in acute distress.    Appearance: Normal appearance. She is well-developed. She is not ill-appearing or toxic-appearing.  Cardiovascular:     Rate and Rhythm: Normal rate and regular rhythm.     Pulses: Normal pulses.     Heart sounds: Normal heart sounds, S1 normal and S2 normal.  Pulmonary:     Effort: Pulmonary effort is normal.     Breath sounds: Normal breath sounds.  Abdominal:     General: Bowel sounds are normal.     Tenderness: There is no abdominal tenderness. There is no right CVA tenderness or left CVA tenderness.  Skin:    General: Skin is warm and dry.  Neurological:     Mental Status: She is alert.     GCS: GCS eye subscore is 4. GCS verbal subscore is 5. GCS motor subscore is 6.  Psychiatric:        Speech: Speech normal.  Behavior: Behavior normal. Behavior is cooperative.    Results for orders placed or performed in visit on 09/22/19  POCT Urinalysis Dipstick (Automated)  Result Value Ref Range   Color, UA yellow    Clarity, UA cloudy    Glucose, UA Negative Negative   Bilirubin, UA Negative    Ketones, UA Negative    Spec Grav, UA <=1.005 (A) 1.010 - 1.025   Blood, UA 1    pH, UA 6.0 5.0 - 8.0    Protein, UA Positive (A) Negative   Urobilinogen, UA 0.2 0.2 or 1.0 E.U./dL   Nitrite, UA Negative    Leukocytes, UA Large (3+) (A) Negative     Assessment and Plan:   Cynde was seen today for urinary tract infection.  Diagnoses and all orders for this visit:  UTI symptoms Symptoms and UA concerning for acute uncomplicated cystitis. Will start oral keflex. Culture pending. Follow-up in any worsening symptoms in the interim. -     Urine Culture -     POCT Urinalysis Dipstick (Automated)  Cough Update CXR today. Will have patient follow-up with pulmonology -- I'm unsure why she was lost to follow-up. -     DG Chest 2 View; Future  Other orders -     cephALEXin (KEFLEX) 500 MG capsule; Take 1 capsule (500 mg total) by mouth 3 (three) times daily for 5 days.  . Reviewed expectations re: course of current medical issues. . Discussed self-management of symptoms. . Outlined signs and symptoms indicating need for more acute intervention. . Patient verbalized understanding and all questions were answered. . See orders for this visit as documented in the electronic medical record. . Patient received an After-Visit Summary.  CMA or LPN served as scribe during this visit. History, Physical, and Plan performed by medical provider. The above documentation has been reviewed and is accurate and complete.  [name], PA-C

## 2019-09-22 NOTE — Patient Instructions (Signed)
It was great to see you!  Start oral keflex antibiotic.  I will be in touch with your chest xray results and plan for your cough.  General instructions  Make sure you: ? Pee until your bladder is empty. ? Do not hold pee for a long time. ? Empty your bladder after sex. ? Wipe from front to back after pooping if you are a female. Use each tissue one time when you wipe.  Drink enough fluid to keep your pee pale yellow.  Keep all follow-up visits as told by your doctor. This is important. Contact a doctor if:  You do not get better after 1-2 days.  Your symptoms go away and then come back. Get help right away if:  You have very bad back pain.  You have very bad pain in your lower belly.  You have a fever.  You are sick to your stomach (nauseous).  You are throwing up.  Take care,  [name] PA-C

## 2019-09-24 ENCOUNTER — Other Ambulatory Visit: Payer: Self-pay

## 2019-09-24 DIAGNOSIS — Z85118 Personal history of other malignant neoplasm of bronchus and lung: Secondary | ICD-10-CM

## 2019-09-25 LAB — URINE CULTURE
MICRO NUMBER:: 10337300
SPECIMEN QUALITY:: ADEQUATE

## 2019-12-07 ENCOUNTER — Telehealth: Payer: Medicare Other | Admitting: Physician Assistant

## 2019-12-07 NOTE — Progress Notes (Deleted)
Virtual Visit via Video   I connected with Krystal Craig on 12/07/19 at 12:00 PM EDT by a video enabled telemedicine application and verified that I am speaking with the correct person using two identifiers. Location patient: Home Location provider: [facility], Office Persons participating in the virtual visit: Shalaya, Swailes PA-C, ***Anselmo Pickler, LPN   I discussed the limitations of evaluation and management by telemedicine and the availability of in person appointments. The patient expressed understanding and agreed to proceed.  SCRIBE ATTESTATION  Subjective:   HPI:  Cough  ROS: See pertinent positives and negatives per HPI.  Patient Active Problem List   Diagnosis Date Noted  . Major neurocognitive disorder ([place]) 07/30/2019  . Barrett's esophagus with dysplasia 04/07/2019  . Age-related osteoporosis without current pathological fracture, does not want treatment 05/31/2018  . Insulin resistance 05/31/2018  . Abnormal findings on diagnostic imaging of other abdominal regions, liver and pancreas 05/31/2018  . Aortic atherosclerosis ([place]) 05/26/2018  . Degenerative arthritis of lumbar spine 05/26/2018  . Major depressive disorder 12/22/2017  . Diverticulosis 11/11/2016  . Hx of cancer of lung, s/p left lobectomy 2006 11/11/2016  . Situational anxiety 11/06/2016  . Primary osteoarthritis of both first carpometacarpal joints 12/28/2015  . Microscopic hematuria, with previous evaluation by Urology and determined to be benign 09/12/2014  . Psoriasis 02/28/2014  . CKD (chronic kidney disease), stage III 02/28/2014  . Thoracic lymphadenopathy 09/25/2011  . Hyperlipidemia 09/25/2011  . Insomnia 08/03/2008  . GERD (gastroesophageal reflux disease) 06/26/2007  . Irritable bowel syndrome 06/26/2007    Social History   Tobacco Use  . Smoking status: Former Smoker    Packs/day: 1.00    Years: 30.00    Pack years: 30.00    Types: Cigarettes    Quit  date: 06/17/1984    Years since quitting: 35.4  . Smokeless tobacco: Never Used  Substance Use Topics  . Alcohol use: No    Alcohol/week: 0.0 standard drinks    Current Outpatient Medications:  .  acetaminophen (TYLENOL ARTHRITIS PAIN) 650 MG CR tablet, Take 650 mg by mouth every 8 (eight) hours as needed for pain., Disp: , Rfl:  .  aspirin 81 MG tablet, Take 81 mg by mouth daily., Disp: , Rfl:  .  Calcium Carbonate-Vitamin D (CALCIUM-VITAMIN D) 500-200 MG-UNIT tablet, Take 1 tablet by mouth daily., Disp: , Rfl:  .  clonazePAM (KLONOPIN) 0.5 MG tablet, Take 1 tablet (0.5 mg total) by mouth 2 (two) times daily as needed for anxiety., Disp: 60 tablet, Rfl: 1 .  donepezil (ARICEPT) 10 MG tablet, Take 1/2 tablet for 2 weeks, then increase to 1 tablet daily (Patient not taking: Reported on 09/22/2019), Disp: 30 tablet, Rfl: 11 .  escitalopram (LEXAPRO) 10 MG tablet, Take 1 tablet by mouth once daily (Patient not taking: Reported on 09/22/2019), Disp: 90 tablet, Rfl: 0 .  esomeprazole (NEXIUM) 20 MG capsule, Take 1 capsule (20 mg total) by mouth daily at 12 noon. (Patient not taking: Reported on 09/22/2019), Disp: 30 capsule, Rfl: 11 .  Multiple Vitamins-Minerals (MULTIVITAMIN WITH MINERALS) tablet, Take 1 tablet by mouth daily., Disp: , Rfl:  .  ondansetron (ZOFRAN) 4 MG tablet, TAKE 1 TABLET BY MOUTH THREE TIMES DAILY, Disp: 90 tablet, Rfl: 0 .  Probiotic Product (PROBIOTIC PO), Take 1 tablet by mouth daily., Disp: , Rfl:  .  simvastatin (ZOCOR) 40 MG tablet, TAKE 1 TABLET BY MOUTH AT BEDTIME, Disp: 90 tablet, Rfl: 0 .  zolpidem (  AMBIEN) 10 MG tablet, Take 1 tablet (10 mg total) by mouth at bedtime as needed. for sleep (Patient taking differently: Take 5 mg by mouth at bedtime as needed. for sleep), Disp: 30 tablet, Rfl: 4  Allergies  Allergen Reactions  . Ciprocin-Fluocin-Procin [Fluocinolone] Other (See Comments)    Leg and arm numbness  . Codeine     Chest pain/tightness  . Wellbutrin [Bupropion]  Rash    Objective:   VITALS: Per patient if applicable, see vitals. GENERAL: Alert, appears well and in no acute distress. HEENT: Atraumatic, conjunctiva clear, no obvious abnormalities on inspection of external nose and ears. NECK: Normal movements of the head and neck. CARDIOPULMONARY: No increased WOB. Speaking in clear sentences. I:E ratio WNL.  MS: Moves all visible extremities without noticeable abnormality. PSYCH: Pleasant and cooperative, well-groomed. Speech normal rate and rhythm. Affect is appropriate. Insight and judgement are appropriate. Attention is focused, linear, and appropriate.  NEURO: CN grossly intact. Oriented as arrived to appointment on time with no prompting. Moves both UE equally.  SKIN: No obvious lesions, wounds, erythema, or cyanosis noted on face or hands.  Assessment and Plan:   There are no diagnoses linked to this encounter.  . Reviewed expectations re: course of current medical issues. . Discussed self-management of symptoms. . Outlined signs and symptoms indicating need for more acute intervention. . Patient verbalized understanding and all questions were answered. Marland Kitchen Health Maintenance issues including appropriate healthy diet, exercise, and smoking avoidance were discussed with patient. . See orders for this visit as documented in the electronic medical record.  I discussed the assessment and treatment plan with the patient. The patient was provided an opportunity to ask questions and all were answered. The patient agreed with the plan and demonstrated an understanding of the instructions.   The patient was advised to call back or seek an in-person evaluation if the symptoms worsen or if the condition fails to improve as anticipated.   ***  Anselmo Pickler, LPN [id number]/48/[id number]

## 2019-12-08 ENCOUNTER — Other Ambulatory Visit: Payer: Self-pay

## 2019-12-08 ENCOUNTER — Encounter: Payer: Self-pay | Admitting: Emergency Medicine

## 2019-12-08 ENCOUNTER — Ambulatory Visit: Payer: Medicare Other | Admitting: Emergency Medicine

## 2019-12-08 ENCOUNTER — Ambulatory Visit (INDEPENDENT_AMBULATORY_CARE_PROVIDER_SITE_OTHER): Payer: Medicare Other

## 2019-12-08 DIAGNOSIS — R05 Cough: Secondary | ICD-10-CM

## 2019-12-08 DIAGNOSIS — R053 Chronic cough: Secondary | ICD-10-CM

## 2019-12-08 DIAGNOSIS — J90 Pleural effusion, not elsewhere classified: Secondary | ICD-10-CM | POA: Diagnosis not present

## 2019-12-08 DIAGNOSIS — J189 Pneumonia, unspecified organism: Secondary | ICD-10-CM | POA: Diagnosis not present

## 2019-12-08 DIAGNOSIS — C3490 Malignant neoplasm of unspecified part of unspecified bronchus or lung: Secondary | ICD-10-CM | POA: Diagnosis not present

## 2019-12-08 MED ORDER — OMEPRAZOLE 20 MG PO CPDR
20.0000 mg | DELAYED_RELEASE_CAPSULE | Freq: Every day | ORAL | 1 refills | Status: DC
Start: 2019-12-08 — End: 2020-12-25

## 2019-12-08 MED ORDER — LORATADINE 10 MG PO TABS
10.0000 mg | ORAL_TABLET | Freq: Every day | ORAL | 1 refills | Status: DC
Start: 2019-12-08 — End: 2019-12-13

## 2019-12-08 NOTE — Progress Notes (Signed)
Subjective:    Patient ID: Krystal Craig, female    DOB: 25-Jan-1933, 86 y.o.   MRN: [id number]  HPI 86 year old former smoker (30 pack years) with a history of left lower lobectomy (Dr. Arlyce Dice) 2006 for adenocarcinoma.  Also with diaphragmatic hernia, GERD with Barrett's esophagus, CKD stage III, psoriasis, allergic rhinitis, anxiety/depression, ? dementia.   She reports that she began to have cough about 6 weeks, feels a fullness in her throat, globus sensation. Non-productive. Hasn't lost her voice. Does not recall any precipitating event. Can happen at any time. Doesn't seem to happen at night, after meals. No wheeze. She hasn't tried any meds.  Her granddaughter or son manages her medications.   Chest x-ray performed 09/22/2019 reviewed by me shows blunting of the left hemidiaphragm but otherwise no infiltrates, no acute findings.   Review of Systems As per HPI  Past Medical History:  Diagnosis Date  . Allergic rhinitis   . Anemia   . Aortic atherosclerosis 05/26/2018  . Barrett's esophagus with dysplasia 04/07/2019  . CKD (chronic kidney disease), stage III 02/28/2014  . Complication of anesthesia   . Degenerative arthritis of lumbar spine 05/26/2018   Evaluation by Dr. Ernestina Patches in 9/19. A/P: Chronic history of low back pain and radicular pain status post lumbar discectomy and laminectomy by Dr. Melina Schools in 2016.  No recent MRI imaging but no real red flag complaints at this point other than she is getting pain in the low back that is problematic over time it just has her at this point where she is limited in certain things she can do in its   . Diaphragmatic hernia 06/26/2007  . Diverticulosis   . GERD (gastroesophageal reflux disease) 06/26/2007  . Hiatal hernia   . History of bronchitis   . History of cancer of lung 2006   2006 20% of left lung resected. Former smoker. Dr. Lenna Gilford was doing CXR yearly. 09/2017 IMPRESSION: No interval change.  No acute cardiopulmonary findings.  Postsurgical change LEFT lung base.  Marland Kitchen Hyperlipidemia 09/25/2011  . IBS (irritable bowel syndrome)   . Impacted cerumen of both ears   . Insomnia   . Insulin resistance 05/31/2018  . Irritable bowel syndrome 06/26/2007  . Major depression disorder in remission   . Major neurocognitive disorder ([place]) 07/30/2019  . Osteoarthritis   . Osteopenia   . PONV (postoperative nausea and vomiting)   . Pre-diabetes   . Primary osteoarthritis of both first carpometacarpal joints 12/28/2015  . Psoriasis   . Pure hypercholesterolemia   . Situational anxiety 11/06/2016  . Thoracic lymphadenopathy 09/25/2011  . Vitamin D deficiency      Family History  Problem Relation Age of Onset  . Breast cancer Sister 63  . Memory loss Sister   . Dementia Mother   . Heart attack Mother   . CVA Father   . Stroke Father   . Colon cancer Neg Hx   . Esophageal cancer Neg Hx   . Pancreatic cancer Neg Hx   . Rectal cancer Neg Hx   . Stomach cancer Neg Hx   . Colon polyps Neg Hx      Social History   Socioeconomic History  . Marital status: Married    Spouse name: Not on file  . Number of children: Not on file  . Years of education: 73  . Highest education level: Some college, no degree  Occupational History  . Occupation: Retired  Tobacco Use  . Smoking status: Former Smoker  Packs/day: 1.00    Years: 30.00    Pack years: 30.00    Types: Cigarettes    Quit date: 06/17/1984    Years since quitting: 35.4  . Smokeless tobacco: Never Used  Vaping Use  . Vaping Use: Never used  Substance and Sexual Activity  . Alcohol use: No    Alcohol/week: 0.0 standard drinks  . Drug use: No  . Sexual activity: Not Currently    Birth control/protection: Post-menopausal    Comment: 1st intercourse 77 yo-2 partners  Other Topics Concern  . Not on file  Social History Narrative   Married (2nd marriage; 1st husband died at age 16) married 74 29 years in 05/2014. 2 sons from first marriage, 3 from 2nd (step). 15  grandchildren. 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 Hobbies: reading, golf, family time.   [name] is POA   Right handed   Lives in single story home with husband   Social Determinants of Health   Financial Resource Strain:   . Difficulty of Paying Living Expenses:   Food Insecurity:   . Worried About Charity fundraiser in the Last Year:   . Arboriculturist in the Last Year:   Transportation Needs:   . Film/video editor (Medical):   Marland Kitchen Lack of Transportation (Non-Medical):   Physical Activity:   . Days of Exercise per Week:   . Minutes of Exercise per Session:   Stress:   . Feeling of Stress :   Social Connections:   . Frequency of Communication with Friends and Family:   . Frequency of Social Gatherings with Friends and Family:   . Attends Religious Services:   . Active Member of Clubs or Organizations:   . Attends Archivist Meetings:   Marland Kitchen Marital Status:   Intimate Partner Violence:   . Fear of Current or Ex-Partner:   . Emotionally Abused:   Marland Kitchen Physically Abused:   . Sexually Abused:      Allergies  Allergen Reactions  . Ciprocin-Fluocin-Procin [Fluocinolone] Other (See Comments)    Leg and arm numbness  . Codeine     Chest pain/tightness  . Wellbutrin [Bupropion] Rash     Outpatient Medications Prior to Visit  Medication Sig Dispense Refill  . acetaminophen (TYLENOL ARTHRITIS PAIN) 650 MG CR tablet Take 650 mg by mouth every 8 (eight) hours as needed for pain.    Marland Kitchen aspirin 81 MG tablet Take 81 mg by mouth daily.    . Calcium Carbonate-Vitamin D (CALCIUM-VITAMIN D) 500-200 MG-UNIT tablet Take 1 tablet by mouth daily.    . clonazePAM (KLONOPIN) 0.5 MG tablet Take 1 tablet (0.5 mg total) by mouth 2 (two) times daily as needed for anxiety. 60 tablet 1  . esomeprazole (NEXIUM) 20 MG capsule Take 1 capsule (20 mg total) by mouth daily at 12 noon. 30 capsule 11  . Multiple Vitamins-Minerals (MULTIVITAMIN WITH MINERALS) tablet Take 1 tablet by mouth daily.    .  ondansetron (ZOFRAN) 4 MG tablet TAKE 1 TABLET BY MOUTH THREE TIMES DAILY 90 tablet 0  . simvastatin (ZOCOR) 40 MG tablet TAKE 1 TABLET BY MOUTH AT BEDTIME 90 tablet 0  . zolpidem (AMBIEN) 10 MG tablet Take 1 tablet (10 mg total) by mouth at bedtime as needed. for sleep (Patient taking differently: Take 5 mg by mouth at bedtime as needed. for sleep) 30 tablet 4  . donepezil (ARICEPT) 10 MG tablet Take 1/2 tablet for 2 weeks, then increase to 1 tablet  daily (Patient not taking: Reported on 09/22/2019) 30 tablet 11  . escitalopram (LEXAPRO) 10 MG tablet Take 1 tablet by mouth once daily (Patient not taking: Reported on 09/22/2019) 90 tablet 0  . Probiotic Product (PROBIOTIC PO) Take 1 tablet by mouth daily. (Patient not taking: Reported on 12/08/2019)     No facility-administered medications prior to visit.        Objective:   Physical Exam Vitals:   12/08/19 1057  BP: 140/76  Pulse: 84  Temp: 98.1 F (36.7 C)  TempSrc: Oral  SpO2: 98%  Weight: 151 lb 6.4 oz (68.7 kg)  Height: 5' 4.17" (1.63 m)   Gen: Pleasant, well-nourished, in no distress,  normal affect  ENT: No lesions,  mouth clear,  oropharynx clear, no postnasal drip  Neck: No JVD, no stridor  Lungs: No use of accessory muscles, no crackles or wheezing on normal respiration, no wheeze on forced expiration  Cardiovascular: RRR, heart sounds normal, no murmur or gallops, no peripheral edema  Musculoskeletal: No deformities, no cyanosis or clubbing  Neuro: alert, awake, poor memory, poor history giver non focal  Skin: Warm, no lesions or rash       Assessment & Plan:  Chronic cough Difficult history giver due to her poor memory.  She does not recall any precipitating event.  I think it would be reasonable for [name] to treat the common contributor since we know she has a history of GERD, history of allergic rhinitis.  I will start medications for both.  Chest x-ray today to compare with April.  Depending on her improvement,  the chest x-ray, we may decide to perform a CT scan of the chest to further evaluate, ensure no evidence of subtle recurrence of her malignancy.  It has been 15 years and this is low likelihood.  Also may consider pulmonary function testing or even airway inspection depending on persistence of her cough.  Chest x-ray today to compare with your prior Please start omeprazole 20 mg once a day until next visit.  Take this medication 1 hour around food. Please start loratadine 10 mg once a day until next visit. Depending on how your cough does and how your chest x-ray looks.  We will decide if any other work-up is indicated at your next visit. Follow with Dr Lamonte Sakai in 1 month or next available  Baltazar Apo, MD, PhD 12/08/2019, 11:27 AM [facility] [phone] or if no answer [phone]

## 2019-12-08 NOTE — Assessment & Plan Note (Signed)
Difficult history giver due to her poor memory.  She does not recall any precipitating event.  I think it would be reasonable for [name] to treat the common contributor since we know she has a history of GERD, history of allergic rhinitis.  I will start medications for both.  Chest x-ray today to compare with April.  Depending on her improvement, the chest x-ray, we may decide to perform a CT scan of the chest to further evaluate, ensure no evidence of subtle recurrence of her malignancy.  It has been 15 years and this is low likelihood.  Also may consider pulmonary function testing or even airway inspection depending on persistence of her cough.  Chest x-ray today to compare with your prior Please start omeprazole 20 mg once a day until next visit.  Take this medication 1 hour around food. Please start loratadine 10 mg once a day until next visit. Depending on how your cough does and how your chest x-ray looks.  We will decide if any other work-up is indicated at your next visit. Follow with Dr Lamonte Sakai in 1 month or next available

## 2019-12-08 NOTE — Patient Instructions (Signed)
Chest x-ray today to compare with your prior Please start omeprazole 20 mg once a day until next visit.  Take this medication 1 hour around food. Please start loratadine 10 mg once a day until next visit. Depending on how your cough does and how your chest x-ray looks.  We will decide if any other work-up is indicated at your next visit. Follow with Dr Lamonte Sakai in 1 month or next available

## 2019-12-09 ENCOUNTER — Other Ambulatory Visit: Payer: Self-pay | Admitting: Physician Assistant

## 2019-12-09 DIAGNOSIS — F5101 Primary insomnia: Secondary | ICD-10-CM

## 2019-12-10 NOTE — Telephone Encounter (Signed)
LAST APPOINTMENT DATE: 09/22/2019   NEXT APPOINTMENT DATE: N/A    LAST REFILL: 07/14/2019  QTY: 60 tablet

## 2019-12-13 ENCOUNTER — Ambulatory Visit (INDEPENDENT_AMBULATORY_CARE_PROVIDER_SITE_OTHER): Payer: Medicare Other

## 2019-12-13 ENCOUNTER — Other Ambulatory Visit: Payer: Self-pay

## 2019-12-13 DIAGNOSIS — Z0000 Encounter for general adult medical examination without abnormal findings: Secondary | ICD-10-CM

## 2019-12-13 NOTE — Patient Instructions (Addendum)
Krystal Craig , Thank you for taking time to come for your Medicare Wellness Visit. I appreciate your ongoing commitment to your health goals. Please review the following plan we discussed and let me know if I can assist you in the future.   Screening recommendations/referrals: Colonoscopy: Done 02/08/14 Mammogram: Done 12/16/18 Bone Density: Done 10/04/14 Recommended yearly ophthalmology/optometry visit for glaucoma screening and checkup Recommended yearly dental visit for hygiene and checkup  Vaccinations: Influenza vaccine: Up to date Pneumococcal vaccine: Up to date Tdap vaccine: Up to date Shingles vaccine: Done 5/24 & 01/08/18 Covid-19:Completed 2/5/ & 08/17/19  Advanced directives: Pt has copy in chart  Conditions/risks identified: Pt states she will "try to walk more"  Next appointment: Follow up in one year for your annual wellness visit    Preventive Care 41 Years and Older, Female Preventive care refers to lifestyle choices and visits with your health care provider that can promote health and wellness. What does preventive care include?  A yearly physical exam. This is also called an annual well check.  Dental exams once or twice a year.  Routine eye exams. Ask your health care provider how often you should have your eyes checked.  Personal lifestyle choices, including:  Daily care of your teeth and gums.  Regular physical activity.  Eating a healthy diet.  Avoiding tobacco and drug use.  Limiting alcohol use.  Practicing safe sex.  Taking low-dose aspirin every day.  Taking vitamin and mineral supplements as recommended by your health care provider. What happens during an annual well check? The services and screenings done by your health care provider during your annual well check will depend on your age, overall health, lifestyle risk factors, and family history of disease. Counseling  Your health care provider may ask you questions about your:  Alcohol  use.  Tobacco use.  Drug use.  Emotional well-being.  Home and relationship well-being.  Sexual activity.  Eating habits.  History of falls.  Memory and ability to understand (cognition).  Work and work Statistician.  Reproductive health. Screening  You may have the following tests or measurements:  Height, weight, and BMI.  Blood pressure.  Lipid and cholesterol levels. These may be checked every 5 years, or more frequently if you are over 16 years old.  Skin check.  Lung cancer screening. You may have this screening every year starting at age 70 if you have a 30-pack-year history of smoking and currently smoke or have quit within the past 15 years.  Fecal occult blood test (FOBT) of the stool. You may have this test every year starting at age 87.  Flexible sigmoidoscopy or colonoscopy. You may have a sigmoidoscopy every 5 years or a colonoscopy every 10 years starting at age 55.  Hepatitis C blood test.  Hepatitis B blood test.  Sexually transmitted disease (STD) testing.  Diabetes screening. This is done by checking your blood sugar (glucose) after you have not eaten for a while (fasting). You may have this done every 1-3 years.  Bone density scan. This is done to screen for osteoporosis. You may have this done starting at age 28.  Mammogram. This may be done every 1-2 years. Talk to your health care provider about how often you should have regular mammograms. Talk with your health care provider about your test results, treatment options, and if necessary, the need for more tests. Vaccines  Your health care provider may recommend certain vaccines, such as:  Influenza vaccine. This is recommended every year.  Tetanus, diphtheria, and acellular pertussis (Tdap, Td) vaccine. You may need a Td booster every 10 years.  Zoster vaccine. You may need this after age 11.  Pneumococcal 13-valent conjugate (PCV13) vaccine. One dose is recommended after age  6.  Pneumococcal polysaccharide (PPSV23) vaccine. One dose is recommended after age 69. Talk to your health care provider about which screenings and vaccines you need and how often you need them. This information is not intended to replace advice given to you by your health care provider. Make sure you discuss any questions you have with your health care provider. Document Released: 06/30/2015 Document Revised: 02/21/2016 Document Reviewed: 04/04/2015 Elsevier Interactive Patient Education  2017 [place] Prevention in the Home Falls can cause injuries. They can happen to people of all ages. There are many things you can do to make your home safe and to help prevent falls. What can I do on the outside of my home?  Regularly fix the edges of walkways and driveways and fix any cracks.  Remove anything that might make you trip as you walk through a door, such as a raised step or threshold.  Trim any bushes or trees on the path to your home.  Use bright outdoor lighting.  Clear any walking paths of anything that might make someone trip, such as rocks or tools.  Regularly check to see if handrails are loose or broken. Make sure that both sides of any steps have handrails.  Any raised decks and porches should have guardrails on the edges.  Have any leaves, snow, or ice cleared regularly.  Use sand or salt on walking paths during winter.  Clean up any spills in your garage right away. This includes oil or grease spills. What can I do in the bathroom?  Use night lights.  Install grab bars by the toilet and in the tub and shower. Do not use towel bars as grab bars.  Use non-skid mats or decals in the tub or shower.  If you need to sit down in the shower, use a plastic, non-slip stool.  Keep the floor dry. Clean up any water that spills on the floor as soon as it happens.  Remove soap buildup in the tub or shower regularly.  Attach bath mats securely with double-sided  non-slip rug tape.  Do not have throw rugs and other things on the floor that can make you trip. What can I do in the bedroom?  Use night lights.  Make sure that you have a light by your bed that is easy to reach.  Do not use any sheets or blankets that are too big for your bed. They should not hang down onto the floor.  Have a firm chair that has side arms. You can use this for support while you get dressed.  Do not have throw rugs and other things on the floor that can make you trip. What can I do in the kitchen?  Clean up any spills right away.  Avoid walking on wet floors.  Keep items that you use a lot in easy-to-reach places.  If you need to reach something above you, use a strong step stool that has a grab bar.  Keep electrical cords out of the way.  Do not use floor polish or wax that makes floors slippery. If you must use wax, use non-skid floor wax.  Do not have throw rugs and other things on the floor that can make you trip. What can I do  with my stairs?  Do not leave any items on the stairs.  Make sure that there are handrails on both sides of the stairs and use them. Fix handrails that are broken or loose. Make sure that handrails are as long as the stairways.  Check any carpeting to make sure that it is firmly attached to the stairs. Fix any carpet that is loose or worn.  Avoid having throw rugs at the top or bottom of the stairs. If you do have throw rugs, attach them to the floor with carpet tape.  Make sure that you have a light switch at the top of the stairs and the bottom of the stairs. If you do not have them, ask someone to add them for you. What else can I do to help prevent falls?  Wear shoes that:  Do not have high heels.  Have rubber bottoms.  Are comfortable and fit you well.  Are closed at the toe. Do not wear sandals.  If you use a stepladder:  Make sure that it is fully opened. Do not climb a closed stepladder.  Make sure that both  sides of the stepladder are locked into place.  Ask someone to hold it for you, if possible.  Clearly mark and make sure that you can see:  Any grab bars or handrails.  First and last steps.  Where the edge of each step is.  Use tools that help you move around (mobility aids) if they are needed. These include:  Canes.  Walkers.  Scooters.  Crutches.  Turn on the lights when you go into a dark area. Replace any light bulbs as soon as they burn out.  Set up your furniture so you have a clear path. Avoid moving your furniture around.  If any of your floors are uneven, fix them.  If there are any pets around you, be aware of where they are.  Review your medicines with your doctor. Some medicines can make you feel dizzy. This can increase your chance of falling. Ask your doctor what other things that you can do to help prevent falls. This information is not intended to replace advice given to you by your health care provider. Make sure you discuss any questions you have with your health care provider. Document Released: 03/30/2009 Document Revised: 11/09/2015 Document Reviewed: 07/08/2014 Elsevier Interactive Patient Education  2017 [employer].

## 2019-12-13 NOTE — Progress Notes (Addendum)
Virtual Visit via Telephone Note  I connected with  Krystal Craig on 12/13/19 at  2:00 PM EDT by telephone and verified that I am speaking with the correct person using two identifiers.  Medicare Annual Wellness visit completed telephonically due to Covid-19 pandemic.   Location: Patient: Home Provider: Office Person participating are Nurse Health Coach and Patient   I discussed the limitations, risks, security and privacy concerns of performing an evaluation and management service by telephone and the availability of in person appointments. The patient expressed understanding and agreed to proceed.  Unable to perform video visit due to video visit attempted and failed and/or patient does not have video capability.   Some vital signs may be absent or patient reported.   Willette Brace, LPN    Subjective:   Krystal Craig is a 86 y.o. female who presents for an Initial Medicare Annual Wellness Visit.  Location: Patient: Home Provider: Office Person participating are Nurse Health Coach and Patient     Review of Systems          Objective:    There were no vitals filed for this visit. There is no height or weight on file to calculate BMI.  Advanced Directives 05/18/2019 01/15/2019 12/08/2018 10/09/2017 10/02/2016 09/15/2016 03/06/2015  Does Patient Have a Medical Advance Directive? Yes No No Yes Yes Yes No  Type of Advance Directive Living will;Healthcare Power of [place];Living will [place];Living will -  Does patient want to make changes to medical advance directive? - - - - - - -  Copy of [place] in Chart? - - - - No - copy requested No - copy requested -  Would patient like information on creating a medical advance directive? - No - Patient declined - - - - No - patient declined information    Current Medications (verified) Outpatient Encounter Medications as of 12/13/2019  Medication Sig    . acetaminophen (TYLENOL ARTHRITIS PAIN) 650 MG CR tablet Take 650 mg by mouth every 8 (eight) hours as needed for pain.  Marland Kitchen aspirin 81 MG tablet Take 81 mg by mouth daily.  . Calcium Carbonate-Vitamin D (CALCIUM-VITAMIN D) 500-200 MG-UNIT tablet Take 1 tablet by mouth daily.  . clonazePAM (KLONOPIN) 0.5 MG tablet Take 1 tablet by mouth twice daily as needed for anxiety  . donepezil (ARICEPT) 10 MG tablet Take 1/2 tablet for 2 weeks, then increase to 1 tablet daily (Patient not taking: Reported on 09/22/2019)  . escitalopram (LEXAPRO) 10 MG tablet Take 1 tablet by mouth once daily (Patient not taking: Reported on 09/22/2019)  . esomeprazole (NEXIUM) 20 MG capsule Take 1 capsule (20 mg total) by mouth daily at 12 noon.  . loratadine (CLARITIN) 10 MG tablet Take 1 tablet (10 mg total) by mouth daily.  . Multiple Vitamins-Minerals (MULTIVITAMIN WITH MINERALS) tablet Take 1 tablet by mouth daily.  Marland Kitchen omeprazole (PRILOSEC) 20 MG capsule Take 1 capsule (20 mg total) by mouth daily.  . ondansetron (ZOFRAN) 4 MG tablet TAKE 1 TABLET BY MOUTH THREE TIMES DAILY  . Probiotic Product (PROBIOTIC PO) Take 1 tablet by mouth daily. (Patient not taking: Reported on 12/08/2019)  . simvastatin (ZOCOR) 40 MG tablet TAKE 1 TABLET BY MOUTH AT BEDTIME  . zolpidem (AMBIEN) 10 MG tablet Take 1 tablet (10 mg total) by mouth at bedtime as needed. for sleep (Patient taking differently: Take 5 mg by mouth at bedtime as needed. for  sleep)   No facility-administered encounter medications on file as of 12/13/2019.    Allergies (verified) Ciprocin-fluocin-procin [fluocinolone], Codeine, and Wellbutrin [bupropion]   History: Past Medical History:  Diagnosis Date  . Allergic rhinitis   . Anemia   . Aortic atherosclerosis 05/26/2018  . Barrett's esophagus with dysplasia 04/07/2019  . CKD (chronic kidney disease), stage III 02/28/2014  . Complication of anesthesia   . Degenerative arthritis of lumbar spine 05/26/2018    Evaluation by Dr. Ernestina Patches in 9/19. A/P: Chronic history of low back pain and radicular pain status post lumbar discectomy and laminectomy by Dr. Melina Schools in 2016.  No recent MRI imaging but no real red flag complaints at this point other than she is getting pain in the low back that is problematic over time it just has her at this point where she is limited in certain things she can do in its   . Diaphragmatic hernia 06/26/2007  . Diverticulosis   . GERD (gastroesophageal reflux disease) 06/26/2007  . Hiatal hernia   . History of bronchitis   . History of cancer of lung 2006   2006 20% of left lung resected. Former smoker. Dr. Lenna Gilford was doing CXR yearly. 09/2017 IMPRESSION: No interval change.  No acute cardiopulmonary findings. Postsurgical change LEFT lung base.  Marland Kitchen Hyperlipidemia 09/25/2011  . IBS (irritable bowel syndrome)   . Impacted cerumen of both ears   . Insomnia   . Insulin resistance 05/31/2018  . Irritable bowel syndrome 06/26/2007  . Major depression disorder in remission   . Major neurocognitive disorder ([place]) 07/30/2019  . Osteoarthritis   . Osteopenia   . PONV (postoperative nausea and vomiting)   . Pre-diabetes   . Primary osteoarthritis of both first carpometacarpal joints 12/28/2015  . Psoriasis   . Pure hypercholesterolemia   . Situational anxiety 11/06/2016  . Thoracic lymphadenopathy 09/25/2011  . Vitamin D deficiency    Past Surgical History:  Procedure Laterality Date  . ABDOMINAL HYSTERECTOMY  1969  . APPENDECTOMY    . bilateral shouler operations for impingement syndromes Bilateral 2001  . BUNIONECTOMY Left   . CATARACT EXTRACTION, BILATERAL Bilateral 2012  . CHOLECYSTECTOMY  1976  . COLONOSCOPY    . LAPAROSCOPIC LYSIS INTESTINAL ADHESIONS    . left lower lobectomy  11/2004   via VATS and mini-thoractomy for 1.5cm adenoca of lung by Dr. Arlyce Dice  . LUMBAR LAMINECTOMY/DECOMPRESSION MICRODISCECTOMY Left 01/05/2015   Procedure: L3-4 DECOMPRESSION MICRODISCECTOMY ON  LEFT;  Surgeon: Melina Schools, MD;  Location: [facility];  Service: Orthopedics;  Laterality: Left;  left Lumbar 3-4  . POLYPECTOMY    . turmor ear Right 09/2013  . urologic sugery with cystocele/rectocele repairs and sling  03/2007   Dr. Terance Hart   Family History  Problem Relation Age of Onset  . Breast cancer Sister 25  . Memory loss Sister   . Dementia Mother   . Heart attack Mother   . CVA Father   . Stroke Father   . Colon cancer Neg Hx   . Esophageal cancer Neg Hx   . Pancreatic cancer Neg Hx   . Rectal cancer Neg Hx   . Stomach cancer Neg Hx   . Colon polyps Neg Hx    Social History   Socioeconomic History  . Marital status: Married    Spouse name: Not on file  . Number of children: Not on file  . Years of education: 68  . Highest education level: Some college, no degree  Occupational History  .  Occupation: Retired  Tobacco Use  . Smoking status: Former Smoker    Packs/day: 1.00    Years: 30.00    Pack years: 30.00    Types: Cigarettes    Quit date: 06/17/1984    Years since quitting: 35.5  . Smokeless tobacco: Never Used  Vaping Use  . Vaping Use: Never used  Substance and Sexual Activity  . Alcohol use: No    Alcohol/week: 0.0 standard drinks  . Drug use: No  . Sexual activity: Not Currently    Birth control/protection: Post-menopausal    Comment: 1st intercourse 55 yo-2 partners  Other Topics Concern  . Not on file  Social History Narrative   Married (2nd marriage; 1st husband died at age 84) married 14 29 years in 05/2014. 2 sons from first marriage, 3 from 2nd (step). 15 grandchildren. 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 Hobbies: reading, golf, family time.   [name] is POA   Right handed   Lives in single story home with husband   Social Determinants of Health   Financial Resource Strain:   . Difficulty of Paying Living Expenses:   Food Insecurity:   . Worried About Charity fundraiser in the Last Year:   . Arboriculturist in the Last Year:    Transportation Needs:   . Film/video editor (Medical):   Marland Kitchen Lack of Transportation (Non-Medical):   Physical Activity:   . Days of Exercise per Week:   . Minutes of Exercise per Session:   Stress:   . Feeling of Stress :   Social Connections:   . Frequency of Communication with Friends and Family:   . Frequency of Social Gatherings with Friends and Family:   . Attends Religious Services:   . Active Member of Clubs or Organizations:   . Attends Archivist Meetings:   Marland Kitchen Marital Status:     Tobacco Counseling Counseling given: Not Answered   Clinical Intake:                         Activities of Daily Living No flowsheet data found.  Patient Care Team: [name], [place] as PCP - General (Physician Assistant) Darleen Crocker, MD as Consulting Physician (Ophthalmology) Noralee Space, MD as Consulting Physician (Pulmonary Disease) Magnus Sinning, MD as Consulting Physician (Physical Medicine and Rehabilitation) Cameron Sprang, MD as Consulting Physician (Neurology)  Indicate any recent Medical Services you may have received from other than [facility] providers in the past year (date may be approximate).     Assessment:   This is a routine wellness examination for Ivalee.  Hearing/Vision screen No exam data present  Dietary issues and exercise activities discussed:    Goals    . DIET - EAT MORE FRUITS AND VEGETABLES    . DIET - INCREASE WATER INTAKE    . Maintain current health status    . Patient Stated     Self care and evaluating assistance for long term care giving role  Keep engaging outside       Depression Screen PHQ 2/9 Scores 03/22/2019 06/29/2018 04/27/2018 12/22/2017 10/09/2017 10/02/2016 09/12/2014  PHQ - 2 Score 4 0 0 2 0 0 0  PHQ- 9 Score 4 0 0 2 - - -    Fall Risk Fall Risk  05/18/2019 12/02/2018 10/09/2017 10/02/2016 09/12/2014  Falls in the past year? 0 0 No No No    Any stairs in or around the home? Yes  If so, are  there any without handrails? Yes  Home free of loose throw rugs in walkways, pet beds, electrical cords, etc? Yes  Adequate lighting in your home to reduce risk of falls? Yes   ASSISTIVE DEVICES UTILIZED TO PREVENT FALLS:  Life alert? No  Use of a cane, walker or w/c? No  Grab bars in the bathroom? Yes  Shower chair or bench in shower? No  Elevated toilet seat or a handicapped toilet? No   TIMED UP AND GO:  Was the test performed? No .      Cognitive Function:        Immunizations Immunization History  Administered Date(s) Administered  . Influenza Split 03/18/2011, 05/04/2012, 04/17/2017  . Influenza Whole 04/02/2006, 03/24/2009, 05/01/2010  . Influenza, High Dose Seasonal PF 03/25/2016, 03/20/2017, 03/18/2019  . Influenza,inj,Quad PF,6+ Mos 02/28/2014, 04/05/2015  . Influenza-Unspecified 03/17/2013, 02/19/2018  . PFIZER SARS-COV-2 Vaccination 07/23/2019, 08/17/2019  . Pneumococcal Conjugate-13 05/02/2014  . Pneumococcal Polysaccharide-23 05/04/2015  . Tetanus 02/28/2014  . Zoster Recombinat (Shingrix) 11/07/2017, 01/08/2018    TDAP status: Up to date Flu Vaccine status: Up to date Pneumococcal vaccine status: Up to date Covid-19 vaccine status: Completed vaccines  Qualifies for Shingles Vaccine?  Zostavax completed Yes   Shingrix Completed?: Yes  Screening Tests Health Maintenance  Topic Date Due  . INFLUENZA VACCINE  01/16/2020  . TETANUS/TDAP  02/29/2024  . DEXA SCAN  Completed  . COVID-19 Vaccine  Completed  . PNA vac Low Risk Adult  Completed    Health Maintenance  There are no preventive care reminders to display for this patient.  Colorectal cancer screening: No longer required.  Mammogram status: Completed 12/16/18. Repeat every year Bone density: Completed 10/04/14    Additional Screening:  Hepatitis C Screening:   Vision Screening: Recommended annual ophthalmology exams for early detection of glaucoma and other disorders of the eye. Is  the patient up to date with their annual eye exam?  Yes  Who is the provider or what is the name of the office in which the patient attends annual eye exams? Pt states she has one and will follow up this year   Dental Screening: Recommended annual dental exams for proper oral hygiene  Community Resource Referral / Chronic Care Management: CRR required this visit?  No   CCM required this visit?  No      Plan:     I have personally reviewed and noted the following in the patient's chart:   . Medical and social history . Use of alcohol, tobacco or illicit drugs  . Current medications and supplements . Functional ability and status . Nutritional status . Physical activity . Advanced directives . List of other physicians . Hospitalizations, surgeries, and ER visits in previous 12 months . Vitals . Screenings to include cognitive, depression, and falls . Referrals and appointments  In addition, I have reviewed and discussed with patient certain preventive protocols, quality metrics, and best practice recommendations. A written personalized care plan for preventive services as well as general preventive health recommendations were provided to patient.     Willette Brace, LPN   [id number]   Nurse Notes: None  I have reviewed documentation for AWV and Advance Care planning provided by Health Coach, I agree with documentation, I was immediately available for any questions. [name], [place]

## 2019-12-22 DIAGNOSIS — Z1231 Encounter for screening mammogram for malignant neoplasm of breast: Secondary | ICD-10-CM | POA: Diagnosis not present

## 2020-01-12 ENCOUNTER — Ambulatory Visit: Payer: Medicare Other | Admitting: Emergency Medicine

## 2020-01-27 ENCOUNTER — Other Ambulatory Visit: Payer: Self-pay | Admitting: Physician Assistant

## 2020-01-27 DIAGNOSIS — E785 Hyperlipidemia, unspecified: Secondary | ICD-10-CM

## 2020-01-27 DIAGNOSIS — G894 Chronic pain syndrome: Secondary | ICD-10-CM

## 2020-01-27 DIAGNOSIS — F5101 Primary insomnia: Secondary | ICD-10-CM

## 2020-01-27 NOTE — Telephone Encounter (Signed)
LAST APPOINTMENT DATE: 09/22/2019   NEXT APPOINTMENT DATE: Visit date not found    LAST REFILL: 12/12/2019  QTY: 60 tablets

## 2020-02-24 ENCOUNTER — Ambulatory Visit: Payer: Medicare Other | Admitting: Emergency Medicine

## 2020-03-15 ENCOUNTER — Telehealth: Payer: Self-pay | Admitting: Emergency Medicine

## 2020-03-15 ENCOUNTER — Other Ambulatory Visit: Payer: Self-pay

## 2020-03-15 ENCOUNTER — Encounter: Payer: Self-pay | Admitting: Emergency Medicine

## 2020-03-15 ENCOUNTER — Ambulatory Visit (INDEPENDENT_AMBULATORY_CARE_PROVIDER_SITE_OTHER): Payer: Medicare Other | Admitting: Emergency Medicine

## 2020-03-15 DIAGNOSIS — R05 Cough: Secondary | ICD-10-CM

## 2020-03-15 DIAGNOSIS — R053 Chronic cough: Secondary | ICD-10-CM

## 2020-03-15 MED ORDER — LORATADINE 10 MG PO TABS
10.0000 mg | ORAL_TABLET | Freq: Every day | ORAL | 3 refills | Status: DC
Start: 2020-03-15 — End: 2020-12-25

## 2020-03-15 NOTE — Telephone Encounter (Signed)
Yes we will plan to continue the omeprazole, start loratadine and check full PFT. I believe that the patient / family have been notified about this

## 2020-03-15 NOTE — Progress Notes (Signed)
Subjective:    Patient ID: Krystal Craig, female    DOB: Oct 28, 1932, 86 y.o.   MRN: [id number]  HPI 86 year old former smoker (30 pack years) with a history of left lower lobectomy (Dr. Arlyce Dice) 2006 for adenocarcinoma.  Also with diaphragmatic hernia, GERD with Barrett's esophagus, CKD stage III, psoriasis, allergic rhinitis, anxiety/depression, ? dementia.   She reports that she began to have cough about 6 weeks, feels a fullness in her throat, globus sensation. Non-productive. Hasn't lost her voice. Does not recall any precipitating event. Can happen at any time. Doesn't seem to happen at night, after meals. No wheeze. She hasn't tried any meds.  Her granddaughter or son manages her medications.   Chest x-ray performed 09/22/2019 reviewed by me shows blunting of the left hemidiaphragm but otherwise no infiltrates, no acute findings.  ROV 03/15/20 --86 year old-year-old woman former smoker with history of left lower lobectomy in 2006 for adenocarcinoma, also with a diaphragmatic hernia, GERD with Barrett's, CKD, psoriasis, allergic rhinitis, dementia.  I saw her 3 months ago for chronic cough.  We started omeprazole and loratadine to see if she would get benefit.  A chest x-ray done 12/09/2019 reviewed by me, showed some hyperinflation but no other acute findings.  She is not on any bronchodilator therapy. She reports today that she continues to have cough, sometimes dry, sometimes productive of mucous - she believes clear. She has a globus sensation. She does not have a lot of nasal congestion. No overt GERD sx.   Review of Systems As per HPI     Objective:   Physical Exam Vitals:   03/15/20 1539  BP: (!) 162/70  Pulse: 82  Temp: (!) 97.3 F (36.3 C)  TempSrc: Temporal  SpO2: 97%  Weight: 154 lb 12.8 oz (70.2 kg)  Height: 5\' 3"  (1.6 m)   Gen: Pleasant, well-nourished, in no distress,  normal affect, occasional dry cough  ENT: No lesions,  mouth clear,  oropharynx clear, no  postnasal drip  Neck: No JVD, no stridor  Lungs: No use of accessory muscles, no crackles or wheezing on normal respiration, no wheeze on forced expiration  Cardiovascular: RRR, heart sounds normal, no murmur or gallops, no peripheral edema  Musculoskeletal: No deformities, no cyanosis or clubbing  Neuro: alert, awake, poor memory, poor history giver non focal  Skin: Warm, no lesions or rash       Assessment & Plan:  Chronic cough She has no memory of whether she her started loratadine or the omeprazole.  If she did not then I think it still reasonable for [name] to wait and do a trial of both these medications before committing her other testing.  If she was actually taking this regimen then we probably need to evaluate further with pulmonary function testing, a CT scan of her chest given her history of lung cancer, possibly even bronchoscopy going forward depending on the other testing.  Her daughter is here with her today.  They will call me to let me know if she was taking the regimen.  If so then we will order the tests and we will follow them up next time.  Please try to confirm whether you started loratadine 10 mg once daily (generic Claritin) and omeprazole 20 mg daily (generic Prilosec) after last visit.  Call and let us know if you took these We will plan to start (or restart) the medications above following this visit Depending on whether you were taking these medications, we will decide whether to  perform pulmonary function testing and a repeat CT scan of your chest. Follow with Dr Lamonte Sakai in 1 month or next available  Baltazar Apo, MD, PhD 03/15/2020, 4:02 PM [facility] [phone] or if no answer [phone]

## 2020-03-15 NOTE — Assessment & Plan Note (Signed)
She has no memory of whether she her started loratadine or the omeprazole.  If she did not then I think it still reasonable for [name] to wait and do a trial of both these medications before committing her other testing.  If she was actually taking this regimen then we probably need to evaluate further with pulmonary function testing, a CT scan of her chest given her history of lung cancer, possibly even bronchoscopy going forward depending on the other testing.  Her daughter is here with her today.  They will call me to let me know if she was taking the regimen.  If so then we will order the tests and we will follow them up next time.  Please try to confirm whether you started loratadine 10 mg once daily (generic Claritin) and omeprazole 20 mg daily (generic Prilosec) after last visit.  Call and let us know if you took these We will plan to start (or restart) the medications above following this visit Depending on whether you were taking these medications, we will decide whether to perform pulmonary function testing and a repeat CT scan of your chest. Follow with Dr Lamonte Sakai in 1 month or next available

## 2020-03-15 NOTE — Telephone Encounter (Signed)
ATCLVM for pt to schedule PFTs per RB. If pt calls back please schedule first available full PFT. Thank you

## 2020-03-15 NOTE — Patient Instructions (Signed)
Please try to confirm whether you started loratadine 10 mg once daily (generic Claritin) and omeprazole 20 mg daily (generic Prilosec) after last visit.  Call and let us know if you took these We will plan to start (or restart) the medications above following this visit Depending on whether you were taking these medications, we will decide whether to perform pulmonary function testing and a repeat CT scan of your chest. Follow with Dr Lamonte Sakai in 1 month or next available

## 2020-03-15 NOTE — Addendum Note (Signed)
Addended by: Gavin Potters R on: 03/15/2020 04:39 PM   Modules accepted: Orders

## 2020-03-15 NOTE — Telephone Encounter (Signed)
Krystal Craig did take both the generic Prilosec and the generic Claritin however, she only took the Claritin for 30 days but has been on the Prilosec continuously. Do you want to proceed with the pulmonary testing? Her follow up appointment with you is October 21 and you specified in today's appointment that you would like the pulmonary testing done prior to the next appointment.  Dr. Lamonte Sakai,  Please advise.  Thank you.

## 2020-03-17 ENCOUNTER — Telehealth: Payer: Self-pay | Admitting: Emergency Medicine

## 2020-03-17 NOTE — Telephone Encounter (Signed)
We sent rx for loratidine already on 9/29  Called daughter- not on DPR and advised I will need to only speak with the pt  Called the pt at her home and line rings then turns busy  Will try back later

## 2020-03-21 NOTE — Telephone Encounter (Signed)
lmtcb for pt.  

## 2020-03-23 NOTE — Telephone Encounter (Signed)
Lmtcb for pt.  Due to several unsuccessful attempts to reach pt, per triage protocol, the phone note will be closed. Will sign off.

## 2020-04-06 ENCOUNTER — Ambulatory Visit: Payer: Medicare Other | Admitting: Emergency Medicine

## 2020-04-18 ENCOUNTER — Ambulatory Visit: Payer: Medicare Other | Admitting: Emergency Medicine

## 2020-05-03 ENCOUNTER — Other Ambulatory Visit: Payer: Self-pay | Admitting: Physician Assistant

## 2020-05-03 DIAGNOSIS — F5101 Primary insomnia: Secondary | ICD-10-CM

## 2020-05-03 DIAGNOSIS — G894 Chronic pain syndrome: Secondary | ICD-10-CM

## 2020-05-03 DIAGNOSIS — E785 Hyperlipidemia, unspecified: Secondary | ICD-10-CM

## 2020-05-04 ENCOUNTER — Other Ambulatory Visit: Payer: Self-pay | Admitting: *Deleted

## 2020-05-04 DIAGNOSIS — F5101 Primary insomnia: Secondary | ICD-10-CM

## 2020-05-04 MED ORDER — CLONAZEPAM 0.5 MG PO TABS: 0.5000 mg | ORAL_TABLET | Freq: Two times a day (BID) | ORAL | 0 refills | Status: DC | PRN

## 2020-05-04 NOTE — Telephone Encounter (Signed)
Received request from pharmacy pt needs refill on Clonazepam.

## 2020-06-20 ENCOUNTER — Other Ambulatory Visit: Payer: Self-pay | Admitting: Neurology

## 2020-06-20 DIAGNOSIS — F03A0 Unspecified dementia, mild, without behavioral disturbance, psychotic disturbance, mood disturbance, and anxiety: Secondary | ICD-10-CM

## 2020-06-20 DIAGNOSIS — F0390 Unspecified dementia without behavioral disturbance: Secondary | ICD-10-CM

## 2020-07-06 ENCOUNTER — Other Ambulatory Visit: Payer: Self-pay | Admitting: Neurology

## 2020-07-06 DIAGNOSIS — F03A0 Unspecified dementia, mild, without behavioral disturbance, psychotic disturbance, mood disturbance, and anxiety: Secondary | ICD-10-CM

## 2020-07-06 DIAGNOSIS — F0390 Unspecified dementia without behavioral disturbance: Secondary | ICD-10-CM

## 2020-07-07 ENCOUNTER — Other Ambulatory Visit: Payer: Self-pay

## 2020-07-07 NOTE — Telephone Encounter (Signed)
Pls confirm if she is still taking Donepezil. If she is, will need f/u and can send refills until then. Otherwise she can also get this from PCP. Thanks

## 2020-07-07 NOTE — Telephone Encounter (Signed)
Patient is taken, front office in process of scheduling appt. Please send in, now for someone it want let me. Thanks. aricept.

## 2020-08-30 ENCOUNTER — Other Ambulatory Visit: Payer: Self-pay | Admitting: Physician Assistant

## 2020-08-30 DIAGNOSIS — E785 Hyperlipidemia, unspecified: Secondary | ICD-10-CM

## 2020-08-30 DIAGNOSIS — G894 Chronic pain syndrome: Secondary | ICD-10-CM

## 2020-09-04 DIAGNOSIS — H6122 Impacted cerumen, left ear: Secondary | ICD-10-CM | POA: Diagnosis not present

## 2020-09-05 ENCOUNTER — Ambulatory Visit: Payer: Medicare Other | Admitting: Physician Assistant

## 2020-12-06 ENCOUNTER — Other Ambulatory Visit: Payer: Self-pay

## 2020-12-06 ENCOUNTER — Other Ambulatory Visit: Payer: Self-pay | Admitting: Physician Assistant

## 2020-12-06 DIAGNOSIS — E785 Hyperlipidemia, unspecified: Secondary | ICD-10-CM

## 2020-12-25 ENCOUNTER — Other Ambulatory Visit: Payer: Self-pay

## 2020-12-25 ENCOUNTER — Ambulatory Visit (INDEPENDENT_AMBULATORY_CARE_PROVIDER_SITE_OTHER): Payer: Medicare Other

## 2020-12-25 VITALS — BP 132/78 | HR 82 | Temp 98.2°F | Wt 157.2 lb

## 2020-12-25 DIAGNOSIS — Z0000 Encounter for general adult medical examination without abnormal findings: Secondary | ICD-10-CM

## 2020-12-25 NOTE — Progress Notes (Addendum)
Willette Brace, LPN   Subjective:   Krystal Craig is a 87 y.o. female who presents for Medicare Annual (Subsequent) preventive examination.  Review of Systems     Cardiac Risk Factors include: advanced age (>72men, >43 women);dyslipidemia     Objective:    Today's Vitals   12/25/20 1345  BP: 132/78  Pulse: 82  Temp: 98.2 F (36.8 C)  SpO2: 96%  Weight: 157 lb 3.2 oz (71.3 kg)   Body mass index is 27.85 kg/m.  Advanced Directives 12/25/2020 12/13/2019 05/18/2019 01/15/2019 12/08/2018 10/09/2017 10/02/2016  Does Patient Have a Medical Advance Directive? Yes Yes Yes No No Yes Yes  Type of Paramedic of [place];Living will Living will;Healthcare Power of [place];Living will  Does patient want to make changes to medical advance directive? - - - - - - -  Copy of [place] in Chart? Yes - validated most recent copy scanned in chart (See row information) Yes - validated most recent copy scanned in chart (See row information) - - - - No - copy requested  Would patient like information on creating a medical advance directive? - - - No - Patient declined - - -    Current Medications (verified) Outpatient Encounter Medications as of 12/25/2020  Medication Sig   acetaminophen (TYLENOL) 650 MG CR tablet Take 650 mg by mouth every 8 (eight) hours as needed for pain.   aspirin 81 MG tablet Take 81 mg by mouth daily.   Calcium Carbonate-Vitamin D (CALCIUM-VITAMIN D) 500-200 MG-UNIT tablet Take 1 tablet by mouth daily.   esomeprazole (NEXIUM) 20 MG capsule Take 1 capsule (20 mg total) by mouth daily at 12 noon.   Multiple Vitamins-Minerals (MULTIVITAMIN WITH MINERALS) tablet Take 1 tablet by mouth daily.   Probiotic Product (PROBIOTIC PO) Take 1 tablet by mouth daily.    simvastatin (ZOCOR) 40 MG tablet TAKE 1 TABLET BY MOUTH AT BEDTIME   [DISCONTINUED] clonazePAM (KLONOPIN) 0.5 MG  tablet Take 1 tablet (0.5 mg total) by mouth 2 (two) times daily as needed. for anxiety   [DISCONTINUED] escitalopram (LEXAPRO) 10 MG tablet Take 1 tablet by mouth once daily   [DISCONTINUED] loratadine (CLARITIN) 10 MG tablet Take 1 tablet (10 mg total) by mouth daily.   [DISCONTINUED] omeprazole (PRILOSEC) 20 MG capsule Take 1 capsule (20 mg total) by mouth daily.   [DISCONTINUED] ondansetron (ZOFRAN) 4 MG tablet TAKE 1 TABLET BY MOUTH THREE TIMES DAILY   [DISCONTINUED] zolpidem (AMBIEN) 10 MG tablet Take 1 tablet (10 mg total) by mouth at bedtime as needed. for sleep (Patient taking differently: Take 5 mg by mouth at bedtime as needed. for sleep)   No facility-administered encounter medications on file as of 12/25/2020.    Allergies (verified) Ciprocin-fluocin-procin [fluocinolone], Codeine, and Wellbutrin [bupropion]   History: Past Medical History:  Diagnosis Date   Allergic rhinitis    Anemia    Aortic atherosclerosis 05/26/2018   Barrett's esophagus with dysplasia 04/07/2019   CKD (chronic kidney disease), stage III ([place]) 3/66/4403   Complication of anesthesia    Degenerative arthritis of lumbar spine 05/26/2018   Evaluation by Dr. Ernestina Patches in 9/19. A/P: Chronic history of low back pain and radicular pain status post lumbar discectomy and laminectomy by Dr. Melina Schools in 2016.  No recent MRI imaging but no real red flag complaints at this point other than she is getting pain in the low back  that is problematic over time it just has her at this point where she is limited in certain things she can do in its    Diaphragmatic hernia 06/26/2007   Diverticulosis    GERD (gastroesophageal reflux disease) 06/26/2007   Hiatal hernia    History of bronchitis    History of cancer of lung 2006   2006 20% of left lung resected. Former smoker. Dr. Lenna Gilford was doing CXR yearly. 09/2017 IMPRESSION: No interval change.  No acute cardiopulmonary findings. Postsurgical change LEFT lung base.    Hyperlipidemia 09/25/2011   IBS (irritable bowel syndrome)    Impacted cerumen of both ears    Insomnia    Insulin resistance 05/31/2018   Irritable bowel syndrome 06/26/2007   Major depression disorder in remission    Major neurocognitive disorder ([place]) 07/30/2019   Osteoarthritis    Osteopenia    PONV (postoperative nausea and vomiting)    Pre-diabetes    Primary osteoarthritis of both first carpometacarpal joints 12/28/2015   Psoriasis    Pure hypercholesterolemia    Situational anxiety 11/06/2016   Thoracic lymphadenopathy 09/25/2011   Vitamin D deficiency    Past Surgical History:  Procedure Laterality Date   ABDOMINAL HYSTERECTOMY  1969   APPENDECTOMY     bilateral shouler operations for impingement syndromes Bilateral 2001   BUNIONECTOMY Left    CATARACT EXTRACTION, BILATERAL Bilateral 2012   CHOLECYSTECTOMY  1976   COLONOSCOPY     LAPAROSCOPIC LYSIS INTESTINAL ADHESIONS     left lower lobectomy  11/2004   via VATS and mini-thoractomy for 1.5cm adenoca of lung by Dr. Samantha Crimes LAMINECTOMY/DECOMPRESSION MICRODISCECTOMY Left 01/05/2015   Procedure: L3-4 DECOMPRESSION MICRODISCECTOMY ON LEFT;  Surgeon: Melina Schools, MD;  Location: [facility];  Service: Orthopedics;  Laterality: Left;  left Lumbar 3-4   POLYPECTOMY     turmor ear Right 09/2013   urologic sugery with cystocele/rectocele repairs and sling  03/2007   Dr. Terance Hart   Family History  Problem Relation Age of Onset   Breast cancer Sister 80   Memory loss Sister    Dementia Mother    Heart attack Mother    CVA Father    Stroke Father    Colon cancer Neg Hx    Esophageal cancer Neg Hx    Pancreatic cancer Neg Hx    Rectal cancer Neg Hx    Stomach cancer Neg Hx    Colon polyps Neg Hx    Social History   Socioeconomic History   Marital status: Married    Spouse name: Not on file   Number of children: Not on file   Years of education: 1   Highest education level: Some college, no degree  Occupational  History   Occupation: Retired  Tobacco Use   Smoking status: Former    Packs/day: 1.00    Years: 30.00    Pack years: 30.00    Types: Cigarettes    Quit date: 06/17/1984    Years since quitting: 36.5   Smokeless tobacco: Never  Vaping Use   Vaping Use: Never used  Substance and Sexual Activity   Alcohol use: No    Alcohol/week: 0.0 standard drinks   Drug use: No   Sexual activity: Not Currently    Birth control/protection: Post-menopausal    Comment: 1st intercourse 80 yo-2 partners  Other Topics Concern   Not on file  Social History Narrative   Married (2nd marriage; 1st husband died at age 64) married 50 29  years in 05/2014. 2 sons from first marriage, 3 from 2nd (step). 15 grandchildren. 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 Hobbies: reading, golf, family time.   [name] is POA   Right handed   Lives in single story home with husband   Social Determinants of Health   Financial Resource Strain: Low Risk    Difficulty of Paying Living Expenses: Not hard at all  Food Insecurity: No Food Insecurity   Worried About Charity fundraiser in the Last Year: Never true   Arboriculturist in the Last Year: Never true  Transportation Needs: No Transportation Needs   Lack of Transportation (Medical): No   Lack of Transportation (Non-Medical): No  Physical Activity: Inactive   Days of Exercise per Week: 0 days   Minutes of Exercise per Session: 0 min  Stress: No Stress Concern Present   Feeling of Stress : Not at all  Social Connections: Moderately Isolated   Frequency of Communication with Friends and Family: More than three times a week   Frequency of Social Gatherings with Friends and Family: Twice a week   Attends Religious Services: Never   Marine scientist or Organizations: No   Attends Music therapist: Never   Marital Status: Married    Tobacco Counseling Counseling given: Not Answered   Clinical Intake:  Pre-visit preparation completed: Yes  Pain :  No/denies pain     BMI - recorded: 27.85 Nutritional Status: BMI 25 -29 Overweight Nutritional Risks: None Diabetes: No     Diabetic?No  Interpreter Needed?: No  Information entered by :: Charlott Rakes, LPN   Activities of Daily Living In your present state of health, do you have any difficulty performing the following activities: 12/25/2020  Hearing? Y  Comment mild loss  Vision? N  Difficulty concentrating or making decisions? Y  Walking or climbing stairs? N  Dressing or bathing? N  Doing errands, shopping? N  Preparing Food and eating ? N  Using the Toilet? N  In the past six months, have you accidently leaked urine? N  Do you have problems with loss of bowel control? N  Managing your Medications? N  Managing your Finances? N  Housekeeping or managing your Housekeeping? N  Some recent data might be hidden    Patient Care Team: [name], [place] as PCP - General (Physician Assistant) Darleen Crocker, MD as Consulting Physician (Ophthalmology) Noralee Space, MD as Consulting Physician (Pulmonary Disease) Magnus Sinning, MD as Consulting Physician (Physical Medicine and Rehabilitation) Cameron Sprang, MD as Consulting Physician (Neurology)  Indicate any recent Medical Services you may have received from other than [facility] providers in the past year (date may be approximate).     Assessment:   This is a routine wellness examination for [place].  Hearing/Vision screen Hearing Screening - Comments:: Pt stated mild loss  Vision Screening - Comments:: Pt will follow up with insurance to see provider   Dietary issues and exercise activities discussed: Current Exercise Habits: The patient does not participate in regular exercise at present   Goals Addressed             This Visit's Progress    Patient Stated       Staying healthy        Depression Screen PHQ 2/9 Scores 12/25/2020 12/13/2019 03/22/2019 06/29/2018 04/27/2018 12/22/2017 10/09/2017  PHQ - 2  Score 0 0 4 0 0 2 0  PHQ- 9 Score - - 4 0 0 2 -  Fall Risk Fall Risk  12/25/2020 12/13/2019 05/18/2019 12/02/2018 10/09/2017  Falls in the past year? 0 0 0 0 No  Number falls in past yr: 0 0 - - -  Injury with Fall? 0 0 - - -  Risk for fall due to : Impaired vision No Fall Risks - - -  Follow up Falls prevention discussed Falls prevention discussed - - -    FALL RISK PREVENTION PERTAINING TO THE HOME:  Any stairs in or around the home? Yes  If so, are there any without handrails? No  Home free of loose throw rugs in walkways, pet beds, electrical cords, etc? Yes  Adequate lighting in your home to reduce risk of falls? Yes   ASSISTIVE DEVICES UTILIZED TO PREVENT FALLS:  Life alert? No  Use of a cane, walker or w/c? No  Grab bars in the bathroom? Yes  Shower chair or bench in shower? No  Elevated toilet seat or a handicapped toilet? No   TIMED UP AND GO:  Was the test performed? Yes  10 sec gait is fast and steady    Cognitive Function:     6CIT Screen 12/25/2020 12/13/2019  What Year? 0 points 0 points  What month? 0 points 0 points  What time? 0 points 0 points  Count back from 20 0 points 0 points  Months in reverse 0 points 0 points  Repeat phrase 0 points 4 points  Total Score 0 4    Immunizations Immunization History  Administered Date(s) Administered   Influenza Split 03/18/2011, 05/04/2012, 04/17/2017   Influenza Whole 04/02/2006, 03/24/2009, 05/01/2010   Influenza, High Dose Seasonal PF 03/25/2016, 03/20/2017, 03/18/2019   Influenza,inj,Quad PF,6+ Mos 02/28/2014, 04/05/2015   Influenza-Unspecified 03/17/2013, 02/19/2018   PFIZER(Purple Top)SARS-COV-2 Vaccination 07/23/2019, 08/17/2019   Pneumococcal Conjugate-13 05/02/2014   Pneumococcal Polysaccharide-23 05/04/2015   Tetanus 02/28/2014   Zoster Recombinat (Shingrix) 11/07/2017, 01/08/2018    TDAP status: Up to date  Flu Vaccine status: Up to date  Pneumococcal vaccine status: Up to date  Covid-19  vaccine status: Completed vaccines  Qualifies for Shingles Vaccine? Yes   Zostavax completed Yes   Shingrix Completed?: Yes  Screening Tests Health Maintenance  Topic Date Due   COVID-19 Vaccine (3 - Pfizer risk series) 09/14/2019   INFLUENZA VACCINE  01/15/2021   TETANUS/TDAP  02/29/2024   DEXA SCAN  Completed   PNA vac Low Risk Adult  Completed   Zoster Vaccines- Shingrix  Completed   HPV VACCINES  Aged Out    Health Maintenance  Health Maintenance Due  Topic Date Due   COVID-19 Vaccine (3 - Pfizer risk series) 09/14/2019    Colorectal cancer screening: No longer required.   Mammogram status: Completed 12/22/19. Repeat every year  Bone Density status: Completed 10/04/14. Results reflect: Bone density results: OSTEOPENIA. Repeat every 2 years.   Additional Screening:   Vision Screening: Recommended annual ophthalmology exams for early detection of glaucoma and other disorders of the eye. Is the patient up to date with their annual eye exam?  No  Who is the provider or what is the name of the office in which the patient attends annual eye exams? Follow up with insurance for eye exams  If pt is not established with a provider, would they like to be referred to a provider to establish care? Yes .   Dental Screening: Recommended annual dental exams for proper oral hygiene  Community Resource Referral / Chronic Care Management: CRR required this visit?  No  CCM required this visit?  No      Plan:     I have personally reviewed and noted the following in the patient's chart:   Medical and social history Use of alcohol, tobacco or illicit drugs  Current medications and supplements including opioid prescriptions.  Functional ability and status Nutritional status Physical activity Advanced directives List of other physicians Hospitalizations, surgeries, and ER visits in previous 12 months Vitals Screenings to include cognitive, depression, and falls Referrals  and appointments  In addition, I have reviewed and discussed with patient certain preventive protocols, quality metrics, and best practice recommendations. A written personalized care plan for preventive services as well as general preventive health recommendations were provided to patient.     Willette Brace, LPN   2/68/3419   Nurse Notes: None

## 2020-12-25 NOTE — Patient Instructions (Signed)
Krystal Craig , Thank you for taking time to come for your Medicare Wellness Visit. I appreciate your ongoing commitment to your health goals. Please review the following plan we discussed and let me know if I can assist you in the future.   Screening recommendations/referrals: Colonoscopy: No longer required  Mammogram: Done 12/22/19 repeat every year  Bone Density: Done 10/04/14 repeat every 2 years Recommended yearly ophthalmology/optometry visit for glaucoma screening and checkup Recommended yearly dental visit for hygiene and checkup  Vaccinations: Influenza vaccine: Due 01/15/21 Pneumococcal vaccine: Completed  Tdap vaccine: Done 04/17/14 repeat in 10 years 04/17/24 Shingles vaccine: Competed 5/24 & 01/08/18   Covid-19:Completed 2/5 & 08/17/19  Advanced directives: Copies in chart  Conditions/risks identified: stay healthy   Next appointment: Follow up in one year for your annual wellness visit    Preventive Care 45 Years and Older, Female Preventive care refers to lifestyle choices and visits with your health care provider that can promote health and wellness. What does preventive care include? A yearly physical exam. This is also called an annual well check. Dental exams once or twice a year. Routine eye exams. Ask your health care provider how often you should have your eyes checked. Personal lifestyle choices, including: Daily care of your teeth and gums. Regular physical activity. Eating a healthy diet. Avoiding tobacco and drug use. Limiting alcohol use. Practicing safe sex. Taking low-dose aspirin every day. Taking vitamin and mineral supplements as recommended by your health care provider. What happens during an annual well check? The services and screenings done by your health care provider during your annual well check will depend on your age, overall health, lifestyle risk factors, and family history of disease. Counseling  Your health care provider may ask you questions  about your: Alcohol use. Tobacco use. Drug use. Emotional well-being. Home and relationship well-being. Sexual activity. Eating habits. History of falls. Memory and ability to understand (cognition). Work and work Statistician. Reproductive health. Screening  You may have the following tests or measurements: Height, weight, and BMI. Blood pressure. Lipid and cholesterol levels. These may be checked every 5 years, or more frequently if you are over 6 years old. Skin check. Lung cancer screening. You may have this screening every year starting at age 64 if you have a 30-pack-year history of smoking and currently smoke or have quit within the past 15 years. Fecal occult blood test (FOBT) of the stool. You may have this test every year starting at age 87. Flexible sigmoidoscopy or colonoscopy. You may have a sigmoidoscopy every 5 years or a colonoscopy every 10 years starting at age 45. Hepatitis C blood test. Hepatitis B blood test. Sexually transmitted disease (STD) testing. Diabetes screening. This is done by checking your blood sugar (glucose) after you have not eaten for a while (fasting). You may have this done every 1-3 years. Bone density scan. This is done to screen for osteoporosis. You may have this done starting at age 67. Mammogram. This may be done every 1-2 years. Talk to your health care provider about how often you should have regular mammograms. Talk with your health care provider about your test results, treatment options, and if necessary, the need for more tests. Vaccines  Your health care provider may recommend certain vaccines, such as: Influenza vaccine. This is recommended every year. Tetanus, diphtheria, and acellular pertussis (Tdap, Td) vaccine. You may need a Td booster every 10 years. Zoster vaccine. You may need this after age 65. Pneumococcal 13-valent conjugate (PCV13)  vaccine. One dose is recommended after age 31. Pneumococcal polysaccharide (PPSV23)  vaccine. One dose is recommended after age 71. Talk to your health care provider about which screenings and vaccines you need and how often you need them. This information is not intended to replace advice given to you by your health care provider. Make sure you discuss any questions you have with your health care provider. Document Released: 06/30/2015 Document Revised: 02/21/2016 Document Reviewed: 04/04/2015 Elsevier Interactive Patient Education  2017 [place] Prevention in the Home Falls can cause injuries. They can happen to people of all ages. There are many things you can do to make your home safe and to help prevent falls. What can I do on the outside of my home? Regularly fix the edges of walkways and driveways and fix any cracks. Remove anything that might make you trip as you walk through a door, such as a raised step or threshold. Trim any bushes or trees on the path to your home. Use bright outdoor lighting. Clear any walking paths of anything that might make someone trip, such as rocks or tools. Regularly check to see if handrails are loose or broken. Make sure that both sides of any steps have handrails. Any raised decks and porches should have guardrails on the edges. Have any leaves, snow, or ice cleared regularly. Use sand or salt on walking paths during winter. Clean up any spills in your garage right away. This includes oil or grease spills. What can I do in the bathroom? Use night lights. Install grab bars by the toilet and in the tub and shower. Do not use towel bars as grab bars. Use non-skid mats or decals in the tub or shower. If you need to sit down in the shower, use a plastic, non-slip stool. Keep the floor dry. Clean up any water that spills on the floor as soon as it happens. Remove soap buildup in the tub or shower regularly. Attach bath mats securely with double-sided non-slip rug tape. Do not have throw rugs and other things on the floor that  can make you trip. What can I do in the bedroom? Use night lights. Make sure that you have a light by your bed that is easy to reach. Do not use any sheets or blankets that are too big for your bed. They should not hang down onto the floor. Have a firm chair that has side arms. You can use this for support while you get dressed. Do not have throw rugs and other things on the floor that can make you trip. What can I do in the kitchen? Clean up any spills right away. Avoid walking on wet floors. Keep items that you use a lot in easy-to-reach places. If you need to reach something above you, use a strong step stool that has a grab bar. Keep electrical cords out of the way. Do not use floor polish or wax that makes floors slippery. If you must use wax, use non-skid floor wax. Do not have throw rugs and other things on the floor that can make you trip. What can I do with my stairs? Do not leave any items on the stairs. Make sure that there are handrails on both sides of the stairs and use them. Fix handrails that are broken or loose. Make sure that handrails are as long as the stairways. Check any carpeting to make sure that it is firmly attached to the stairs. Fix any carpet that is loose  or worn. Avoid having throw rugs at the top or bottom of the stairs. If you do have throw rugs, attach them to the floor with carpet tape. Make sure that you have a light switch at the top of the stairs and the bottom of the stairs. If you do not have them, ask someone to add them for you. What else can I do to help prevent falls? Wear shoes that: Do not have high heels. Have rubber bottoms. Are comfortable and fit you well. Are closed at the toe. Do not wear sandals. If you use a stepladder: Make sure that it is fully opened. Do not climb a closed stepladder. Make sure that both sides of the stepladder are locked into place. Ask someone to hold it for you, if possible. Clearly mark and make sure that you  can see: Any grab bars or handrails. First and last steps. Where the edge of each step is. Use tools that help you move around (mobility aids) if they are needed. These include: Canes. Walkers. Scooters. Crutches. Turn on the lights when you go into a dark area. Replace any light bulbs as soon as they burn out. Set up your furniture so you have a clear path. Avoid moving your furniture around. If any of your floors are uneven, fix them. If there are any pets around you, be aware of where they are. Review your medicines with your doctor. Some medicines can make you feel dizzy. This can increase your chance of falling. Ask your doctor what other things that you can do to help prevent falls. This information is not intended to replace advice given to you by your health care provider. Make sure you discuss any questions you have with your health care provider. Document Released: 03/30/2009 Document Revised: 11/09/2015 Document Reviewed: 07/08/2014 Elsevier Interactive Patient Education  2017 [employer].

## 2020-12-29 DIAGNOSIS — Z803 Family history of malignant neoplasm of breast: Secondary | ICD-10-CM | POA: Diagnosis not present

## 2020-12-29 DIAGNOSIS — Z1231 Encounter for screening mammogram for malignant neoplasm of breast: Secondary | ICD-10-CM | POA: Diagnosis not present

## 2020-12-29 LAB — HM MAMMOGRAPHY

## 2021-01-03 ENCOUNTER — Encounter: Payer: Self-pay | Admitting: Physician Assistant

## 2021-02-05 ENCOUNTER — Other Ambulatory Visit: Payer: Self-pay | Admitting: Family Medicine

## 2021-02-05 DIAGNOSIS — E785 Hyperlipidemia, unspecified: Secondary | ICD-10-CM

## 2021-05-08 ENCOUNTER — Other Ambulatory Visit: Payer: Self-pay | Admitting: *Deleted

## 2021-05-08 DIAGNOSIS — E785 Hyperlipidemia, unspecified: Secondary | ICD-10-CM

## 2021-05-15 ENCOUNTER — Other Ambulatory Visit: Payer: Self-pay | Admitting: *Deleted

## 2021-05-15 DIAGNOSIS — E785 Hyperlipidemia, unspecified: Secondary | ICD-10-CM

## 2021-06-19 ENCOUNTER — Other Ambulatory Visit: Payer: Self-pay | Admitting: Family Medicine

## 2021-06-19 DIAGNOSIS — E785 Hyperlipidemia, unspecified: Secondary | ICD-10-CM

## 2021-06-21 ENCOUNTER — Ambulatory Visit: Payer: Medicare Other | Admitting: Obstetrics & Gynecology

## 2021-06-21 ENCOUNTER — Other Ambulatory Visit: Payer: Self-pay

## 2021-06-21 ENCOUNTER — Encounter: Payer: Self-pay | Admitting: Obstetrics & Gynecology

## 2021-06-21 ENCOUNTER — Telehealth: Payer: Self-pay | Admitting: *Deleted

## 2021-06-21 VITALS — BP 140/80 | HR 95 | Temp 99.5°F | Resp 14

## 2021-06-21 DIAGNOSIS — N904 Leukoplakia of vulva: Secondary | ICD-10-CM | POA: Diagnosis not present

## 2021-06-21 DIAGNOSIS — N9089 Other specified noninflammatory disorders of vulva and perineum: Secondary | ICD-10-CM | POA: Diagnosis not present

## 2021-06-21 DIAGNOSIS — N898 Other specified noninflammatory disorders of vagina: Secondary | ICD-10-CM

## 2021-06-21 DIAGNOSIS — R350 Frequency of micturition: Secondary | ICD-10-CM

## 2021-06-21 LAB — WET PREP FOR TRICH, YEAST, CLUE

## 2021-06-21 MED ORDER — NITROFURANTOIN MONOHYD MACRO 100 MG PO CAPS: 100.0000 mg | ORAL_CAPSULE | Freq: Two times a day (BID) | ORAL | 0 refills | Status: AC

## 2021-06-21 MED ORDER — CLOBETASOL PROPIONATE 0.05 % EX OINT: 1.0000 "application " | TOPICAL_OINTMENT | Freq: Two times a day (BID) | CUTANEOUS | 4 refills | Status: AC

## 2021-06-21 NOTE — Progress Notes (Signed)
° ° °  Krystal Craig [birth year] [id number]        88 y.o.  G2P2   RP: Vulvar irritation x many weeks  HPI: Vulvar irritation with burning and itching x many weeks.  C/O Urinary frequency.  No blood seen in urine.  No fever.   OB History  Gravida Para Term Preterm AB Living  2 2       2   SAB IAB Ectopic Multiple Live Births               # Outcome Date GA Lbr Len/2nd Weight Sex Delivery Anes PTL Lv  2 Para           1 Para             Past medical history,surgical history, problem list, medications, allergies, family history and social history were all reviewed and documented in the EPIC chart.   Directed ROS with pertinent positives and negatives documented in the history of present illness/assessment and plan.  Exam:  Vitals:   06/21/21 1346  BP: 140/80  Pulse: 95  Resp: 14  Temp: 99.5 F (37.5 C)  TempSrc: Oral   General appearance:  Normal  CVAT Neg bilaterally  Abdomen: Normal  Gynecologic exam: Vulva with white atrophy and erythema.  Wet prep done in the vagina.  Wet prep Neg U/A: Cloudy yellow, Nit Neg, Protein 3+, RBC 0-2, WBC 10-20, Bacteria moderate.  Pending U. Culture.   Assessment/Plan:  88 y.o. G2P2L2  1. Vulvar irritation Wet prep Negative.  Reassured.  2. Lichen sclerosus et atrophicus of the vulva Lichen sclerosus of the vulva probably causing the irritative symptoms.  Diagnosis and treatment reviewed with patient.  Clobetasol ointment 0.05% Twice a day x 2 weeks, then once a day x 1 week, then twice a week long term.  Prescription sent to pharmacy  3. Urinary frequency Abnormal U/A.  Decision to start on MacroBID Twice a day x 7 days.  Usage reviewed.  Pending Urine Culture. - Urinalysis,Complete w/RFL Culture  4. Vaginal discharge Wet prep Negative.  Reassured. - WET PREP FOR [place], YEAST, CLUE  Other orders - nitrofurantoin, macrocrystal-monohydrate, (MACROBID) 100 MG capsule; Take 1 capsule (100 mg total) by mouth 2 (two) times  daily for 7 days. - clobetasol ointment (TEMOVATE) 0.05 %; Apply 1 application topically 2 (two) times daily for 14 days. Thin application on the affected vulva.  Twice a day x 2 weeks, then daily x 1 week, then wean to twice weekly for long term use for Lichen Sclerosus of the vulva.   Princess Bruins MD, 3:23 PM 06/21/2021

## 2021-06-21 NOTE — Telephone Encounter (Signed)
Patient was seen today for urinary frequency Macrobid was sent to pharmacy, patient reports a Rx for ointment was to be sent as well? Please advise

## 2021-06-22 NOTE — Telephone Encounter (Signed)
I called # in chart and the voicemail said "Gwyndolyn Saxon" I did not leave a message. However Dr.Lavoie sent Rx for clobetasol ointment yesterday.

## 2021-06-23 LAB — URINE CULTURE
MICRO NUMBER:: 12831795
SPECIMEN QUALITY:: ADEQUATE

## 2021-06-23 LAB — URINALYSIS, COMPLETE W/RFL CULTURE
Bilirubin Urine: NEGATIVE
Glucose, UA: NEGATIVE
Hyaline Cast: NONE SEEN /LPF
Ketones, ur: NEGATIVE
Nitrites, Initial: NEGATIVE
Specific Gravity, Urine: 1.013 (ref 1.001–1.035)
pH: 5.5 (ref 5.0–8.0)

## 2021-06-23 LAB — CULTURE INDICATED

## 2021-06-27 ENCOUNTER — Encounter: Payer: Self-pay | Admitting: Obstetrics & Gynecology

## 2021-07-09 ENCOUNTER — Other Ambulatory Visit: Payer: Self-pay | Admitting: Physician Assistant

## 2021-07-09 DIAGNOSIS — E785 Hyperlipidemia, unspecified: Secondary | ICD-10-CM

## 2021-07-13 ENCOUNTER — Other Ambulatory Visit: Payer: Self-pay | Admitting: *Deleted

## 2021-07-13 DIAGNOSIS — E785 Hyperlipidemia, unspecified: Secondary | ICD-10-CM

## 2021-07-13 MED ORDER — SIMVASTATIN 40 MG PO TABS: 40.0000 mg | ORAL_TABLET | Freq: Every day | ORAL | 0 refills | Status: DC

## 2021-09-07 ENCOUNTER — Other Ambulatory Visit: Payer: Self-pay | Admitting: Physician Assistant

## 2021-09-07 DIAGNOSIS — E785 Hyperlipidemia, unspecified: Secondary | ICD-10-CM

## 2021-09-19 ENCOUNTER — Ambulatory Visit: Payer: Medicare Other | Admitting: Obstetrics & Gynecology

## 2021-09-28 ENCOUNTER — Emergency Department (HOSPITAL_COMMUNITY)
Admission: EM | Admit: 2021-09-28 | Discharge: 2021-09-29 | Disposition: A | Discharge status: Home / Self Care | Payer: Medicare Other | Attending: Emergency Medicine | Admitting: Emergency Medicine

## 2021-09-28 ENCOUNTER — Encounter (HOSPITAL_COMMUNITY): Payer: Self-pay | Admitting: Emergency Medicine

## 2021-09-28 ENCOUNTER — Emergency Department (HOSPITAL_COMMUNITY): Payer: Medicare Other

## 2021-09-28 ENCOUNTER — Ambulatory Visit: Payer: Medicare Other | Admitting: Obstetrics & Gynecology

## 2021-09-28 ENCOUNTER — Encounter: Payer: Self-pay | Admitting: Obstetrics & Gynecology

## 2021-09-28 ENCOUNTER — Encounter (HOSPITAL_COMMUNITY): Payer: Self-pay

## 2021-09-28 VITALS — BP 122/80

## 2021-09-28 DIAGNOSIS — R519 Headache, unspecified: Secondary | ICD-10-CM | POA: Insufficient documentation

## 2021-09-28 DIAGNOSIS — K760 Fatty (change of) liver, not elsewhere classified: Secondary | ICD-10-CM | POA: Diagnosis not present

## 2021-09-28 DIAGNOSIS — I10 Essential (primary) hypertension: Secondary | ICD-10-CM | POA: Diagnosis not present

## 2021-09-28 DIAGNOSIS — Z87891 Personal history of nicotine dependence: Secondary | ICD-10-CM | POA: Insufficient documentation

## 2021-09-28 DIAGNOSIS — I6523 Occlusion and stenosis of bilateral carotid arteries: Secondary | ICD-10-CM | POA: Diagnosis not present

## 2021-09-28 DIAGNOSIS — I672 Cerebral atherosclerosis: Secondary | ICD-10-CM | POA: Diagnosis not present

## 2021-09-28 DIAGNOSIS — R030 Elevated blood-pressure reading, without diagnosis of hypertension: Secondary | ICD-10-CM | POA: Insufficient documentation

## 2021-09-28 DIAGNOSIS — R0789 Other chest pain: Secondary | ICD-10-CM | POA: Insufficient documentation

## 2021-09-28 DIAGNOSIS — N183 Chronic kidney disease, stage 3 unspecified: Secondary | ICD-10-CM | POA: Diagnosis not present

## 2021-09-28 DIAGNOSIS — N904 Leukoplakia of vulva: Secondary | ICD-10-CM

## 2021-09-28 DIAGNOSIS — J341 Cyst and mucocele of nose and nasal sinus: Secondary | ICD-10-CM | POA: Diagnosis not present

## 2021-09-28 DIAGNOSIS — R079 Chest pain, unspecified: Secondary | ICD-10-CM | POA: Diagnosis not present

## 2021-09-28 DIAGNOSIS — F0390 Unspecified dementia without behavioral disturbance: Secondary | ICD-10-CM | POA: Insufficient documentation

## 2021-09-28 DIAGNOSIS — Z85118 Personal history of other malignant neoplasm of bronchus and lung: Secondary | ICD-10-CM | POA: Diagnosis not present

## 2021-09-28 DIAGNOSIS — R0602 Shortness of breath: Secondary | ICD-10-CM | POA: Diagnosis not present

## 2021-09-28 DIAGNOSIS — R590 Localized enlarged lymph nodes: Secondary | ICD-10-CM | POA: Diagnosis not present

## 2021-09-28 DIAGNOSIS — R109 Unspecified abdominal pain: Secondary | ICD-10-CM | POA: Diagnosis not present

## 2021-09-28 DIAGNOSIS — M47812 Spondylosis without myelopathy or radiculopathy, cervical region: Secondary | ICD-10-CM | POA: Diagnosis not present

## 2021-09-28 LAB — CBC
HCT: 40.3 % (ref 36.0–46.0)
Hemoglobin: 13.1 g/dL (ref 12.0–15.0)
MCH: 28.5 pg (ref 26.0–34.0)
MCHC: 32.5 g/dL (ref 30.0–36.0)
MCV: 87.8 fL (ref 80.0–100.0)
Platelets: 318 10*3/uL (ref 150–400)
RBC: 4.59 MIL/uL (ref 3.87–5.11)
RDW: 14.4 % (ref 11.5–15.5)
WBC: 9.4 10*3/uL (ref 4.0–10.5)
nRBC: 0 % (ref 0.0–0.2)

## 2021-09-28 LAB — BASIC METABOLIC PANEL
Anion gap: 8 (ref 5–15)
BUN: 13 mg/dL (ref 8–23)
CO2: 24 mmol/L (ref 22–32)
Calcium: 9.3 mg/dL (ref 8.9–10.3)
Chloride: 104 mmol/L (ref 98–111)
Creatinine, Ser: 1.09 mg/dL — ABNORMAL HIGH (ref 0.44–1.00)
GFR, Estimated: 49 mL/min — ABNORMAL LOW (ref >60)
Glucose, Bld: 102 mg/dL — ABNORMAL HIGH (ref 70–99)
Potassium: 4 mmol/L (ref 3.5–5.1)
Sodium: 136 mmol/L (ref 135–145)

## 2021-09-28 LAB — HEPATIC FUNCTION PANEL
ALT: 27 U/L (ref 0–44)
AST: 27 U/L (ref 15–41)
Albumin: 4 g/dL (ref 3.5–5.0)
Alkaline Phosphatase: 78 U/L (ref 38–126)
Bilirubin, Direct: 0.1 mg/dL (ref 0.0–0.2)
Indirect Bilirubin: 1.2 mg/dL — ABNORMAL HIGH (ref 0.3–0.9)
Total Bilirubin: 1.3 mg/dL — ABNORMAL HIGH (ref 0.3–1.2)
Total Protein: 7.3 g/dL (ref 6.5–8.1)

## 2021-09-28 LAB — LIPASE, BLOOD: Lipase: 35 U/L (ref 11–51)

## 2021-09-28 LAB — TROPONIN I (HIGH SENSITIVITY)
Troponin I (High Sensitivity): 6 ng/L (ref <18)
Troponin I (High Sensitivity): 7 ng/L (ref <18)

## 2021-09-28 MED ORDER — LIDOCAINE VISCOUS HCL 2 % MT SOLN
15.0000 mL | Freq: Once | OROMUCOSAL | Status: DC
  Filled 2021-09-28: qty 15

## 2021-09-28 MED ORDER — FAMOTIDINE IN NACL 20-0.9 MG/50ML-% IV SOLN
20.0000 mg | Freq: Once | INTRAVENOUS | Status: DC
  Filled 2021-09-28: qty 50

## 2021-09-28 MED ORDER — SODIUM CHLORIDE 0.9 % IV BOLUS: 500.0000 mL | Freq: Once | INTRAVENOUS | Status: DC

## 2021-09-28 MED ORDER — MAGNESIUM SULFATE 2 GM/50ML IV SOLN
2.0000 g | Freq: Once | INTRAVENOUS | Status: AC
  Administered 2021-09-28: 2 g via INTRAVENOUS
  Filled 2021-09-28: qty 50

## 2021-09-28 MED ORDER — ACETAMINOPHEN 325 MG PO TABS
650.0000 mg | ORAL_TABLET | Freq: Once | ORAL | Status: AC
  Administered 2021-09-28: 650 mg via ORAL
  Filled 2021-09-28: qty 2

## 2021-09-28 MED ORDER — DIPHENHYDRAMINE HCL 50 MG/ML IJ SOLN
25.0000 mg | Freq: Once | INTRAMUSCULAR | Status: AC
  Administered 2021-09-28: 25 mg via INTRAVENOUS
  Filled 2021-09-28: qty 1

## 2021-09-28 MED ORDER — AMLODIPINE BESYLATE 2.5 MG PO TABS: 2.5000 mg | ORAL_TABLET | Freq: Every day | ORAL | 0 refills | Status: DC

## 2021-09-28 MED ORDER — FENTANYL CITRATE PF 50 MCG/ML IJ SOSY
50.0000 ug | PREFILLED_SYRINGE | Freq: Once | INTRAMUSCULAR | Status: AC
  Administered 2021-09-28: 50 ug via INTRAVENOUS
  Filled 2021-09-28: qty 1

## 2021-09-28 MED ORDER — IOHEXOL 350 MG/ML SOLN
150.0000 mL | Freq: Once | INTRAVENOUS | Status: AC | PRN
  Administered 2021-09-28: 150 mL via INTRAVENOUS

## 2021-09-28 MED ORDER — CLOBETASOL PROPIONATE 0.05 % EX OINT: 1.0000 "application " | TOPICAL_OINTMENT | CUTANEOUS | 3 refills | Status: DC

## 2021-09-28 MED ORDER — ONDANSETRON HCL 4 MG/2ML IJ SOLN
4.0000 mg | Freq: Once | INTRAMUSCULAR | Status: AC
  Administered 2021-09-28: 4 mg via INTRAVENOUS
  Filled 2021-09-28: qty 2

## 2021-09-28 MED ORDER — METOCLOPRAMIDE HCL 5 MG/ML IJ SOLN
5.0000 mg | Freq: Once | INTRAMUSCULAR | Status: AC
  Administered 2021-09-28: 5 mg via INTRAVENOUS
  Filled 2021-09-28: qty 2

## 2021-09-28 MED ORDER — PROCHLORPERAZINE EDISYLATE 10 MG/2ML IJ SOLN
10.0000 mg | Freq: Once | INTRAMUSCULAR | Status: AC
  Administered 2021-09-28: 10 mg via INTRAVENOUS
  Filled 2021-09-28: qty 2

## 2021-09-28 MED ORDER — SODIUM CHLORIDE (PF) 0.9 % IJ SOLN
INTRAMUSCULAR | Status: AC
  Filled 2021-09-28: qty 100

## 2021-09-28 MED ORDER — ALUM & MAG HYDROXIDE-SIMETH 200-200-20 MG/5ML PO SUSP
30.0000 mL | Freq: Once | ORAL | Status: DC
  Filled 2021-09-28: qty 30

## 2021-09-28 MED ORDER — LIDOCAINE 5 % EX PTCH
1.0000 | MEDICATED_PATCH | CUTANEOUS | Status: DC
  Filled 2021-09-28: qty 1

## 2021-09-28 MED ORDER — HYDRALAZINE HCL 20 MG/ML IJ SOLN
10.0000 mg | Freq: Once | INTRAMUSCULAR | Status: AC
  Administered 2021-09-28: 10 mg via INTRAVENOUS
  Filled 2021-09-28: qty 1

## 2021-09-28 NOTE — ED Triage Notes (Signed)
Pt arrived via POV, c/o left sided chest pain and SOB that started approx 9mins ago while walking to the car after drs apt. Non reproducible.  ?

## 2021-09-28 NOTE — ED Provider Triage Note (Signed)
Emergency Medicine Provider Triage Evaluation Note ? ?Krystal Craig , a 88 y.o. female  was evaluated in triage.  Pt complains of chest pain.  This started after she had walked to the car from the doctors office and they pulled away and had a sudden onset of left sided chest pain.  This started at 1120 am and lasted for 20 minutes.  Since then she has had slight intermittent pain.  She was short of breath with this.  No nausea/vomiting, diaphoresis, no near syncope.  Pain didn't radiate or move.  ? ?No aggravating or alleviating factors.  ? ?Physical Exam  ?BP (!) 156/87 (BP Location: Left Arm)   Pulse 73   Temp 97.6 ?F (36.4 ?C) (Oral)   Resp 17   LMP  (LMP Unknown)   SpO2 95%  ?Gen:   Awake, no distress   ?Resp:  Normal effort, lung sounds present bilaterally ?MSK:   Moves extremities without difficulty  ?Other:  Normal speech  ? ?Medical Decision Making  ?Medically screening exam initiated at 12:33 PM.  Appropriate orders placed.  Krystal Craig was informed that the remainder of the evaluation will be completed by another provider, this initial triage assessment does not replace that evaluation, and the importance of remaining in the ED until their evaluation is complete. ? ? ?  ?Lorin Glass, [place] ?09/28/21 1240 ? ?

## 2021-09-28 NOTE — ED Notes (Signed)
Pt successfully ambulated to restroom and voided ?

## 2021-09-28 NOTE — Progress Notes (Signed)
? ? ?  Krystal Craig 1932/09/01 [id number] ? ? ?     88 y.o.  G2P2L2  Accompanied by her daughter ? ?RP: F/U vulvar irritation associated with Lichen Sclerosus ? ?HPI: Feeling much better, less burning, itching and irritation at the vulva.  Used Clobetasol ointment twice a day x 2 wks then slowly weaned to twice a week.  Lately, her daughter could not locate her ointment tube, so probably not using it twice a week. No pelvic pain.  No vaginal bleeding. Urine/BMs normal. ? ? ?OB History  ?Gravida Para Term Preterm AB Living  ?2 2       2   ?SAB IAB Ectopic Multiple Live Births  ?           ?  ?# Outcome Date GA Lbr Len/2nd Weight Sex Delivery Anes PTL Lv  ?2 Para           ?1 Para           ? ? ?Past medical history,surgical history, problem list, medications, allergies, family history and social history were all reviewed and documented in the EPIC chart. ? ? ?Directed ROS with pertinent positives and negatives documented in the history of present illness/assessment and plan. ? ?Exam: ? ?Vitals:  ? 09/28/21 1047  ?BP: 122/80  ? ?General appearance:  Normal ? ?Gynecologic exam: Vulva much improved.  Minimal erythema, no fissure.  Whitening and atrophy still present.   ? ? ?Assessment/Plan:  88 y.o. G2P2  ? ?1. Lichen sclerosus et atrophicus of the vulva ?Feeling much better, less burning, itching and irritation at the vulva.  Used Clobetasol ointment twice a day x 2 wks then slowly weaned to twice a week.  Lately, her daughter could not locate her ointment tube, so probably not using it twice a week. No pelvic pain.  No vaginal bleeding. Urine/BMs normal. ?Improved Lichen Sclerosus on Clobetasol ointment.  Needs to continue long term with Clobetasol ointment 86% thin application on affected vulva twice a week. Recommendations shared with patient and her daughter. Prescription sent to pharmacy. ? ?Other orders ?- clobetasol ointment (TEMOVATE) 0.05 %; Apply 1 application. topically 2 (two) times a week. Thin  application on affected vulva.  ? ?Princess Bruins MD, 11:11 AM 09/28/2021 ? ? ? ?  ?

## 2021-09-28 NOTE — ED Provider Notes (Signed)
?[facility] DEPT ?Provider Note ? ? ?CSN: [id number] ?Arrival date & time: 09/28/21  1135 ? ?  ? ?History ? ?Chief Complaint  ?Patient presents with  ? Chest Pain  ? ? ?Krystal Craig is a 88 y.o. female. ? ? Patient as above with significant medical history as below, including hyperlipidemia, IBS, CKD, dementia, GERD who presents to the ED with complaint of elevated blood pressure, headache.  Patient went to gynecology pulm earlier today, to get back to the car she started having left-sided chest pain.  Lasted few seconds and resolve spontaneously.  Left lower chest wall underneath her left breast.  Not associate with nausea or vomiting.  She has been having a headache for the past 2 to 3 days, described as 8 out of 10.  Did not describe the headache is the worst headache of her life.  She denies trauma.  ? ?She denies history of chronic headaches.  Also denies history of elevated blood pressure. ? ? ? ? ?Past Medical History: ?No date: Allergic rhinitis ?No date: Anemia ?05/26/2018: Aortic atherosclerosis ?04/07/2019: Barrett's esophagus with dysplasia ?02/28/2014: CKD (chronic kidney disease), stage III ([place]) ?No date: Complication of anesthesia ?05/26/2018: Degenerative arthritis of lumbar spine ?    Comment:  Evaluation by Dr. Ernestina Patches in 9/19. A/P: Chronic history  ?             of low back pain and radicular pain status post lumbar  ?             discectomy and laminectomy by Dr. Melina Schools in 2016.  ?             No recent MRI imaging but no real red flag complaints at  ?             this point other than she is getting pain in the low back ?             that is problematic over time it just has her at this  ?             point where she is limited in certain things she can do  ?             in its  ?No date: Dementia [facility]) ?06/26/2007: Diaphragmatic hernia ?No date: Diverticulosis ?06/26/2007: GERD (gastroesophageal reflux disease) ?No date: Hiatal hernia ?No date: History of  bronchitis ?2006: History of cancer of lung ?    Comment:  2006 20% of left lung resected. Former smoker. Dr. Lenna Gilford ?             was doing CXR yearly. 09/2017 IMPRESSION: No interval  ?             change.  No acute cardiopulmonary findings. Postsurgical  ?             change LEFT lung base. ?09/25/2011: Hyperlipidemia ?No date: IBS (irritable bowel syndrome) ?No date: Impacted cerumen of both ears ?No date: Insomnia ?05/31/2018: Insulin resistance ?06/26/2007: Irritable bowel syndrome ?No date: Major depression disorder in remission ?07/30/2019: Major neurocognitive disorder ([place]) ?No date: Osteoarthritis ?No date: Osteopenia ?No date: PONV (postoperative nausea and vomiting) ?No date: Pre-diabetes ?12/28/2015: Primary osteoarthritis of both first carpometacarpal  ?joints ?No date: Psoriasis ?No date: Pure hypercholesterolemia ?11/06/2016: Situational anxiety ?09/25/2011: Thoracic lymphadenopathy ?No date: Vitamin D deficiency ? ?Past Surgical History: ?1969: ABDOMINAL HYSTERECTOMY ?No date: APPENDECTOMY ?2001: bilateral shouler operations for impingement syndromes;  ?Bilateral ?No date: BUNIONECTOMY; Left ?  2012: CATARACT EXTRACTION, BILATERAL; Bilateral ?1976: CHOLECYSTECTOMY ?No date: COLONOSCOPY ?No date: LAPAROSCOPIC LYSIS INTESTINAL ADHESIONS ?11/2004: left lower lobectomy ?    Comment:  via VATS and mini-thoractomy for 1.5cm adenoca of lung  ?             by Dr. Arlyce Dice ?01/05/2015: LUMBAR LAMINECTOMY/DECOMPRESSION MICRODISCECTOMY; Left ?    Comment:  Procedure: L3-4 DECOMPRESSION MICRODISCECTOMY ON LEFT;   ?             Surgeon: Melina Schools, MD;  Location: [facility];  Service:  ?             Orthopedics;  Laterality: Left;  left Lumbar 3-4 ?No date: POLYPECTOMY ?09/2013: turmor ear; Right ?03/2007: urologic sugery with cystocele/rectocele repairs and sling ?    Comment:  Dr. Terance Hart  ? ? ?The history is provided by a relative and the patient. No language interpreter was used.  ?Chest Pain ?Associated symptoms:  abdominal pain and headache   ?Associated symptoms: no cough, no dysphagia, no fever, no nausea, no palpitations, no shortness of breath and no vomiting   ? ?  ? ?Home Medications ?Prior to Admission medications   ?Medication Sig Start Date End Date Taking? Authorizing Provider  ?amLODipine (NORVASC) 2.5 MG tablet Take 1 tablet (2.5 mg total) by mouth daily for 14 days. 09/29/21 10/13/21 Yes Wynona Dove A, DO  ?clobetasol ointment (TEMOVATE) 2.02 % Apply 1 application. topically 2 (two) times a week. Thin application on affected vulva. ?Patient taking differently: Apply 1 application. topically See admin instructions. Apply a thin application on affected vulva 2 times a week 10/01/21  Yes Princess Bruins, MD  ?simvastatin (ZOCOR) 40 MG tablet Take 1 tablet (40 mg total) by mouth at bedtime. 07/13/21  Yes [name], PA  ?esomeprazole (NEXIUM) 20 MG capsule Take 1 capsule (20 mg total) by mouth daily at 12 noon. ?Patient not taking: Reported on 09/28/2021 04/07/19   Ladene Artist, MD  ?   ? ?Allergies    ?Ciprocin-fluocin-procin [fluocinolone], Ciprofloxacin, Codeine, Sulfamethoxazole-trimethoprim, and Wellbutrin [bupropion]   ? ?Review of Systems   ?Review of Systems  ?Constitutional:  Negative for chills and fever.  ?HENT:  Negative for facial swelling and trouble swallowing.   ?Eyes:  Negative for photophobia and visual disturbance.  ?Respiratory:  Negative for cough and shortness of breath.   ?Cardiovascular:  Positive for chest pain. Negative for palpitations.  ?Gastrointestinal:  Positive for abdominal pain. Negative for nausea and vomiting.  ?Endocrine: Negative for polydipsia and polyuria.  ?Genitourinary:  Negative for difficulty urinating and hematuria.  ?Musculoskeletal:  Negative for gait problem and joint swelling.  ?Skin:  Negative for pallor and rash.  ?Neurological:  Positive for light-headedness and headaches. Negative for syncope.  ?Psychiatric/Behavioral:  Negative for agitation and  confusion.   ? ?Physical Exam ?Updated Vital Signs ?BP (!) 152/66   Pulse 84   Temp 97.6 ?F (36.4 ?C) (Oral)   Resp 18   LMP  (LMP Unknown)   SpO2 90%  ?Physical Exam ?Vitals and nursing note reviewed.  ?Constitutional:   ?   General: She is awake. She is not in acute distress. ?   Appearance: Normal appearance. She is well-developed and well-groomed. She is not ill-appearing.  ?HENT:  ?   Head: Normocephalic and atraumatic. No raccoon eyes, Battle's sign, contusion, right periorbital erythema or left periorbital erythema.  ?   Jaw: There is normal jaw occlusion. No trismus.  ?   Right Ear: External ear normal.  ?  Left Ear: External ear normal.  ?   Nose: Nose normal.  ?   Mouth/Throat:  ?   Mouth: Mucous membranes are moist.  ?Eyes:  ?   General: Vision grossly intact. Gaze aligned appropriately. No scleral icterus.    ?   Right eye: No discharge.     ?   Left eye: No discharge.  ?   Extraocular Movements: Extraocular movements intact.  ?   Right eye: Normal extraocular motion.  ?   Left eye: Normal extraocular motion.  ?   Conjunctiva/sclera: Conjunctivae normal.  ?Neck:  ?   Trachea: Trachea and phonation normal. No tracheostomy.  ?   Comments: No carotid bruit ?Cardiovascular:  ?   Rate and Rhythm: Normal rate and regular rhythm.  ?   Pulses: Normal pulses.     ?     Radial pulses are 2+ on the right side and 2+ on the left side.  ?     Dorsalis pedis pulses are 2+ on the right side and 2+ on the left side.  ?   Heart sounds: Normal heart sounds.  ?  No S3 or S4 sounds.  ?Pulmonary:  ?   Effort: Pulmonary effort is normal. No respiratory distress.  ?   Breath sounds: Normal breath sounds.  ?Abdominal:  ?   General: Abdomen is flat.  ?   Tenderness: There is no abdominal tenderness.  ?Musculoskeletal:     ?   General: Normal range of motion.  ?   Cervical back: Full passive range of motion without pain and normal range of motion.  ?   Right lower leg: No edema.  ?   Left lower leg: No edema.  ?Skin: ?    General: Skin is warm and dry.  ?   Capillary Refill: Capillary refill takes less than 2 seconds.  ?Neurological:  ?   Mental Status: She is alert, oriented to person, place, and time and easily aroused. Mental stat

## 2021-09-28 NOTE — Discharge Instructions (Signed)
It was a pleasure caring for you today in the emergency department. ° °Please return to the emergency department for any worsening or worrisome symptoms. ° ° °

## 2021-10-01 ENCOUNTER — Encounter: Payer: Self-pay | Admitting: Physician Assistant

## 2021-10-01 ENCOUNTER — Ambulatory Visit (INDEPENDENT_AMBULATORY_CARE_PROVIDER_SITE_OTHER): Payer: Medicare Other | Admitting: Physician Assistant

## 2021-10-01 VITALS — BP 170/90 | HR 79 | Temp 98.0°F | Ht 63.0 in | Wt 157.4 lb

## 2021-10-01 DIAGNOSIS — I517 Cardiomegaly: Secondary | ICD-10-CM | POA: Diagnosis not present

## 2021-10-01 DIAGNOSIS — K861 Other chronic pancreatitis: Secondary | ICD-10-CM

## 2021-10-01 DIAGNOSIS — Z902 Acquired absence of lung [part of]: Secondary | ICD-10-CM

## 2021-10-01 DIAGNOSIS — E785 Hyperlipidemia, unspecified: Secondary | ICD-10-CM

## 2021-10-01 DIAGNOSIS — I10 Essential (primary) hypertension: Secondary | ICD-10-CM

## 2021-10-01 DIAGNOSIS — R350 Frequency of micturition: Secondary | ICD-10-CM

## 2021-10-01 DIAGNOSIS — R7303 Prediabetes: Secondary | ICD-10-CM | POA: Insufficient documentation

## 2021-10-01 DIAGNOSIS — E559 Vitamin D deficiency, unspecified: Secondary | ICD-10-CM | POA: Insufficient documentation

## 2021-10-01 DIAGNOSIS — F324 Major depressive disorder, single episode, in partial remission: Secondary | ICD-10-CM | POA: Insufficient documentation

## 2021-10-01 DIAGNOSIS — R110 Nausea: Secondary | ICD-10-CM | POA: Diagnosis not present

## 2021-10-01 DIAGNOSIS — Z8619 Personal history of other infectious and parasitic diseases: Secondary | ICD-10-CM | POA: Insufficient documentation

## 2021-10-01 DIAGNOSIS — R051 Acute cough: Secondary | ICD-10-CM

## 2021-10-01 HISTORY — DX: Acquired absence of lung (part of): Z90.2

## 2021-10-01 LAB — LIPASE: Lipase: 53 U/L (ref 11.0–59.0)

## 2021-10-01 LAB — LIPID PANEL
Cholesterol: 201 mg/dL — ABNORMAL HIGH (ref 0–200)
HDL: 66 mg/dL (ref >39.00)
LDL Cholesterol: 102 mg/dL — ABNORMAL HIGH (ref 0–99)
NonHDL: 135.41
Total CHOL/HDL Ratio: 3
Triglycerides: 167 mg/dL — ABNORMAL HIGH (ref 0.0–149.0)
VLDL: 33.4 mg/dL (ref 0.0–40.0)

## 2021-10-01 LAB — TSH: TSH: 1.18 u[IU]/mL (ref 0.35–5.50)

## 2021-10-01 LAB — BASIC METABOLIC PANEL
BUN: 12 mg/dL (ref 6–23)
CO2: 28 mEq/L (ref 19–32)
Calcium: 9.4 mg/dL (ref 8.4–10.5)
Chloride: 103 mEq/L (ref 96–112)
Creatinine, Ser: 1 mg/dL (ref 0.40–1.20)
GFR: 51.03 mL/min — ABNORMAL LOW (ref >60.00)
Glucose, Bld: 96 mg/dL (ref 70–99)
Potassium: 4.5 mEq/L (ref 3.5–5.1)
Sodium: 141 mEq/L (ref 135–145)

## 2021-10-01 MED ORDER — AMLODIPINE BESYLATE 5 MG PO TABS: 5.0000 mg | ORAL_TABLET | Freq: Every day | ORAL | 1 refills | Status: DC

## 2021-10-01 MED ORDER — AZITHROMYCIN 250 MG PO TABS: ORAL_TABLET | ORAL | 0 refills | Status: AC

## 2021-10-01 NOTE — Progress Notes (Signed)
Krystal Craig is a 88 y.o. female here for a ED follow up. ? ?History of Present Illness:  ? ?Chief Complaint  ?Patient presents with  ? Follow-up  ?  Pt was seen in the ED on 4/14 for Chest pain and elevated blood pressure.  ? Headache  ?  Pt has been c/o headaches off and on for the past week or so. Is checking blood pressure at home this AM was 191/90.  ? ?Krystal Craig presented to today's visit with her daughter, Krystal Craig.  ? ?HPI ? ?ED Follow Up of Chest Pain ?On 09/28/21, Krystal Craig presented to the ED with c/o chest pain that had occurred earlier that day. Pt states that following a gynecology appointment, she went to get back into her car when she experienced left-sided chest pain that was positioned at her left lower chest wall underneath her left breast. Although the pain lasted for a couple of seconds before resolving, she decided to have this further evaluated. Upon further evaluation, pt admits that for the past 2-3 days she had been dealing with a headache that she rated to be an 8 out of 10 in terms of pain.  ? ?Due to sx, further workup was completed including a EKG, CBC, BMP, Lipase, Troponin, Hepatic Function panel, CT head, CTA and CXR. Labs overall normal. In regards to her imaging, there was no evidence of acute cardiopulmonary process, but there were multiple findings including mild atheromatous disease, age related cerebral atrophy with chronic small vessel ischemic disease, mildly enlarged mediastinal adenopathy, emphysema, fatty liver, changes of chronic pancreatitis, mild cardiomegaly, aortic arthrosclerosis, and minimal tree-in-bud opacities in the right middle lobe likely infectious/inflammatory. Pt was also found to have an elevated BP of 152/66.  ? ?Upon discharge she was prescribed norvasc 2.5 mg daily and instructed to follow up with PCP for further management.  ? ?During today's visit, her daughter reports that Krystal Craig has been compliant with taking norvasc 2.5 mg daily since discharge with no  complications. Although her BP has been elevated at home with readings such as 191/90, she has not experienced any worsening headaches. According to her daughter, it has been staying in this range for the past couple of days seemingly without a cause.   Denies chest pain, SOB, blurred vision, dizziness, unusual headaches, lower leg swelling, excessive caffeine intake, stimulant usage, excessive alcohol intake, or increase in salt consumption. ? ?BP Readings from Last 3 Encounters:  ?10/01/21 (!) 170/90  ?09/28/21 (!) 152/66  ?09/28/21 122/80  ? ? ?Cough ?While in the ER on 09/28/21, pt underwent a CXR and was found to have minimal tree-in-bud opacities in the right middle lobe likely infectious/inflammatory. Her daughter states they were unaware of this recent result but it does potentially explain why Krystal Craig has been coughing deeply and frequently lately.  Pt does have hx of lung cancer and has had her left lower lobe removed due to this. She was previously being monitored for this past issue by Dr. Lamonte Sakai, pulmonology but has not returned since 2021. At this time she is interested in re-visiting pulmonology for further evaluation of this.  ? ?HLD  ?Since prior visit in April of 2021, pt has remained compliant with taking simvastatin 40 mg daily with no complications. Despite this, she was found to have cardiomegaly on recent imaging obtained during her ER visit. Despite this being normal for her age, she is in agreement to have this further evaluated due to her HLD.  ? ?Pancreatitis  ?In combination to  the other findings on her imaging in the ER, she was also found to have changes in chronic pancreatitis. While she wasn't aware that this dx was standing, she does admit she has been dealing with chronic nausea for some time. Her daughter states that at this point she will experience at least 1 day a week where she is on the cough all day due to her nausea. Pt has trialed nexium in the past for this issue following  GI visit but this didn't help. She is interested in getting this further evaluated and regulated if possible. ? ?Frequent Urination ?Lastly, Krystal Craig has been experiencing frequent urination for the past 3 days. States that prior to going to the ED she was not having this issue. While she does believe it could be caused by her staying hydrated, she would like to have this further evaluated. Denies fever, chills, dysuria, hematuria, abnormal vaginal discharge, or burning with urination.  ? ?Past Medical History:  ?Diagnosis Date  ? Allergic rhinitis   ? Anemia   ? Aortic atherosclerosis 05/26/2018  ? Barrett's esophagus with dysplasia 04/07/2019  ? CKD (chronic kidney disease), stage III ([place]) 02/28/2014  ? Complication of anesthesia   ? Degenerative arthritis of lumbar spine 05/26/2018  ? Evaluation by Dr. Ernestina Patches in 9/19. A/P: Chronic history of low back pain and radicular pain status post lumbar discectomy and laminectomy by Dr. Melina Schools in 2016.  No recent MRI imaging but no real red flag complaints at this point other than she is getting pain in the low back that is problematic over time it just has her at this point where she is limited in certain things she can do in its   ? Dementia ([place])   ? Diaphragmatic hernia 06/26/2007  ? Diverticulosis   ? GERD (gastroesophageal reflux disease) 06/26/2007  ? Hiatal hernia   ? History of bronchitis   ? History of cancer of lung 2006  ? 2006 20% of left lung resected. Former smoker. Dr. Lenna Gilford was doing CXR yearly. 09/2017 IMPRESSION: No interval change.  No acute cardiopulmonary findings. Postsurgical change LEFT lung base.  ? Hyperlipidemia 09/25/2011  ? IBS (irritable bowel syndrome)   ? Impacted cerumen of both ears   ? Insomnia   ? Insulin resistance 05/31/2018  ? Irritable bowel syndrome 06/26/2007  ? Major depression disorder in remission   ? Major neurocognitive disorder ([place]) 07/30/2019  ? Osteoarthritis   ? Osteopenia   ? PONV (postoperative nausea and vomiting)    ? Pre-diabetes   ? Primary osteoarthritis of both first carpometacarpal joints 12/28/2015  ? Psoriasis   ? Pure hypercholesterolemia   ? Situational anxiety 11/06/2016  ? Thoracic lymphadenopathy 09/25/2011  ? Vitamin D deficiency   ? ?  ?Social History  ? ?Tobacco Use  ? Smoking status: Former  ?  Packs/day: 1.00  ?  Years: 30.00  ?  Pack years: 30.00  ?  Types: Cigarettes  ?  Quit date: 06/17/1984  ?  Years since quitting: 37.3  ? Smokeless tobacco: Never  ?Vaping Use  ? Vaping Use: Never used  ?Substance Use Topics  ? Alcohol use: No  ?  Alcohol/week: 0.0 standard drinks  ? Drug use: No  ? ? ?Past Surgical History:  ?Procedure Laterality Date  ? ABDOMINAL HYSTERECTOMY  1969  ? APPENDECTOMY    ? bilateral shouler operations for impingement syndromes Bilateral 2001  ? BUNIONECTOMY Left   ? CATARACT EXTRACTION, BILATERAL Bilateral 2012  ? CHOLECYSTECTOMY  1976  ?  COLONOSCOPY    ? LAPAROSCOPIC LYSIS INTESTINAL ADHESIONS    ? left lower lobectomy  11/2004  ? via VATS and mini-thoractomy for 1.5cm adenoca of lung by Dr. Arlyce Dice  ? LUMBAR LAMINECTOMY/DECOMPRESSION MICRODISCECTOMY Left 01/05/2015  ? Procedure: L3-4 DECOMPRESSION MICRODISCECTOMY ON LEFT;  Surgeon: Melina Schools, MD;  Location: [facility];  Service: Orthopedics;  Laterality: Left;  left Lumbar 3-4  ? POLYPECTOMY    ? turmor ear Right 09/2013  ? urologic sugery with cystocele/rectocele repairs and sling  03/2007  ? Dr. Terance Hart  ? ? ?Family History  ?Problem Relation Age of Onset  ? Breast cancer Sister 6  ? Memory loss Sister   ? Dementia Mother   ? Heart attack Mother   ? CVA Father   ? Stroke Father   ? Colon cancer Neg Hx   ? Esophageal cancer Neg Hx   ? Pancreatic cancer Neg Hx   ? Rectal cancer Neg Hx   ? Stomach cancer Neg Hx   ? Colon polyps Neg Hx   ? ? ?Allergies  ?Allergen Reactions  ? Ciprocin-Fluocin-Procin [Fluocinolone] Other (See Comments)  ?  Leg and arm numbness  ? Ciprofloxacin Other (See Comments)  ?  Extreme weakness  ? Codeine Other (See  Comments)  ?  Chest pain/tightness  ? Sulfamethoxazole-Trimethoprim Nausea Only  ? Wellbutrin [Bupropion] Rash  ? ? ?Current Medications:  ? ?Current Outpatient Medications:  ?  amLODipine (NORVASC) 5 MG

## 2021-10-01 NOTE — Patient Instructions (Signed)
It was great to see you! ? ?Increase norvasc to 5 mg daily. ?Record BP daily. ?Follow-up with me in 2-4 weeks to recheck this. ?Please call pulmonology (see below) ?Start antibiotic for your cough ?We will be in touch via mychart with all results ? ?CT SCAN RESULT FROM THE ER: ?1. No evidence for aortic dissection or aneurysm. ? ?2. Minimal tree-in-bud opacities in the right middle lobe likely ?infectious/inflammatory. -- I'm starting an antibiotic for this (azithromycin), but you MUST follow-up with your pulmonologist about this. You are supposed to see them yearly. Please call [facility] to follow-up with Dr. Lamonte Sakai [phone] ? ?3. Mild cardiomegaly -- I'm going to order an ultrasound of your heart. ? ?4. Fatty infiltration of the liver. -- I'm referring to GI for this, please schedule an appointment. ? ?5. Changes of chronic pancreatitis. -- I'm referring to GI for this, please schedule an appointment. ? ?6.  Aortic Atherosclerosis (ICD10-I70.0). -- Plaque in your aorta --- very common with age. We will update your cholesterol panel today and adjust cholesterol medication as indicated. ? ?Take care, ? ?[name] PA-C  ?

## 2021-10-02 ENCOUNTER — Other Ambulatory Visit: Payer: Self-pay | Admitting: Physician Assistant

## 2021-10-02 DIAGNOSIS — E785 Hyperlipidemia, unspecified: Secondary | ICD-10-CM

## 2021-10-02 LAB — URINALYSIS, ROUTINE W REFLEX MICROSCOPIC
Bilirubin Urine: NEGATIVE
Hgb urine dipstick: NEGATIVE
Ketones, ur: NEGATIVE
Leukocytes,Ua: NEGATIVE
Nitrite: NEGATIVE
RBC / HPF: NONE SEEN (ref 0–?)
Specific Gravity, Urine: 1.01 (ref 1.000–1.030)
Total Protein, Urine: 30 — AB
Urine Glucose: NEGATIVE
Urobilinogen, UA: 0.2 (ref 0.0–1.0)
pH: 7.5 (ref 5.0–8.0)

## 2021-10-02 LAB — URINE CULTURE
MICRO NUMBER:: 13272627
SPECIMEN QUALITY:: ADEQUATE

## 2021-10-02 MED ORDER — ATORVASTATIN CALCIUM 20 MG PO TABS: 20.0000 mg | ORAL_TABLET | Freq: Every day | ORAL | 1 refills | Status: DC

## 2021-10-08 ENCOUNTER — Other Ambulatory Visit: Payer: Self-pay | Admitting: Physician Assistant

## 2021-10-08 ENCOUNTER — Telehealth: Payer: Self-pay

## 2021-10-08 DIAGNOSIS — K861 Other chronic pancreatitis: Secondary | ICD-10-CM

## 2021-10-08 NOTE — Telephone Encounter (Signed)
   Daughter is calling in regard to referral to GI and Ultrasound order.    I do not see either placed.  Please follow up in regard.

## 2021-10-08 NOTE — Telephone Encounter (Signed)
Please see message and advise 

## 2021-10-09 NOTE — Telephone Encounter (Signed)
Spoke to pt's daughter [name], told her Aldona Bar placed the referral for GI pt has has seen GI in the past just need to call and schedule an appt. [name] said she does not know who pt saw? Told her she saw Dr. Fuller Plan with [facility], number given to her. Also someone should contact her to schedule Echo. [name] verbalized understanding. ?

## 2021-10-10 ENCOUNTER — Ambulatory Visit: Payer: Medicare Other | Admitting: Physician Assistant

## 2021-10-10 ENCOUNTER — Encounter: Payer: Self-pay | Admitting: Physician Assistant

## 2021-10-10 VITALS — BP 138/78 | HR 76 | Ht 63.0 in | Wt 157.1 lb

## 2021-10-10 DIAGNOSIS — K861 Other chronic pancreatitis: Secondary | ICD-10-CM | POA: Diagnosis not present

## 2021-10-10 DIAGNOSIS — F418 Other specified anxiety disorders: Secondary | ICD-10-CM

## 2021-10-10 DIAGNOSIS — K22719 Barrett's esophagus with dysplasia, unspecified: Secondary | ICD-10-CM | POA: Diagnosis not present

## 2021-10-10 DIAGNOSIS — K219 Gastro-esophageal reflux disease without esophagitis: Secondary | ICD-10-CM

## 2021-10-10 DIAGNOSIS — R110 Nausea: Secondary | ICD-10-CM | POA: Diagnosis not present

## 2021-10-10 MED ORDER — PANTOPRAZOLE SODIUM 40 MG PO TBEC: DELAYED_RELEASE_TABLET | ORAL | 0 refills | Status: DC

## 2021-10-10 NOTE — Patient Instructions (Addendum)
Please take your proton pump inhibitor medication 30 minutes to 1 hour before meals- this makes it more effective.  ?Avoid spicy and acidic foods ?Avoid fatty foods ?Limit your intake of coffee, tea, alcohol, and carbonated drinks ?Work to maintain a healthy weight ?Keep the head of the bed elevated at least 3 inches with blocks or a wedge pillow if you are having any nighttime symptoms ?Stay upright for 2 hours after eating ?Avoid meals and snacks three to four hours before bedtime ? ?No aleve, ibuprofen, goody powders, as these are antiinflammatories and can cause inflammation in your stomach, increase bleeding risk and cause ulcers.  ?You can talk with PCP about alternative pain options.  ?Can do tyelnol max 3000 mg a day, salon pas patches are over the counter and voltern gel is topical antiinflammatory that is safe.  ? ?Barrett's Esophagus ? ?Barrett's esophagus occurs when the tissue that lines the esophagus changes or becomes damaged. The esophagus is the tube that carries food from the throat to the stomach. With Barrett's esophagus, the cells that line the esophagus are replaced by cells that are similar to the lining of the intestines (intestinal metaplasia). ?Barrett's esophagus itself may not cause any symptoms. However, many people who have Barrett's esophagus also have gastroesophageal reflux disease (GERD), which may cause symptoms such as heartburn. Over time, a few people with this condition may develop cancer of the esophagus. Treatment may include medicines, procedures to destroy the abnormal cells, or surgery. ?What are the causes? ?The exact cause of this condition is not known. In some cases, the condition develops from damage to the lining of the esophagus caused by gastroesophageal reflux disease (GERD). GERD occurs when stomach acids flow up from the stomach into the esophagus. Frequent symptoms of GERD may cause intestinal metaplasia or cause cell changes (dysplasia). ?What increases the  risk? ?You are more likely to develop this condition if you: ?Have GERD. ?Are female. ?Are of European descent. ?Are obese. ?Are older than 50. ?Have a hiatal hernia. This is a condition in which part of your stomach bulges into your chest. ?Smoke. ?What are the signs or symptoms? ?People with Barrett's esophagus often have no symptoms. However, many people with this condition also have GERD. Symptoms of GERD may include: ?Heartburn. ?Difficulty swallowing. ?Dry cough. ?How is this diagnosed? ?This condition may be diagnosed based on: ?Results of an upper gastrointestinal endoscopy. For this exam, a thin, flexible tube with a light and a camera on the end (endoscope) is passed down your esophagus. Your health care provider can view the inside of your esophagus during this procedure. ?Results of a biopsy. For this procedure, several tissue samples are removed (biopsy) from your esophagus to look at under a microscope. They are then checked for intestinal metaplasia or dysplasia. ?How is this treated? ?Treatment for this condition may include: ?Medicines (proton pump inhibitors, or PPIs) to decrease or stop GERD. ?Periodic endoscopic exams to make sure that cancer is not developing. ?A procedure or surgery for dysplasia. This may include: ?Removal or destruction of abnormal cells. ?Removal of part of the esophagus. ?Follow these instructions at home: ?Eating and drinking ?Eat more fruits and vegetables. ?Avoid fatty foods. ?Eat small, frequent meals instead of large meals. ?Avoid foods that cause heartburn. These foods include: ?Coffee and alcoholic drinks. ?Tomatoes and foods made with tomatoes. ?Greasy or spicy foods. ?Chocolate and peppermint. ?Do not drink alcohol. ?General instructions ?Take over-the-counter and prescription medicines only as told by your health care provider. ?Do  not use any products that contain nicotine or tobacco, such as cigarettes, e-cigarettes, and chewing tobacco. If you need help quitting,  ask your health care provider. ?If you are being treated for GERD, make sure you take medicines and follow all instructions as told by your health care provider. ?Keep all follow-up visits as told by your health care provider. This is important. ?Contact a health care provider if: ?You have heartburn or GERD symptoms. ?You have difficulty swallowing. ?Get help right away if: ?You have chest pain. ?You are unable to swallow. ?You vomit blood or material that looks like coffee grounds. ?Your stool (feces) is bright red or dark. ?These symptoms may represent a serious problem that is an emergency. Do not wait to see if the symptoms will go away. Get medical help right away. Call your local emergency services (911 in the [place] Do not drive yourself to the hospital. ?Summary ?Barrett's esophagus occurs when the tissue that lines the esophagus changes or becomes damaged. ?Barrett's esophagus may be diagnosed with an upper gastrointestinal endoscopy and a biopsy. ?Treatment may include medicines, procedures to remove abnormal cells, or surgery. ?Follow your health care provider's instructions about what to eat and drink, what medicines to take, and when to call for help. ?This information is not intended to replace advice given to you by your health care provider. Make sure you discuss any questions you have with your health care provider. ?Document Revised: 08/21/2019 Document Reviewed: 08/21/2019 ?Elsevier Patient Education ? Center. ? ?Chronic Pancreatitis- NOT ACUTE ?You don't have any symptoms I do not think we need to pursue further imaging, if things change can consider MRCP or not better.  ? ?Chronic pancreatitis is permanent inflammation and scarring of the pancreas that leads to pancreatic dysfunction. The pancreas is a gland that is found behind the stomach. The pancreas makes proteins (enzymes) that help to digest food. It also releases hormones called glucagon and insulin. These help regulate blood  sugar (glucose). Damage to the pancreas may affect digestion, may cause pain in the upper abdomen and back, and may cause diabetes. Inflammation can also irritate other organs in the abdomen near the pancreas. ?At first, pancreatitis may be sudden (acute). When you have repeated or long-lasting episodes of acute pancreatitis, damage to the pancreas can be permanent and lead to chronic pancreatitis. Sometimes, though, there is no history of acute pancreatitis. ?What are the causes? ?The most common cause of this condition is heavy alcohol use. Other causes include: ?Hypertriglyceridemia. This is increased, or elevated, levels of triglycerides in the blood. ?Gallstones or other conditions that block the tube that drains the pancreas (pancreatic duct). ?Health conditions such as pancreatic cancer or a problem where the body's defense system (immune system) attacks the pancreas (autoimmune pancreatitis). ?Hypercalcemia. This is elevated calcium levels in the blood. This condition may be caused by the parathyroid gland being too active (hyperparathyroidism). ?Having an injured or infected pancreas. ?Being exposed to certain medicines or certain chemicals. ?In children, chronic pancreatitis is most often caused by inherited conditions. These come from genes that are passed from parent to child. The most common of these conditions is cystic fibrosis. ?In some cases, the cause of chronic pancreatitis may not be known. ?What increases the risk? ?This condition is more likely to develop in people who: ?Are female. ?Are 46-61 years old. ?Have a family history of pancreatitis. ?Smoke tobacco. ?Drink a lot of alcohol over a long period of time. ?What are the signs or symptoms? ?Symptoms  of this condition may include: ?Pain in the abdomen or upper back. Pain may be severe and often gets worse after you eat. ?Nausea and vomiting. ?Fever. ?Weight loss. ?A change in the color and firmness (consistency) of stool (feces), such as stools  that are oily, fatty, or clay-colored. ?How is this diagnosed? ?This condition is diagnosed based on your symptoms, your medical history, and a physical exam. You may have tests, such as: ?Blood test

## 2021-10-10 NOTE — Progress Notes (Signed)
? ? ? ?10/10/2021 ?Krystal Craig ?[id number] ?February 28, 1933 ? ?Referring provider: [name], PA ?Primary GI doctor: Dr. Fuller Plan ? ?ASSESSMENT AND PLAN:  ? ?Episode of right upper quadrant abdominal pain leading to CTA in the ER without any acute findings.  Patient with known reflux, Barrett's esophagus, nausea. ? ? 02/03/2019 EGD showed only a variable Z line and a small hiatal hernia.  Gastric and duodenal biopsies were normal without H. pylori or celiac present.  Biopsies of the Z line did show changes consistent with Barrett's esophagus, no plan for repeat due to age.  ? ?Has been on NSAIDs for 3 to 4 months due to lower back pain and headache, had been off PPI for 4 to 5 months. ?Has some epigastric and left upper quadrant abdominal discomfort on palpation. ?No anemia, no leukocytosis no melena, no dysphagia. ?Most likely patient having gastritis secondary to NSAIDs, no alarm symptoms at this time, will place back on pantoprazole 40 mg twice daily for 6 weeks, can go down to once daily after that. ?Get follow-up 2 to 3 months with Dr. Fuller Plan. ?If continuing issues or any worsening issues could consider repeat endoscopy however at this point I do not think would be beneficial due to age and comorbidities. ? ?Nausea without vomiting ?Has had this chronically, has had work-up in the past, possibly gastritis with recent NSAIDs, GERD on PPI twice daily, not on anxiety medication at this time which has resolved in the past, can consider adding back on if PPI is not helpful ?-     pantoprazole (PROTONIX) 40 MG tablet; Take 1 tablet (40 mg total) by mouth 2 (two) times daily before a meal for 45 days, THEN 1 tablet (40 mg total) daily. ? ?Chronic pancreatitis, unspecified pancreatitis type ([place]) ?Seen on CTA in the ER. ?Patient's not diabetic, has no EPI symptoms. ?Normal LFTs. ?On CT no evidence of ductal dilatation of the pancreas or biliary ducts. ?To monitor for symptoms, can potentially get MRCP if any worsening  symptoms or concerns. ? ?Fatty liver ?Suspected non-alcoholic fatty liver disease by history and ultrasound.  ?--Continue to work on risk factor modification including diet exercise and control of risk factors including blood sugars. ? ? ? ?Patient Care Team: ?[name], [place] as PCP - General (Physician Assistant) ?Darleen Crocker, MD as Consulting Physician (Ophthalmology) ?Noralee Space, MD as Consulting Physician (Pulmonary Disease) ?Magnus Sinning, MD as Consulting Physician (Physical Medicine and Rehabilitation) ?Cameron Sprang, MD as Consulting Physician (Neurology) ? ?HISTORY OF PRESENT ILLNESS: ?88 y.o. female with a past medical history of depression/anxiety, CKD stage 3, history of lung cancer s/p resection left lobe 2006, dementia, s/p choely, GERD with hiatal hernia, [name], and others listed below presents for evaluation of chronic pancreatitis. ? ?Patient was recently seen in ER on 09/28/21 for chest and back pain, had CTA chest abdomen pelvis was unremarkable for acute process.  Did show calcifications throughout pancreas consistent with chronic pancreatitis, no inflammation no ductal dilatation, patient is status post cholecystectomy with no biliary ductal dilatation.   ?Showed known diffuse fatty infiltration of liver ?Patient had ER follow-up visit with family medicine, concern for chronic pancreatitis. ?Patient states she has nausea at least 1 day a week, has been evaluated for this in the past and very thorough.  Is on Nexium 20 mg once daily. ?In the past had failed Zofran, Phenergan, anxiolytics, patient resumed Lexapro in 2020 and nausea resolved ?Question GERD. ? ?Krystal Craig her daughter is here, one of 5 kids,  has 15 grand kids.  ?PAtient has history of [name].  ?Patient denies dysphagia, no melena.  ?Has been on ibuprofen and excederin q 4 hours for headache, was on for 7 days. Prior to that was on aleve as needed since Nov due to back pain, could be several times a day for several  days, pending on flare.  ?No ETOH.  ?She has been having severe LUQ AB pain with SOB, suddenly, no recent food. Went to the ER, had the CTA which did not show anything acute.  ?She has been off PPI x several months.  ?No weight loss, no diarrhea, no diabetes or evidence of pancreatis insufficiency.  ? ?Last seen 03/2019 with Dr. Fuller Plan in the office for nausea.  ? 02/03/2019 EGD showed only a variable Z line and a small hiatal hernia.  Gastric and duodenal biopsies were normal without H. pylori or celiac present.  Biopsies of the Z line did show changes consistent with Barrett's esophagus, no plan for repeat due to age.  ? ?02/08/2014 colonoscopy ?09/28/2021 CTA chest/AB/pelvis for chest and back pain-showed no evidence of aortic dissection or aneurysm, minimal tree-in-bud opacities right middle lobe likely infectious/inflammatory, mild cardiomegaly, fatty infiltration of liver, chronic pancreatitis changes with calcifications throughout the pancreas, no inflammation, no ductal dilatation, status postcholecystectomy with no biliary ductal dilatation. ? ?CBC  09/28/2021  ?HGB 13.1 MCV 87.8 without evidence of anemia ?WBC 9.4 Platelets 318 ?Anemia panel 03/22/2019  ?Iron 78 Ferritin 26  ?03/22/2019  B12 392 ?Kidney function 10/01/2021  ?BUN 12 Cr 1.00  ?GFR 49  ?Potassium 4.5   ?LFTs 09/28/2021  ?AST 27 ALT 27 ?Alkphos 78 TBili 1.3 ?10/01/2021 LIPASE 53.0 ?10/01/2021 TSH 1.18 ? ?Current Medications:  ? ? ?Current Outpatient Medications (Cardiovascular):  ?  amLODipine (NORVASC) 5 MG tablet, Take 1 tablet (5 mg total) by mouth daily. ?  atorvastatin (LIPITOR) 20 MG tablet, Take 1 tablet (20 mg total) by mouth daily. ? ? ?Current Outpatient Medications (Analgesics):  ?  ibuprofen (ADVIL) 200 MG tablet, Take 400 mg by mouth as needed. ? ? ?Current Outpatient Medications (Other):  ?  clobetasol ointment (TEMOVATE) 8.10 %, Apply 1 application. topically 2 (two) times a week. Thin application on affected vulva. (Patient taking  differently: Apply 1 application. topically See admin instructions. Apply a thin application on affected vulva 2 times a week) ?  pantoprazole (PROTONIX) 40 MG tablet, Take 1 tablet (40 mg total) by mouth 2 (two) times daily before a meal for 45 days, THEN 1 tablet (40 mg total) daily. ? ?Medical History:  ?Past Medical History:  ?Diagnosis Date  ? Allergic rhinitis   ? Anemia   ? Aortic atherosclerosis 05/26/2018  ? Barrett's esophagus with dysplasia 04/07/2019  ? CKD (chronic kidney disease), stage III ([place]) 02/28/2014  ? Complication of anesthesia   ? Degenerative arthritis of lumbar spine 05/26/2018  ? Evaluation by Dr. Ernestina Patches in 9/19. A/P: Chronic history of low back pain and radicular pain status post lumbar discectomy and laminectomy by Dr. Melina Schools in 2016.  No recent MRI imaging but no real red flag complaints at this point other than she is getting pain in the low back that is problematic over time it just has her at this point where she is limited in certain things she can do in its   ? Dementia ([place])   ? Diaphragmatic hernia 06/26/2007  ? Diverticulosis   ? GERD (gastroesophageal reflux disease) 06/26/2007  ? Hiatal hernia   ? History of  bronchitis   ? History of cancer of lung 2006  ? 2006 20% of left lung resected. Former smoker. Dr. Lenna Gilford was doing CXR yearly. 09/2017 IMPRESSION: No interval change.  No acute cardiopulmonary findings. Postsurgical change LEFT lung base.  ? Hyperlipidemia 09/25/2011  ? IBS (irritable bowel syndrome)   ? Impacted cerumen of both ears   ? Insomnia   ? Insulin resistance 05/31/2018  ? Irritable bowel syndrome 06/26/2007  ? Major depression disorder in remission   ? Major neurocognitive disorder ([place]) 07/30/2019  ? Osteoarthritis   ? Osteopenia   ? PONV (postoperative nausea and vomiting)   ? Pre-diabetes   ? Primary osteoarthritis of both first carpometacarpal joints 12/28/2015  ? Psoriasis   ? Pure hypercholesterolemia   ? Situational anxiety 11/06/2016  ? Thoracic  lymphadenopathy 09/25/2011  ? Vitamin D deficiency   ? ?Allergies:  ?Allergies  ?Allergen Reactions  ? Ciprocin-Fluocin-Procin [Fluocinolone] Other (See Comments)  ?  Leg and arm numbness  ? Ciprofloxacin Ot

## 2021-10-11 ENCOUNTER — Ambulatory Visit: Payer: Medicare Other | Admitting: Nurse Practitioner

## 2021-10-11 ENCOUNTER — Encounter: Payer: Self-pay | Admitting: Nurse Practitioner

## 2021-10-11 VITALS — BP 160/70 | HR 78 | Temp 98.4°F | Ht 63.5 in | Wt 158.4 lb

## 2021-10-11 DIAGNOSIS — R053 Chronic cough: Secondary | ICD-10-CM | POA: Diagnosis not present

## 2021-10-11 DIAGNOSIS — K219 Gastro-esophageal reflux disease without esophagitis: Secondary | ICD-10-CM | POA: Diagnosis not present

## 2021-10-11 MED ORDER — FLUTICASONE PROPIONATE 50 MCG/ACT NA SUSP: 2.0000 | Freq: Every day | NASAL | 2 refills | Status: DC

## 2021-10-11 NOTE — Assessment & Plan Note (Signed)
Well-controlled on PPI therapy. ?

## 2021-10-11 NOTE — Patient Instructions (Addendum)
Continue protonix 40 mg daily  ? ?Flonase nasal spray 2 sprays each nostril  ?Claritin 10 mg daily  ?Mucinex 600 mg Twice daily for chest congestion/cough ?Flutter valve 2-3 times a day  ? ?Follow up in 3 months with Dr. Lamonte Sakai or Alanson Aly. If symptoms do not improve or worsen, please contact office for sooner follow up or seek emergency care. ?

## 2021-10-11 NOTE — Assessment & Plan Note (Addendum)
Minimal tree-in-bud opacities in right middle lung.  Suspect likely mucoid impaction given symptoms and chronic cough.  Advised mucociliary clearance therapies.  There appears to be a upper airway component to her cough as well.  We will target postnasal drainage control with intranasal steroid.  Advised her to start Claritin for trigger prevention.  Her family will be able to help manage her medicines.  Continue PPI therapy as prescribed by GI.  Discussed that if there is no improvement in her cough and she wishes to undergo further testing, we could obtain pulmonary function testing.  Can discuss this more at follow-up. ? ?Patient Instructions  ?Continue protonix 40 mg daily  ? ?Flonase nasal spray 2 sprays each nostril  ?Claritin 10 mg daily  ?Mucinex 600 mg Twice daily for chest congestion/cough ?Flutter valve 2-3 times a day  ? ?Follow up in 3 months with Dr. Lamonte Sakai or Alanson Aly. If symptoms do not improve or worsen, please contact office for sooner follow up or seek emergency care. ? ? ?

## 2021-10-11 NOTE — Progress Notes (Signed)
? ?@Patient  ID: Krystal Craig, female    DOB: 10/27/1932, 88 y.o.   MRN: [id number] ? ?Chief Complaint  ?Patient presents with  ? Follow-up  ?  ER on 4/14, had a CT scan, saw PCP, told she had a lung infection, put on an antibiotic.  Cough.  Sleeping more than normal for her.  ? ? ?Referring provider: ?Krystal Craig, [place] ? ?HPI: ?88 year old female, former smoker (30 pack years) followed for chronic cough.  She is a patient Dr. Agustina Caroli and last seen in office on 03/15/2020.  She has a history of adenocarcinoma and is status post left lower lobectomy in 2006.  Past medical history significant for diaphragmatic hernia, GERD with Barrett's esophagus, CKD stage III, psoriasis, allergic rhinitis, anxiety/depression, dementia. ? ?TEST/EVENTS:  ?09/28/2021 CTA chest abdomen pelvis: The heart is mildly enlarged.  There is no evidence of aortic dissection.  Mild atherosclerosis.  No LAD present.  There are minimal tree-in-bud opacities in the right middle lobe.  There is pleural-parenchymal scarring in the lung apices.  There is also some scarring in the lung base.  There are postsurgical changes in the left lung.  No acute cardiopulmonary process identified.  Changes of chronic pancreatitis are present. ? ?03/15/2020: OV with Dr. Lamonte Sakai.  Seen initially 3 months ago for chronic cough.  Started on omeprazole and Claritin to see if she would have any relief.  Chest x-ray was performed at last visit, showed some hyperinflation but no other acute findings.  Reported at visit that she continued to have cough, sometimes dry and sometimes productive with clear mucus.  Also has a globus sensation.  Did not feel like she had a lot of nasal congestion.  Advised her to check and see if she is taking Claritin and omeprazole that were prescribed last time.  If not, think it is appropriate to trial both of these before committing to another test.  If she is taking them and still having persistent cough, advised to move forward with  pulmonary function testing, CT scan of her chest and even possible bronchoscopy.  Advised to follow-up in a month. ? ?10/11/2021: Today- follow up ?Patient presents today with daughter for overdue follow-up.  She was recently seen in the emergency room for left chest pain and hypertensive urgency.  CT scan of the chest showed mild tree-in-bud opacities in the right middle lobe.  She was treated by her PCP with azithromycin course.  Today, reports that her cough is at her baseline.  Does not seem to bother her.  It is rarely productive with clear sputum.  Does feel like she has some chest congestion and is unable to get it up.  Denies any shortness of breath, wheezing, hemoptysis, anorexia, night sweats or weight loss.  She was previously advised to start on omeprazole and Claritin.  She has been seen by GI since and started on Protonix for GERD with Barrett's esophagus.  She still has not been taking Claritin and does not do any nasal sprays.  Occasionally has some postnasal drainage.  Denies any extreme allergy type symptoms. ? ?Allergies  ?Allergen Reactions  ? Ciprocin-Fluocin-Procin [Fluocinolone] Other (See Comments)  ?  Leg and arm numbness  ? Ciprofloxacin Other (See Comments)  ?  Extreme weakness  ? Codeine Other (See Comments)  ?  Chest pain/tightness  ? Sulfamethoxazole-Trimethoprim Nausea Only  ? Wellbutrin [Bupropion] Rash  ? ? ?Immunization History  ?Administered Date(s) Administered  ? Fluad Quad(high Dose 65+) 03/16/2021  ? Influenza Split 03/18/2011,  05/04/2012, 04/17/2017  ? Influenza Whole 04/02/2006, 03/24/2009, 05/01/2010  ? Influenza, High Dose Seasonal PF 03/25/2016, 03/20/2017, 03/18/2019  ? Influenza,inj,Quad PF,6+ Mos 02/28/2014, 04/05/2015  ? Influenza-Unspecified 03/17/2013, 02/19/2018  ? PFIZER(Purple Top)SARS-COV-2 Vaccination 07/23/2019, 08/17/2019  ? Pneumococcal Conjugate-13 05/02/2014  ? Pneumococcal Polysaccharide-23 05/04/2015  ? Tetanus 02/28/2014  ? Zoster Recombinat (Shingrix)  11/07/2017, 01/08/2018  ? ? ?Past Medical History:  ?Diagnosis Date  ? Allergic rhinitis   ? Anemia   ? Aortic atherosclerosis 05/26/2018  ? Barrett's esophagus with dysplasia 04/07/2019  ? CKD (chronic kidney disease), stage III ([place]) 02/28/2014  ? Complication of anesthesia   ? Degenerative arthritis of lumbar spine 05/26/2018  ? Evaluation by Dr. Ernestina Patches in 9/19. A/P: Chronic history of low back pain and radicular pain status post lumbar discectomy and laminectomy by Dr. Melina Schools in 2016.  No recent MRI imaging but no real red flag complaints at this point other than she is getting pain in the low back that is problematic over time it just has her at this point where she is limited in certain things she can do in its   ? Dementia ([place])   ? Diaphragmatic hernia 06/26/2007  ? Diverticulosis   ? GERD (gastroesophageal reflux disease) 06/26/2007  ? Hiatal hernia   ? History of bronchitis   ? History of cancer of lung 2006  ? 2006 20% of left lung resected. Former smoker. Dr. Lenna Gilford was doing CXR yearly. 09/2017 IMPRESSION: No interval change.  No acute cardiopulmonary findings. Postsurgical change LEFT lung base.  ? Hyperlipidemia 09/25/2011  ? IBS (irritable bowel syndrome)   ? Impacted cerumen of both ears   ? Insomnia   ? Insulin resistance 05/31/2018  ? Irritable bowel syndrome 06/26/2007  ? Major depression disorder in remission   ? Major neurocognitive disorder ([place]) 07/30/2019  ? Osteoarthritis   ? Osteopenia   ? PONV (postoperative nausea and vomiting)   ? Pre-diabetes   ? Primary osteoarthritis of both first carpometacarpal joints 12/28/2015  ? Psoriasis   ? Pure hypercholesterolemia   ? Situational anxiety 11/06/2016  ? Thoracic lymphadenopathy 09/25/2011  ? Vitamin D deficiency   ? ? ?Tobacco History: ?Social History  ? ?Tobacco Use  ?Smoking Status Former  ? Packs/day: 1.00  ? Years: 30.00  ? Pack years: 30.00  ? Types: Cigarettes  ? Quit date: 06/17/1984  ? Years since quitting: 37.3  ?Smokeless Tobacco  Never  ? ?Counseling given: Not Answered ? ? ?Outpatient Medications Prior to Visit  ?Medication Sig Dispense Refill  ? amLODipine (NORVASC) 5 MG tablet Take 1 tablet (5 mg total) by mouth daily. 90 tablet 1  ? atorvastatin (LIPITOR) 20 MG tablet Take 1 tablet (20 mg total) by mouth daily. 90 tablet 1  ? clobetasol ointment (TEMOVATE) 1.61 % Apply 1 application. topically 2 (two) times a week. Thin application on affected vulva. (Patient taking differently: Apply 1 application. topically See admin instructions. Apply a thin application on affected vulva 2 times a week) 30 g 3  ? pantoprazole (PROTONIX) 40 MG tablet Take 1 tablet (40 mg total) by mouth 2 (two) times daily before a meal for 45 days, THEN 1 tablet (40 mg total) daily. 135 tablet 0  ? ibuprofen (ADVIL) 200 MG tablet Take 400 mg by mouth as needed. (Patient not taking: Reported on 10/11/2021)    ? ?No facility-administered medications prior to visit.  ? ? ? ?Review of Systems:  ? ?Constitutional: No weight loss or gain, night sweats, fevers,  chills, fatigue, or lassitude. ?HEENT: No headaches, difficulty swallowing, tooth/dental problems, or sore throat. No sneezing, itching, ear ache, nasal congestion. +occasional postnasal drainage. ?CV:  No chest pain, orthopnea, PND, swelling in lower extremities, anasarca, dizziness, palpitations, syncope ?Resp: +cough (chronic). No shortness of breath with exertion or at rest. No excess mucus or change in color of mucus. No productive or non-productive. No hemoptysis. No wheezing.  No chest wall deformity ?GI:  +occasional heartburn (improved with PPI). No abdominal pain, nausea, vomiting, diarrhea, change in bowel habits, loss of appetite, bloody stools.  ?Skin: No rash, lesions, ulcerations ?MSK:  No joint pain or swelling.  No decreased range of motion.  No back pain. ?Neuro: No dizziness or lightheadedness. +memory impairment (baseline) ?Psych: No depression or anxiety. Mood stable.  ? ? ? ?Physical Exam: ? ?BP  (!) 160/70 (BP Location: Right Arm, Patient Position: Sitting, Cuff Size: Large)   Pulse 78   Temp 98.4 ?F (36.9 ?C) (Oral)   Ht 5' 3.5" (1.613 m)   Wt 158 lb 6.4 oz (71.8 kg)   LMP  (LMP Unknown)   SpO2 94%

## 2021-10-12 NOTE — Addendum Note (Signed)
Addended by: Vanessa Margan on: 10/12/2021 09:04 AM ? ? Modules accepted: Orders ? ?

## 2021-10-19 ENCOUNTER — Ambulatory Visit (INDEPENDENT_AMBULATORY_CARE_PROVIDER_SITE_OTHER): Payer: Medicare Other | Admitting: Physician Assistant

## 2021-10-19 VITALS — BP 144/82 | HR 79 | Temp 98.0°F | Resp 16 | Ht 63.5 in | Wt 156.8 lb

## 2021-10-19 DIAGNOSIS — I10 Essential (primary) hypertension: Secondary | ICD-10-CM | POA: Diagnosis not present

## 2021-10-19 DIAGNOSIS — R051 Acute cough: Secondary | ICD-10-CM

## 2021-10-19 DIAGNOSIS — R110 Nausea: Secondary | ICD-10-CM

## 2021-10-19 NOTE — Progress Notes (Signed)
Krystal Craig is a 88 y.o. female here for a follow up of a pre-existing problem. ? ? ?History of Present Illness:  ? ?Chief Complaint  ?Patient presents with  ? Hypertension  ?  Pt here for bp check, no concerns reports she has been doing well, pt reports headaches have continued but not severe   ? ? ?HPI ? ?Nausea ?Patient had workup and was found to have chronic pancreatitis. We referred her to the gastroenterologist for further evaluation in the last visit. Today, she notes she has seen Vicie Mutters, PA-C.  She was prescribed Protonix 40 mg two times daily and told to stop all NSAIDs. She has been taking her medication as prescribed. No reported side effects. She notes nausea has been getting better. No reported fever. No headaches or dizziness.  ? ?Cough ?Patient had CT scan of the chest which showed mild tree-in-bud opacities in the right middle lobe.  We started her on Azithromycin 250 mg daily in the last visit. She was referred to pulmonologist for further evaluation. Since our last visit, she saw Marland Kitchen. They notes this was mostly likely mucoid impaction. She was started on Claritin and Flonase nasal spray.  She is currently taking Mucinex 600 mg twice daily for her cough. She does have a hx of adenocarcinoma and is status post left lower lobectomy in 2006. Denies any shortness of breath, wheezing, hemoptysis, anorexia, night sweats or weight loss. ? ?HTN ?Currently taking amlodopine 5 mg. At home blood pressure readings are: <150/90 per daughter. Patient denies chest pain, SOB, blurred vision, dizziness, unusual headaches, lower leg swelling. Patient is  compliant with medication. Denies excessive caffeine intake, stimulant usage, excessive alcohol intake, or increase in salt consumption. ? ?BP Readings from Last 3 Encounters:  ?10/19/21 (!) 144/82  ?10/11/21 (!) 160/70  ?10/10/21 138/78  ? ? ? ? ?Past Medical History:  ?Diagnosis Date  ? Allergic rhinitis   ? Anemia   ? Aortic  atherosclerosis 05/26/2018  ? Barrett's esophagus with dysplasia 04/07/2019  ? CKD (chronic kidney disease), stage III ([place]) 02/28/2014  ? Complication of anesthesia   ? Degenerative arthritis of lumbar spine 05/26/2018  ? Evaluation by Dr. Ernestina Patches in 9/19. A/P: Chronic history of low back pain and radicular pain status post lumbar discectomy and laminectomy by Dr. Melina Schools in 2016.  No recent MRI imaging but no real red flag complaints at this point other than she is getting pain in the low back that is problematic over time it just has her at this point where she is limited in certain things she can do in its   ? Dementia ([place])   ? Diaphragmatic hernia 06/26/2007  ? Diverticulosis   ? GERD (gastroesophageal reflux disease) 06/26/2007  ? Hiatal hernia   ? History of bronchitis   ? History of cancer of lung 2006  ? 2006 20% of left lung resected. Former smoker. Dr. Lenna Gilford was doing CXR yearly. 09/2017 IMPRESSION: No interval change.  No acute cardiopulmonary findings. Postsurgical change LEFT lung base.  ? Hyperlipidemia 09/25/2011  ? IBS (irritable bowel syndrome)   ? Impacted cerumen of both ears   ? Insomnia   ? Insulin resistance 05/31/2018  ? Irritable bowel syndrome 06/26/2007  ? Major depression disorder in remission   ? Major neurocognitive disorder ([place]) 07/30/2019  ? Osteoarthritis   ? Osteopenia   ? PONV (postoperative nausea and vomiting)   ? Pre-diabetes   ? Primary osteoarthritis of both first carpometacarpal joints 12/28/2015  ?  Psoriasis   ? Pure hypercholesterolemia   ? Situational anxiety 11/06/2016  ? Thoracic lymphadenopathy 09/25/2011  ? Vitamin D deficiency   ? ?  ?Social History  ? ?Tobacco Use  ? Smoking status: Former  ?  Packs/day: 1.00  ?  Years: 30.00  ?  Pack years: 30.00  ?  Types: Cigarettes  ?  Quit date: 06/17/1984  ?  Years since quitting: 37.3  ? Smokeless tobacco: Never  ?Vaping Use  ? Vaping Use: Never used  ?Substance Use Topics  ? Alcohol use: No  ?  Alcohol/week: 0.0 standard  drinks  ? Drug use: No  ? ? ?Past Surgical History:  ?Procedure Laterality Date  ? ABDOMINAL HYSTERECTOMY  1969  ? APPENDECTOMY    ? bilateral shouler operations for impingement syndromes Bilateral 2001  ? BUNIONECTOMY Left   ? CATARACT EXTRACTION, BILATERAL Bilateral 2012  ? CHOLECYSTECTOMY  1976  ? COLONOSCOPY    ? LAPAROSCOPIC LYSIS INTESTINAL ADHESIONS    ? left lower lobectomy  11/2004  ? via VATS and mini-thoractomy for 1.5cm adenoca of lung by Dr. Arlyce Dice  ? LUMBAR LAMINECTOMY/DECOMPRESSION MICRODISCECTOMY Left 01/05/2015  ? Procedure: L3-4 DECOMPRESSION MICRODISCECTOMY ON LEFT;  Surgeon: Melina Schools, MD;  Location: [facility];  Service: Orthopedics;  Laterality: Left;  left Lumbar 3-4  ? POLYPECTOMY    ? turmor ear Right 09/2013  ? urologic sugery with cystocele/rectocele repairs and sling  03/2007  ? Dr. Terance Hart  ? ? ?Family History  ?Problem Relation Age of Onset  ? Breast cancer Sister 56  ? Memory loss Sister   ? Dementia Mother   ? Heart attack Mother   ? CVA Father   ? Stroke Father   ? Colon cancer Neg Hx   ? Esophageal cancer Neg Hx   ? Pancreatic cancer Neg Hx   ? Rectal cancer Neg Hx   ? Stomach cancer Neg Hx   ? Colon polyps Neg Hx   ? ? ?Allergies  ?Allergen Reactions  ? Ciprocin-Fluocin-Procin [Fluocinolone] Other (See Comments)  ?  Leg and arm numbness  ? Ciprofloxacin Other (See Comments)  ?  Extreme weakness  ? Codeine Other (See Comments)  ?  Chest pain/tightness  ? Sulfamethoxazole-Trimethoprim Nausea Only  ? Wellbutrin [Bupropion] Rash  ? ? ?Current Medications:  ? ?Current Outpatient Medications:  ?  amLODipine (NORVASC) 5 MG tablet, Take 1 tablet (5 mg total) by mouth daily., Disp: 90 tablet, Rfl: 1 ?  atorvastatin (LIPITOR) 20 MG tablet, Take 1 tablet (20 mg total) by mouth daily., Disp: 90 tablet, Rfl: 1 ?  clobetasol ointment (TEMOVATE) 2.87 %, Apply 1 application. topically 2 (two) times a week. Thin application on affected vulva., Disp: 30 g, Rfl: 3 ?  fluticasone (FLONASE) 50 MCG/ACT  nasal spray, Place 2 sprays into both nostrils daily., Disp: 18.2 mL, Rfl: 2 ?  pantoprazole (PROTONIX) 40 MG tablet, Take 1 tablet (40 mg total) by mouth 2 (two) times daily before a meal for 45 days, THEN 1 tablet (40 mg total) daily., Disp: 135 tablet, Rfl: 0  ? ?Review of Systems:  ? ?ROS ?Negative unless otherwise specified per HPI.  ? ?Vitals:  ? ?Vitals:  ? 10/19/21 1316  ?BP: (!) 144/82  ?Pulse: 79  ?Resp: 16  ?Temp: 98 ?F (36.7 ?C)  ?TempSrc: Temporal  ?SpO2: 97%  ?Weight: 156 lb 12.8 oz (71.1 kg)  ?Height: 5' 3.5" (1.613 m)  ?   ?Body mass index is 27.34 kg/m?. ? ?Physical Exam:  ? ?  Physical Exam ?Vitals and nursing note reviewed.  ?Constitutional:   ?   General: She is not in acute distress. ?   Appearance: She is well-developed. She is not ill-appearing or toxic-appearing.  ?Cardiovascular:  ?   Rate and Rhythm: Normal rate and regular rhythm.  ?   Pulses: Normal pulses.  ?   Heart sounds: Normal heart sounds, S1 normal and S2 normal.  ?Pulmonary:  ?   Effort: Pulmonary effort is normal.  ?   Breath sounds: Normal breath sounds.  ?Skin: ?   General: Skin is warm and dry.  ?Neurological:  ?   Mental Status: She is alert.  ?   GCS: GCS eye subscore is 4. GCS verbal subscore is 5. GCS motor subscore is 6.  ?Psychiatric:     ?   Speech: Speech normal.     ?   Behavior: Behavior normal. Behavior is cooperative.  ? ? ?Assessment and Plan:  ? ?Nausea ?Improving ?Mgmt per GI ? ?Acute cough ?Improving ?Mgmt per pulm ? ?Primary hypertension ?Improving ?Continue Norvasc 5 mg daily ?Continue regular BP monitoring ?Follow-up in 3 months, sooner if concerns ? ?I,Savera Zaman,acting as a Education administrator for [employer], PA.,have documented all relevant documentation on the behalf of [name], PA,as directed by  [name], PA while in the presence of [name], [place].  ? ?IInda Coke, PA, have reviewed all documentation for this visit. The documentation on 10/19/21 for the exam, diagnosis, procedures,  and orders are all accurate and complete. ? ? ?[name], PA-C ? ?

## 2021-10-19 NOTE — Patient Instructions (Signed)
It was great to see you! ? ?Keep up the good work! ? ?Follow-up in 3 months! ? ?Take care, ? ?[name] PA-C  ?

## 2021-11-02 ENCOUNTER — Ambulatory Visit (HOSPITAL_COMMUNITY): Payer: Medicare Other | Attending: Cardiology

## 2021-11-02 DIAGNOSIS — I517 Cardiomegaly: Secondary | ICD-10-CM

## 2021-11-02 LAB — ECHOCARDIOGRAM COMPLETE
Area-P 1/2: 3.83 cm2
S' Lateral: 2.7 cm

## 2021-11-19 ENCOUNTER — Other Ambulatory Visit: Payer: Self-pay

## 2021-11-19 DIAGNOSIS — R110 Nausea: Secondary | ICD-10-CM

## 2021-11-19 DIAGNOSIS — K22719 Barrett's esophagus with dysplasia, unspecified: Secondary | ICD-10-CM

## 2021-11-19 DIAGNOSIS — K219 Gastro-esophageal reflux disease without esophagitis: Secondary | ICD-10-CM

## 2021-11-19 MED ORDER — PANTOPRAZOLE SODIUM 40 MG PO TBEC: DELAYED_RELEASE_TABLET | ORAL | 0 refills | Status: DC

## 2021-12-10 ENCOUNTER — Other Ambulatory Visit: Payer: Self-pay | Admitting: Physician Assistant

## 2021-12-11 ENCOUNTER — Ambulatory Visit: Payer: Medicare Other | Admitting: Gastroenterology

## 2021-12-11 ENCOUNTER — Encounter: Payer: Self-pay | Admitting: Gastroenterology

## 2021-12-11 VITALS — BP 146/80 | HR 91 | Ht 63.5 in | Wt 160.0 lb

## 2021-12-11 DIAGNOSIS — K2270 Barrett's esophagus without dysplasia: Secondary | ICD-10-CM | POA: Diagnosis not present

## 2021-12-11 DIAGNOSIS — R110 Nausea: Secondary | ICD-10-CM | POA: Diagnosis not present

## 2021-12-31 ENCOUNTER — Ambulatory Visit (INDEPENDENT_AMBULATORY_CARE_PROVIDER_SITE_OTHER): Payer: Medicare Other

## 2021-12-31 VITALS — BP 158/80 | HR 80 | Temp 98.1°F | Wt 160.2 lb

## 2021-12-31 DIAGNOSIS — Z0000 Encounter for general adult medical examination without abnormal findings: Secondary | ICD-10-CM | POA: Diagnosis not present

## 2021-12-31 DIAGNOSIS — Z789 Other specified health status: Secondary | ICD-10-CM | POA: Diagnosis not present

## 2021-12-31 NOTE — Progress Notes (Signed)
Subjective:   Krystal Craig is a 88 y.o. female who presents for Medicare Annual (Subsequent) preventive examination.and daughter  Krystal Craig   Review of Systems    Cardiac Risk Factors include: advanced age (>73men, >3 women);dyslipidemia     Objective:    Today's Vitals   12/31/21 1321  BP: (!) 158/80  Pulse: 80  Temp: 98.1 F (36.7 C)  SpO2: 95%  Weight: 160 lb 3.2 oz (72.7 kg)   Body mass index is 27.93 kg/m.     12/31/2021    1:21 PM 12/25/2020    1:57 PM 12/13/2019    2:10 PM 05/18/2019   12:57 PM 01/15/2019    4:16 PM 12/08/2018    4:43 PM 10/09/2017    9:44 AM  Advanced Directives  Does Patient Have a Medical Advance Directive? Yes Yes Yes Yes No No Yes  Type of Industrial/product designer of [place];Living will Living will;Healthcare Power of [place] in Chart? Yes - validated most recent copy scanned in chart (See row information) Yes - validated most recent copy scanned in chart (See row information) Yes - validated most recent copy scanned in chart (See row information)      Would patient like information on creating a medical advance directive?     No - Patient declined      Current Medications (verified) Outpatient Encounter Medications as of 12/31/2021  Medication Sig   amLODipine (NORVASC) 5 MG tablet Take 1 tablet (5 mg total) by mouth daily. (Patient taking differently: Take 10 mg by mouth daily.)   atorvastatin (LIPITOR) 20 MG tablet TAKE 1 TABLET BY MOUTH DAILY   clobetasol ointment (TEMOVATE) 7.42 % Apply 1 application. topically 2 (two) times a week. Thin application on affected vulva.   Fexofenadine HCl (MUCINEX ALLERGY PO) Take by mouth.   fluticasone (FLONASE) 50 MCG/ACT nasal spray Place 2 sprays into both nostrils daily.   loratadine (CLARITIN) 10 MG tablet Take 10 mg by mouth daily.   pantoprazole (PROTONIX) 40 MG tablet Take 1 tablet (40 mg  total) by mouth daily for 45 days, THEN 1 tablet (40 mg total) daily.   No facility-administered encounter medications on file as of 12/31/2021.    Allergies (verified) Ciprocin-fluocin-procin [fluocinolone], Ciprofloxacin, Codeine, Sulfamethoxazole-trimethoprim, and Wellbutrin [bupropion]   History: Past Medical History:  Diagnosis Date   Allergic rhinitis    Anemia    Aortic atherosclerosis 05/26/2018   Barrett's esophagus with dysplasia 04/07/2019   CKD (chronic kidney disease), stage III ([place]) 59/56/3875   Complication of anesthesia    Degenerative arthritis of lumbar spine 05/26/2018   Evaluation by Dr. Ernestina Patches in 9/19. A/P: Chronic history of low back pain and radicular pain status post lumbar discectomy and laminectomy by Dr. Melina Schools in 2016.  No recent MRI imaging but no real red flag complaints at this point other than she is getting pain in the low back that is problematic over time it just has her at this point where she is limited in certain things she can do in its    Dementia [facility])    Diaphragmatic hernia 06/26/2007   Diverticulosis    GERD (gastroesophageal reflux disease) 06/26/2007   Hiatal hernia    History of bronchitis    History of cancer of lung 2006   2006 20% of left lung resected. Former smoker. Dr. Lenna Gilford was doing CXR yearly. 09/2017 IMPRESSION: No  interval change.  No acute cardiopulmonary findings. Postsurgical change LEFT lung base.   Hyperlipidemia 09/25/2011   IBS (irritable bowel syndrome)    Impacted cerumen of both ears    Insomnia    Insulin resistance 05/31/2018   Irritable bowel syndrome 06/26/2007   Major depression disorder in remission    Major neurocognitive disorder ([place]) 07/30/2019   Osteoarthritis    Osteopenia    PONV (postoperative nausea and vomiting)    Pre-diabetes    Primary osteoarthritis of both first carpometacarpal joints 12/28/2015   Psoriasis    Pure hypercholesterolemia    Situational anxiety 11/06/2016   Thoracic  lymphadenopathy 09/25/2011   Vitamin D deficiency    Past Surgical History:  Procedure Laterality Date   ABDOMINAL HYSTERECTOMY  1967/09/10   APPENDECTOMY     bilateral shouler operations for impingement syndromes Bilateral 09-10-99   BUNIONECTOMY Left    CATARACT EXTRACTION, BILATERAL Bilateral September 10, 2010   CHOLECYSTECTOMY  1976   COLONOSCOPY     LAPAROSCOPIC LYSIS INTESTINAL ADHESIONS     left lower lobectomy  11/2004   via VATS and mini-thoractomy for 1.5cm adenoca of lung by Dr. Samantha Crimes LAMINECTOMY/DECOMPRESSION MICRODISCECTOMY Left 01/05/2015   Procedure: L3-4 DECOMPRESSION MICRODISCECTOMY ON LEFT;  Surgeon: Melina Schools, MD;  Location: [place];  Service: Orthopedics;  Laterality: Left;  left Lumbar 3-4   POLYPECTOMY     turmor ear Right 09/2013   urologic sugery with cystocele/rectocele repairs and sling  03/2007   Dr. Terance Hart   Family History  Problem Relation Age of Onset   Breast cancer Sister 23   Memory loss Sister    Dementia Mother    Heart attack Mother    CVA Father    Stroke Father    Colon cancer Neg Hx    Esophageal cancer Neg Hx    Pancreatic cancer Neg Hx    Rectal cancer Neg Hx    Stomach cancer Neg Hx    Colon polyps Neg Hx    Social History   Socioeconomic History   Marital status: Married    Spouse name: Not on file   Number of children: 2   Years of education: 13   Highest education level: Some college, no degree  Occupational History   Occupation: Retired  Tobacco Use   Smoking status: Former    Packs/day: 1.00    Years: 30.00    Total pack years: 30.00    Types: Cigarettes    Quit date: 06/17/1984    Years since quitting: 37.5   Smokeless tobacco: Never  Vaping Use   Vaping Use: Never used  Substance and Sexual Activity   Alcohol use: No    Alcohol/week: 0.0 standard drinks of alcohol   Drug use: No   Sexual activity: Not Currently    Birth control/protection: Post-menopausal    Comment: 1st intercourse 26 yo-2 partners  Other Topics  Concern   Not on file  Social History Narrative   Married (2nd marriage; 1st husband died at age 83) married September 09, 1984 58 years in 05/2014. 2 sons from first marriage, 3 from 2nd (step). 15 grandchildren. 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 Hobbies: reading, golf, family time.   [name] is POA   Right handed   Lives in single story home with husband   Social Determinants of Health   Financial Resource Strain: Low Risk  (12/31/2021)   Overall Financial Resource Strain (CARDIA)    Difficulty of Paying Living Expenses: Not hard at all  Food Insecurity: No Food Insecurity (12/31/2021)   Hunger Vital Sign    Worried About Running Out of Food in the Last Year: Never true    Ran Out of Food in the Last Year: Never true  Transportation Needs: No Transportation Needs (12/31/2021)   PRAPARE - Hydrologist (Medical): No    Lack of Transportation (Non-Medical): No  Physical Activity: Inactive (12/31/2021)   Exercise Vital Sign    Days of Exercise per Week: 0 days    Minutes of Exercise per Session: 0 min  Stress: No Stress Concern Present (12/31/2021)   [place]    Feeling of Stress : Not at all  Social Connections: Moderately Integrated (12/31/2021)   Social Connection and Isolation Panel [NHANES]    Frequency of Communication with Friends and Family: More than three times a week    Frequency of Social Gatherings with Friends and Family: Once a week    Attends Religious Services: 1 to 4 times per year    Active Member of [employer] or Organizations: No    Attends Music therapist: Never    Marital Status: Married    Tobacco Counseling Counseling given: Not Answered   Clinical Intake:  Pre-visit preparation completed: Yes  Pain : No/denies pain     BMI - recorded: 27.93 Nutritional Status: BMI 25 -29 Overweight Nutritional Risks: None Diabetes: No  How often do you need to have someone  help you when you read instructions, pamphlets, or other written materials from your doctor or pharmacy?: 1 - Never  Diabetic?no  Interpreter Needed?: No  Information entered by :: Charlott Rakes, LPN   Activities of Daily Living    12/31/2021    1:36 PM  In your present state of health, do you have any difficulty performing the following activities:  Hearing? 1  Comment slight HOH  Vision? 0  Difficulty concentrating or making decisions? 0  Walking or climbing stairs? 0  Dressing or bathing? 0  Doing errands, shopping? 0  Preparing Food and eating ? N  Using the Toilet? N  In the past six months, have you accidently leaked urine? N  Do you have problems with loss of bowel control? N  Managing your Medications? N  Managing your Finances? N  Housekeeping or managing your Housekeeping? N    Patient Care Team: [name], [place] as PCP - General (Physician Assistant) Darleen Crocker, MD as Consulting Physician (Ophthalmology) Noralee Space, MD as Consulting Physician (Pulmonary Disease) Magnus Sinning, MD as Consulting Physician (Physical Medicine and Rehabilitation) Cameron Sprang, MD as Consulting Physician (Neurology)  Indicate any recent Medical Services you may have received from other than [facility] providers in the past year (date may be approximate).     Assessment:   This is a routine wellness examination for [place].  Hearing/Vision screen Hearing Screening - Comments:: Pt stated slight HOH  Vision Screening - Comments:: Pt follow up with a provider in [place]  Dietary issues and exercise activities discussed: Current Exercise Habits: The patient does not participate in regular exercise at present   Goals Addressed             This Visit's Progress    Patient Stated       Stay healthy        Depression Screen    12/31/2021    1:32 PM 10/19/2021    1:20 PM 12/25/2020    1:56 PM  12/13/2019    2:15 PM 03/22/2019   11:03 AM 06/29/2018   10:13 AM  04/27/2018   11:16 AM  PHQ 2/9 Scores  PHQ - 2 Score 0 0 0 0 4 0 0  PHQ- 9 Score     4 0 0    Fall Risk    12/31/2021    1:35 PM 10/19/2021    1:19 PM 12/25/2020    1:59 PM 12/13/2019    2:14 PM 05/18/2019   12:58 PM  [place] in the past year? 0 0 0 0 0  Number falls in past yr: 0 0 0 0   Injury with Fall? 0 0 0 0   Risk for fall due to : Impaired vision No Fall Risks Impaired vision No Fall Risks   Follow up Falls prevention discussed Falls evaluation completed Falls prevention discussed Falls prevention discussed     FALL RISK PREVENTION PERTAINING TO THE HOME:  Any stairs in or around the home? Yes  If so, are there any without handrails? No Home free of loose throw rugs in walkways, pet beds, electrical cords, etc? Yes  Adequate lighting in your home to reduce risk of falls? Yes   ASSISTIVE DEVICES UTILIZED TO PREVENT FALLS:  Life alert? No  Use of a cane, walker or w/c? No  Grab bars in the bathroom? No  Shower chair or bench in shower? No  Elevated toilet seat or a handicapped toilet? No   TIMED UP AND GO:  Was the test performed? Yes .  Length of time to ambulate 10 feet: 10 sec.   Gait steady and fast without use of assistive device  Cognitive Function:        12/31/2021    1:37 PM 12/25/2020    2:04 PM 12/13/2019    2:21 PM  6CIT Screen  What Year? 0 points 0 points 0 points  What month? 0 points 0 points 0 points  What time? 0 points 0 points 0 points  Count back from 20 0 points 0 points 0 points  Months in reverse 0 points 0 points 0 points  Repeat phrase 10 points 0 points 4 points  Total Score 10 points 0 points 4 points    Immunizations Immunization History  Administered Date(s) Administered   Fluad Quad(high Dose 65+) 03/16/2021   Influenza Split 03/18/2011, 05/04/2012, 04/17/2017   Influenza Whole 04/02/2006, 03/24/2009, 05/01/2010   Influenza, High Dose Seasonal PF 03/25/2016, 03/20/2017, 03/18/2019   Influenza,inj,Quad PF,6+ Mos  02/28/2014, 04/05/2015   Influenza-Unspecified 03/17/2013, 02/19/2018   PFIZER(Purple Top)SARS-COV-2 Vaccination 07/23/2019, 08/17/2019, 02/04/2020, 04/12/2021   Pneumococcal Conjugate-13 05/02/2014   Pneumococcal Polysaccharide-23 05/04/2015   Tetanus 02/28/2014   Zoster Recombinat (Shingrix) 11/07/2017, 01/08/2018    TDAP status: Up to date  Flu Vaccine status: Up to date  Pneumococcal vaccine status: Up to date  Covid-19 vaccine status: Completed vaccines  Qualifies for Shingles Vaccine? Yes   Zostavax completed Yes   Shingrix Completed?: Yes  Screening Tests Health Maintenance  Topic Date Due   COVID-19 Vaccine (5 - Pfizer series) 06/07/2021   INFLUENZA VACCINE  01/15/2022   TETANUS/TDAP  02/29/2024   Pneumonia Vaccine 1+ Years old  Completed   DEXA SCAN  Completed   Zoster Vaccines- Shingrix  Completed   HPV VACCINES  Aged Out    Health Maintenance  Health Maintenance Due  Topic Date Due   COVID-19 Vaccine (5 - Pfizer series) 06/07/2021    Colorectal cancer screening: No  longer required.   Mammogram status: Completed 12/29/20. Repeat every year  Bone Density status: Completed 10/04/14. Results reflect: Bone density results: OSTEOPENIA. Repeat every 2 years.   Additional Screening:   Vision Screening: Recommended annual ophthalmology exams for early detection of glaucoma and other disorders of the eye. Is the patient up to date with their annual eye exam?  No  Who is the provider or what is the name of the office in which the patient attends annual eye exams? [facility] provider  If pt is not established with a provider, would they like to be referred to a provider to establish care? No .   Dental Screening: Recommended annual dental exams for proper oral hygiene  Community Resource Referral / Chronic Care Management: CRR required this visit?  yes  CCM required this visit?  No      Plan:     I have personally reviewed and noted the following in  the patient's chart:   Medical and social history Use of alcohol, tobacco or illicit drugs  Current medications and supplements including opioid prescriptions.  Functional ability and status Nutritional status Physical activity Advanced directives List of other physicians Hospitalizations, surgeries, and ER visits in previous 12 months Vitals Screenings to include cognitive, depression, and falls Referrals and appointments  In addition, I have reviewed and discussed with patient certain preventive protocols, quality metrics, and best practice recommendations. A written personalized care plan for preventive services as well as general preventive health recommendations were provided to patient.     Willette Brace, LPN   08/15/3141   Nurse Notes: pt blood pressure was 158/80 and 154/76 at [facility] patient has readings from April until today. Pt will bring back to appt or leave at the desk for review.

## 2021-12-31 NOTE — Patient Instructions (Addendum)
Krystal Craig , Thank you for taking time to come for your Medicare Wellness Visit. I appreciate your ongoing commitment to your health goals. Please review the following plan we discussed and let me know if I can assist you in the future.   Screening recommendations/referrals: Colonoscopy: no longer required  Mammogram: done 12/29/20 repeat every year  Bone Density: done 10/04/14 repeat every 2 years  Recommended yearly ophthalmology/optometry visit for glaucoma screening and checkup Recommended yearly dental visit for hygiene and checkup  Vaccinations: Influenza vaccine: done 03/16/21 repeat every year  Pneumococcal vaccine: Up to date Tdap vaccine: done 04/17/14 repeat every 10 years  Shingles vaccine: completed 5/24, 01/08/18    Covid-19:Completed 2/5, 3/2, 02/04/20 & 04/12/21  Advanced directives: copies in chart   Conditions/risks identified: stay healthy   Next appointment: Follow up in one year for your annual wellness visit    Preventive Care 65 Years and Older, Female Preventive care refers to lifestyle choices and visits with your health care provider that can promote health and wellness. What does preventive care include? A yearly physical exam. This is also called an annual well check. Dental exams once or twice a year. Routine eye exams. Ask your health care provider how often you should have your eyes checked. Personal lifestyle choices, including: Daily care of your teeth and gums. Regular physical activity. Eating a healthy diet. Avoiding tobacco and drug use. Limiting alcohol use. Practicing safe sex. Taking low-dose aspirin every day. Taking vitamin and mineral supplements as recommended by your health care provider. What happens during an annual well check? The services and screenings done by your health care provider during your annual well check will depend on your age, overall health, lifestyle risk factors, and family history of disease. Counseling  Your  health care provider may ask you questions about your: Alcohol use. Tobacco use. Drug use. Emotional well-being. Home and relationship well-being. Sexual activity. Eating habits. History of falls. Memory and ability to understand (cognition). Work and work Statistician. Reproductive health. Screening  You may have the following tests or measurements: Height, weight, and BMI. Blood pressure. Lipid and cholesterol levels. These may be checked every 5 years, or more frequently if you are over 16 years old. Skin check. Lung cancer screening. You may have this screening every year starting at age 33 if you have a 30-pack-year history of smoking and currently smoke or have quit within the past 15 years. Fecal occult blood test (FOBT) of the stool. You may have this test every year starting at age 53. Flexible sigmoidoscopy or colonoscopy. You may have a sigmoidoscopy every 5 years or a colonoscopy every 10 years starting at age 15. Hepatitis C blood test. Hepatitis B blood test. Sexually transmitted disease (STD) testing. Diabetes screening. This is done by checking your blood sugar (glucose) after you have not eaten for a while (fasting). You may have this done every 1-3 years. Bone density scan. This is done to screen for osteoporosis. You may have this done starting at age 8. Mammogram. This may be done every 1-2 years. Talk to your health care provider about how often you should have regular mammograms. Talk with your health care provider about your test results, treatment options, and if necessary, the need for more tests. Vaccines  Your health care provider may recommend certain vaccines, such as: Influenza vaccine. This is recommended every year. Tetanus, diphtheria, and acellular pertussis (Tdap, Td) vaccine. You may need a Td booster every 10 years. Zoster vaccine. You may  need this after age 9. Pneumococcal 13-valent conjugate (PCV13) vaccine. One dose is recommended after age  20. Pneumococcal polysaccharide (PPSV23) vaccine. One dose is recommended after age 40. Talk to your health care provider about which screenings and vaccines you need and how often you need them. This information is not intended to replace advice given to you by your health care provider. Make sure you discuss any questions you have with your health care provider. Document Released: 06/30/2015 Document Revised: 02/21/2016 Document Reviewed: 04/04/2015 Elsevier Interactive Patient Education  2017 [place] Prevention in the Home Falls can cause injuries. They can happen to people of all ages. There are many things you can do to make your home safe and to help prevent falls. What can I do on the outside of my home? Regularly fix the edges of walkways and driveways and fix any cracks. Remove anything that might make you trip as you walk through a door, such as a raised step or threshold. Trim any bushes or trees on the path to your home. Use bright outdoor lighting. Clear any walking paths of anything that might make someone trip, such as rocks or tools. Regularly check to see if handrails are loose or broken. Make sure that both sides of any steps have handrails. Any raised decks and porches should have guardrails on the edges. Have any leaves, snow, or ice cleared regularly. Use sand or salt on walking paths during winter. Clean up any spills in your garage right away. This includes oil or grease spills. What can I do in the bathroom? Use night lights. Install grab bars by the toilet and in the tub and shower. Do not use towel bars as grab bars. Use non-skid mats or decals in the tub or shower. If you need to sit down in the shower, use a plastic, non-slip stool. Keep the floor dry. Clean up any water that spills on the floor as soon as it happens. Remove soap buildup in the tub or shower regularly. Attach bath mats securely with double-sided non-slip rug tape. Do not have throw  rugs and other things on the floor that can make you trip. What can I do in the bedroom? Use night lights. Make sure that you have a light by your bed that is easy to reach. Do not use any sheets or blankets that are too big for your bed. They should not hang down onto the floor. Have a firm chair that has side arms. You can use this for support while you get dressed. Do not have throw rugs and other things on the floor that can make you trip. What can I do in the kitchen? Clean up any spills right away. Avoid walking on wet floors. Keep items that you use a lot in easy-to-reach places. If you need to reach something above you, use a strong step stool that has a grab bar. Keep electrical cords out of the way. Do not use floor polish or wax that makes floors slippery. If you must use wax, use non-skid floor wax. Do not have throw rugs and other things on the floor that can make you trip. What can I do with my stairs? Do not leave any items on the stairs. Make sure that there are handrails on both sides of the stairs and use them. Fix handrails that are broken or loose. Make sure that handrails are as long as the stairways. Check any carpeting to make sure that it is firmly attached  to the stairs. Fix any carpet that is loose or worn. Avoid having throw rugs at the top or bottom of the stairs. If you do have throw rugs, attach them to the floor with carpet tape. Make sure that you have a light switch at the top of the stairs and the bottom of the stairs. If you do not have them, ask someone to add them for you. What else can I do to help prevent falls? Wear shoes that: Do not have high heels. Have rubber bottoms. Are comfortable and fit you well. Are closed at the toe. Do not wear sandals. If you use a stepladder: Make sure that it is fully opened. Do not climb a closed stepladder. Make sure that both sides of the stepladder are locked into place. Ask someone to hold it for you, if  possible. Clearly mark and make sure that you can see: Any grab bars or handrails. First and last steps. Where the edge of each step is. Use tools that help you move around (mobility aids) if they are needed. These include: Canes. Walkers. Scooters. Crutches. Turn on the lights when you go into a dark area. Replace any light bulbs as soon as they burn out. Set up your furniture so you have a clear path. Avoid moving your furniture around. If any of your floors are uneven, fix them. If there are any pets around you, be aware of where they are. Review your medicines with your doctor. Some medicines can make you feel dizzy. This can increase your chance of falling. Ask your doctor what other things that you can do to help prevent falls. This information is not intended to replace advice given to you by your health care provider. Make sure you discuss any questions you have with your health care provider. Document Released: 03/30/2009 Document Revised: 11/09/2015 Document Reviewed: 07/08/2014 Elsevier Interactive Patient Education  2017 [employer].

## 2022-01-02 ENCOUNTER — Telehealth: Payer: Self-pay

## 2022-01-02 NOTE — Telephone Encounter (Signed)
   Telephone encounter was:  Successful.  01/02/2022 Name: Krystal Craig MRN: [id number] DOB: Jan 10, 1933  Krystal Craig is a 88 y.o. year old female who is a primary care patient of [name], [place] . The community resource team was consulted for assistance with  in-home care assistance.  Care guide performed the following interventions: Spoke with patient's daughter Alaysia Lightle about [facility] [place] and [facility],  placed [employer] referral. Verified email address [email or web address] sent list of homecare agencies.  Follow Up Plan:  Care guide will follow up with patient by phone over the next 7 days.  Belma Dyches, AAS Paralegal, [place] [facility]  [address], [place] [address] ??[email or web address]  ?? [phone]   [email or web address]

## 2022-01-07 ENCOUNTER — Telehealth: Payer: Self-pay

## 2022-01-07 NOTE — Telephone Encounter (Signed)
   Telephone encounter was:  Successful.  01/07/2022 Name: Krystal Craig MRN: [id number] DOB: 1933/05/04  Krystal Craig is a 88 y.o. year old female who is a primary care patient of [name], [place] . The community resource team was consulted for assistance with  in home care.  Care guide performed the following interventions: Received phone call from patient's daughter Tangia Pinard, she received the voicemail message from Meredith Pel at [employer].  [name] stated that she will contact Ubaldo Glassing this week and she also received the emailed in home care resources emailed  01/02/22.  Follow Up Plan:  No further follow up planned at this time. The patient has been provided with needed resources.  Marquis Diles, AAS Paralegal, [place] [facility]  [address], [place] [address] ??[email or web address]  ?? [phone]   [email or web address]

## 2022-01-07 NOTE — Telephone Encounter (Signed)
   Telephone encounter was:  Unsuccessful.  01/07/2022 Name: Krystal Craig MRN: [id number] DOB: 10-30-1932  Unsuccessful outbound call made today to assist with:   in home care assistance.  Outreach Attempt:  2nd Attempt  A HIPAA compliant voice message was left requesting a return call.  Instructed patient to call back at [phone]. Left message on voicemail for patient to return my call regarding referral to [facility].   Lanell Dubie, AAS Paralegal, [place] [facility]  [address],  [address] ??[email or web address]  ?? [phone]   [email or web address]

## 2022-01-18 ENCOUNTER — Ambulatory Visit: Payer: Medicare Other | Admitting: Physician Assistant

## 2022-01-18 NOTE — Progress Notes (Incomplete)
Krystal Craig is a 88 y.o. female here for a follow up on HTN.   SCRIBE STATEMENT  History of Present Illness:   No chief complaint on file.   HPI HTN Currently taking Norvasc 5 mg daily. At home blood pressure readings are: ***. Patient denies chest pain, SOB, blurred vision, dizziness, unusual headaches, lower leg swelling. Patient is *** compliant with medication. Denies excessive caffeine intake, stimulant usage, excessive alcohol intake, or increase in salt consumption.  BP Readings from Last 3 Encounters:  12/31/21 (!) 158/80  12/11/21 (!) 146/80  10/19/21 (!) 144/82     Past Medical History:  Diagnosis Date   Allergic rhinitis    Anemia    Aortic atherosclerosis 05/26/2018   Barrett's esophagus with dysplasia 04/07/2019   CKD (chronic kidney disease), stage III ([place]) 40/98/1191   Complication of anesthesia    Degenerative arthritis of lumbar spine 05/26/2018   Evaluation by Dr. Ernestina Patches in 9/19. A/P: Chronic history of low back pain and radicular pain status post lumbar discectomy and laminectomy by Dr. Melina Schools in 2016.  No recent MRI imaging but no real red flag complaints at this point other than she is getting pain in the low back that is problematic over time it just has her at this point where she is limited in certain things she can do in its    Dementia [facility])    Diaphragmatic hernia 06/26/2007   Diverticulosis    GERD (gastroesophageal reflux disease) 06/26/2007   Hiatal hernia    History of bronchitis    History of cancer of lung 2006   2006 20% of left lung resected. Former smoker. Dr. Lenna Gilford was doing CXR yearly. 09/2017 IMPRESSION: No interval change.  No acute cardiopulmonary findings. Postsurgical change LEFT lung base.   Hyperlipidemia 09/25/2011   IBS (irritable bowel syndrome)    Impacted cerumen of both ears    Insomnia    Insulin resistance 05/31/2018   Irritable bowel syndrome 06/26/2007   Major depression disorder in remission    Major  neurocognitive disorder ([place]) 07/30/2019   Osteoarthritis    Osteopenia    PONV (postoperative nausea and vomiting)    Pre-diabetes    Primary osteoarthritis of both first carpometacarpal joints 12/28/2015   Psoriasis    Pure hypercholesterolemia    Situational anxiety 11/06/2016   Thoracic lymphadenopathy 09/25/2011   Vitamin D deficiency      Social History   Tobacco Use   Smoking status: Former    Packs/day: 1.00    Years: 30.00    Total pack years: 30.00    Types: Cigarettes    Quit date: 06/17/1984    Years since quitting: 37.6   Smokeless tobacco: Never  Vaping Use   Vaping Use: Never used  Substance Use Topics   Alcohol use: No    Alcohol/week: 0.0 standard drinks of alcohol   Drug use: No    Past Surgical History:  Procedure Laterality Date   ABDOMINAL HYSTERECTOMY  1969   APPENDECTOMY     bilateral shouler operations for impingement syndromes Bilateral 2001   BUNIONECTOMY Left    CATARACT EXTRACTION, BILATERAL Bilateral 2012   CHOLECYSTECTOMY  1976   COLONOSCOPY     LAPAROSCOPIC LYSIS INTESTINAL ADHESIONS     left lower lobectomy  11/2004   via VATS and mini-thoractomy for 1.5cm adenoca of lung by Dr. Samantha Crimes LAMINECTOMY/DECOMPRESSION MICRODISCECTOMY Left 01/05/2015   Procedure: L3-4 DECOMPRESSION MICRODISCECTOMY ON LEFT;  Surgeon: Melina Schools, MD;  Location: [facility];  Service: Orthopedics;  Laterality: Left;  left Lumbar 3-4   POLYPECTOMY     turmor ear Right 09/2013   urologic sugery with cystocele/rectocele repairs and sling  03/2007   Dr. Terance Hart    Family History  Problem Relation Age of Onset   Breast cancer Sister 37   Memory loss Sister    Dementia Mother    Heart attack Mother    CVA Father    Stroke Father    Colon cancer Neg Hx    Esophageal cancer Neg Hx    Pancreatic cancer Neg Hx    Rectal cancer Neg Hx    Stomach cancer Neg Hx    Colon polyps Neg Hx     Allergies  Allergen Reactions   Ciprocin-Fluocin-Procin  [Fluocinolone] Other (See Comments)    Leg and arm numbness   Ciprofloxacin Other (See Comments)    Extreme weakness   Codeine Other (See Comments)    Chest pain/tightness   Sulfamethoxazole-Trimethoprim Nausea Only   Wellbutrin [Bupropion] Rash    Current Medications:   Current Outpatient Medications:    amLODipine (NORVASC) 5 MG tablet, Take 1 tablet (5 mg total) by mouth daily. (Patient taking differently: Take 10 mg by mouth daily.), Disp: 90 tablet, Rfl: 1   atorvastatin (LIPITOR) 20 MG tablet, TAKE 1 TABLET BY MOUTH DAILY, Disp: 100 tablet, Rfl: 2   clobetasol ointment (TEMOVATE) 5.32 %, Apply 1 application. topically 2 (two) times a week. Thin application on affected vulva., Disp: 30 g, Rfl: 3   Fexofenadine HCl (MUCINEX ALLERGY PO), Take by mouth., Disp: , Rfl:    fluticasone (FLONASE) 50 MCG/ACT nasal spray, Place 2 sprays into both nostrils daily., Disp: 18.2 mL, Rfl: 2   loratadine (CLARITIN) 10 MG tablet, Take 10 mg by mouth daily., Disp: , Rfl:    pantoprazole (PROTONIX) 40 MG tablet, Take 1 tablet (40 mg total) by mouth daily for 45 days, THEN 1 tablet (40 mg total) daily., Disp: 90 tablet, Rfl: 0   Review of Systems:   ROS Negative unless otherwise specified per HPI.   Vitals:   There were no vitals filed for this visit.   There is no height or weight on file to calculate BMI.  Physical Exam:   Physical Exam  Assessment and Plan:   @DIAGLIST @    I,Savera Zaman,acting as a scribe for [employer], PA.,have documented all relevant documentation on the behalf of [name], PA,as directed by  [name], PA while in the presence of [name], [place].   ***  [name], PA-C

## 2022-01-21 ENCOUNTER — Ambulatory Visit (INDEPENDENT_AMBULATORY_CARE_PROVIDER_SITE_OTHER): Payer: Medicare Other | Admitting: Physician Assistant

## 2022-01-21 ENCOUNTER — Encounter: Payer: Self-pay | Admitting: Physician Assistant

## 2022-01-21 VITALS — BP 150/80 | HR 75 | Temp 98.0°F | Ht 63.5 in | Wt 160.4 lb

## 2022-01-21 DIAGNOSIS — I10 Essential (primary) hypertension: Secondary | ICD-10-CM | POA: Diagnosis not present

## 2022-01-21 MED ORDER — AMLODIPINE BESYLATE 10 MG PO TABS: 10.0000 mg | ORAL_TABLET | Freq: Every day | ORAL | 1 refills | Status: DC

## 2022-01-21 NOTE — Patient Instructions (Addendum)
It was great to see you!  You can take amlodipine 5 mg x 2 daily until you run out of this I have sent in amlodipine 10 mg daily for you   Call and ask to speak to Butch [name] or to leave Butch [name] a message or send me a mychart message   Follow-up in 3 months  Take care,  [name] PA-C

## 2022-01-21 NOTE — Progress Notes (Signed)
Krystal Craig is a 88 y.o. female here for a follow up on HTN.  History of Present Illness:   Chief Complaint  Patient presents with   Follow-up    HPI  HTN 3 months follow-up. Currently taking Norvasc 5 mg daily with no complications.  At home blood pressure readings are: checked regularly. States she her BP at home has been ranging in the 169-678'L systolic and 38-10'F diastolic. Patient denies chest pain, SOB, blurred vision, dizziness, unusual headaches, lower leg swelling. Patient is  compliant with medication.Tolerating this well and denies any side effects.  Denies excessive caffeine intake, stimulant usage, excessive alcohol intake, or increase in salt consumption.  BP Readings from Last 3 Encounters:  01/21/22 (!) 170/80  12/31/21 (!) 158/80  12/11/21 (!) 146/80    Home BP Log 186/88-04/17/2023        174/84-04/18/2023 169/78 -10/03/2021 169/91-4/20/2023 149/78-4/21/2023 164/78-4/22/2023 14468-10/07/2021 168/80-4/25/2023 4/30-144/93/2023 166/92-10/18/2021 165/82-10/19/2021  169/84-Oct 21, 2021 148/25-May 8, /2023 129/79-Oct 28, 2021 169/80-May 18, /2023 169/80-May 75,1025 142/83-Mya 21, 2023 162/79- Nov 08, 2021 148/87-Nov 10, 2021 143/79-May 29. 2023 144/82-November 15, 2021  Past Medical History:  Diagnosis Date   Allergic rhinitis    Anemia    Aortic atherosclerosis 05/26/2018   Barrett's esophagus with dysplasia 04/07/2019   CKD (chronic kidney disease), stage III ([place]) 85/27/7824   Complication of anesthesia    Degenerative arthritis of lumbar spine 05/26/2018   Evaluation by Dr. Ernestina Patches in 9/19. A/P: Chronic history of low back pain and radicular pain status post lumbar discectomy and laminectomy by Dr. Melina Schools in 2016.  No recent MRI imaging but no real red flag complaints at this point other than she is getting pain in the low back that is problematic over time it just has her at this point where she is limited in certain things she can do in its     Dementia [facility])    Diaphragmatic hernia 06/26/2007   Diverticulosis    GERD (gastroesophageal reflux disease) 06/26/2007   Hiatal hernia    History of bronchitis    History of cancer of lung 2006   2006 20% of left lung resected. Former smoker. Dr. Lenna Gilford was doing CXR yearly. 09/2017 IMPRESSION: No interval change.  No acute cardiopulmonary findings. Postsurgical change LEFT lung base.   Hyperlipidemia 09/25/2011   IBS (irritable bowel syndrome)    Impacted cerumen of both ears    Insomnia    Insulin resistance 05/31/2018   Irritable bowel syndrome 06/26/2007   Major depression disorder in remission    Major neurocognitive disorder ([place]) 07/30/2019   Osteoarthritis    Osteopenia    PONV (postoperative nausea and vomiting)    Pre-diabetes    Primary osteoarthritis of both first carpometacarpal joints 12/28/2015   Psoriasis    Pure hypercholesterolemia    Situational anxiety 11/06/2016   Thoracic lymphadenopathy 09/25/2011   Vitamin D deficiency      Social History   Tobacco Use   Smoking status: Former    Packs/day: 1.00    Years: 30.00    Total pack years: 30.00    Types: Cigarettes    Quit date: 06/17/1984    Years since quitting: 37.6   Smokeless tobacco: Never  Vaping Use   Vaping Use: Never used  Substance Use Topics   Alcohol use: No    Alcohol/week: 0.0 standard drinks of alcohol   Drug use: No    Past Surgical History:  Procedure Laterality Date   ABDOMINAL HYSTERECTOMY  1969  APPENDECTOMY     bilateral shouler operations for impingement syndromes Bilateral 2001   BUNIONECTOMY Left    CATARACT EXTRACTION, BILATERAL Bilateral 2012   CHOLECYSTECTOMY  1976   COLONOSCOPY     LAPAROSCOPIC LYSIS INTESTINAL ADHESIONS     left lower lobectomy  11/2004   via VATS and mini-thoractomy for 1.5cm adenoca of lung by Dr.  Crimes LAMINECTOMY/DECOMPRESSION MICRODISCECTOMY Left 01/05/2015   Procedure: L3-4 DECOMPRESSION MICRODISCECTOMY ON LEFT;  Surgeon: Melina Schools, MD;  Location: [place];  Service: Orthopedics;  Laterality: Left;  left Lumbar 3-4   POLYPECTOMY     turmor ear Right 09/2013   urologic sugery with cystocele/rectocele repairs and sling  03/2007   Dr. Terance Hart    Family History  Problem Relation Age of Onset   Breast cancer Sister 3   Memory loss Sister    Dementia Mother    Heart attack Mother    CVA Father    Stroke Father    Colon cancer Neg Hx    Esophageal cancer Neg Hx    Pancreatic cancer Neg Hx    Rectal cancer Neg Hx    Stomach cancer Neg Hx    Colon polyps Neg Hx     Allergies  Allergen Reactions   Ciprocin-Fluocin-Procin [Fluocinolone] Other (See Comments)    Leg and arm numbness   Ciprofloxacin Other (See Comments)    Extreme weakness   Codeine Other (See Comments)    Chest pain/tightness   Sulfamethoxazole-Trimethoprim Nausea Only   Wellbutrin [Bupropion] Rash    Current Medications:   Current Outpatient Medications:    amLODipine (NORVASC) 5 MG tablet, Take 1 tablet (5 mg total) by mouth daily. (Patient taking differently: Take 10 mg by mouth daily.), Disp: 90 tablet, Rfl: 1   atorvastatin (LIPITOR) 20 MG tablet, TAKE 1 TABLET BY MOUTH DAILY, Disp: 100 tablet, Rfl: 2   clobetasol ointment (TEMOVATE) 5.63 %, Apply 1 application. topically 2 (two) times a week. Thin application on affected vulva., Disp: 30 g, Rfl: 3   Fexofenadine HCl (MUCINEX ALLERGY PO), Take by mouth., Disp: , Rfl:    fluticasone (FLONASE) 50 MCG/ACT nasal spray, Place 2 sprays into both nostrils daily., Disp: 18.2 mL, Rfl: 2   loratadine (CLARITIN) 10 MG tablet, Take 10 mg by mouth daily., Disp: , Rfl:    pantoprazole (PROTONIX) 40 MG tablet, Take 1 tablet (40 mg total) by mouth daily for 45 days, THEN 1 tablet (40 mg total) daily., Disp: 90 tablet, Rfl: 0   Review of Systems:   ROS Negative unless otherwise specified per HPI.   Vitals:   Vitals:   01/21/22 1350  BP: (!) 170/80  Pulse: 74  Temp: 98 F (36.7 C)  SpO2:  93%  Weight: 160 lb 6.4 oz (72.8 kg)  Height: 5' 3.5" (1.613 m)     Body mass index is 27.97 kg/m.  Physical Exam:   Physical Exam Vitals and nursing note reviewed.  Constitutional:      General: She is not in acute distress.    Appearance: She is well-developed. She is not ill-appearing or toxic-appearing.  Cardiovascular:     Rate and Rhythm: Normal rate and regular rhythm.     Pulses: Normal pulses.     Heart sounds: Normal heart sounds, S1 normal and S2 normal.  Pulmonary:     Effort: Pulmonary effort is normal.     Breath sounds: Normal breath sounds.  Skin:    General: Skin is warm and  dry.  Neurological:     Mental Status: She is alert.     GCS: GCS eye subscore is 4. GCS verbal subscore is 5. GCS motor subscore is 6.  Psychiatric:        Speech: Speech normal.        Behavior: Behavior normal. Behavior is cooperative.     Assessment and Plan:   Primary hypertension Above goal Increase norvasc to 10 mg daily Follow-up in 3 months, sooner if concerns Continue to monitor blood pressure at home No evidence of end-organ damage; she is overall asymptomatic  I,Savera Zaman,acting as a scribe for [employer], PA.,have documented all relevant documentation on the behalf of [name], PA,as directed by  [name], PA while in the presence of [name], [place].   I, [name], [place], have reviewed all documentation for this visit. The documentation on 01/21/22 for the exam, diagnosis, procedures, and orders are all accurate and complete.   [name], PA-C

## 2022-01-22 ENCOUNTER — Ambulatory Visit: Payer: Medicare Other | Admitting: Emergency Medicine

## 2022-03-01 DIAGNOSIS — Z1231 Encounter for screening mammogram for malignant neoplasm of breast: Secondary | ICD-10-CM | POA: Diagnosis not present

## 2022-03-01 LAB — HM MAMMOGRAPHY

## 2022-03-08 ENCOUNTER — Ambulatory Visit (INDEPENDENT_AMBULATORY_CARE_PROVIDER_SITE_OTHER): Payer: Medicare Other | Admitting: Family Medicine

## 2022-03-08 ENCOUNTER — Encounter: Payer: Self-pay | Admitting: Family Medicine

## 2022-03-08 VITALS — BP 132/80 | HR 90 | Temp 97.7°F | Ht 63.5 in | Wt 157.2 lb

## 2022-03-08 DIAGNOSIS — H9193 Unspecified hearing loss, bilateral: Secondary | ICD-10-CM | POA: Diagnosis not present

## 2022-03-08 DIAGNOSIS — I10 Essential (primary) hypertension: Secondary | ICD-10-CM

## 2022-03-08 DIAGNOSIS — H7391 Unspecified disorder of tympanic membrane, right ear: Secondary | ICD-10-CM

## 2022-03-08 HISTORY — DX: Essential (primary) hypertension: I10

## 2022-03-08 NOTE — Patient Instructions (Addendum)
We will call you within two weeks about your referral to ENT (hearing loss on right and abnormal membrane- possible scarring from childhood infections). If you do not hear within 2 weeks, give [name] a call.  -neurological exam reassuring today but if you have worsening symptoms or new symptoms such as  facial or extremity weakness, slurred words or trouble swallowing,  blurry vision or double vision, numbness/tingling in arms or legs, or confusion or word finding difficulties- let us know ASAP or seek care   Mineral oil for ear full of wax Purchase mineral oil from laxative aisle Lay down on your side with ear that is bothering you facing up Use 3-4 drops with a dropper and place in ear for 30 seconds Place cotton swab outside of ear Turn to other side and allow this to drain Repeat 3-4 x a day Return to see [name] if not improving within a few days  Recommended follow up: Return for as needed for new, worsening, persistent symptoms.

## 2022-03-08 NOTE — Progress Notes (Signed)
Phone [phone] In person visit   Subjective:   Krystal Craig is a 88 y.o. year old very pleasant female patient who presents for/with See problem oriented charting Chief Complaint  Patient presents with   Loss of hearing    Pt c/o loss of hearing in bilateral ears, yesterday could not hear anything, but has been able to hear today    Past Medical History-  Patient Active Problem List   Diagnosis Date Noted   Essential hypertension 03/08/2022    Priority: Medium    Psoriasis 02/28/2014    Priority: Medium    Chronic kidney disease, stage 3 unspecified ([place]) 02/28/2014    Priority: Medium    Hyperlipidemia 09/25/2011    Priority: Medium    Insomnia 08/03/2008    Priority: Medium    Microscopic hematuria, with previous evaluation by Urology and determined to be benign 09/12/2014    Priority: Low   Thoracic lymphadenopathy 09/25/2011    Priority: Low   Osteopenia 08/03/2008    Priority: Low   GERD (gastroesophageal reflux disease) 06/26/2007    Priority: Low   Irritable bowel syndrome 06/26/2007    Priority: Low   Acquired absence of lung (part of) 16/12/3708   History of Helicobacter pylori infection 10/01/2021   Major depression single episode, in partial remission ([place]) 10/01/2021   Prediabetes 10/01/2021   Vitamin D deficiency 10/01/2021   Chronic cough 12/08/2019   Major neurocognitive disorder ([place]) 07/30/2019   Barrett's esophagus with dysplasia 04/07/2019   Age-related osteoporosis without current pathological fracture, does not want treatment 05/31/2018   Insulin resistance 05/31/2018   Abnormal findings on diagnostic imaging of other abdominal regions, liver and pancreas 05/31/2018   Aortic atherosclerosis ([place]) 05/26/2018   Degenerative arthritis of lumbar spine 05/26/2018   Major depressive disorder 12/22/2017   Diverticulosis 11/11/2016   Personal history of other malignant neoplasm of bronchus and lung 11/11/2016   Situational anxiety 11/06/2016    Primary osteoarthritis of both first carpometacarpal joints 12/28/2015   Displacement of lumbar intervertebral disc without myelopathy 01/05/2015   HYPERCHOLESTEROLEMIA 06/23/2007    Medications- reviewed and updated Current Outpatient Medications  Medication Sig Dispense Refill   amLODipine (NORVASC) 10 MG tablet Take 1 tablet (10 mg total) by mouth daily. 90 tablet 1   atorvastatin (LIPITOR) 20 MG tablet TAKE 1 TABLET BY MOUTH DAILY 100 tablet 2   clobetasol ointment (TEMOVATE) 6.26 % Apply 1 application. topically 2 (two) times a week. Thin application on affected vulva. 30 g 3   Fexofenadine HCl (MUCINEX ALLERGY PO) Take by mouth.     fluticasone (FLONASE) 50 MCG/ACT nasal spray Place 2 sprays into both nostrils daily. 18.2 mL 2   loratadine (CLARITIN) 10 MG tablet Take 10 mg by mouth daily.     pantoprazole (PROTONIX) 40 MG tablet Take 1 tablet (40 mg total) by mouth daily for 45 days, THEN 1 tablet (40 mg total) daily. 90 tablet 0   No current facility-administered medications for this visit.     Objective:  BP 132/80   Pulse 90   Temp 97.7 F (36.5 C)   Ht 5' 3.5" (1.613 m)   Wt 157 lb 3.2 oz (71.3 kg)   LMP  (LMP Unknown)   SpO2 97%   BMI 27.41 kg/m  Gen: NAD, resting comfortably Moderate cerumen in upper portion of right ear canal but still able to get a clear view of tympanic membrane-largely reassuring other than some scarring on posterior portion/whitish discoloration.  Left tympanic  membrane was completely obstructed by cerumen-able to curette a portion and then after further irrigation able to remove the large ball of wax which provided some relief to the patient but she still had hearing loss-could not hear my fingers rub on either side but was able to hear my voice clearly CV: RRR no murmurs rubs or gallops Lungs: CTAB no crackles, wheeze, rhonchi Skin: warm, dry Neuro: CN II-XII intact other than hearing loss, sensation and reflexes normal throughout, 5/5 muscle  strength in bilateral upper and lower extremities. Normal finger to nose. Normal rapid alternating movements. No pronator drift.  Normal gait.      Assessment and Plan   #Hearing loss S: Patient complains of bilateral hearing loss.  States could not hear at all yesterday but today is able to hear again. Ears still feel full- 5 minutes ago prior ot visit sounded open. Auto insufflation has helped intermittently in past but not yesterday. No issue outside of last 2 weeks.  - a week ago was very similar where couldn't hear at all. Seems to come and go.  -Denies head trauma -No facial or extremity weakness. No slurred words or trouble swallowing. no blurry vision or double vision. No paresthesias. No confusion or word finding difficulties.   A/P: Patient with intermittent severe hearing loss-does have cerumen in both canals but the right was open enough to get a good view of her tympanic membrane without irrigation and instrumentation-has some scarring on this membrane-refer to ENT for their expert opinion - Did have cerumen impaction on the left which was removed with curetting as well as irrigation combination but continued to note hearing loss -Discussed conservative measures for wax removal at home such as mineral oil usage - As noted with bilateral hearing loss even with cerumen removal still think ENT evaluation is warranted -Reassuring neurological exam-doubt stroke especially with ability of the hearing loss to vary so substantially  #hypertension S: medication: Amlodipine 10 mg, Home readings #s: controlled in office- somewhat high at home - often 130s or 150s but can be as low as high 120s BP Readings from Last 3 Encounters:  03/08/22 132/80  01/21/22 (!) 150/80  12/31/21 (!) 158/80  A/P: Blood pressure is elevated at home at times but in the office is very well controlled- we opted to continue current medicines and follow up with PCP   Recommended follow up: Return for as needed for  new, worsening, persistent symptoms. Future Appointments  Date Time Provider [place]  04/24/2022 11:00 AM [name], [place] [facility]  01/13/2023  2:30 PM [facility]    Lab/Order associations:   ICD-10-CM   1. Essential hypertension  I10     2. Bilateral hearing loss, unspecified hearing loss type  H91.93 Ambulatory referral to ENT    3. Abnormal tympanic membrane of right ear  H73.91 Ambulatory referral to ENT      Time Spent: 38 minutes of total time (2:25 PM- 3:03 PM-with approximately 5 minutes of total 38 minutes spent on irrigation and curetting) was spent on the date of the encounter performing the following actions: chart review prior to seeing the patient, obtaining history, performing a medically necessary exam, counseling on the treatment plan as well as options including ENT referral and my reason for why I thought she needed this, placing orders, and documenting in our EHR.    Return precautions advised.  Garret Reddish, MD

## 2022-03-11 ENCOUNTER — Encounter: Payer: Self-pay | Admitting: *Deleted

## 2022-03-11 ENCOUNTER — Encounter: Payer: Self-pay | Admitting: Physician Assistant

## 2022-03-19 ENCOUNTER — Encounter: Payer: Self-pay | Admitting: Physician Assistant

## 2022-03-19 ENCOUNTER — Ambulatory Visit (INDEPENDENT_AMBULATORY_CARE_PROVIDER_SITE_OTHER): Payer: Medicare Other | Admitting: Physician Assistant

## 2022-03-19 VITALS — BP 132/60 | HR 87 | Temp 97.5°F | Ht 63.5 in | Wt 158.5 lb

## 2022-03-19 DIAGNOSIS — K219 Gastro-esophageal reflux disease without esophagitis: Secondary | ICD-10-CM | POA: Diagnosis not present

## 2022-03-19 DIAGNOSIS — I10 Essential (primary) hypertension: Secondary | ICD-10-CM

## 2022-03-19 LAB — BASIC METABOLIC PANEL
BUN: 10 mg/dL (ref 6–23)
CO2: 27 mEq/L (ref 19–32)
Calcium: 9.4 mg/dL (ref 8.4–10.5)
Chloride: 105 mEq/L (ref 96–112)
Creatinine, Ser: 1.04 mg/dL (ref 0.40–1.20)
GFR: 48.53 mL/min — ABNORMAL LOW (ref >60.00)
Glucose, Bld: 86 mg/dL (ref 70–99)
Potassium: 4.2 mEq/L (ref 3.5–5.1)
Sodium: 142 mEq/L (ref 135–145)

## 2022-03-19 MED ORDER — FLUTICASONE PROPIONATE 50 MCG/ACT NA SUSP: 2.0000 | Freq: Every day | NASAL | 2 refills | Status: DC | PRN

## 2022-03-19 MED ORDER — PANTOPRAZOLE SODIUM 40 MG PO TBEC: 40.0000 mg | DELAYED_RELEASE_TABLET | Freq: Every day | ORAL | 1 refills | Status: DC

## 2022-03-19 NOTE — Progress Notes (Signed)
Krystal Craig is a 88 y.o. female here for a follow up of a pre-existing problem.  History of Present Illness:   Chief Complaint  Patient presents with   Hypertension    Pt is still having high blood pressure, Systolic 854-627, diastolic 03-50, one time diastolic was 093.Pt is complaint with medication.   HPI  Patient is here with her daughter.   HTN She records her blood pressure at home, with an average of 156/80. Currently taking amlodopine 10 mg daily. However, her blood pressure was 205/102 on 03/13/2022, due to missing her blood pressure medication. Her daughter is here with concern of her blood pressure. Denies excessive caffeine intake, stimulant usage, excessive alcohol intake, or increase in salt consumption.  BP Readings from Last 3 Encounters:  03/19/22 132/60  03/08/22 132/80  01/21/22 (!) 150/80    GERD Taking protonix 40 mg daily. Tolerating well. Needs refill. Remains off NSAIDs.    Past Medical History:  Diagnosis Date   Allergic rhinitis    Anemia    Aortic atherosclerosis 05/26/2018   Barrett's esophagus with dysplasia 04/07/2019   CKD (chronic kidney disease), stage III ([place]) 81/82/9937   Complication of anesthesia    Degenerative arthritis of lumbar spine 05/26/2018   Evaluation by Dr. Ernestina Patches in 9/19. A/P: Chronic history of low back pain and radicular pain status post lumbar discectomy and laminectomy by Dr. Melina Schools in 2016.  No recent MRI imaging but no real red flag complaints at this point other than she is getting pain in the low back that is problematic over time it just has her at this point where she is limited in certain things she can do in its    Dementia [facility])    Diaphragmatic hernia 06/26/2007   Diverticulosis    GERD (gastroesophageal reflux disease) 06/26/2007   Hiatal hernia    History of bronchitis    History of cancer of lung 2006   2006 20% of left lung resected. Former smoker. Dr. Lenna Gilford was doing CXR yearly. 09/2017  IMPRESSION: No interval change.  No acute cardiopulmonary findings. Postsurgical change LEFT lung base.   Hyperlipidemia 09/25/2011   IBS (irritable bowel syndrome)    Impacted cerumen of both ears    Insomnia    Insulin resistance 05/31/2018   Irritable bowel syndrome 06/26/2007   Major depression disorder in remission    Major neurocognitive disorder ([place]) 07/30/2019   Osteoarthritis    Osteopenia    PONV (postoperative nausea and vomiting)    Pre-diabetes    Primary osteoarthritis of both first carpometacarpal joints 12/28/2015   Psoriasis    Pure hypercholesterolemia    Situational anxiety 11/06/2016   Thoracic lymphadenopathy 09/25/2011   Vitamin D deficiency      Social History   Tobacco Use   Smoking status: Former    Packs/day: 1.00    Years: 30.00    Total pack years: 30.00    Types: Cigarettes    Quit date: 06/17/1984    Years since quitting: 37.7   Smokeless tobacco: Never  Vaping Use   Vaping Use: Never used  Substance Use Topics   Alcohol use: No    Alcohol/week: 0.0 standard drinks of alcohol   Drug use: No    Past Surgical History:  Procedure Laterality Date   ABDOMINAL HYSTERECTOMY  1969   APPENDECTOMY     bilateral shouler operations for impingement syndromes Bilateral 2001   BUNIONECTOMY Left    CATARACT EXTRACTION, BILATERAL Bilateral 2012  CHOLECYSTECTOMY  1976   COLONOSCOPY     LAPAROSCOPIC LYSIS INTESTINAL ADHESIONS     left lower lobectomy  11/2004   via VATS and mini-thoractomy for 1.5cm adenoca of lung by Dr.  Crimes LAMINECTOMY/DECOMPRESSION MICRODISCECTOMY Left 01/05/2015   Procedure: L3-4 DECOMPRESSION MICRODISCECTOMY ON LEFT;  Surgeon: Melina Schools, MD;  Location: [facility];  Service: Orthopedics;  Laterality: Left;  left Lumbar 3-4   POLYPECTOMY     turmor ear Right 09/2013   urologic sugery with cystocele/rectocele repairs and sling  03/2007   Dr. Terance Hart    Family History  Problem Relation Age of Onset   Breast cancer  Sister 57   Memory loss Sister    Dementia Mother    Heart attack Mother    CVA Father    Stroke Father    Colon cancer Neg Hx    Esophageal cancer Neg Hx    Pancreatic cancer Neg Hx    Rectal cancer Neg Hx    Stomach cancer Neg Hx    Colon polyps Neg Hx     Allergies  Allergen Reactions   Ciprocin-Fluocin-Procin [Fluocinolone] Other (See Comments)    Leg and arm numbness   Ciprofloxacin Other (See Comments)    Extreme weakness   Codeine Other (See Comments)    Chest pain/tightness   Sulfamethoxazole-Trimethoprim Nausea Only   Wellbutrin [Bupropion] Rash    Current Medications:   Current Outpatient Medications:    amLODipine (NORVASC) 10 MG tablet, Take 1 tablet (10 mg total) by mouth daily., Disp: 90 tablet, Rfl: 1   atorvastatin (LIPITOR) 20 MG tablet, TAKE 1 TABLET BY MOUTH DAILY, Disp: 100 tablet, Rfl: 2   clobetasol ointment (TEMOVATE) 2.40 %, Apply 1 application. topically 2 (two) times a week. Thin application on affected vulva., Disp: 30 g, Rfl: 3   fluticasone (FLONASE) 50 MCG/ACT nasal spray, Place 2 sprays into both nostrils daily., Disp: 18.2 mL, Rfl: 2   Guaifenesin 1200 MG TB12, Take by mouth in the morning and at bedtime., Disp: , Rfl:    loratadine (CLARITIN) 10 MG tablet, Take 10 mg by mouth daily., Disp: , Rfl:    pantoprazole (PROTONIX) 40 MG tablet, Take 1 tablet (40 mg total) by mouth daily., Disp: 90 tablet, Rfl: 1   Review of Systems:   ROS Negative unless otherwise specified per HPI.  Vitals:   Vitals:   03/19/22 1103  BP: 132/60  Pulse: 87  Temp: (!) 97.5 F (36.4 C)  TempSrc: Temporal  SpO2: 95%  Weight: 158 lb 8 oz (71.9 kg)  Height: 5' 3.5" (1.613 m)     Body mass index is 27.64 kg/m.  Physical Exam:   Physical Exam Vitals and nursing note reviewed.  Constitutional:      General: She is not in acute distress.    Appearance: Normal appearance. She is well-developed. She is not ill-appearing or toxic-appearing.  HENT:      Head: Normocephalic and atraumatic.     Right Ear: External ear normal.     Left Ear: External ear normal.  Eyes:     Extraocular Movements: Extraocular movements intact.     Pupils: Pupils are equal, round, and reactive to light.  Cardiovascular:     Rate and Rhythm: Normal rate and regular rhythm.     Pulses: Normal pulses.     Heart sounds: Normal heart sounds, S1 normal and S2 normal. No murmur heard.    No gallop.  Pulmonary:     Effort:  Pulmonary effort is normal. No respiratory distress.     Breath sounds: Normal breath sounds. No wheezing or rales.  Skin:    General: Skin is warm and dry.  Neurological:     Mental Status: She is alert and oriented to person, place, and time.     GCS: GCS eye subscore is 4. GCS verbal subscore is 5. GCS motor subscore is 6.  Psychiatric:        Speech: Speech normal.        Behavior: Behavior normal. Behavior is cooperative.        Judgment: Judgment normal.     Assessment and Plan:   Essential hypertension Well controlled Currently taking amlodopine 10 mg daily Family is going to have caregiver in to help with ADLs and med admin Follow-up in 3 months  Gastroesophageal reflux disease without esophagitis Well controlled Continue protonix daily Follow-up with GI as directed  I,Param Shah,acting as a scribe for [employer], PA.,have documented all relevant documentation on the behalf of [name], PA,as directed by  [name], PA while in the presence of [name], [place].  I, [name], [place], have reviewed all documentation for this visit. The documentation on 03/19/22 for the exam, diagnosis, procedures, and orders are all accurate and complete.  [name], PA-C

## 2022-03-19 NOTE — Patient Instructions (Addendum)
Ear Nose Throat contact --> please call to schedule at [facility] [place] [address] [place] [address] Phone: [phone] Fax: [phone]   I am unsure if she needs both the [facility] and CLARITIN daily. MUCINEX is to get congestion out of your body (either through nose or chest) CLARITIN is to dry up nasal drip and prevent allergies  Blood pressure goal is 150/90 -If consistently elevated for a few days AND you are sure she is taking her medication daily, call me  Follow-up in 3 months

## 2022-04-24 ENCOUNTER — Ambulatory Visit: Payer: Medicare Other | Admitting: Physician Assistant

## 2022-06-12 ENCOUNTER — Telehealth: Payer: Self-pay | Admitting: Physician Assistant

## 2022-06-12 NOTE — Telephone Encounter (Signed)
Son called wanting to speak with Jeanett Schlein re power of attorney forms. Would like a call back.

## 2022-06-18 NOTE — Telephone Encounter (Signed)
Patient's son Monica Martinez requests to be called at ph# [phone] to discuss status of Power of [place] forms.

## 2022-06-19 ENCOUNTER — Ambulatory Visit: Payer: Medicare Other | Admitting: Physician Assistant

## 2022-06-20 ENCOUNTER — Encounter: Payer: Medicare Other | Admitting: Family

## 2022-06-21 ENCOUNTER — Ambulatory Visit: Payer: Medicare Other | Admitting: Physician Assistant

## 2022-06-24 ENCOUNTER — Telehealth: Payer: Self-pay | Admitting: Physician Assistant

## 2022-06-24 NOTE — Telephone Encounter (Signed)
Caller states: - Patient has been experiencing weakness, cough, and fever of 102   Patient has been transferred to triage.

## 2022-06-25 ENCOUNTER — Telehealth (INDEPENDENT_AMBULATORY_CARE_PROVIDER_SITE_OTHER): Payer: Medicare Other | Admitting: Physician Assistant

## 2022-06-25 ENCOUNTER — Encounter: Payer: Self-pay | Admitting: Physician Assistant

## 2022-06-25 ENCOUNTER — Ambulatory Visit: Payer: Medicare Other | Admitting: Physician Assistant

## 2022-06-25 VITALS — Ht 63.5 in | Wt 158.0 lb

## 2022-06-25 DIAGNOSIS — U071 COVID-19: Secondary | ICD-10-CM

## 2022-06-25 MED ORDER — MOLNUPIRAVIR EUA 200MG CAPSULE: 4.0000 | ORAL_CAPSULE | Freq: Two times a day (BID) | ORAL | 0 refills | Status: AC

## 2022-06-25 NOTE — Progress Notes (Signed)
Virtual Visit via Video   I connected with Krystal Craig on 06/25/22 at  3:20 PM EST by a video enabled telemedicine application and verified that I am speaking with the correct person using two identifiers. Location patient: Home Location provider: [facility], Office Persons participating in the virtual visit: [place], [place]ki (pt's daughter) [name] PA-C, Anselmo Pickler, LPN   I discussed the limitations of evaluation and management by telemedicine and the availability of in person appointments. The patient expressed understanding and agreed to proceed.  I acted as a Education administrator for [employer], PA-C [employer], LPN   Subjective:   HPI:   COVID-19 Positive Symptom onset: Yesterday Travel/contacts: [place] Dx with COVID 4 days ago  Vaccination status: 3 shots Testing results: Went to [employer] this morning, + for COVID  Patient endorses the following symptoms: fever, cough, chest congestion, body aches.  Patient denies the following symptoms: Denies chest pain, SOB  Treatments tried: Tylenol, Mucinex  Denies hx of asthma/COPD  ROS: See pertinent positives and negatives per HPI.  Patient Active Problem List   Diagnosis Date Noted   Essential hypertension 03/08/2022   Acquired absence of lung (part of) 21/22/4825   History of Helicobacter pylori infection 10/01/2021   Major depression single episode, in partial remission ([place]) 10/01/2021   Prediabetes 10/01/2021   Vitamin D deficiency 10/01/2021   Chronic cough 12/08/2019   Major neurocognitive disorder ([place]) 07/30/2019   Barrett's esophagus with dysplasia 04/07/2019   Age-related osteoporosis without current pathological fracture, does not want treatment 05/31/2018   Insulin resistance 05/31/2018   Abnormal findings on diagnostic imaging of other abdominal regions, liver and pancreas 05/31/2018   Aortic atherosclerosis ([place]) 05/26/2018   Degenerative arthritis of lumbar spine 05/26/2018    Major depressive disorder 12/22/2017   Diverticulosis 11/11/2016   Personal history of other malignant neoplasm of bronchus and lung 11/11/2016   Situational anxiety 11/06/2016   Primary osteoarthritis of both first carpometacarpal joints 12/28/2015   Displacement of lumbar intervertebral disc without myelopathy 01/05/2015   Microscopic hematuria, with previous evaluation by Urology and determined to be benign 09/12/2014   Psoriasis 02/28/2014   Chronic kidney disease, stage 3 unspecified ([place]) 02/28/2014   Thoracic lymphadenopathy 09/25/2011   Hyperlipidemia 09/25/2011   Osteopenia 08/03/2008   Insomnia 08/03/2008   GERD (gastroesophageal reflux disease) 06/26/2007   Irritable bowel syndrome 06/26/2007   HYPERCHOLESTEROLEMIA 06/23/2007    Social History   Tobacco Use   Smoking status: Former    Packs/day: 1.00    Years: 30.00    Total pack years: 30.00    Types: Cigarettes    Quit date: 06/17/1984    Years since quitting: 38.0   Smokeless tobacco: Never  Substance Use Topics   Alcohol use: No    Alcohol/week: 0.0 standard drinks of alcohol    Current Outpatient Medications:    amLODipine (NORVASC) 10 MG tablet, Take 1 tablet (10 mg total) by mouth daily., Disp: 90 tablet, Rfl: 1   clobetasol ointment (TEMOVATE) 0.03 %, Apply 1 application. topically 2 (two) times a week. Thin application on affected vulva., Disp: 30 g, Rfl: 3   Guaifenesin 1200 MG TB12, Take by mouth daily., Disp: , Rfl:    loratadine (CLARITIN) 10 MG tablet, Take 10 mg by mouth daily., Disp: , Rfl:    molnupiravir EUA (LAGEVRIO) 200 mg CAPS capsule, Take 4 capsules (800 mg total) by mouth 2 (two) times daily for 5 days., Disp: 40 capsule, Rfl: 0  pantoprazole (PROTONIX) 40 MG tablet, Take 40 mg by mouth daily., Disp: , Rfl:   Allergies  Allergen Reactions   Ciprocin-Fluocin-Procin [Fluocinolone] Other (See Comments)    Leg and arm numbness   Ciprofloxacin Other (See Comments)    Extreme weakness    Codeine Other (See Comments)    Chest pain/tightness   Sulfamethoxazole-Trimethoprim Nausea Only   Wellbutrin [Bupropion] Rash    Objective:   VITALS: Per patient if applicable, see vitals. GENERAL: Alert, appears well and in no acute distress. HEENT: Atraumatic, conjunctiva clear, no obvious abnormalities on inspection of external nose and ears. NECK: Normal movements of the head and neck. CARDIOPULMONARY: No increased WOB. Speaking in clear sentences. I:E ratio WNL.  MS: Moves all visible extremities without noticeable abnormality. PSYCH: Pleasant and cooperative, well-groomed. Speech normal rate and rhythm. Affect is appropriate. Insight and judgement are appropriate. Attention is focused, linear, and appropriate.  NEURO: CN grossly intact. Oriented as arrived to appointment on time with no prompting. Moves both UE equally.  SKIN: No obvious lesions, wounds, erythema, or cyanosis noted on face or hands.  Assessment and Plan:   [place]ia was seen today for covid positive.  Diagnoses and all orders for this visit:  COVID-19  Other orders -     molnupiravir EUA (LAGEVRIO) 200 mg CAPS capsule; Take 4 capsules (800 mg total) by mouth 2 (two) times daily for 5 days.    No red flags on discussion, patient is not in any obvious distress during our visit. Discussed progression of most viral illnesses, and recommended supportive care at this point in time.   Will prescribe Molnupiravir for her COVID given medication interaction Discussed over the counter supportive care options, including Tylenol 500 mg q 8 hours, with recommendations to push fluids and rest. Reviewed return precautions including new/worsening fever, SOB, new/worsening cough, sudden onset changes of symptoms. Recommended need to self-quarantine and practice social distancing until symptoms resolve. I recommend that patient follow-up if symptoms worsen or persist despite treatment x 7-10 days, sooner if needed.  I  discussed the assessment and treatment plan with the patient. The patient was provided an opportunity to ask questions and all were answered. The patient agreed with the plan and demonstrated an understanding of the instructions.   The patient was advised to call back or seek an in-person evaluation if the symptoms worsen or if the condition fails to improve as anticipated.    [place], [place] 06/25/2022

## 2022-06-25 NOTE — Telephone Encounter (Signed)
Pt scheduled for virtual visit today at 3:20 pm with [facility].

## 2022-06-25 NOTE — Telephone Encounter (Signed)
Pt advised to see PCP within 4 hrs. Will call to follow up.  Patient Name: Krystal Craig Gender: Female DOB: [birth year] Age: 89 Y 2 M 2 D Return Phone Number: [phone] (Primary) Address: City/ State/ Zip: [place]  [address] Client [facility] Client Site [place] [facility] [place] [facility] Provider Morene Rankins, [place]- [place] Contact Type Call Who Is Calling Patient / Member / Family / Caregiver Call Type Triage / Clinical Caller Name Salina April Relationship To Patient Daughter Return Phone Number [phone] (Primary) Chief Complaint Cough Reason for Call Symptomatic / Request for [place] states her mother is having fever 102., Covid negative. Fatigue, deep cough. Translation No Nurse Assessment Nurse: Hassell Done, RN, Joelene Millin Date/Time Eilene Ghazi Time): 06/24/2022 5:20:28 PM Confirm and document reason for call. If symptomatic, describe symptoms. ---caller states her mom has a deep cough, fatigue and a fever of 102.3, oral. covid negative. Does the patient have any new or worsening symptoms? ---Yes Will a triage be completed? ---Yes Related visit to physician within the last 2 weeks? ---No Does the PT have any chronic conditions? (i.e. diabetes, asthma, this includes High risk factors for pregnancy, etc.) ---No Is this a behavioral health or substance abuse call? ---No Guidelines Guideline Title Affirmed Question Affirmed Notes Nurse Date/Time (Eastern Time) Cough - Acute NonProductive [1] Fever > 101 F (38.3 C) AND [2] age > 78 years Alanda Amass 06/24/2022 5:22:59 PM Disp. Time Eilene Ghazi Time) Disposition Final User 06/24/2022 5:03:14 PM Attempt made - no message left Alanda Amass 06/24/2022 5:26:23 PM See HCP within 4 Hours (or PCP triage) Yes Hassell Done, RN, Joelene Millin Final Disposition 06/24/2022 5:26:23 PM See HCP within 4 Hours (or PCP triage) Yes Hassell Done, RN,  Renea Ee Disagree/Comply Disagree Caller Understands Yes PreDisposition Call Doctor Care Advice Given Per Guideline SEE HCP (OR PCP TRIAGE) WITHIN 4 HOURS: * IF OFFICE WILL BE CLOSED AND NO PCP (PRIMARY CARE PROVIDER) SECOND-LEVEL TRIAGE: You need to be seen within the next 3 or 4 hours. A nearby Urgent Care Center [facility]) is often a good source of care. Another choice is to go to the ED. Go sooner if you become worse. * For fevers above 101 F (38.3 C) take either acetaminophen or ibuprofen. FEVER MEDICINES: CALL BACK IF: * You become worse CARE ADVICE given per Cough - Acute Non-Productive (Adult) guideline. Referrals GO TO FACILITY REFUSED

## 2022-06-25 NOTE — Patient Instructions (Addendum)
COVID or suspected COVID home recommendations  For current/suspected COVID symptoms: - Please watch closely for new onset shortness of breath, worsening shortness of breath, dizziness, confusion or any worsening symptoms. If any of these occur, please contact us during business hours, and if after business hours, please seek urgent care or go to the closest emergency room.  -Consider purchasing a pulse oximeter. If your levels are 94% or below persistently, please seek care at the hospital.   -If you test positive for COVID, everyone, regardless of vaccination status, should stay home for 5 days since symptom onset (or if asymptomatic, on day of positive test.) If you have no symptoms or your symptoms are resolving after 5 days, you can leave your house. Continue to wear a mask around others for 5 additional days. If you have a fever, continue to stay home until your fever resolves without use of medication.  -Please inform any contacts of your positive result so they can appropriately quarantine/test.  -Push fluids and try to eat small, frequent meals with protein to maintain your stamina.

## 2022-06-26 ENCOUNTER — Encounter: Payer: Self-pay | Admitting: Family

## 2022-06-26 ENCOUNTER — Ambulatory Visit (INDEPENDENT_AMBULATORY_CARE_PROVIDER_SITE_OTHER): Payer: Medicare Other | Admitting: Family

## 2022-06-26 ENCOUNTER — Ambulatory Visit: Payer: Medicare Other | Admitting: Physician Assistant

## 2022-06-26 VITALS — BP 111/69 | HR 85 | Temp 97.3°F | Ht 63.5 in

## 2022-06-26 DIAGNOSIS — U071 COVID-19: Secondary | ICD-10-CM | POA: Diagnosis not present

## 2022-06-26 NOTE — Progress Notes (Signed)
Patient ID: Krystal Craig, female    DOB: Oct 18, 1932, 89 y.o.   MRN: [id number]  Chief Complaint  Patient presents with   Cough    Pt c/o Cough that's is causing her chest to hurt. Pt is COVID positive and being treated. Symptoms started 3 days ago.    HPI:      Covid sx:  pt positive 3d ago, started Molnupiravir last night, 2nd dose this am, pt dtr in law concerned about her cough and chest starting to hurt. Denies any SOB or difficulty breathing with exertion. Also reports postnasal drip & clearing throat frequently.      Assessment & Plan:  1. COVID-19 - lungs clear, advised most people feel better after 2 days of antiviral. Exudate noted in pharynx, advised on continuing Claritin qd, just started late yesterday. Take Tylenol prn for aches, any fever. Apply heating pad to chest to help with pleuritic CP or heat patches as needed. Call back if fever creeps back up >/= 100 or if sx are not improved after finishing antiviral.  Subjective:    Outpatient Medications Prior to Visit  Medication Sig Dispense Refill   amLODipine (NORVASC) 10 MG tablet Take 1 tablet (10 mg total) by mouth daily. 90 tablet 1   clobetasol ointment (TEMOVATE) 6.62 % Apply 1 application. topically 2 (two) times a week. Thin application on affected vulva. 30 g 3   Guaifenesin 1200 MG TB12 Take by mouth daily.     loratadine (CLARITIN) 10 MG tablet Take 10 mg by mouth daily.     molnupiravir EUA (LAGEVRIO) 200 mg CAPS capsule Take 4 capsules (800 mg total) by mouth 2 (two) times daily for 5 days. 40 capsule 0   pantoprazole (PROTONIX) 40 MG tablet Take 40 mg by mouth daily.     No facility-administered medications prior to visit.   Past Medical History:  Diagnosis Date   Allergic rhinitis    Anemia    Aortic atherosclerosis 05/26/2018   Barrett's esophagus with dysplasia 04/07/2019   CKD (chronic kidney disease), stage III ([place]) 94/76/5465   Complication of anesthesia    Degenerative arthritis of lumbar  spine 05/26/2018   Evaluation by Dr. Ernestina Patches in 9/19. A/P: Chronic history of low back pain and radicular pain status post lumbar discectomy and laminectomy by Dr. Melina Schools in 2016.  No recent MRI imaging but no real red flag complaints at this point other than she is getting pain in the low back that is problematic over time it just has her at this point where she is limited in certain things she can do in its    Dementia [facility])    Diaphragmatic hernia 06/26/2007   Diverticulosis    GERD (gastroesophageal reflux disease) 06/26/2007   Hiatal hernia    History of bronchitis    History of cancer of lung 2006   2006 20% of left lung resected. Former smoker. Dr. Lenna Gilford was doing CXR yearly. 09/2017 IMPRESSION: No interval change.  No acute cardiopulmonary findings. Postsurgical change LEFT lung base.   Hyperlipidemia 09/25/2011   IBS (irritable bowel syndrome)    Impacted cerumen of both ears    Insomnia    Insulin resistance 05/31/2018   Irritable bowel syndrome 06/26/2007   Major depression disorder in remission    Major neurocognitive disorder ([place]) 07/30/2019   Osteoarthritis    Osteopenia    PONV (postoperative nausea and vomiting)    Pre-diabetes    Primary osteoarthritis of both first carpometacarpal joints  12/28/2015   Psoriasis    Pure hypercholesterolemia    Situational anxiety 11/06/2016   Thoracic lymphadenopathy 09/25/2011   Vitamin D deficiency    Past Surgical History:  Procedure Laterality Date   ABDOMINAL HYSTERECTOMY  1969   APPENDECTOMY     bilateral shouler operations for impingement syndromes Bilateral 2001   BUNIONECTOMY Left    CATARACT EXTRACTION, BILATERAL Bilateral 2012   CHOLECYSTECTOMY  1976   COLONOSCOPY     LAPAROSCOPIC LYSIS INTESTINAL ADHESIONS     left lower lobectomy  11/2004   via VATS and mini-thoractomy for 1.5cm adenoca of lung by Dr. Samantha Crimes LAMINECTOMY/DECOMPRESSION MICRODISCECTOMY Left 01/05/2015   Procedure: L3-4 DECOMPRESSION  MICRODISCECTOMY ON LEFT;  Surgeon: Melina Schools, MD;  Location: [place];  Service: Orthopedics;  Laterality: Left;  left Lumbar 3-4   POLYPECTOMY     turmor ear Right 09/2013   urologic sugery with cystocele/rectocele repairs and sling  03/2007   Dr. Terance Hart   Allergies  Allergen Reactions   Ciprocin-Fluocin-Procin [Fluocinolone] Other (See Comments)    Leg and arm numbness   Ciprofloxacin Other (See Comments)    Extreme weakness   Codeine Other (See Comments)    Chest pain/tightness   Sulfamethoxazole-Trimethoprim Nausea Only   Wellbutrin [Bupropion] Rash      Objective:    Physical Exam Vitals and nursing note reviewed.  Constitutional:      Appearance: Normal appearance.  HENT:     Right Ear: Tympanic membrane and ear canal normal.     Left Ear: Tympanic membrane and ear canal normal.     Mouth/Throat:     Mouth: Mucous membranes are moist.     Pharynx: Oropharyngeal exudate and posterior oropharyngeal erythema present. No pharyngeal swelling or uvula swelling.     Tonsils: No tonsillar exudate or tonsillar abscesses.  Cardiovascular:     Rate and Rhythm: Normal rate and regular rhythm.  Pulmonary:     Effort: Pulmonary effort is normal.     Breath sounds: Normal breath sounds.  Musculoskeletal:        General: Normal range of motion.  Lymphadenopathy:     Head:     Right side of head: No preauricular or posterior auricular adenopathy.     Left side of head: No preauricular or posterior auricular adenopathy.     Cervical: No cervical adenopathy.     Upper Body:     Right upper body: No supraclavicular adenopathy.     Left upper body: No supraclavicular adenopathy.  Skin:    General: Skin is warm and dry.  Neurological:     Mental Status: She is alert.  Psychiatric:        Mood and Affect: Mood normal.        Behavior: Behavior normal.    BP 111/69 (BP Location: Right Arm, Patient Position: Sitting, Cuff Size: Large)   Pulse 85   Temp (!) 97.3 F (36.3 C)  (Temporal)   Ht 5' 3.5" (1.613 m)   LMP  (LMP Unknown)   SpO2 95%   BMI 27.55 kg/m  Wt Readings from Last 3 Encounters:  06/25/22 158 lb (71.7 kg)  03/19/22 158 lb 8 oz (71.9 kg)  03/08/22 157 lb 3.2 oz (71.3 kg)      Jeanie Sewer, NP

## 2022-06-30 NOTE — Progress Notes (Signed)
This encounter was created in error - please disregard. Provider was out of office.

## 2022-08-26 ENCOUNTER — Other Ambulatory Visit: Payer: Self-pay | Admitting: Physician Assistant

## 2022-09-02 ENCOUNTER — Other Ambulatory Visit: Payer: Self-pay | Admitting: Physician Assistant

## 2022-10-27 ENCOUNTER — Other Ambulatory Visit: Payer: Self-pay | Admitting: Physician Assistant

## 2022-11-12 ENCOUNTER — Other Ambulatory Visit: Payer: Self-pay | Admitting: Physician Assistant

## 2022-11-28 ENCOUNTER — Other Ambulatory Visit: Payer: Self-pay | Admitting: Physician Assistant

## 2022-12-06 ENCOUNTER — Other Ambulatory Visit: Payer: Self-pay | Admitting: Physician Assistant

## 2022-12-10 ENCOUNTER — Other Ambulatory Visit: Payer: Self-pay | Admitting: Physician Assistant

## 2023-02-05 DIAGNOSIS — M7732 Calcaneal spur, left foot: Secondary | ICD-10-CM | POA: Diagnosis not present

## 2023-02-05 DIAGNOSIS — S99922A Unspecified injury of left foot, initial encounter: Secondary | ICD-10-CM | POA: Diagnosis not present

## 2023-02-05 DIAGNOSIS — M19072 Primary osteoarthritis, left ankle and foot: Secondary | ICD-10-CM | POA: Diagnosis not present

## 2023-02-11 ENCOUNTER — Ambulatory Visit: Payer: Medicare Other | Admitting: Physician Assistant

## 2023-02-14 ENCOUNTER — Encounter: Payer: Self-pay | Admitting: Physician Assistant

## 2023-02-14 ENCOUNTER — Ambulatory Visit: Payer: Medicare Other | Admitting: Physician Assistant

## 2023-02-14 VITALS — BP 164/80 | HR 74 | Temp 97.7°F | Resp 18 | Ht 63.5 in | Wt 158.4 lb

## 2023-02-14 DIAGNOSIS — E88819 Insulin resistance, unspecified: Secondary | ICD-10-CM

## 2023-02-14 DIAGNOSIS — I10 Essential (primary) hypertension: Secondary | ICD-10-CM

## 2023-02-14 DIAGNOSIS — F0390 Unspecified dementia without behavioral disturbance: Secondary | ICD-10-CM

## 2023-02-14 LAB — VITAMIN B12: Vitamin B-12: 123 pg/mL — ABNORMAL LOW (ref 211–911)

## 2023-02-14 LAB — TSH: TSH: 2.13 u[IU]/mL (ref 0.35–5.50)

## 2023-02-14 LAB — LIPID PANEL
Cholesterol: 214 mg/dL — ABNORMAL HIGH (ref 0–200)
HDL: 48.5 mg/dL (ref >39.00)
NonHDL: 165.17
Total CHOL/HDL Ratio: 4
Triglycerides: 251 mg/dL — ABNORMAL HIGH (ref 0.0–149.0)
VLDL: 50.2 mg/dL — ABNORMAL HIGH (ref 0.0–40.0)

## 2023-02-14 LAB — HEMOGLOBIN A1C: Hgb A1c MFr Bld: 6.2 % (ref 4.6–6.5)

## 2023-02-14 LAB — CBC
HCT: 36.5 % (ref 36.0–46.0)
Hemoglobin: 11.7 g/dL — ABNORMAL LOW (ref 12.0–15.0)
MCHC: 32.1 g/dL (ref 30.0–36.0)
MCV: 82.1 fl (ref 78.0–100.0)
Platelets: 350 10*3/uL (ref 150.0–400.0)
RBC: 4.45 Mil/uL (ref 3.87–5.11)
RDW: 15.5 % (ref 11.5–15.5)
WBC: 7.7 10*3/uL (ref 4.0–10.5)

## 2023-02-14 LAB — COMPREHENSIVE METABOLIC PANEL
ALT: 22 U/L (ref 0–35)
AST: 24 U/L (ref 0–37)
Albumin: 3.8 g/dL (ref 3.5–5.2)
Alkaline Phosphatase: 86 U/L (ref 39–117)
BUN: 10 mg/dL (ref 6–23)
CO2: 28 mEq/L (ref 19–32)
Calcium: 9.2 mg/dL (ref 8.4–10.5)
Chloride: 103 mEq/L (ref 96–112)
Creatinine, Ser: 0.94 mg/dL (ref 0.40–1.20)
GFR: 54.44 mL/min — ABNORMAL LOW (ref >60.00)
Glucose, Bld: 86 mg/dL (ref 70–99)
Potassium: 3.6 mEq/L (ref 3.5–5.1)
Sodium: 140 mEq/L (ref 135–145)
Total Bilirubin: 1.1 mg/dL (ref 0.2–1.2)
Total Protein: 6.6 g/dL (ref 6.0–8.3)

## 2023-02-14 LAB — LDL CHOLESTEROL, DIRECT: Direct LDL: 163 mg/dL

## 2023-02-14 NOTE — Progress Notes (Signed)
Krystal Craig is a 89 y.o. female here for a new problem.  History of Present Illness:   Chief Complaint  Patient presents with   Hypertension    Blood pressures have been elevated since February 02, 2023    HPI  HTN Patients' blood pressure has been elevated since 8/18. Patient's daughter reports that the patients husband passed in January 2024 and she is not mentally doing well. The daughter talked about assisted living, but the patient is reluctant and has no desire to go. There are several times she has almost fell to the ground, but someone is usually around to caught her, this may be due to blood pressure. Daughter reports patient takes 10 mg norvasc as regular as possible because she is not always with her mother-in-law. Daughter also reports that a caregiver should now be coming to her house 2 days a week to help her at the house.   Patient reports that she is not well hydrated because she does not like water. She drinks coke, coffee, and unsweetened tea. She getting more nutritious meals and enough sleep.   Major neurocognitive disorder Reviewed neurology notes with patient's daughter in law Apparently, patient's son was taking patient to prior appointments before other family members took over care. Current family caregivers were not aware that patient has seen neurology, had neurocognitive testing, was placed on donepezil or was lost to follow-up  Family is interested in getting her set-up with possible Assisted Living facility that may have a memory unit available for the future.   Past Medical History:  Diagnosis Date   Allergic rhinitis    Anemia    Aortic atherosclerosis 05/26/2018   Barrett's esophagus with dysplasia 04/07/2019   CKD (chronic kidney disease), stage III ([facility]) 02/28/2014   Complication of anesthesia    Degenerative arthritis of lumbar spine 05/26/2018   Evaluation by Dr. Alvester Morin in 9/19. A/P: Chronic history of low back pain and radicular pain status  post lumbar discectomy and laminectomy by Dr. Venita Lick in 2016.  No recent MRI imaging but no real red flag complaints at this point other than she is getting pain in the low back that is problematic over time it just has her at this point where she is limited in certain things she can do in its    Dementia [facility])    Diaphragmatic hernia 06/26/2007   Diverticulosis    GERD (gastroesophageal reflux disease) 06/26/2007   Hiatal hernia    History of bronchitis    History of cancer of lung 2006   2006 20% of left lung resected. Former smoker. Dr. Kriste Basque was doing CXR yearly. 09/2017 IMPRESSION: No interval change.  No acute cardiopulmonary findings. Postsurgical change LEFT lung base.   Hyperlipidemia 09/25/2011   IBS (irritable bowel syndrome)    Impacted cerumen of both ears    Insomnia    Insulin resistance 05/31/2018   Irritable bowel syndrome 06/26/2007   Major depression disorder in remission    Major neurocognitive disorder ([facility]) 07/30/2019   Osteoarthritis    Osteopenia    PONV (postoperative nausea and vomiting)    Pre-diabetes    Primary osteoarthritis of both first carpometacarpal joints 12/28/2015   Psoriasis    Pure hypercholesterolemia    Situational anxiety 11/06/2016   Thoracic lymphadenopathy 09/25/2011   Vitamin D deficiency      Social History   Tobacco Use   Smoking status: Former    Current packs/day: 0.00    Average packs/day: 1 pack/day  for 30.0 years (30.0 ttl pk-yrs)    Types: Cigarettes    Start date: 06/17/1954    Quit date: 06/17/1984    Years since quitting: 38.6   Smokeless tobacco: Never  Vaping Use   Vaping status: Never Used  Substance Use Topics   Alcohol use: No    Alcohol/week: 0.0 standard drinks of alcohol   Drug use: No    Past Surgical History:  Procedure Laterality Date   ABDOMINAL HYSTERECTOMY  1969   APPENDECTOMY     bilateral shouler operations for impingement syndromes Bilateral 2001   BUNIONECTOMY Left    CATARACT  EXTRACTION, BILATERAL Bilateral 2012   CHOLECYSTECTOMY  1976   COLONOSCOPY     LAPAROSCOPIC LYSIS INTESTINAL ADHESIONS     left lower lobectomy  11/2004   via VATS and mini-thoractomy for 1.5cm adenoca of lung by Dr. Kirtland Bouchard LAMINECTOMY/DECOMPRESSION MICRODISCECTOMY Left 01/05/2015   Procedure: L3-4 DECOMPRESSION MICRODISCECTOMY ON LEFT;  Surgeon: Venita Lick, MD;  Location: [facility] OR;  Service: Orthopedics;  Laterality: Left;  left Lumbar 3-4   POLYPECTOMY     turmor ear Right 09/2013   urologic sugery with cystocele/rectocele repairs and sling  03/2007   Dr. Vonita Moss    Family History  Problem Relation Age of Onset   Breast cancer Sister 31   Memory loss Sister    Dementia Mother    Heart attack Mother    CVA Father    Stroke Father    Colon cancer Neg Hx    Esophageal cancer Neg Hx    Pancreatic cancer Neg Hx    Rectal cancer Neg Hx    Stomach cancer Neg Hx    Colon polyps Neg Hx     Allergies  Allergen Reactions   Ciprocin-Fluocin-Procin [Fluocinolone] Other (See Comments)    Leg and arm numbness   Ciprofloxacin Other (See Comments)    Extreme weakness  Other Reaction(s): Extreme weakness   Codeine Other (See Comments)    Chest pain/tightness  Other Reaction(s): Tightness in chest   Sulfamethoxazole-Trimethoprim Nausea Only   Bupropion Rash    Current Medications:   Current Outpatient Medications:    amLODipine (NORVASC) 10 MG tablet, Take 1 tablet by mouth once daily, Disp: 90 tablet, Rfl: 0   clobetasol ointment (TEMOVATE) 0.05 %, Apply 1 application. topically 2 (two) times a week. Thin application on affected vulva., Disp: 30 g, Rfl: 3   Guaifenesin 1200 MG TB12, Take by mouth daily., Disp: , Rfl:    loratadine (CLARITIN) 10 MG tablet, Take 10 mg by mouth daily., Disp: , Rfl:    pantoprazole (PROTONIX) 40 MG tablet, Take 1 tablet by mouth once daily, Disp: 90 tablet, Rfl: 0   Review of Systems:   ROS  Vitals:   Vitals:   02/14/23 1024 02/14/23  1038  BP: (!) 160/80 (!) 164/80  Pulse: 74   Resp: 18   Temp: 97.7 F (36.5 C)   TempSrc: Temporal   SpO2: 95%   Weight: 158 lb 6 oz (71.8 kg)   Height: 5' 3.5" (1.613 m)      Body mass index is 27.61 kg/m.  Physical Exam:   Physical Exam Constitutional:      General: She is not in acute distress.    Appearance: Normal appearance. She is not ill-appearing.  HENT:     Head: Normocephalic and atraumatic.     Right Ear: External ear normal.     Left Ear: External ear normal.  Eyes:  Extraocular Movements: Extraocular movements intact.     Pupils: Pupils are equal, round, and reactive to light.  Cardiovascular:     Rate and Rhythm: Normal rate and regular rhythm.     Heart sounds: Normal heart sounds. No murmur heard.    No gallop.  Pulmonary:     Effort: Pulmonary effort is normal. No respiratory distress.     Breath sounds: Normal breath sounds. No wheezing or rales.  Skin:    General: Skin is warm and dry.  Neurological:     Mental Status: She is alert and oriented to person, place, and time.  Psychiatric:        Judgment: Judgment normal.     Assessment and Plan:   Essential hypertension Above goal today No evidence of end-organ damage on my exam Recommend patient monitor home blood pressure at least a few times weekly Continue amlodipine 10 mg daily Likely add an ARB, however she has not had updated blood work in a while so we will update first and provide recommendations once that has returned If home monitoring shows consistent elevation, or any symptom(s) develop, recommend reach out to [name] for further advice on next steps Follow-up in 1 month, sooner if concerns  Insulin resistance Update A1c and provide recommendations  Major neurocognitive disorder (HCC) I have given patient's family information on patient's prior visits I have also resubmitted referrals for neurocognitive testing and neurology   I,Verona Buck,acting as a scribe for Jarold Motto, PA.,have documented all relevant documentation on the behalf of Jarold Motto, PA,as directed by  Jarold Motto, PA while in the presence of Jarold Motto, [place].  I, Jarold Motto, [place], have reviewed all documentation for this visit. The documentation on 02/14/23 for the exam, diagnosis, procedures, and orders are all accurate and complete.  I spent a total of 66 minutes on this visit, today 02/14/23, which included reviewing previous notes from Neurology and Neuropsych from 2020-2021, ordering tests, discussing plan of care with patient and using shared-decision making on next steps, refilling medications, and documenting the findings in the note.  Jarold Motto, PA-C

## 2023-02-14 NOTE — Patient Instructions (Signed)
It was great to see you!   [facility] [place]rolog[place] Address: [address], [place], [place] [address] Open ? Closes 4:30?PM Phone: [phone]  December 2020 you saw Dr Patrcia Dolly (neurologist). You had blood work and MRI, results were overall normal with age-related changes. You were told to start Donepezil 10mg : take 1/2 tablet daily for 2 weeks, then increase to 1 tablet daily and follow-up with her in 6 months.  February 2021 you saw Dr Rosann Auerbach. You had cognitive testing and were diagnosed with Major Neurocognitive Disorder (formerly "dementia") with recommendations to have repeat testing in 12-18 months.  I will place referrals to get you back in with these providers, however you may also call them.  Continue to monitor your blood pressure   Let's follow-up in 1 month, sooner if you have concerns.  Take care,  Jarold Motto PA-C

## 2023-02-17 ENCOUNTER — Other Ambulatory Visit: Payer: Self-pay | Admitting: Physician Assistant

## 2023-02-17 MED ORDER — AMLODIPINE-OLMESARTAN 10-20 MG PO TABS: 1.0000 | ORAL_TABLET | Freq: Every day | ORAL | 1 refills | Status: DC

## 2023-02-18 ENCOUNTER — Other Ambulatory Visit: Payer: Self-pay | Admitting: *Deleted

## 2023-02-18 ENCOUNTER — Telehealth: Payer: Self-pay | Admitting: Physician Assistant

## 2023-02-18 MED ORDER — SIMVASTATIN 40 MG PO TABS: 40.0000 mg | ORAL_TABLET | Freq: Every day | ORAL | 3 refills | Status: DC

## 2023-02-18 MED ORDER — CYANOCOBALAMIN 1000 MCG/ML IJ SOLN: INTRAMUSCULAR | 5 refills | Status: DC

## 2023-02-18 MED ORDER — "BD LUER-LOK SYRINGE 25G X 1"" 3 ML MISC": 0 refills | Status: DC

## 2023-02-18 NOTE — Telephone Encounter (Signed)
Patient's daughter states they decided to do B12 injections at home since patient has in home nurse that comes to see her. Caller requests anything needed for this be sent to [facility] on [address].

## 2023-02-18 NOTE — Telephone Encounter (Signed)
Rx for Vit B12 and supplies sent to pharmacy as requested.

## 2023-02-24 ENCOUNTER — Encounter: Payer: Self-pay | Admitting: Physician Assistant

## 2023-03-14 ENCOUNTER — Encounter: Payer: Self-pay | Admitting: Physician Assistant

## 2023-03-14 ENCOUNTER — Ambulatory Visit (INDEPENDENT_AMBULATORY_CARE_PROVIDER_SITE_OTHER): Payer: Medicare Other | Admitting: Physician Assistant

## 2023-03-14 VITALS — BP 136/82 | HR 78 | Temp 97.7°F | Ht 63.5 in | Wt 157.5 lb

## 2023-03-14 DIAGNOSIS — I10 Essential (primary) hypertension: Secondary | ICD-10-CM

## 2023-03-14 DIAGNOSIS — R052 Subacute cough: Secondary | ICD-10-CM | POA: Diagnosis not present

## 2023-03-14 DIAGNOSIS — E88819 Insulin resistance, unspecified: Secondary | ICD-10-CM

## 2023-03-14 DIAGNOSIS — T466X5A Adverse effect of antihyperlipidemic and antiarteriosclerotic drugs, initial encounter: Secondary | ICD-10-CM

## 2023-03-14 DIAGNOSIS — M609 Myositis, unspecified: Secondary | ICD-10-CM

## 2023-03-14 DIAGNOSIS — E785 Hyperlipidemia, unspecified: Secondary | ICD-10-CM

## 2023-03-14 NOTE — Patient Instructions (Signed)
It was great to see you!  Stop the simvastatin (zocor)  Continue blood pressure medication as is -- numbers look good!  Consider for the cough (you were on all of these at one point!): -Flonase nasal spray 2 sprays each nostril  -Claritin 10 mg daily  -Mucinex 600 mg Twice daily for chest congestion/cough -Flutter valve 2-3 times a day  -OTC (available over the counter without a prescription) Prilosec for acid reflux  Consider follow-up visit with [facility] -- let me know if you need referral  For your blood sugars creeping up: Consider switching to Glucerna And try to reduce Coke intake Try to use whole wheat bread   Follow-up in 3 months, sooner if concerns!  Take care,  Jarold Motto PA-C

## 2023-03-14 NOTE — Progress Notes (Signed)
Krystal Craig is a 89 y.o. female here for a follow up of a pre-existing problem.  History of Present Illness:   Chief Complaint  Patient presents with   Hypertension    Pt has been checking blood pressure at home. Systolic 135-159, diastolic 65-89    HPI  Daughter-in-law Joyce Gross accompanies patient during this visit.  Hypertension Patient regularly checks her blood pressure at home which averages around 135-159/65-89.  She is compliant with amlodipine-olmesartan 10-20 mg with no adverse side effects. Daughter-in-law states that she has been drinking more water.   Hyperlipidemia  Patients daughter states that the patient has been compliant with 40 mg zocor, but is experiencing adverse side effects with sxs. Daughter states that the patients legs have been hurting and would like to discontinue the medication.    Cough Patient reports a constant dry cough for about 6 months.She has a hx of lung cancer. Denies nasal drip, rhinorrhea, and blowing nose. Last saw [facility] on 10/11/21. Per chart, was told to follow-up in 3 months, this was not done.  At that time, she was on: protonix 40 mg daily  Flonase nasal spray 2 sprays each nostril  Claritin 10 mg daily  Mucinex 600 mg Twice daily for chest congestion/cough Flutter valve 2-3 times a day   She is currently not doing any of this. Daughter-in-law is unable to tell me how long ago its been since she's done this.  Prediabetes Currently not on medication. Does drink 12 bottles of coke a week  Lab Results  Component Value Date   HGBA1C 6.2 02/14/2023     Past Medical History:  Diagnosis Date   Allergic rhinitis    Anemia    Aortic atherosclerosis 05/26/2018   Barrett's esophagus with dysplasia 04/07/2019   CKD (chronic kidney disease), stage III (HCC) 02/28/2014   Complication of anesthesia    Degenerative arthritis of lumbar spine 05/26/2018   Evaluation by Dr. Alvester Morin in 9/19. A/P: Chronic history of low back pain  and radicular pain status post lumbar discectomy and laminectomy by Dr. Venita Lick in 2016.  No recent MRI imaging but no real red flag complaints at this point other than she is getting pain in the low back that is problematic over time it just has her at this point where she is limited in certain things she can do in its    Diaphragmatic hernia 06/26/2007   Diverticulosis    GERD (gastroesophageal reflux disease) 06/26/2007   Hiatal hernia    History of bronchitis    History of cancer of lung 2006   2006 20% of left lung resected. Former smoker. Dr. Kriste Basque was doing CXR yearly. 09/2017 IMPRESSION: No interval change.  No acute cardiopulmonary findings. Postsurgical change LEFT lung base.   Hyperlipidemia 09/25/2011   IBS (irritable bowel syndrome)    Impacted cerumen of both ears    Insomnia    Insulin resistance 05/31/2018   Irritable bowel syndrome 06/26/2007   Major depression disorder in remission    Major neurocognitive disorder (HCC) 07/30/2019   Osteoarthritis    Osteopenia    PONV (postoperative nausea and vomiting)    Primary osteoarthritis of both first carpometacarpal joints 12/28/2015   Psoriasis    Situational anxiety 11/06/2016   Thoracic lymphadenopathy 09/25/2011   Vitamin D deficiency      Social History   Tobacco Use   Smoking status: Former    Current packs/day: 0.00    Average packs/day: 1 pack/day for 30.0 years (  30.0 ttl pk-yrs)    Types: Cigarettes    Start date: 06/17/1954    Quit date: 06/17/1984    Years since quitting: 38.7   Smokeless tobacco: Never  Vaping Use   Vaping status: Never Used  Substance Use Topics   Alcohol use: No    Alcohol/week: 0.0 standard drinks of alcohol   Drug use: No    Past Surgical History:  Procedure Laterality Date   ABDOMINAL HYSTERECTOMY  1969   APPENDECTOMY     bilateral shouler operations for impingement syndromes Bilateral 2001   BUNIONECTOMY Left    CATARACT EXTRACTION, BILATERAL Bilateral 2012    CHOLECYSTECTOMY  1976   COLONOSCOPY     LAPAROSCOPIC LYSIS INTESTINAL ADHESIONS     left lower lobectomy  11/2004   via VATS and mini-thoractomy for 1.5cm adenoca of lung by Dr. Kirtland Bouchard LAMINECTOMY/DECOMPRESSION MICRODISCECTOMY Left 01/05/2015   Procedure: L3-4 DECOMPRESSION MICRODISCECTOMY ON LEFT;  Surgeon: Venita Lick, MD;  Location: [facility] OR;  Service: Orthopedics;  Laterality: Left;  left Lumbar 3-4   POLYPECTOMY     turmor ear Right 09/2013   urologic sugery with cystocele/rectocele repairs and sling  03/2007   Dr. Vonita Moss    Family History  Problem Relation Age of Onset   Breast cancer Sister 49   Memory loss Sister    Dementia Mother    Heart attack Mother    CVA Father    Stroke Father    Colon cancer Neg Hx    Esophageal cancer Neg Hx    Pancreatic cancer Neg Hx    Rectal cancer Neg Hx    Stomach cancer Neg Hx    Colon polyps Neg Hx     Allergies  Allergen Reactions   Ciprocin-Fluocin-Procin [Fluocinolone] Other (See Comments)    Leg and arm numbness   Ciprofloxacin Other (See Comments)    Extreme weakness  Other Reaction(s): Extreme weakness   Codeine Other (See Comments)    Chest pain/tightness  Other Reaction(s): Tightness in chest   Sulfamethoxazole-Trimethoprim Nausea Only   Bupropion Rash    Current Medications:   Current Outpatient Medications:    amlodipine-olmesartan (AZOR) 10-20 MG tablet, Take 1 tablet by mouth daily., Disp: 30 tablet, Rfl: 1   clobetasol ointment (TEMOVATE) 0.05 %, Apply 1 application. topically 2 (two) times a week. Thin application on affected vulva., Disp: 30 g, Rfl: 3   cyanocobalamin (VITAMIN B12) 1000 MCG/ML injection, Inject Vit B 12 into skin intramuscularly once a week x 3 weeks, then once a month x 3 months., Disp: 1 mL, Rfl: 5   simvastatin (ZOCOR) 40 MG tablet, Take 1 tablet (40 mg total) by mouth at bedtime., Disp: 90 tablet, Rfl: 3   SYRINGE-NEEDLE, DISP, 3 ML (B-D 3CC LUER-LOK SYR 25GX1") 25G X 1" 3 ML  MISC, Use to inject Vit B12, Disp: 6 each, Rfl: 0   Guaifenesin 1200 MG TB12, Take by mouth daily. (Patient not taking: Reported on 03/14/2023), Disp: , Rfl:    loratadine (CLARITIN) 10 MG tablet, Take 10 mg by mouth daily. (Patient not taking: Reported on 03/14/2023), Disp: , Rfl:    pantoprazole (PROTONIX) 40 MG tablet, Take 1 tablet by mouth once daily (Patient not taking: Reported on 03/14/2023), Disp: 90 tablet, Rfl: 0   Review of Systems:   ROS  Vitals:   Vitals:   03/14/23 1306 03/14/23 1333  BP: (!) 140/80 136/82  Pulse: 78   Temp: 97.7 F (36.5 C)   TempSrc: Temporal  SpO2: 95%   Weight: 157 lb 8 oz (71.4 kg)   Height: 5' 3.5" (1.613 m)      Body mass index is 27.46 kg/m.  Physical Exam:   Physical Exam Constitutional:      General: She is not in acute distress.    Appearance: Normal appearance. She is not ill-appearing.  HENT:     Head: Normocephalic and atraumatic.     Right Ear: External ear normal.     Left Ear: External ear normal.  Eyes:     Extraocular Movements: Extraocular movements intact.     Pupils: Pupils are equal, round, and reactive to light.  Cardiovascular:     Rate and Rhythm: Normal rate and regular rhythm.     Heart sounds: Normal heart sounds. No murmur heard.    No gallop.  Pulmonary:     Effort: Pulmonary effort is normal. No respiratory distress.     Breath sounds: Normal breath sounds. No wheezing or rales.  Skin:    General: Skin is warm and dry.  Neurological:     Mental Status: She is alert and oriented to person, place, and time.  Psychiatric:        Judgment: Judgment normal.     Assessment and Plan:   Essential hypertension Normotensive Continue amlodipine-olmesartan 10-20 mg daily Follow-up in 3-6 months, sooner if concerns  Insulin resistance Reviewed with patient and daughter-in-law Does not meet criteria for medication at this time Discussed reduction of soda and switching Boost to Glucerna/Low CHO  option Follow-up in 3 months to recheck  Hyperlipidemia, unspecified hyperlipidemia type; Statin-induced myositis Unable to tolerate simvastatin and does not want to take any medication unless absolutely necessary Will discontinue statin at this time  Subacute cough No red flags Lung exam wnl on my exam Advised as follows: Consider for the cough (you were on all of these at one point!): -Flonase nasal spray 2 sprays each nostril  -Claritin 10 mg daily  -Mucinex 600 mg Twice daily for chest congestion/cough -Flutter valve 2-3 times a day  -OTC (available over the counter without a prescription) Prilosec for acid reflux  Consider follow-up visit with [facility] -- let me know if you need referral   I,Verona Buck,acting as a scribe for [employer], PA.,have documented all relevant documentation on the behalf of Jarold Motto, PA,as directed by  Jarold Motto, PA while in the presence of Jarold Motto, [place].  I, Jarold Motto, [place], have reviewed all documentation for this visit. The documentation on 03/14/23 for the exam, diagnosis, procedures, and orders are all accurate and complete.  Jarold Motto, PA-C
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
## 2023-03-18 ENCOUNTER — Encounter: Payer: Self-pay | Admitting: Physician Assistant

## 2023-03-18 ENCOUNTER — Ambulatory Visit: Payer: Medicare Other | Admitting: Physician Assistant

## 2023-03-18 VITALS — BP 121/75 | HR 74 | Resp 18 | Ht 63.5 in | Wt 157.0 lb

## 2023-03-18 DIAGNOSIS — R413 Other amnesia: Secondary | ICD-10-CM | POA: Diagnosis not present

## 2023-03-18 MED ORDER — DONEPEZIL HCL 10 MG PO TABS: ORAL_TABLET | ORAL | 11 refills | Status: DC

## 2023-03-18 NOTE — Patient Instructions (Addendum)
It was a pleasure to see you today at our office.   Recommendations:  Neurocognitive evaluation at our office   MRI of the brain, the radiology office will call you to arrange you appointment   Start Donepezil 10 mg :Take half tablet (5 mg) daily for 2 weeks, then increase to the full tablet at 10 mg daily. Side effects discussed   Follow up in 3 months     For psychiatric meds, mood meds: Please have your primary care physician manage these medications.  If you have any severe symptoms of a stroke, or other severe issues such as confusion,severe chills or fever, etc call 911 or go to the ER as you may need to be evaluated further   For guidance regarding [facility] and if placement were needed at the facility, contact Social Worker tel: [phone]  For assessment of decision of mental capacity and competency:  Call Dr. Erick Blinks, geriatric psychiatrist at [phone]  Counseling regarding caregiver distress, including caregiver depression, anxiety and issues regarding community resources, adult day care programs, adult living facilities, or memory care questions:  please contact your  Primary Doctor's Social Worker   Whom to call: Memory  decline, memory medications: Call our office [phone]    [email or web address]   RECOMMENDATIONS FOR ALL PATIENTS WITH MEMORY PROBLEMS: 1. Continue to exercise (Recommend 30 minutes of walking everyday, or 3 hours every week) 2. Increase social interactions - continue going to [place] and enjoy social gatherings with friends and family 3. Eat healthy, avoid fried foods and eat more fruits and vegetables 4. Maintain adequate blood pressure, blood sugar, and blood cholesterol level. Reducing the risk of stroke and cardiovascular disease also helps promoting better memory. 5. Avoid stressful situations. Live a simple life and avoid aggravations. Organize your time and  prepare for the next day in anticipation. 6. Sleep well, avoid any interruptions of sleep and avoid any distractions in the bedroom that may interfere with adequate sleep quality 7. Avoid sugar, avoid sweets as there is a strong link between excessive sugar intake, diabetes, and cognitive impairment We discussed the Mediterranean diet, which has been shown to help patients reduce the risk of progressive memory disorders and reduces cardiovascular risk. This includes eating fish, eat fruits and green leafy vegetables, nuts like almonds and hazelnuts, walnuts, and also use olive oil. Avoid fast foods and fried foods as much as possible. Avoid sweets and sugar as sugar use has been linked to worsening of memory function.  There is always a concern of gradual progression of memory problems. If this is the case, then we may need to adjust level of care according to patient needs. Support, both to the patient and caregiver, should then be put into place.      You have been referred for a neuropsychological evaluation (i.e., evaluation of memory and thinking abilities). Please bring someone with you to this appointment if possible, as it is helpful for the doctor to hear from both you and another adult who knows you well. Please bring eyeglasses and hearing aids if you wear them.    The evaluation will take approximately 3 hours and has two parts:   The first part is a clinical interview with the neuropsychologist (Dr. Milbert Coulter or Dr. Roseanne Reno). During the interview, the neuropsychologist will speak with you and the individual you brought to the appointment.    The second part of the evaluation is testing with the doctor's technician Annabelle Harman or Selena Batten). During the testing,  the technician will ask you to remember different types of material, solve problems, and answer some questionnaires. Your family member will not be present for this portion of the evaluation.   Please note: We must reserve several hours of the  neuropsychologist's time and the psychometrician's time for your evaluation appointment. As such, there is a No-Show fee of $100. If you are unable to attend any of your appointments, please contact our office as soon as possible to reschedule.      DRIVING: Regarding driving, in patients with progressive memory problems, driving will be impaired. We advise to have someone else do the driving if trouble finding directions or if minor accidents are reported. Independent driving assessment is available to determine safety of driving.   If you are interested in the driving assessment, you can contact the following:  [facility] in [place] [phone]  [facility] [phone]  [facility] [phone]  [facility] [phone] or [phone]   FALL PRECAUTIONS: Be cautious when walking. Scan the area for obstacles that may increase the risk of trips and falls. When getting up in the mornings, sit up at the edge of the bed for a few minutes before getting out of bed. Consider elevating the bed at the head end to avoid drop of blood pressure when getting up. Walk always in a well-lit room (use night lights in the walls). Avoid area rugs or power cords from appliances in the middle of the walkways. Use a walker or a cane if necessary and consider physical therapy for balance exercise. Get your eyesight checked regularly.  FINANCIAL OVERSIGHT: Supervision, especially oversight when making financial decisions or transactions is also recommended.  HOME SAFETY: Consider the safety of the kitchen when operating appliances like stoves, microwave oven, and blender. Consider having supervision and share cooking responsibilities until no longer able to participate in those. Accidents with firearms and other hazards in the house should be identified and addressed as well.   ABILITY TO BE LEFT ALONE: If patient is unable to contact 911 operator, consider  using [employer], or when the need is there, arrange for someone to stay with patients. Smoking is a fire hazard, consider supervision or cessation. Risk of wandering should be assessed by caregiver and if detected at any point, supervision and safe proof recommendations should be instituted.  MEDICATION SUPERVISION: Inability to self-administer medication needs to be constantly addressed. Implement a mechanism to ensure safe administration of the medications.      Mediterranean Diet A Mediterranean diet refers to food and lifestyle choices that are based on the traditions of countries located on the [employer] [place]. This way of eating has been shown to help prevent certain conditions and improve outcomes for people who have chronic diseases, like kidney disease and heart disease. What are tips for following this plan? Lifestyle  Cook and eat meals together with your family, when possible. Drink enough fluid to keep your urine clear or pale yellow. Be physically active every day. This includes: Aerobic exercise like running or swimming. Leisure activities like gardening, walking, or housework. Get 7-8 hours of sleep each night. If recommended by your health care provider, drink red wine in moderation. This means 1 glass a day for nonpregnant women and 2 glasses a day for men. A glass of wine equals 5 oz (150 mL). Reading food labels  Check the serving size of packaged foods. For foods such as rice and pasta, the serving size refers to the amount of  cooked product, not dry. Check the total fat in packaged foods. Avoid foods that have saturated fat or trans fats. Check the ingredients list for added sugars, such as corn syrup. Shopping  At the grocery store, buy most of your food from the areas near the walls of the store. This includes: Fresh fruits and vegetables (produce). Grains, beans, nuts, and seeds. Some of these may be available in unpackaged forms or large amounts (in bulk). Fresh  seafood. Poultry and eggs. Low-fat dairy products. Buy whole ingredients instead of prepackaged foods. Buy fresh fruits and vegetables in-season from local farmers markets. Buy frozen fruits and vegetables in resealable bags. If you do not have access to quality fresh seafood, buy precooked frozen shrimp or canned fish, such as tuna, salmon, or sardines. Buy small amounts of raw or cooked vegetables, salads, or olives from the deli or salad bar at your store. Stock your pantry so you always have certain foods on hand, such as olive oil, canned tuna, canned tomatoes, rice, pasta, and beans. Cooking  Cook foods with extra-virgin olive oil instead of using butter or other vegetable oils. Have meat as a side dish, and have vegetables or grains as your main dish. This means having meat in small portions or adding small amounts of meat to foods like pasta or stew. Use beans or vegetables instead of meat in common dishes like chili or lasagna. Experiment with different cooking methods. Try roasting or broiling vegetables instead of steaming or sauteing them. Add frozen vegetables to soups, stews, pasta, or rice. Add nuts or seeds for added healthy fat at each meal. You can add these to yogurt, salads, or vegetable dishes. Marinate fish or vegetables using olive oil, lemon juice, garlic, and fresh herbs. Meal planning  Plan to eat 1 vegetarian meal one day each week. Try to work up to 2 vegetarian meals, if possible. Eat seafood 2 or more times a week. Have healthy snacks readily available, such as: Vegetable sticks with hummus. Greek yogurt. Fruit and nut trail mix. Eat balanced meals throughout the week. This includes: Fruit: 2-3 servings a day Vegetables: 4-5 servings a day Low-fat dairy: 2 servings a day Fish, poultry, or lean meat: 1 serving a day Beans and legumes: 2 or more servings a week Nuts and seeds: 1-2 servings a day Whole grains: 6-8 servings a day Extra-virgin olive oil: 3-4  servings a day Limit red meat and sweets to only a few servings a month What are my food choices? Mediterranean diet Recommended Grains: Whole-grain pasta. Brown rice. Bulgar wheat. Polenta. Couscous. Whole-wheat bread. Orpah Cobb. Vegetables: Artichokes. Beets. Broccoli. Cabbage. Carrots. Eggplant. Green beans. Chard. Kale. Spinach. Onions. Leeks. Peas. Squash. Tomatoes. Peppers. Radishes. Fruits: Apples. Apricots. Avocado. Berries. Bananas. Cherries. Dates. Figs. Grapes. Lemons. Melon. Oranges. Peaches. Plums. Pomegranate. Meats and other protein foods: Beans. Almonds. Sunflower seeds. Pine nuts. Peanuts. Cod. Salmon. Scallops. Shrimp. Tuna. Tilapia. Clams. Oysters. Eggs. Dairy: Low-fat milk. Cheese. Greek yogurt. Beverages: Water. Red wine. Herbal tea. Fats and oils: Extra virgin olive oil. Avocado oil. Grape seed oil. Sweets and desserts: [place] yogurt with honey. Baked apples. Poached pears. Trail mix. Seasoning and other foods: Basil. Cilantro. Coriander. Cumin. Mint. Parsley. Sage. Rosemary. Tarragon. Garlic. Oregano. Thyme. Pepper. Balsalmic vinegar. Tahini. Hummus. Tomato sauce. Olives. Mushrooms. Limit these Grains: Prepackaged pasta or rice dishes. Prepackaged cereal with added sugar. Vegetables: Deep fried potatoes (french fries). Fruits: Fruit canned in syrup. Meats and other protein foods: Beef. Pork. Lamb. Poultry with skin. Hot dogs.  Tomasa Blase. Dairy: Ice cream. Sour cream. Whole milk. Beverages: Juice. Sugar-sweetened soft drinks. Beer. Liquor and spirits. Fats and oils: Butter. Canola oil. Vegetable oil. Beef fat (tallow). Lard. Sweets and desserts: Cookies. Cakes. Pies. Candy. Seasoning and other foods: Mayonnaise. Premade sauces and marinades. The items listed may not be a complete list. Talk with your dietitian about what dietary choices are right for you. Summary The Mediterranean diet includes both food and lifestyle choices. Eat a variety of fresh fruits and  vegetables, beans, nuts, seeds, and whole grains. Limit the amount of red meat and sweets that you eat. Talk with your health care provider about whether it is safe for you to drink red wine in moderation. This means 1 glass a day for nonpregnant women and 2 glasses a day for men. A glass of wine equals 5 oz (150 mL). This information is not intended to replace advice given to you by your health care provider. Make sure you discuss any questions you have with your health care provider. Document Released: 01/25/2016 Document Revised: 02/27/2016 Document Reviewed: 01/25/2016 Elsevier Interactive Patient Education  2017 [employer].

## 2023-03-19 ENCOUNTER — Other Ambulatory Visit: Payer: Self-pay | Admitting: Physician Assistant

## 2023-04-01 ENCOUNTER — Other Ambulatory Visit: Payer: Self-pay | Admitting: Physician Assistant

## 2023-04-14 ENCOUNTER — Other Ambulatory Visit: Payer: Self-pay | Admitting: Physician Assistant

## 2023-04-21 ENCOUNTER — Ambulatory Visit
Admission: RE | Admit: 2023-04-21 | Discharge: 2023-04-21 | Disposition: A | Discharge status: Home / Self Care | Payer: Medicare Other | Source: Ambulatory Visit | Attending: Physician Assistant | Admitting: Physician Assistant

## 2023-04-21 DIAGNOSIS — G319 Degenerative disease of nervous system, unspecified: Secondary | ICD-10-CM | POA: Diagnosis not present

## 2023-04-21 DIAGNOSIS — R413 Other amnesia: Secondary | ICD-10-CM | POA: Diagnosis not present

## 2023-04-21 DIAGNOSIS — I6782 Cerebral ischemia: Secondary | ICD-10-CM | POA: Diagnosis not present

## 2023-04-21 DIAGNOSIS — R9082 White matter disease, unspecified: Secondary | ICD-10-CM | POA: Diagnosis not present

## 2023-04-22 ENCOUNTER — Other Ambulatory Visit: Payer: Self-pay | Admitting: Physician Assistant

## 2023-04-28 ENCOUNTER — Telehealth: Payer: Self-pay | Admitting: Physician Assistant

## 2023-04-28 NOTE — Telephone Encounter (Signed)
Poa  daughter Joyce Gross notified to stop Donepezil. Thanked me for calling, she will update [name] in one week.Advised to drink some electrolytes, more fluid intake

## 2023-04-28 NOTE — Telephone Encounter (Signed)
Hold donepezil for 1 week and observe. Thanks

## 2023-04-28 NOTE — Telephone Encounter (Signed)
Diarrhea x 3 days, very lethargic. More meaner than other days. Donepezil. Please advise. Has been on since October 1,2024

## 2023-04-28 NOTE — Telephone Encounter (Signed)
Patient daughter has questions about side effects from a medication that Huntley Dec put her on   The medication is Donepezil

## 2023-05-06 ENCOUNTER — Ambulatory Visit (INDEPENDENT_AMBULATORY_CARE_PROVIDER_SITE_OTHER): Payer: Medicare Other

## 2023-05-06 VITALS — Wt 157.0 lb

## 2023-05-06 DIAGNOSIS — Z0000 Encounter for general adult medical examination without abnormal findings: Secondary | ICD-10-CM

## 2023-05-06 NOTE — Progress Notes (Signed)
Subjective:   Krystal Craig is a 89 y.o. female who presents for Medicare Annual (Subsequent) preventive examination.  Visit Complete: Virtual I connected with  Patric Dykes on 05/06/23 by a audio enabled telemedicine application and verified that I am speaking with the correct person using two identifiers.  Patient Location: Home  Provider Location: Office/Clinic  I discussed the limitations of evaluation and management by telemedicine. The patient expressed understanding and agreed to proceed.  Vital Signs: Because this visit was a virtual/telehealth visit, some criteria may be missing or patient reported. Any vitals not documented were not able to be obtained and vitals that have been documented are patient reported.   Cardiac Risk Factors include: advanced age (>46men, >11 women);dyslipidemia;hypertension     Objective:    Today's Vitals   05/06/23 1526  Weight: 157 lb (71.2 kg)   Body mass index is 27.38 kg/m.     05/06/2023    3:31 PM 03/18/2023    1:29 PM 12/31/2021    1:21 PM 12/25/2020    1:57 PM 12/13/2019    2:10 PM 05/18/2019   12:57 PM 01/15/2019    4:16 PM  Advanced Directives  Does Patient Have a Medical Advance Directive? Yes Yes Yes Yes Yes Yes No  Type of Estate agent of [place];Living will Healthcare Power of [employer] Power of [employer] Power of [employer] Power of [place];Living will Living will;Healthcare Power of Attorney   Does patient want to make changes to medical advance directive? No - Patient declined No - Patient declined       Copy of Healthcare Power of Attorney in Chart? Yes - validated most recent copy scanned in chart (See row information) No - copy requested Yes - validated most recent copy scanned in chart (See row information) Yes - validated most recent copy scanned in chart (See row information) Yes - validated most recent copy scanned in chart (See row information)    Would  patient like information on creating a medical advance directive?       No - Patient declined    Current Medications (verified) Outpatient Encounter Medications as of 05/06/2023  Medication Sig   amlodipine-olmesartan (AZOR) 10-20 MG tablet Take 1 tablet by mouth once daily   clobetasol ointment (TEMOVATE) 0.05 % Apply 1 application. topically 2 (two) times a week. Thin application on affected vulva.   cyanocobalamin (VITAMIN B12) 1000 MCG/ML injection Inject Vit B 12 into skin intramuscularly once a week x 3 weeks, then once a month x 3 months.   pantoprazole (PROTONIX) 40 MG tablet Take 1 tablet by mouth once daily   SYRINGE-NEEDLE, DISP, 3 ML (B-D 3CC LUER-LOK SYR 25GX1") 25G X 1" 3 ML MISC USE TO INJECT VITAMIN B12   [DISCONTINUED] donepezil (ARICEPT) 10 MG tablet take half tablet (5 mg) daily for 2 weeks, then on  full tablet at 10 mg daily.   [DISCONTINUED] Guaifenesin 1200 MG TB12 Take by mouth daily. (Patient not taking: Reported on 03/14/2023)   [DISCONTINUED] loratadine (CLARITIN) 10 MG tablet Take 10 mg by mouth daily. (Patient not taking: Reported on 03/14/2023)   No facility-administered encounter medications on file as of 05/06/2023.    Allergies (verified) Ciprocin-fluocin-procin [fluocinolone], Ciprofloxacin, Codeine, Sulfamethoxazole-trimethoprim, and Bupropion   History: Past Medical History:  Diagnosis Date   Allergic rhinitis    Anemia    Aortic atherosclerosis 05/26/2018   Barrett's esophagus with dysplasia 04/07/2019   CKD (chronic kidney disease), stage III (HCC) 02/28/2014  Complication of anesthesia    Degenerative arthritis of lumbar spine 05/26/2018   Evaluation by Dr. Alvester Morin in 9/19. A/P: Chronic history of low back pain and radicular pain status post lumbar discectomy and laminectomy by Dr. Venita Lick in 2016.  No recent MRI imaging but no real red flag complaints at this point other than she is getting pain in the low back that is problematic over time  it just has her at this point where she is limited in certain things she can do in its    Diaphragmatic hernia 06/26/2007   Diverticulosis    GERD (gastroesophageal reflux disease) 06/26/2007   Hiatal hernia    History of bronchitis    History of cancer of lung 2006   2006 20% of left lung resected. Former smoker. Dr. Kriste Basque was doing CXR yearly. 09/2017 IMPRESSION: No interval change.  No acute cardiopulmonary findings. Postsurgical change LEFT lung base.   Hyperlipidemia 09/25/2011   IBS (irritable bowel syndrome)    Impacted cerumen of both ears    Insomnia    Insulin resistance 05/31/2018   Irritable bowel syndrome 06/26/2007   Major depression disorder in remission    Major neurocognitive disorder (HCC) 07/30/2019   Osteoarthritis    Osteopenia    PONV (postoperative nausea and vomiting)    Primary osteoarthritis of both first carpometacarpal joints 12/28/2015   Psoriasis    Situational anxiety 11/06/2016   Thoracic lymphadenopathy 09/25/2011   Vitamin D deficiency    Past Surgical History:  Procedure Laterality Date   ABDOMINAL HYSTERECTOMY  1969   APPENDECTOMY     bilateral shouler operations for impingement syndromes Bilateral 2001   BUNIONECTOMY Left    CATARACT EXTRACTION, BILATERAL Bilateral 2012   CHOLECYSTECTOMY  1976   COLONOSCOPY     LAPAROSCOPIC LYSIS INTESTINAL ADHESIONS     left lower lobectomy  11/2004   via VATS and mini-thoractomy for 1.5cm adenoca of lung by Dr. Kirtland Bouchard LAMINECTOMY/DECOMPRESSION MICRODISCECTOMY Left 01/05/2015   Procedure: L3-4 DECOMPRESSION MICRODISCECTOMY ON LEFT;  Surgeon: Venita Lick, MD;  Location: [facility] OR;  Service: Orthopedics;  Laterality: Left;  left Lumbar 3-4   POLYPECTOMY     turmor ear Right 09/2013   urologic sugery with cystocele/rectocele repairs and sling  03/2007   Dr. Vonita Moss   Family History  Problem Relation Age of Onset   Breast cancer Sister 26   Memory loss Sister    Dementia Mother    Heart attack  Mother    CVA Father    Stroke Father    Colon cancer Neg Hx    Esophageal cancer Neg Hx    Pancreatic cancer Neg Hx    Rectal cancer Neg Hx    Stomach cancer Neg Hx    Colon polyps Neg Hx    Social History   Socioeconomic History   Marital status: Widowed    Spouse name: Not on file   Number of children: 2   Years of education: 70   Highest education level: Some college, no degree  Occupational History   Occupation: Retired  Tobacco Use   Smoking status: Former    Current packs/day: 0.00    Average packs/day: 1 pack/day for 30.0 years (30.0 ttl pk-yrs)    Types: Cigarettes    Start date: 06/17/1954    Quit date: 06/17/1984    Years since quitting: 38.9   Smokeless tobacco: Never  Vaping Use   Vaping status: Never Used  Substance and Sexual Activity  Alcohol use: No    Alcohol/week: 0.0 standard drinks of alcohol   Drug use: No   Sexual activity: Not Currently    Birth control/protection: Post-menopausal    Comment: 1st intercourse 89 yo-2 partners  Other Topics Concern   Not on file  Social History Narrative   Married (2nd marriage; 1st husband died at age 14) married 30-May-1985 29 years in 05/2014. 2 sons from first marriage, 3 from 2nd (step). 15 grandchildren. Retired in 05/31/07 from [employer]. Hobbies: reading, golf, family time.   Sherrine Maples is POA   Right handed   Lives in single story home with husband   Social Determinants of Health   Financial Resource Strain: Low Risk  (12/31/2021)   Overall Financial Resource Strain (CARDIA)    Difficulty of Paying Living Expenses: Not hard at all  Food Insecurity: No Food Insecurity (12/31/2021)   Hunger Vital Sign    Worried About Running Out of Food in the Last Year: Never true    Ran Out of Food in the Last Year: Never true  Transportation Needs: No Transportation Needs (12/31/2021)   PRAPARE - Administrator, Civil Service (Medical): No    Lack of Transportation (Non-Medical): No  Physical Activity: Inactive  (05/06/2023)   Exercise Vital Sign    Days of Exercise per Week: 0 days    Minutes of Exercise per Session: 0 min  Stress: No Stress Concern Present (05/06/2023)   [employer] of Occupational Health - Occupational Stress Questionnaire    Feeling of Stress : Not at all  Social Connections: Socially Isolated (05/06/2023)   Social Connection and Isolation Panel [NHANES]    Frequency of Communication with Friends and Family: More than three times a week    Frequency of Social Gatherings with Friends and Family: Once a week    Attends Religious Services: Never    Database administrator or Organizations: No    Attends Banker Meetings: Never    Marital Status: Widowed    Tobacco Counseling Counseling given: Not Answered   Clinical Intake:  Pre-visit preparation completed: Yes  Pain : No/denies pain     BMI - recorded: 27.38 Nutritional Status: BMI 25 -29 Overweight Nutritional Risks: None Diabetes: No  How often do you need to have someone help you when you read instructions, pamphlets, or other written materials from your doctor or pharmacy?: 1 - Never  Interpreter Needed?: No  Information entered by :: Lanier Ensign, LPN   Activities of Daily Living    05/06/2023    3:28 PM  In your present state of health, do you have any difficulty performing the following activities:  Hearing? 0  Vision? 0  Difficulty concentrating or making decisions? 0  Walking or climbing stairs? 0  Dressing or bathing? 0  Doing errands, shopping? 0  Preparing Food and eating ? N  Using the Toilet? N  In the past six months, have you accidently leaked urine? N  Do you have problems with loss of bowel control? N  Managing your Medications? N  Managing your Finances? N  Housekeeping or managing your Housekeeping? N    Patient Care Team: Jarold Motto, [place] as PCP - General (Physician Assistant) Mia Creek, MD as Consulting Physician (Ophthalmology) Michele Mcalpine, MD as Consulting Physician (Pulmonary Disease) Tyrell Antonio, MD as Consulting Physician (Physical Medicine and Rehabilitation) Van Clines, MD as Consulting Physician (Neurology)  Indicate any recent Medical Services you may have received from  other than [facility] providers in the past year (date may be approximate).     Assessment:   This is a routine wellness examination for [place].  Hearing/Vision screen Hearing Screening - Comments:: Pt denies any hearing issues  Vision Screening - Comments:: Encouraged to follow up with  eye provider    Goals Addressed   None    Depression Screen    05/06/2023    3:30 PM 03/14/2023    1:09 PM 02/14/2023   10:33 AM 06/26/2022    2:07 PM 01/21/2022    1:47 PM 12/31/2021    1:32 PM 10/19/2021    1:20 PM  PHQ 2/9 Scores  PHQ - 2 Score 0 0 2 0 0 0 0  PHQ- 9 Score 0 0 6 0 0      Fall Risk    05/06/2023    3:32 PM 03/18/2023    1:29 PM 02/14/2023   10:29 AM 01/21/2022    1:47 PM 12/31/2021    1:35 PM  Fall Risk   Falls in the past year? 0 0 1 0 0  Number falls in past yr: 0 0 0 0 0  Injury with Fall? 0 0 1 0 0  Risk for fall due to : No Fall Risks  Impaired balance/gait No Fall Risks Impaired vision  Follow up Falls prevention discussed Falls evaluation completed Falls prevention discussed Falls evaluation completed Falls prevention discussed    MEDICARE RISK AT HOME: Medicare Risk at Home Any stairs in or around the home?: Yes (has assistance) If so, are there any without handrails?: Yes Home free of loose throw rugs in walkways, pet beds, electrical cords, etc?: Yes Adequate lighting in your home to reduce risk of falls?: Yes Life alert?: No Use of a cane, walker or w/c?: No Grab bars in the bathroom?: Yes Shower chair or bench in shower?: Yes Elevated toilet seat or a handicapped toilet?: Yes  TIMED UP AND GO:  Was the test performed?  No    Cognitive Function:    05/06/2023    3:33 PM  MMSE - Mini Mental State Exam   Not completed: Refused      03/18/2023    5:00 PM  Montreal Cognitive Assessment   Visuospatial/ Executive (0/5) 2  Naming (0/3) 3  Attention: Read list of digits (0/2) 2  Attention: Read list of letters (0/1) 1  Attention: Serial 7 subtraction starting at 100 (0/3) 0  Language: Repeat phrase (0/2) 1  Language : Fluency (0/1) 1  Abstraction (0/2) 2  Delayed Recall (0/5) 1  Orientation (0/6) 4  Total 17  Adjusted Score (based on education) 17      12/31/2021    1:37 PM 12/25/2020    2:04 PM 12/13/2019    2:21 PM  6CIT Screen  What Year? 0 points 0 points 0 points  What month? 0 points 0 points 0 points  What time? 0 points 0 points 0 points  Count back from 20 0 points 0 points 0 points  Months in reverse 0 points 0 points 0 points  Repeat phrase 10 points 0 points 4 points  Total Score 10 points 0 points 4 points    Immunizations Immunization History  Administered Date(s) Administered   Fluad Quad(high Dose 65+) 03/16/2021   Influenza Split 03/18/2011, 05/04/2012, 04/17/2017   Influenza Whole 04/02/2006, 03/24/2009, 05/01/2010   Influenza, High Dose Seasonal PF 03/25/2016, 03/20/2017, 03/18/2019   Influenza,inj,Quad PF,6+ Mos 02/28/2014, 04/05/2015   Influenza-Unspecified 03/17/2013, 02/19/2018  PFIZER(Purple Top)SARS-COV-2 Vaccination 07/23/2019, 08/17/2019, 02/04/2020, 04/12/2021   Pneumococcal Conjugate-13 05/02/2014   Pneumococcal Polysaccharide-23 05/04/2015   Tetanus 02/28/2014   Zoster Recombinant(Shingrix) 11/07/2017, 01/08/2018    TDAP status: Due, Education has been provided regarding the importance of this vaccine. Advised may receive this vaccine at local pharmacy or Health Dept. Aware to provide a copy of the vaccination record if obtained from local pharmacy or Health Dept. Verbalized acceptance and understanding.  Flu Vaccine status: Declined, Education has been provided regarding the importance of this vaccine but patient still declined. Advised may  receive this vaccine at local pharmacy or Health Dept. Aware to provide a copy of the vaccination record if obtained from local pharmacy or Health Dept. Verbalized acceptance and understanding.  Pneumococcal vaccine status: Up to date  Covid-19 vaccine status: Information provided on how to obtain vaccines.   Qualifies for Shingles Vaccine? Yes   Zostavax completed Yes   Shingrix Completed?: Yes  Screening Tests Health Maintenance  Topic Date Due   INFLUENZA VACCINE  09/15/2023 (Originally 01/16/2023)   DTaP/Tdap/Td (1 - Tdap) 03/13/2024 (Originally 03/01/2014)   COVID-19 Vaccine (5 - 2023-24 season) 03/13/2024 (Originally 02/16/2023)   Medicare Annual Wellness (AWV)  05/05/2024   Pneumonia Vaccine 66+ Years old  Completed   DEXA SCAN  Completed   Zoster Vaccines- Shingrix  Completed   HPV VACCINES  Aged Out    Health Maintenance  There are no preventive care reminders to display for this patient.   Colorectal cancer screening: No longer required.   Mammogram status: Completed 03/01/22. Repeat every year  Bone Density status: Completed 10/04/14. Results reflect: Bone density results: NORMAL. Repeat every 2 years.   Additional Screening:  Vision Screening: Recommended annual ophthalmology exams for early detection of glaucoma and other disorders of the eye. Is the patient up to date with their annual eye exam?  No  Who is the provider or what is the name of the office in which the patient attends annual eye exams? Encouraged pt to follow up with eye provider  If pt is not established with a provider, would they like to be referred to a provider to establish care? No .   Dental Screening: Recommended annual dental exams for proper oral hygiene  Community Resource Referral / Chronic Care Management: CRR required this visit?  No   CCM required this visit?  No     Plan:     I have personally reviewed and noted the following in the patient's chart:   Medical and social  history Use of alcohol, tobacco or illicit drugs  Current medications and supplements including opioid prescriptions. Patient is not currently taking opioid prescriptions. Functional ability and status Nutritional status Physical activity Advanced directives List of other physicians Hospitalizations, surgeries, and ER visits in previous 12 months Vitals Screenings to include cognitive, depression, and falls Referrals and appointments  In addition, I have reviewed and discussed with patient certain preventive protocols, quality metrics, and best practice recommendations. A written personalized care plan for preventive services as well as general preventive health recommendations were provided to patient.     Marzella Schlein, LPN   16/03/[id number]   After Visit Summary: (Declined) Due to this being a telephonic visit, with patients personalized plan was offered to patient but patient Declined AVS at this time   Nurse Notes: none

## 2023-05-06 NOTE — Patient Instructions (Signed)
Krystal Craig , Thank you for taking time to come for your Medicare Wellness Visit. I appreciate your ongoing commitment to your health goals. Please review the following plan we discussed and let me know if I can assist you in the future.   Referrals/Orders/Follow-Ups/Clinician Recommendations: maintain health and activity  Each day, aim for 6 glasses of water, plenty of protein in your diet and try to get up and walk/ stretch every hour for 5-10 minutes at a time.    This is a list of the screening recommended for you and due dates:  Health Maintenance  Topic Date Due   Flu Shot  09/15/2023*   DTaP/Tdap/Td vaccine (1 - Tdap) 03/13/2024*   COVID-19 Vaccine (5 - 2023-24 season) 03/13/2024*   Medicare Annual Wellness Visit  05/05/2024   Pneumonia Vaccine  Completed   DEXA scan (bone density measurement)  Completed   Zoster (Shingles) Vaccine  Completed   HPV Vaccine  Aged Out  *Topic was postponed. The date shown is not the original due date.    Advanced directives: (In Chart) A copy of your advanced directives are scanned into your chart should your provider ever need it.  Next Medicare Annual Wellness Visit scheduled for next year: Yes

## 2023-05-12 ENCOUNTER — Ambulatory Visit (INDEPENDENT_AMBULATORY_CARE_PROVIDER_SITE_OTHER): Payer: Medicare Other | Admitting: Obstetrics and Gynecology

## 2023-05-12 ENCOUNTER — Encounter: Payer: Self-pay | Admitting: Obstetrics and Gynecology

## 2023-05-12 ENCOUNTER — Telehealth: Payer: Self-pay | Admitting: *Deleted

## 2023-05-12 VITALS — BP 138/74 | HR 75 | Ht 64.0 in | Wt 155.0 lb

## 2023-05-12 DIAGNOSIS — N904 Leukoplakia of vulva: Secondary | ICD-10-CM

## 2023-05-12 HISTORY — DX: Leukoplakia of vulva: N90.4

## 2023-05-12 MED ORDER — CLOBETASOL PROPIONATE 0.05 % EX OINT: TOPICAL_OINTMENT | CUTANEOUS | 1 refills | Status: DC

## 2023-05-12 NOTE — Progress Notes (Signed)
89 y.o. G2P2 female with dementia and LS here for vaginal itching and burning. Presents with step-daughter. Lives alone, has a care provider.  Pt c/o vaginal itching and burning x 2 days. No PMB, dysuria or vaginal discharge.  No LMP recorded (lmp unknown). Patient has had a hysterectomy.    GYN HISTORY: S/p hysterectomy  OB History  Gravida Para Term Preterm AB Living  2 2       2   SAB IAB Ectopic Multiple Live Births               # Outcome Date GA Lbr Len/2nd Weight Sex Type Anes PTL Lv  2 Para           1 Para             Past Medical History:  Diagnosis Date   Allergic rhinitis    Anemia    Aortic atherosclerosis 05/26/2018   Barrett's esophagus with dysplasia 04/07/2019   CKD (chronic kidney disease), stage III (HCC) 02/28/2014   Complication of anesthesia    Degenerative arthritis of lumbar spine 05/26/2018   Evaluation by Dr. Alvester Morin in 9/19. A/P: Chronic history of low back pain and radicular pain status post lumbar discectomy and laminectomy by Dr. Venita Lick in 2016.  No recent MRI imaging but no real red flag complaints at this point other than she is getting pain in the low back that is problematic over time it just has her at this point where she is limited in certain things she can do in its    Diaphragmatic hernia 06/26/2007   Diverticulosis    GERD (gastroesophageal reflux disease) 06/26/2007   Hiatal hernia    History of bronchitis    History of cancer of lung 2006   2006 20% of left lung resected. Former smoker. Dr. Kriste Basque was doing CXR yearly. 09/2017 IMPRESSION: No interval change.  No acute cardiopulmonary findings. Postsurgical change LEFT lung base.   Hyperlipidemia 09/25/2011   IBS (irritable bowel syndrome)    Impacted cerumen of both ears    Insomnia    Insulin resistance 05/31/2018   Irritable bowel syndrome 06/26/2007   Major depression disorder in remission    Major neurocognitive disorder (HCC) 07/30/2019   Osteoarthritis     Osteopenia    PONV (postoperative nausea and vomiting)    Primary osteoarthritis of both first carpometacarpal joints 12/28/2015   Psoriasis    Situational anxiety 11/06/2016   Thoracic lymphadenopathy 09/25/2011   Vitamin D deficiency     Past Surgical History:  Procedure Laterality Date   ABDOMINAL HYSTERECTOMY  1969   APPENDECTOMY     bilateral shouler operations for impingement syndromes Bilateral 2001   BUNIONECTOMY Left    CATARACT EXTRACTION, BILATERAL Bilateral 2012   CHOLECYSTECTOMY  1976   COLONOSCOPY     LAPAROSCOPIC LYSIS INTESTINAL ADHESIONS     left lower lobectomy  11/2004   via VATS and mini-thoractomy for 1.5cm adenoca of lung by Dr. Kirtland Bouchard LAMINECTOMY/DECOMPRESSION MICRODISCECTOMY Left 01/05/2015   Procedure: L3-4 DECOMPRESSION MICRODISCECTOMY ON LEFT;  Surgeon: Venita Lick, MD;  Location: [facility] OR;  Service: Orthopedics;  Laterality: Left;  left Lumbar 3-4   POLYPECTOMY     turmor ear Right 09/2013   urologic sugery with cystocele/rectocele repairs and sling  03/2007   Dr. Vonita Moss    Current Outpatient Medications on File Prior to Visit  Medication Sig Dispense Refill   amlodipine-olmesartan (AZOR) 10-20 MG tablet Take 1 tablet by  mouth once daily 30 tablet 2   cyanocobalamin (VITAMIN B12) 1000 MCG/ML injection Inject Vit B 12 into skin intramuscularly once a week x 3 weeks, then once a month x 3 months. 1 mL 5   pantoprazole (PROTONIX) 40 MG tablet Take 1 tablet by mouth once daily 90 tablet 0   SYRINGE-NEEDLE, DISP, 3 ML (B-D 3CC LUER-LOK SYR 25GX1") 25G X 1" 3 ML MISC USE TO INJECT VITAMIN B12 6 each 0   No current facility-administered medications on file prior to visit.    Allergies  Allergen Reactions   Ciprocin-Fluocin-Procin [Fluocinolone] Other (See Comments)    Leg and arm numbness   Ciprofloxacin Other (See Comments)    Extreme weakness  Other Reaction(s): Extreme weakness   Codeine Other (See Comments)    Chest  pain/tightness  Other Reaction(s): Tightness in chest   Sulfamethoxazole-Trimethoprim Nausea Only   Bupropion Rash      PE Today's Vitals   05/12/23 1405  BP: 138/74  Pulse: 75  SpO2: 95%  Weight: 155 lb (70.3 kg)  Height: 5\' 4"  (1.626 m)   Body mass index is 26.61 kg/m.  Physical Exam Vitals reviewed. Exam conducted with a chaperone present.  Constitutional:      General: She is not in acute distress.    Appearance: Normal appearance.  HENT:     Head: Normocephalic and atraumatic.     Nose: Nose normal.  Eyes:     Extraocular Movements: Extraocular movements intact.     Conjunctiva/sclera: Conjunctivae normal.  Pulmonary:     Effort: Pulmonary effort is normal.  Genitourinary:    General: Normal vulva.     Exam position: Lithotomy position.     Vagina: Normal. No vaginal discharge.     Cervix: Normal. No cervical motion tenderness, discharge or lesion.     Uterus: Normal. Not enlarged and not tender.      Adnexa: Right adnexa normal and left adnexa normal.       Comments: Thinning of skin around introitus, linear fissure of right labia, erythematous plaque along left introitus Musculoskeletal:        General: Normal range of motion.     Cervical back: Normal range of motion.  Neurological:     General: No focal deficit present.     Mental Status: She is alert.  Psychiatric:        Mood and Affect: Mood normal.        Behavior: Behavior normal.       Assessment and Plan:        Lichen sclerosus et atrophicus of the vulva -     Clobetasol Propionate; Thin application on affected vulva daily for 2 weeks. Then decrease to twice weekly.  Dispense: 60 g; Refill: 1   RTO in 2 weeks to reassess  Rosalyn Gess, MD

## 2023-05-12 NOTE — Telephone Encounter (Signed)
Call returned to patients daughter, Joyce Gross, ok per dpr. Reports Hx of LS, needs Rx updated for clobetasol ointment. Reports new symptoms of external vaginal itching and burning, started 11/23. Unrelieved by OTC vaginal itch cream and Vaseline. Joyce Gross is requesting Rx or OV as soon as possible for treatment.   Last OV 09/28/21 with ML.   OV recommended, OV scheduled for today at 1430 with Dr. Kennith Center. Advised can further discuss refill at OV.   Routing to provider for final review.  Will close encounter.

## 2023-05-20 ENCOUNTER — Encounter: Payer: Self-pay | Admitting: Physician Assistant

## 2023-05-20 ENCOUNTER — Telehealth: Payer: Self-pay | Admitting: Physician Assistant

## 2023-05-20 NOTE — Telephone Encounter (Signed)
Patient's daughter called requesting to speak with PCP in regards to recent order placed by Dr. Gwynneth Munson. States she, Dr. Gwynneth Munson and pt agreed on a plan of care, but pt's son would like to double check with PCP. Please Advise. Caller prefers to be called @ [phone].   Caller was informed that this will likely warrant an OV. Caller verbalized understanding but wanted message sent back first.

## 2023-05-20 NOTE — Telephone Encounter (Signed)
Please see message and advise 

## 2023-05-20 NOTE — Progress Notes (Signed)
MRI brain shows a rounded growth in the brain, of unknown significance, will order a MRI with and without contrast  in follow up to further look at it. There are some age related changes, no bleeding, thanks

## 2023-05-20 NOTE — Progress Notes (Signed)
Patient declines any further imaging

## 2023-05-21 NOTE — Telephone Encounter (Signed)
Called pt family and spoke with Joyce Gross, (listed on DPR) Joyce Gross explained after a recent MRI finding Dr Gwynneth Munson is recommending another MRI with contrast to rule out a past stroke and other possibilities. According to pt daughter this wouldn't change anything in patient care plan. Pt family is hesitant on repeating MRI due to pt anxiety and putting patient through unnecessary testing if this doesn't change pt care. Asking for PCP opinion before making a final decision. Advised this may require a VV with Samantha to discuss further. Joyce Gross verbalized understanding and was completely fine with that as well.

## 2023-05-22 NOTE — Telephone Encounter (Signed)
Please call pt daughter Joyce Gross to schedule VV to discuss more with PCP

## 2023-05-22 NOTE — Telephone Encounter (Signed)
Spoke with daughter Joyce Gross and scheduled virtual visit

## 2023-05-28 ENCOUNTER — Ambulatory Visit: Payer: Medicare Other | Admitting: Obstetrics and Gynecology

## 2023-05-30 ENCOUNTER — Telehealth: Payer: Medicare Other | Admitting: Physician Assistant

## 2023-05-30 ENCOUNTER — Encounter: Payer: Self-pay | Admitting: Physician Assistant

## 2023-05-30 DIAGNOSIS — R9389 Abnormal findings on diagnostic imaging of other specified body structures: Secondary | ICD-10-CM | POA: Diagnosis not present

## 2023-05-30 NOTE — Progress Notes (Signed)
**Note Krystal-Identified via Obfuscation** Virtual Visit via Video Note   I, Jarold Motto, connected with  Krystal Craig  ([id number], 06-Oct-1932) on 05/30/23 at  2:20 PM EST by a video-enabled telemedicine application and verified that I am speaking with the correct person using two identifiers.  Location: Patient: Home Provider: [facility] office   I discussed the limitations of evaluation and management by telemedicine and the availability of in person appointments. The patient expressed understanding and agreed to proceed.    History of Present Illness: Krystal Craig is a 89 y.o. who identifies as a female who was assigned female at birth, and is being seen today for abnormal MRI results.  Abnormal MRI results Patient's family member, Joyce Gross, reports that patient's neurologist Durenda Age, PA, ordered MRI brain without contrast in November.  Results were as follows:  IMPRESSION: 1. New rounded lesion within the deep white matter of the right frontal lobe with increased signal on T2 and DWI without corresponding low signal on ADC favored to represent a subacute infarct. However, given patient's history of lung cancer, a follow-up MRI without and with contrast within 4-6 weeks is recommended. 2. Moderate chronic microvascular ischemic changes of the white matter, progressed compared to prior MRI. 3. Moderate diffuse parenchymal volume loss.  Recommendations were to repeat MRI and add contrast for further evaluation of lesion.  Patient's family has decided that they really do not want to undergo any significant further testing that will likely not change her prognosis.  Overall patient is doing remarkably well for her and denies any new or worsening neurodeficits.  Problems:  Patient Active Problem List   Diagnosis Date Noted   Lichen sclerosus et atrophicus of the vulva 05/12/2023   Memory impairment 03/18/2023   Essential hypertension 03/08/2022   Acquired absence of lung (part of) 10/01/2021    Major depression single episode, in partial remission (HCC) 10/01/2021   Vitamin D deficiency 10/01/2021   Major neurocognitive disorder (HCC) 07/30/2019   Barrett's esophagus with dysplasia 04/07/2019   Age-related osteoporosis without current pathological fracture, does not want treatment 05/31/2018   Insulin resistance 05/31/2018   Aortic atherosclerosis (HCC) 05/26/2018   Degenerative arthritis of lumbar spine 05/26/2018   Personal history of other malignant neoplasm of bronchus and lung 11/11/2016   Situational anxiety 11/06/2016   Displacement of lumbar intervertebral disc without myelopathy 01/05/2015   Microscopic hematuria, with previous evaluation by Urology and determined to be benign 09/12/2014   Psoriasis 02/28/2014   Chronic kidney disease, stage 3 unspecified (HCC) 02/28/2014   Thoracic lymphadenopathy 09/25/2011   Insomnia 08/03/2008   GERD (gastroesophageal reflux disease) 06/26/2007   Irritable bowel syndrome 06/26/2007   Pure hypercholesterolemia 06/23/2007    Allergies:  Allergies  Allergen Reactions   Ciprocin-Fluocin-Procin [Fluocinolone] Other (See Comments)    Leg and arm numbness   Ciprofloxacin Other (See Comments)    Extreme weakness  Other Reaction(s): Extreme weakness   Codeine Other (See Comments)    Chest pain/tightness  Other Reaction(s): Tightness in chest   Sulfamethoxazole-Trimethoprim Nausea Only   Bupropion Rash   Medications:  Current Outpatient Medications:    amlodipine-olmesartan (AZOR) 10-20 MG tablet, Take 1 tablet by mouth once daily, Disp: 30 tablet, Rfl: 2   clobetasol ointment (TEMOVATE) 0.05 %, Thin application on affected vulva daily for 2 weeks. Then decrease to twice weekly., Disp: 60 g, Rfl: 1   cyanocobalamin (VITAMIN B12) 1000 MCG/ML injection, Inject Vit B 12 into skin intramuscularly once a week x 3 weeks,  then once a month x 3 months., Disp: 1 mL, Rfl: 5   pantoprazole (PROTONIX) 40 MG tablet, Take 1 tablet by mouth  once daily, Disp: 90 tablet, Rfl: 0   SYRINGE-NEEDLE, DISP, 3 ML (B-D 3CC LUER-LOK SYR 25GX1") 25G X 1" 3 ML MISC, USE TO INJECT VITAMIN B12, Disp: 6 each, Rfl: 0  Observations/Objective: Patient is well-developed, well-nourished in no acute distress.  Resting comfortably  at home.  Head is normocephalic, atraumatic.  No labored breathing.  Speech is clear and coherent with logical content.  Patient is alert and oriented at baseline.    Assessment and Plan: 1. Abnormal MRI (Primary)  I had a discussion with Joyce Gross about pros and cons of getting this additional imaging. At this point we decided that it was in the best interest of the patient to not pursue additional imaging at this time. I did discuss with Joyce Gross that it is possible that this lesion could represent cancer.  Joyce Gross verbalized understanding of that and states that it would likely not change their treatment plan for patient given her age.  If patient has new symptoms or family changes their opinions on her goals of care, they will reach out to their neurologist to order MRI with contrast.   Follow Up Instructions: I discussed the assessment and treatment plan with the patient. The patient was provided an opportunity to ask questions and all were answered. The patient agreed with the plan and demonstrated an understanding of the instructions.  A copy of instructions were sent to the patient via MyChart unless otherwise noted below.   The patient was advised to call back or seek an in-person evaluation if the symptoms worsen or if the condition fails to improve as anticipated.  Jarold Motto, [place]

## 2023-06-20 ENCOUNTER — Encounter: Payer: Self-pay | Admitting: Physician Assistant

## 2023-06-20 ENCOUNTER — Ambulatory Visit (INDEPENDENT_AMBULATORY_CARE_PROVIDER_SITE_OTHER): Payer: Medicare Other | Admitting: Physician Assistant

## 2023-06-20 VITALS — BP 120/72 | HR 76 | Temp 97.9°F | Ht 64.0 in | Wt 156.4 lb

## 2023-06-20 DIAGNOSIS — I10 Essential (primary) hypertension: Secondary | ICD-10-CM

## 2023-06-20 DIAGNOSIS — E538 Deficiency of other specified B group vitamins: Secondary | ICD-10-CM

## 2023-06-20 NOTE — Patient Instructions (Addendum)
It was great to see you!  No changes today. Keep up the good work!  Let's follow-up in 6 months, sooner if you have concerns.  Take care,  Jarold Motto PA-C

## 2023-06-20 NOTE — Progress Notes (Signed)
 Krystal Craig is a 89 y.o. female here for a follow up of a pre-existing problem.  History of Present Illness:   Chief Complaint  Patient presents with   Hypertension    Pt has been checking blood pressure daily, averaging systolic 130's, diastolic 70's   HTN Currently taking amlodipine -olmesartan  10-20 mg. At home blood pressure readings are: <130/70. Patient denies chest pain, SOB, blurred vision, dizziness, unusual headaches, lower leg swelling. Patient is compliant with medication. Denies excessive caffeine intake, stimulant usage, excessive alcohol intake, or increase in salt consumption.  BP Readings from Last 3 Encounters:  06/20/23 120/72  05/12/23 138/74  03/18/23 121/75    B12 deficiency Last injection was in November Family was giving injections She would like this rechecked today and plan to restart if needed   Past Medical History:  Diagnosis Date   Allergic rhinitis    Anemia    Aortic atherosclerosis 05/26/2018   Barrett's esophagus with dysplasia 04/07/2019   CKD (chronic kidney disease), stage III (HCC) 02/28/2014   Complication of anesthesia    Degenerative arthritis of lumbar spine 05/26/2018   Evaluation by Dr. Eldonna in 9/19. A/P: Chronic history of low back pain and radicular pain status post lumbar discectomy and laminectomy by Dr. Donaciano Sprang in 2016.  No recent MRI imaging but no real red flag complaints at this point other than she is getting pain in the low back that is problematic over time it just has her at this point where she is limited in certain things she can do in its    Diaphragmatic hernia 06/26/2007   Diverticulosis    GERD (gastroesophageal reflux disease) 06/26/2007   Hiatal hernia    History of bronchitis    History of cancer of lung 2006   2006 20% of left lung resected. Former smoker. Dr. Christi was doing CXR yearly. 09/2017 IMPRESSION: No interval change.  No acute cardiopulmonary findings. Postsurgical change LEFT lung base.    Hyperlipidemia 09/25/2011   IBS (irritable bowel syndrome)    Impacted cerumen of both ears    Insomnia    Insulin resistance 05/31/2018   Irritable bowel syndrome 06/26/2007   Major depression disorder in remission    Major neurocognitive disorder (HCC) 07/30/2019   Osteoarthritis    Osteopenia    PONV (postoperative nausea and vomiting)    Primary osteoarthritis of both first carpometacarpal joints 12/28/2015   Psoriasis    Situational anxiety 11/06/2016   Thoracic lymphadenopathy 09/25/2011   Vitamin D  deficiency      Social History   Tobacco Use   Smoking status: Former    Current packs/day: 0.00    Average packs/day: 1 pack/day for 30.0 years (30.0 ttl pk-yrs)    Types: Cigarettes    Start date: 06/17/1954    Quit date: 06/17/1984    Years since quitting: 39.0   Smokeless tobacco: Never  Vaping Use   Vaping status: Never Used  Substance Use Topics   Alcohol use: No    Alcohol/week: 0.0 standard drinks of alcohol   Drug use: No    Past Surgical History:  Procedure Laterality Date   ABDOMINAL HYSTERECTOMY  1969   APPENDECTOMY     bilateral shouler operations for impingement syndromes Bilateral 2001   BUNIONECTOMY Left    CATARACT EXTRACTION, BILATERAL Bilateral 2012   CHOLECYSTECTOMY  1976   COLONOSCOPY     LAPAROSCOPIC LYSIS INTESTINAL ADHESIONS     left lower lobectomy  11/2004   via VATS and mini-thoractomy  for 1.5cm adenoca of lung by Dr. Brantley ILES LAMINECTOMY/DECOMPRESSION MICRODISCECTOMY Left 01/05/2015   Procedure: L3-4 DECOMPRESSION MICRODISCECTOMY ON LEFT;  Surgeon: Donaciano Sprang, MD;  Location: [facility];  Service: Orthopedics;  Laterality: Left;  left Lumbar 3-4   POLYPECTOMY     turmor ear Right 09/2013   urologic sugery with cystocele/rectocele repairs and sling  03/2007   Dr. Andra    Family History  Problem Relation Age of Onset   Breast cancer Sister 57   Memory loss Sister    Dementia Mother    Heart attack Mother    CVA Father     Stroke Father    Colon cancer Neg Hx    Esophageal cancer Neg Hx    Pancreatic cancer Neg Hx    Rectal cancer Neg Hx    Stomach cancer Neg Hx    Colon polyps Neg Hx     Allergies  Allergen Reactions   Ciprocin-Fluocin-Procin [Fluocinolone] Other (See Comments)    Leg and arm numbness   Ciprofloxacin  Other (See Comments)    Extreme weakness  Other Reaction(s): Extreme weakness   Codeine Other (See Comments)    Chest pain/tightness  Other Reaction(s): Tightness in chest   Sulfamethoxazole -Trimethoprim  Nausea Only   Bupropion Rash    Current Medications:   Current Outpatient Medications:    amlodipine -olmesartan  (AZOR ) 10-20 MG tablet, Take 1 tablet by mouth once daily, Disp: 30 tablet, Rfl: 2   clobetasol  ointment (TEMOVATE ) 0.05 %, Thin application on affected vulva daily for 2 weeks. Then decrease to twice weekly., Disp: 60 g, Rfl: 1   pantoprazole  (PROTONIX ) 40 MG tablet, Take 1 tablet by mouth once daily, Disp: 90 tablet, Rfl: 0   Review of Systems:   Negative unless otherwise specified per HPI.  Vitals:   Vitals:   06/20/23 1358  BP: 120/72  Pulse: 76  Temp: 97.9 F (36.6 C)  TempSrc: Temporal  SpO2: 97%  Weight: 156 lb 6.1 oz (70.9 kg)  Height: 5' 4 (1.626 m)     Body mass index is 26.84 kg/m.  Physical Exam:   Physical Exam Vitals and nursing note reviewed.  Constitutional:      General: She is not in acute distress.    Appearance: She is well-developed. She is not ill-appearing or toxic-appearing.  Cardiovascular:     Rate and Rhythm: Normal rate and regular rhythm.     Pulses: Normal pulses.     Heart sounds: Normal heart sounds, S1 normal and S2 normal.  Pulmonary:     Effort: Pulmonary effort is normal.     Breath sounds: Normal breath sounds.  Skin:    General: Skin is warm and dry.  Neurological:     Mental Status: She is alert.     GCS: GCS eye subscore is 4. GCS verbal subscore is 5. GCS motor subscore is 6.  Psychiatric:         Speech: Speech normal.        Behavior: Behavior normal. Behavior is cooperative.     Assessment and Plan:   1. Essential hypertension (Primary) Normotensive Continue amlodipine -olmesartan  10-20 mg Follow-up in 6 month(s), sooner if concerns - Basic metabolic panel; Future - Basic metabolic panel  2. B12 deficiency Update B12 and provide recommendations accordingly - Vitamin B12; Future - Vitamin B12    Jennae Hakeem, PA-C

## 2023-06-21 LAB — BASIC METABOLIC PANEL
BUN/Creatinine Ratio: 15 (calc) (ref 6–22)
BUN: 17 mg/dL (ref 7–25)
CO2: 21 mmol/L (ref 20–32)
Calcium: 9.4 mg/dL (ref 8.6–10.4)
Chloride: 103 mmol/L (ref 98–110)
Creat: 1.15 mg/dL — ABNORMAL HIGH (ref 0.60–0.95)
Glucose, Bld: 81 mg/dL (ref 65–99)
Potassium: 4.7 mmol/L (ref 3.5–5.3)
Sodium: 137 mmol/L (ref 135–146)

## 2023-06-21 LAB — VITAMIN B12: Vitamin B-12: 694 pg/mL (ref 200–1100)

## 2023-06-24 NOTE — Progress Notes (Signed)
 Assessment/Plan:   Dementia likely due to Alzheimer's disease   Krystal Craig is a very pleasant 89 y.o. RH female with a history of hypertension, hyperlipidemia, chronic back pain seen today in follow up for memory loss.  She has been initially evaluated in December 2020, with  SLUMS of 17/30..  On October 2024, she returned due to family concern for worsening memory.  MoCA at the time was 17/30.  She was started on donepezil  but had to discontinue due to undesirable GI side effects.  Discussed initiating memantine  5 mg twice daily, and she agrees to try.  She now needs assistance with some ADLs and no longer drives.  Patient has dementia with concern for Alzheimer's disease.       Follow up in 6  months. Patient scheduled for neuropsych evaluation in the near future for diagnostic clarity and disease trajectory Start memantine  5 mg twice daily as directed, side effects discussed Recommend checking hearing to improve comprehension Recommend good control of her cardiovascular risk factors Continue to control mood as per PCP     Subjective:    This patient is accompanied in the office b her daughter who supplements the history.  Previous records as well as any outside records available were reviewed prior to todays visit. Patient was last seen on 03/2023    Any changes in memory since last visit?  About the same, I don't think it is worse at all, better than when she was on the donepezil  daughter says.  She continues to have difficulty remembering new information and recent conversations, names.  Sometimes there is a new and some things will be asked over-daughter says.  She continues to enjoy reading extensively. repeats oneself?  Endorsed Disoriented when walking into a room?  Patient denies    Leaving objects?  May misplace things as before, she may place coffee in the freezer or ice cream in the cabinet, etc.    Wandering behavior?  denies   Any personality changes since  last visit?  denies   Any worsening depression?:  Denies.   Hallucinations or paranoia?  Denies.   Seizures? denies    Any sleep changes?  Sleeps well.  Denies vivid dreams, REM behavior or sleepwalking   Sleep apnea?   Denies.   Any hygiene concerns?  She needs to be reminded, she has a set shower day , when the caregiver comes and gives her a shower. Independent of bathing and dressing?  Endorsed  Does the patient needs help with medications?  Son is in charge of placing the medicines in the pillbox, and daughter is in charge of reminding her.   Who is in charge of the finances?  Son is in charge     Any changes in appetite?  Very good    Patient have trouble swallowing? Denies.   Does the patient cook? Yes, mostly microwaving.  Any headaches?   denies   Chronic back pain  denies   Ambulates with difficulty? Denies.    Recent falls or head injuries? denies     Unilateral weakness, numbness or tingling? denies   Any tremors?  Denies   Any anosmia?  Denies   Any incontinence of urine?Denies Any bowel dysfunction?   Denies      Patient lives alone Does the patient drive? No longer drives     Initial visit 03/18/2023 How long did patient have memory difficulties?  I have no idea. Sometimes there is a loop and some things will  be asked over and over again-daughter says.  Patient has difficulty remembering new information, recent conversations and names of people.  She enjoys reading. repeats oneself?  Endorsed Disoriented when walking into a room?  Denies  Leaving objects in unusual places? Maybe. While unloading she may place coffee in the freezer and ice cream in the cabinet   Wandering behavior? Denies.   Any personality changes, or depression, anxiety? Denies   Hallucinations or paranoia?  Denies.   Seizures? Denies.    Any sleep changes?  Sleeps well. Denies vivid dreams, REM behavior or sleepwalking.   Sleep apnea? Denies.   Any hygiene concerns?  Not a lot, at times  she needs to be reminded, we have a set shower day, when the caregiver comes and helps  Independent of bathing and dressing?  Needs assistance getting dressed.  Who is in charge of the medications? Son is in charge to place the medicines in the box, daughter calls to remind her. Who is in charge of the finances? Son is in charge     Any changes in appetite?   Denies.     Patient have trouble swallowing?  Denies.   Does the patient cook? Yes, not a lot.  Any headaches?  Denies.   Chronic back pain?  Denies.   Ambulates with difficulty?  She walks great  Recent falls or head injuries? Denies.     Vision changes? Denies. Stroke like symptoms?  Denies.   Any tremors?  Denies.   Any anosmia?  Denies.   Any incontinence of urine? Denies.   Any bowel dysfunction? Denies.      Patient lives alone History of heavy alcohol intake? Denies.   History of heavy tobacco use? Denies.   Family history of dementia? Mother had AD.  Does patient drive? Not in 40 years-daughter says     Pertinent labs August 2024: TSH 2.13, B12 low at 123, A1c 6.2, TC 214, triglycerides 251, VLDL 50.2 unremarkable CMP, hemoglobin   11.7  Prior MRI of the brain in 2021 personally reviewed remarkable for mild chronic small vessel ischemic disease, and generalized cerebral atrophy, mild for age.    HISTORY OF PRESENT ILLNESS December 2020:   This is a pleasant 89 year old right-handed woman with a history of hyperlipidemia, chronic back pain, presenting for evaluation of memory loss. She feels her memory is not so good sometimes. Her son Marcey is present during the visit to provide additional information and states that she is usually pretty sharp, but started having more noticeable memory changes in the past 3 months. She has had bouts of nausea in the past where she would be confused and her memory would transiently worsen, then she would get better, however last July/August she had a prolonged bout of nausea and  memory/confusion has not cleared up. She would call her son 2 hours later, not remembering she had talked to him earlier. She called him a month ago because she was confused with her bills and paid some twice, he started helping her. She was getting confused with her medications and he started helping 3 months ago. She lives with her husband who has dementia, her other son manages her husband's medications because 2 weeks ago she called Marcey that she was out of medications, apparently their pillboxes got switched and she had taken her husband medications, while her husband had taken 2 days of her medications. She denies leaving the stove on or misplacing things. She does not drive, she  is the navigator and she and her husband got lost a couple of months ago while he was driving. She has occasional word-finding difficulties. She is independent with dressing and bathing, no hygiene concerns. Marcey notes that laundry and dishes are done. Sometimes she seems very anxious and nervous, more confused. She states mood is fine most of the time, she takes prn clonazepam  for anxiety. No paranoia or hallucinations.    She has occasional mild occipital headaches that do not last long, no associated nausea/vomiting. No dizziness, diplopia, dysarthria/dysphagia, neck pain, focal numbness/tingling/weakness, bowel/bladder dysfunction, anosmia, or tremors. She has chronic back pain. She usually takes medication to help her sleep through the night, no wandering behaviors. No falls. Her sister has memory issues. She denies any history of significant head injuries or alcohol use.      Neuropsych evaluation 08/05/19 primary impairments surrounding all aspects of new learning and memory. An additional normative weakness was exhibited across semantic fluency, while variability was seen across some aspects of executive functioning. The etiology for these deficits is unclear. Prominent difficulties with memory, including limited  evidence to suggest the ability to store previously learned information, can be concerning for Alzheimer's disease. Further weaknesses across semantic fluency (especially in relation to strong phonemic fluency) is consistent with this. However, confrontation naming and several aspects of executive functioning (namely working memory and cognitive flexibility) were consistently strong. Visuospatial functioning was also largely appropriate, which could suggest that she is still in the early stages of this condition if present.    Prior MRI of the brain in 2021 personally reviewed remarkable for mild chronic small vessel ischemic disease, and generalized cerebral atrophy, mild for age.      PREVIOUS MEDICATIONS: Donepezil  (diarrhea, lethargy)  CURRENT MEDICATIONS:  Outpatient Encounter Medications as of 06/25/2023  Medication Sig   memantine  (NAMENDA ) 5 MG tablet Take 1 tablet (5 mg at night) for 2 weeks, then increase to 1 tablet (5 mg) twice a day   amlodipine -olmesartan  (AZOR ) 10-20 MG tablet Take 1 tablet by mouth once daily   clobetasol  ointment (TEMOVATE ) 0.05 % Thin application on affected vulva daily for 2 weeks. Then decrease to twice weekly.   pantoprazole  (PROTONIX ) 40 MG tablet Take 1 tablet by mouth once daily   No facility-administered encounter medications on file as of 06/25/2023.       05/06/2023    3:33 PM  MMSE - Mini Mental State Exam  Not completed: Refused      03/18/2023    5:00 PM  Montreal Cognitive Assessment   Visuospatial/ Executive (0/5) 2  Naming (0/3) 3  Attention: Read list of digits (0/2) 2  Attention: Read list of letters (0/1) 1  Attention: Serial 7 subtraction starting at 100 (0/3) 0  Language: Repeat phrase (0/2) 1  Language : Fluency (0/1) 1  Abstraction (0/2) 2  Delayed Recall (0/5) 1  Orientation (0/6) 4  Total 17  Adjusted Score (based on education) 17    Objective:     PHYSICAL EXAMINATION:    VITALS:   Vitals:   06/25/23 1428  BP:  131/88  Pulse: 92  Resp: 18  SpO2: 99%  Weight: 157 lb (71.2 kg)  Height: 5' 4 (1.626 m)    GEN:  The patient appears stated age and is in NAD. HEENT:  Normocephalic, atraumatic.   Neurological examination:  General: NAD, well-groomed, appears stated age. Orientation: The patient is alert. Oriented to person, not to place or time Cranial nerves: There is good facial  symmetry.The speech is fluent and clear. No aphasia or dysarthria. Fund of knowledge is appropriate. Recent and remote memory are impaired. Attention and concentration are reduced.  Able to name objects and unable to repeat phrases.  Hearing is decreased to conversational tone. Sensation: Sensation is intact to light touch throughout Motor: Strength is at least antigravity x4. DTR's 2/4 in UE/LE     Movement examination: Tone: There is normal tone in the UE/LE Abnormal movements:  no tremor.  No myoclonus.  No asterixis.   Coordination:  There is no decremation with RAM's. Normal finger to nose  Gait and Station: The patient has no difficulty arising out of a deep-seated chair without the use of the hands. The patient's stride length is good.  Gait is cautious and narrow.    Thank you for allowing us  the opportunity to participate in the care of this nice patient. Please do not hesitate to contact us  for any questions or concerns.   Total time spent on today's visit was 36 minutes dedicated to this patient today, preparing to see patient, examining the patient, ordering tests and/or medications and counseling the patient, documenting clinical information in the EHR or other health record, independently interpreting results and communicating results to the patient/family, discussing treatment and goals, answering patient's questions and coordinating care.  Cc:  Job Lukes, PA  Camie Sevin 06/25/2023 5:38 PM

## 2023-06-25 ENCOUNTER — Ambulatory Visit: Payer: Medicare Other | Admitting: Physician Assistant

## 2023-06-25 ENCOUNTER — Encounter: Payer: Self-pay | Admitting: Physician Assistant

## 2023-06-25 VITALS — BP 131/88 | HR 92 | Resp 18 | Ht 64.0 in | Wt 157.0 lb

## 2023-06-25 DIAGNOSIS — R413 Other amnesia: Secondary | ICD-10-CM

## 2023-06-25 MED ORDER — MEMANTINE HCL 5 MG PO TABS: ORAL_TABLET | ORAL | 11 refills | Status: DC

## 2023-06-25 NOTE — Patient Instructions (Addendum)
 It was a pleasure to see you today at our office.   Recommendations:  Neurocognitive evaluation at our office   Start Memantine  5mg  tablets.  Take 1 tablet at bedtime for 2 weeks, then 1 tablet twice daily.    Follow up in 3 months     For psychiatric meds, mood meds: Please have your primary care physician manage these medications.  If you have any severe symptoms of a stroke, or other severe issues such as confusion,severe chills or fever, etc call 911 or go to the ER as you may need to be evaluated further   For guidance regarding [facility] and if placement were needed at the facility, contact Social Worker tel: [phone]  For assessment of decision of mental capacity and competency:  Call Dr. Rosaline Nine, geriatric psychiatrist at [phone]  Counseling regarding caregiver distress, including caregiver depression, anxiety and issues regarding community resources, adult day care programs, adult living facilities, or memory care questions:  please contact your  Primary Doctor's Social Worker   Whom to call: Memory  decline, memory medications: Call our office [phone]    [email or web address]   RECOMMENDATIONS FOR ALL PATIENTS WITH MEMORY PROBLEMS: 1. Continue to exercise (Recommend 30 minutes of walking everyday, or 3 hours every week) 2. Increase social interactions - continue going to [place] and enjoy social gatherings with friends and family 3. Eat healthy, avoid fried foods and eat more fruits and vegetables 4. Maintain adequate blood pressure, blood sugar, and blood cholesterol level. Reducing the risk of stroke and cardiovascular disease also helps promoting better memory. 5. Avoid stressful situations. Live a simple life and avoid aggravations. Organize your time and prepare for the next day in anticipation. 6. Sleep well, avoid any interruptions of sleep and avoid any distractions in the  bedroom that may interfere with adequate sleep quality 7. Avoid sugar, avoid sweets as there is a strong link between excessive sugar intake, diabetes, and cognitive impairment We discussed the Mediterranean diet, which has been shown to help patients reduce the risk of progressive memory disorders and reduces cardiovascular risk. This includes eating fish, eat fruits and green leafy vegetables, nuts like almonds and hazelnuts, walnuts, and also use olive oil. Avoid fast foods and fried foods as much as possible. Avoid sweets and sugar as sugar use has been linked to worsening of memory function.  There is always a concern of gradual progression of memory problems. If this is the case, then we may need to adjust level of care according to patient needs. Support, both to the patient and caregiver, should then be put into place.      You have been referred for a neuropsychological evaluation (i.e., evaluation of memory and thinking abilities). Please bring someone with you to this appointment if possible, as it is helpful for the doctor to hear from both you and another adult who knows you well. Please bring eyeglasses and hearing aids if you wear them.    The evaluation will take approximately 3 hours and has two parts:   The first part is a clinical interview with the neuropsychologist (Dr. Richie or Dr. Jackquline). During the interview, the neuropsychologist will speak with you and the individual you brought to the appointment.    The second part of the evaluation is testing with the doctor's technician Neal or Luke). During the testing, the technician will ask you to remember different types of material, solve problems, and answer some questionnaires. Your family member will not be  present for this portion of the evaluation.   Please note: We must reserve several hours of the neuropsychologist's time and the psychometrician's time for your evaluation appointment. As such, there is a No-Show fee of  $100. If you are unable to attend any of your appointments, please contact our office as soon as possible to reschedule.      DRIVING: Regarding driving, in patients with progressive memory problems, driving will be impaired. We advise to have someone else do the driving if trouble finding directions or if minor accidents are reported. Independent driving assessment is available to determine safety of driving.   If you are interested in the driving assessment, you can contact the following:  [facility] in [place] [phone]  [facility] [phone]  [facility] [phone]  [facility] [phone] or [phone]   FALL PRECAUTIONS: Be cautious when walking. Scan the area for obstacles that may increase the risk of trips and falls. When getting up in the mornings, sit up at the edge of the bed for a few minutes before getting out of bed. Consider elevating the bed at the head end to avoid drop of blood pressure when getting up. Walk always in a well-lit room (use night lights in the walls). Avoid area rugs or power cords from appliances in the middle of the walkways. Use a walker or a cane if necessary and consider physical therapy for balance exercise. Get your eyesight checked regularly.  FINANCIAL OVERSIGHT: Supervision, especially oversight when making financial decisions or transactions is also recommended.  HOME SAFETY: Consider the safety of the kitchen when operating appliances like stoves, microwave oven, and blender. Consider having supervision and share cooking responsibilities until no longer able to participate in those. Accidents with firearms and other hazards in the house should be identified and addressed as well.   ABILITY TO BE LEFT ALONE: If patient is unable to contact 911 operator, consider using [employer], or when the need is there, arrange for someone to stay with patients. Smoking is a fire hazard, consider  supervision or cessation. Risk of wandering should be assessed by caregiver and if detected at any point, supervision and safe proof recommendations should be instituted.  MEDICATION SUPERVISION: Inability to self-administer medication needs to be constantly addressed. Implement a mechanism to ensure safe administration of the medications.      Mediterranean Diet A Mediterranean diet refers to food and lifestyle choices that are based on the traditions of countries located on the [place]. This way of eating has been shown to help prevent certain conditions and improve outcomes for people who have chronic diseases, like kidney disease and heart disease. What are tips for following this plan? Lifestyle  Cook and eat meals together with your family, when possible. Drink enough fluid to keep your urine clear or pale yellow. Be physically active every day. This includes: Aerobic exercise like running or swimming. Leisure activities like gardening, walking, or housework. Get 7-8 hours of sleep each night. If recommended by your health care provider, drink red wine in moderation. This means 1 glass a day for nonpregnant women and 2 glasses a day for men. A glass of wine equals 5 oz (150 mL). Reading food labels  Check the serving size of packaged foods. For foods such as rice and pasta, the serving size refers to the amount of cooked product, not dry. Check the total fat in packaged foods. Avoid foods that have saturated fat or trans fats. Check the ingredients  list for added sugars, such as corn syrup. Shopping  At the grocery store, buy most of your food from the areas near the walls of the store. This includes: Fresh fruits and vegetables (produce). Grains, beans, nuts, and seeds. Some of these may be available in unpackaged forms or large amounts (in bulk). Fresh seafood. Poultry and eggs. Low-fat dairy products. Buy whole ingredients instead of prepackaged foods. Buy fresh  fruits and vegetables in-season from local farmers markets. Buy frozen fruits and vegetables in resealable bags. If you do not have access to quality fresh seafood, buy precooked frozen shrimp or canned fish, such as tuna, salmon, or sardines. Buy small amounts of raw or cooked vegetables, salads, or olives from the deli or salad bar at your store. Stock your pantry so you always have certain foods on hand, such as olive oil, canned tuna, canned tomatoes, rice, pasta, and beans. Cooking  Cook foods with extra-virgin olive oil instead of using butter or other vegetable oils. Have meat as a side dish, and have vegetables or grains as your main dish. This means having meat in small portions or adding small amounts of meat to foods like pasta or stew. Use beans or vegetables instead of meat in common dishes like chili or lasagna. Experiment with different cooking methods. Try roasting or broiling vegetables instead of steaming or sauteing them. Add frozen vegetables to soups, stews, pasta, or rice. Add nuts or seeds for added healthy fat at each meal. You can add these to yogurt, salads, or vegetable dishes. Marinate fish or vegetables using olive oil, lemon juice, garlic, and fresh herbs. Meal planning  Plan to eat 1 vegetarian meal one day each week. Try to work up to 2 vegetarian meals, if possible. Eat seafood 2 or more times a week. Have healthy snacks readily available, such as: Vegetable sticks with hummus. Greek yogurt. Fruit and nut trail mix. Eat balanced meals throughout the week. This includes: Fruit: 2-3 servings a day Vegetables: 4-5 servings a day Low-fat dairy: 2 servings a day Fish, poultry, or lean meat: 1 serving a day Beans and legumes: 2 or more servings a week Nuts and seeds: 1-2 servings a day Whole grains: 6-8 servings a day Extra-virgin olive oil: 3-4 servings a day Limit red meat and sweets to only a few servings a month What are my food choices? Mediterranean  diet Recommended Grains: Whole-grain pasta. Brown rice. Bulgar wheat. Polenta. Couscous. Whole-wheat bread. [place] Madeira. Vegetables: Artichokes. Beets. Broccoli. Cabbage. Carrots. Eggplant. Green beans. Chard. Kale. Spinach. Onions. Leeks. Peas. Squash. Tomatoes. Peppers. Radishes. Fruits: Apples. Apricots. Avocado. Berries. Bananas. Cherries. Dates. Figs. Grapes. Lemons. Melon. Oranges. Peaches. Plums. Pomegranate. Meats and other protein foods: Beans. Almonds. Sunflower seeds. Pine nuts. Peanuts. Cod. Salmon. Scallops. Shrimp. Tuna. Tilapia. Clams. Oysters. Eggs. Dairy: Low-fat milk. Cheese. Greek yogurt. Beverages: Water. Red wine. Herbal tea. Fats and oils: Extra virgin olive oil. Avocado oil. Grape seed oil. Sweets and desserts: Greek yogurt with honey. Baked apples. Poached pears. Trail mix. Seasoning and other foods: Basil. Cilantro. Coriander. Cumin. Mint. Parsley. Sage. Rosemary. Tarragon. Garlic. Oregano. Thyme. Pepper. Balsalmic vinegar. Tahini. Hummus. Tomato sauce. Olives. Mushrooms. Limit these Grains: Prepackaged pasta or rice dishes. Prepackaged cereal with added sugar. Vegetables: Deep fried potatoes (french fries). Fruits: Fruit canned in syrup. Meats and other protein foods: Beef. Pork. Lamb. Poultry with skin. Hot dogs. [place]. Dairy: Ice cream. Sour cream. Whole milk. Beverages: Juice. Sugar-sweetened soft drinks. Beer. Liquor and spirits. Fats and oils: Butter. Canola oil.  Vegetable oil. Beef fat (tallow). Lard. Sweets and desserts: Cookies. Cakes. Pies. Candy. Seasoning and other foods: Mayonnaise. Premade sauces and marinades. The items listed may not be a complete list. Talk with your dietitian about what dietary choices are right for you. Summary The Mediterranean diet includes both food and lifestyle choices. Eat a variety of fresh fruits and vegetables, beans, nuts, seeds, and whole grains. Limit the amount of red meat and sweets that you eat. Talk with your  health care provider about whether it is safe for you to drink red wine in moderation. This means 1 glass a day for nonpregnant women and 2 glasses a day for men. A glass of wine equals 5 oz (150 mL). This information is not intended to replace advice given to you by your health care provider. Make sure you discuss any questions you have with your health care provider. Document Released: 01/25/2016 Document Revised: 02/27/2016 Document Reviewed: 01/25/2016 Elsevier Interactive Patient Education  2017 [employer].

## 2023-07-23 ENCOUNTER — Other Ambulatory Visit: Payer: Self-pay | Admitting: *Deleted

## 2023-07-23 MED ORDER — AMLODIPINE-OLMESARTAN 10-20 MG PO TABS: 1.0000 | ORAL_TABLET | Freq: Every day | ORAL | 2 refills | Status: DC

## 2023-07-27 ENCOUNTER — Other Ambulatory Visit: Payer: Self-pay | Admitting: Physician Assistant

## 2023-07-29 ENCOUNTER — Other Ambulatory Visit: Payer: Self-pay | Admitting: Physician Assistant

## 2023-07-31 ENCOUNTER — Encounter: Payer: Self-pay | Admitting: Psychology

## 2023-09-15 ENCOUNTER — Ambulatory Visit (INDEPENDENT_AMBULATORY_CARE_PROVIDER_SITE_OTHER): Admitting: Physician Assistant

## 2023-09-15 ENCOUNTER — Encounter: Payer: Self-pay | Admitting: Physician Assistant

## 2023-09-15 VITALS — BP 124/60 | HR 87 | Temp 97.7°F | Ht 64.0 in | Wt 156.5 lb

## 2023-09-15 DIAGNOSIS — R5383 Other fatigue: Secondary | ICD-10-CM | POA: Diagnosis not present

## 2023-09-15 DIAGNOSIS — G44209 Tension-type headache, unspecified, not intractable: Secondary | ICD-10-CM

## 2023-09-15 DIAGNOSIS — H9193 Unspecified hearing loss, bilateral: Secondary | ICD-10-CM

## 2023-09-15 NOTE — Progress Notes (Signed)
 Krystal Craig is a 89 y.o. female here for a new problem.  History of Present Illness:   Chief Complaint  Patient presents with   Hearing Problem    Pt c/o bilateral hearing loss x 1 month   Fatigue    Pt c/o feeling tired and headaches 3 x's a week, Tylenol with relief.    HPI -- Pt is accompanied by her caregiver.   Hearing loss: Pt complains of difficulty hearing.  She reports her hearing has improved after ear lavage was performed.   Fatigue: Pt complains of recent fatigue.  She reports taking naps more frequently.  Her caregiver states at times she sleeps the majority of the time she is present at the home, approximately 6 hours.  She has still been able to sleep well at night.  She is compliant with her medications, taking them by herself using a pill organizer.  Denies any snoring   Headaches: Pt complains of frequent headaches, at least 3 times per week.  Her headaches tend to occur in the mornings.  Takes Tylenol to help headaches, which she notes does not help much.  Has been drinking 2 small bottles of regular soda (Coke) as well as 3 cups of instant coffee.  She has been trying to drink more water at the recommendation of her caregiver.  Has been eating well and eats the meals her care giver meal-preps for her.  She only wears glasses when reading.  No vision changes or vomiting.  Her caregiver reports episodes of nausea and 4 episodes of diarrhea on Friday.   Past Medical History:  Diagnosis Date   Allergic rhinitis    Anemia    Aortic atherosclerosis 05/26/2018   Barrett's esophagus with dysplasia 04/07/2019   CKD (chronic kidney disease), stage III (HCC) 02/28/2014   Complication of anesthesia    Degenerative arthritis of lumbar spine 05/26/2018   Evaluation by Dr. Alvester Morin in 9/19. A/P: Chronic history of low back pain and radicular pain status post lumbar discectomy and laminectomy by Dr. Venita Lick in 2016.  No recent MRI imaging but no real red  flag complaints at this point other than she is getting pain in the low back that is problematic over time it just has her at this point where she is limited in certain things she can do in its    Diaphragmatic hernia 06/26/2007   Diverticulosis    GERD (gastroesophageal reflux disease) 06/26/2007   Hiatal hernia    History of bronchitis    History of cancer of lung 2006   2006 20% of left lung resected. Former smoker. Dr. Kriste Basque was doing CXR yearly. 09/2017 IMPRESSION: No interval change.  No acute cardiopulmonary findings. Postsurgical change LEFT lung base.   Hyperlipidemia 09/25/2011   IBS (irritable bowel syndrome)    Impacted cerumen of both ears    Insomnia    Insulin resistance 05/31/2018   Irritable bowel syndrome 06/26/2007   Major depression disorder in remission    Major neurocognitive disorder (HCC) 07/30/2019   Osteoarthritis    Osteopenia    PONV (postoperative nausea and vomiting)    Primary osteoarthritis of both first carpometacarpal joints 12/28/2015   Psoriasis    Situational anxiety 11/06/2016   Thoracic lymphadenopathy 09/25/2011   Vitamin D deficiency      Social History   Tobacco Use   Smoking status: Former    Current packs/day: 0.00    Average packs/day: 1 pack/day for 30.0 years (30.0 ttl pk-yrs)  Types: Cigarettes    Start date: 06/17/1954    Quit date: 06/17/1984    Years since quitting: 39.2   Smokeless tobacco: Never  Vaping Use   Vaping status: Never Used  Substance Use Topics   Alcohol use: No    Alcohol/week: 0.0 standard drinks of alcohol   Drug use: No    Past Surgical History:  Procedure Laterality Date   ABDOMINAL HYSTERECTOMY  1969   APPENDECTOMY     bilateral shouler operations for impingement syndromes Bilateral 2001   BUNIONECTOMY Left    CATARACT EXTRACTION, BILATERAL Bilateral 2012   CHOLECYSTECTOMY  1976   COLONOSCOPY     LAPAROSCOPIC LYSIS INTESTINAL ADHESIONS     left lower lobectomy  11/2004   via VATS and  mini-thoractomy for 1.5cm adenoca of lung by Dr. Kirtland Bouchard LAMINECTOMY/DECOMPRESSION MICRODISCECTOMY Left 01/05/2015   Procedure: L3-4 DECOMPRESSION MICRODISCECTOMY ON LEFT;  Surgeon: Venita Lick, MD;  Location: [facility] OR;  Service: Orthopedics;  Laterality: Left;  left Lumbar 3-4   POLYPECTOMY     turmor ear Right 09/2013   urologic sugery with cystocele/rectocele repairs and sling  03/2007   Dr. Vonita Moss    Family History  Problem Relation Age of Onset   Breast cancer Sister 57   Memory loss Sister    Dementia Mother    Heart attack Mother    CVA Father    Stroke Father    Colon cancer Neg Hx    Esophageal cancer Neg Hx    Pancreatic cancer Neg Hx    Rectal cancer Neg Hx    Stomach cancer Neg Hx    Colon polyps Neg Hx     Allergies  Allergen Reactions   Ciprocin-Fluocin-Procin [Fluocinolone] Other (See Comments)    Leg and arm numbness   Ciprofloxacin Other (See Comments)    Extreme weakness  Other Reaction(s): Extreme weakness   Codeine Other (See Comments)    Chest pain/tightness  Other Reaction(s): Tightness in chest   Sulfamethoxazole-Trimethoprim Nausea Only   Bupropion Rash    Current Medications:   Current Outpatient Medications:    amlodipine-olmesartan (AZOR) 10-20 MG tablet, Take 1 tablet by mouth daily., Disp: 100 tablet, Rfl: 2   pantoprazole (PROTONIX) 40 MG tablet, Take 1 tablet by mouth once daily, Disp: 90 tablet, Rfl: 0   Review of Systems:   Negative unless otherwise specified per HPI.  Vitals:   Vitals:   09/15/23 1055  BP: 124/60  Pulse: 87  Temp: 97.7 F (36.5 C)  TempSrc: Temporal  SpO2: 95%  Weight: 156 lb 8 oz (71 kg)  Height: 5\' 4"  (1.626 m)     Body mass index is 26.86 kg/m.  Physical Exam:   Physical Exam Vitals and nursing note reviewed.  Constitutional:      General: She is not in acute distress.    Appearance: She is well-developed. She is not ill-appearing or toxic-appearing.  HENT:     Head: Normocephalic  and atraumatic.     Right Ear: Tympanic membrane, ear canal and external ear normal. There is impacted cerumen.     Left Ear: Tympanic membrane, ear canal and external ear normal. There is impacted cerumen.     Nose: Nose normal.     Right Sinus: No maxillary sinus tenderness or frontal sinus tenderness.     Left Sinus: No maxillary sinus tenderness or frontal sinus tenderness.     Mouth/Throat:     Pharynx: Uvula midline. No posterior oropharyngeal erythema.  Eyes:     General: Lids are normal.     Conjunctiva/sclera: Conjunctivae normal.  Neck:     Trachea: Trachea normal.  Cardiovascular:     Rate and Rhythm: Normal rate and regular rhythm.     Heart sounds: Normal heart sounds, S1 normal and S2 normal.  Pulmonary:     Effort: Pulmonary effort is normal.     Breath sounds: Normal breath sounds. No decreased breath sounds, wheezing, rhonchi or rales.  Lymphadenopathy:     Cervical: No cervical adenopathy.  Skin:    General: Skin is warm and dry.  Neurological:     General: No focal deficit present.     Mental Status: She is alert.     Cranial Nerves: Cranial nerves 2-12 are intact.     Sensory: Sensation is intact.     Motor: Motor function is intact.     Comments: Slightly confusion at her baseline  Psychiatric:        Speech: Speech normal.        Behavior: Behavior normal. Behavior is cooperative.    Ceruminosis is noted.  Wax is removed by syringing and manual debridement.   Assessment and Plan:   1. Bilateral hearing loss, unspecified hearing loss type (Primary) No red flags Symptom(s) improved immediately after cleaning ears Recommend close monitoring of symptom(s) and if returns to let us know and we can refer to audiology  2. Fatigue, unspecified type Difficult to assess  Patient and caregiver declined any work-up today I did recommend regular sleep patterns and consistent meal intake If new symptom(s) arise or there is no improvement, will consider blood  work and UA (declined today)  3. Tension-type headache, not intractable, unspecified chronicity pattern Neurology exam wnl Recommend close follow up with [name] or neurology if persists -- there was a recommendation to pursue additional MRI recently but family declined obtaining this Recommend caffeine reduction gradually Push fluid and work on regular meal and water intake Low threshold to follow up with neurology if symptom(s) worsen, persist or do not improve  I, Isabelle Course, acting as a Neurosurgeon for [employer], [place]., have documented all relevant documentation on the behalf of Jarold Motto, [place], as directed by  Jarold Motto, PA while in the presence of Jarold Motto, [place].  I, Jarold Motto, [place], have reviewed all documentation for this visit. The documentation on 09/15/23 for the exam, diagnosis, procedures, and orders are all accurate and complete.  Jarold Motto, PA-C

## 2023-09-15 NOTE — Patient Instructions (Signed)
 It was great to see you!  You had a lot of earwax and we got it all out! If you notice that your hearing is worsening or not improving, let me know and I can tell you next steps  You are drinking too much caffeine Based on your caregiver, you are drinking around 5 servings per day Please drink more water  If headache(s) do not improve, we will get blood work and also discuss with your family if we should be getting the MRI we have discussed in the past  Take care,  Jarold Motto PA-C

## 2023-09-16 ENCOUNTER — Ambulatory Visit: Admitting: Physician Assistant

## 2023-10-10 ENCOUNTER — Encounter: Payer: Self-pay | Admitting: Physician Assistant

## 2023-11-12 ENCOUNTER — Other Ambulatory Visit: Payer: Self-pay | Admitting: Physician Assistant

## 2023-12-02 ENCOUNTER — Ambulatory Visit (INDEPENDENT_AMBULATORY_CARE_PROVIDER_SITE_OTHER): Admitting: Physician Assistant

## 2023-12-02 ENCOUNTER — Encounter: Payer: Self-pay | Admitting: Physician Assistant

## 2023-12-02 VITALS — BP 138/64 | HR 99 | Temp 97.3°F | Ht 64.0 in | Wt 157.5 lb

## 2023-12-02 DIAGNOSIS — R0781 Pleurodynia: Secondary | ICD-10-CM

## 2023-12-02 DIAGNOSIS — R052 Subacute cough: Secondary | ICD-10-CM

## 2023-12-02 MED ORDER — AMOXICILLIN-POT CLAVULANATE 875-125 MG PO TABS: 1.0000 | ORAL_TABLET | Freq: Two times a day (BID) | ORAL | 0 refills | Status: DC

## 2023-12-02 NOTE — Progress Notes (Signed)
 Krystal Craig is a 89 y.o. female here for a new problem.  History of Present Illness:   Chief Complaint  Patient presents with   Cough    Pt c/o cough and congestion x 3 weeks. Has been taking OTC Equate Sinus 12 hour.   She is accompanied by her daughter.   Cough: Pt complains of a cough, congestion, sinus pressure, and nasal congestion  ongoing 3 weeks She states her cough is productive at times. Her daughter states she has been sleeping for long periods, but notes that has been her baseline lately.  Has been taking OTC Equate Sinus 12 hr.  Denies any ear pain, sore throat, or chest pain, fever/chills.  Rib pain Her daughter reports right sided rib/breast pain; has had about 5 episodes in the past 2-3 years.  Describes the pain as being under and behind her right breast. Her symptoms tend to resolve in about 5 minutes after resting/sitting and drinking water. Daughter reports she has been extensively evaluated in the past, but was unable to receive a formal dx.  Daughter also states her family has decided to decline future CT scans due to its effect on Percy; stating it has really upset her in the past.  Denies any SOB during these episodes.  Past Medical History:  Diagnosis Date   Allergic rhinitis    Anemia    Aortic atherosclerosis 05/26/2018   Barrett's esophagus with dysplasia 04/07/2019   CKD (chronic kidney disease), stage III (HCC) 02/28/2014   Complication of anesthesia    Degenerative arthritis of lumbar spine 05/26/2018   Evaluation by Dr. Daisey Dryer in 9/19. A/P: Chronic history of low back pain and radicular pain status post lumbar discectomy and laminectomy by Dr. Mort Ards in 2016.  No recent MRI imaging but no real red flag complaints at this point other than she is getting pain in the low back that is problematic over time it just has her at this point where she is limited in certain things she can do in its    Diaphragmatic hernia 06/26/2007    Diverticulosis    GERD (gastroesophageal reflux disease) 06/26/2007   Hiatal hernia    History of bronchitis    History of cancer of lung 2006   2006 20% of left lung resected. Former smoker. Dr. Elinor Guardian was doing CXR yearly. 09/2017 IMPRESSION: No interval change.  No acute cardiopulmonary findings. Postsurgical change LEFT lung base.   Hyperlipidemia 09/25/2011   IBS (irritable bowel syndrome)    Impacted cerumen of both ears    Insomnia    Insulin resistance 05/31/2018   Irritable bowel syndrome 06/26/2007   Major depression disorder in remission    Major neurocognitive disorder (HCC) 07/30/2019   Osteoarthritis    Osteopenia    PONV (postoperative nausea and vomiting)    Primary osteoarthritis of both first carpometacarpal joints 12/28/2015   Psoriasis    Situational anxiety 11/06/2016   Thoracic lymphadenopathy 09/25/2011   Vitamin D  deficiency      Social History   Tobacco Use   Smoking status: Former    Current packs/day: 0.00    Average packs/day: 1 pack/day for 30.0 years (30.0 ttl pk-yrs)    Types: Cigarettes    Start date: 06/17/1954    Quit date: 06/17/1984    Years since quitting: 39.4   Smokeless tobacco: Never  Vaping Use   Vaping status: Never Used  Substance Use Topics   Alcohol use: No    Alcohol/week: 0.0 standard drinks  of alcohol   Drug use: No    Past Surgical History:  Procedure Laterality Date   ABDOMINAL HYSTERECTOMY  1969   APPENDECTOMY     bilateral shouler operations for impingement syndromes Bilateral 2001   BUNIONECTOMY Left    CATARACT EXTRACTION, BILATERAL Bilateral 2012   CHOLECYSTECTOMY  1976   COLONOSCOPY     LAPAROSCOPIC LYSIS INTESTINAL ADHESIONS     left lower lobectomy  11/2004   via VATS and mini-thoractomy for 1.5cm adenoca of lung by Dr. Hale Level LAMINECTOMY/DECOMPRESSION MICRODISCECTOMY Left 01/05/2015   Procedure: L3-4 DECOMPRESSION MICRODISCECTOMY ON LEFT;  Surgeon: Mort Ards, MD;  Location: [facility] OR;  Service:  Orthopedics;  Laterality: Left;  left Lumbar 3-4   POLYPECTOMY     turmor ear Right 09/2013   urologic sugery with cystocele/rectocele repairs and sling  03/2007   Dr. Milon Aloe    Family History  Problem Relation Age of Onset   Breast cancer Sister 77   Memory loss Sister    Dementia Mother    Heart attack Mother    CVA Father    Stroke Father    Colon cancer Neg Hx    Esophageal cancer Neg Hx    Pancreatic cancer Neg Hx    Rectal cancer Neg Hx    Stomach cancer Neg Hx    Colon polyps Neg Hx     Allergies  Allergen Reactions   Ciprocin-Fluocin-Procin [Fluocinolone] Other (See Comments)    Leg and arm numbness   Ciprofloxacin  Other (See Comments)    Extreme weakness  Other Reaction(s): Extreme weakness   Codeine Other (See Comments)    Chest pain/tightness  Other Reaction(s): Tightness in chest   Sulfamethoxazole -Trimethoprim  Nausea Only   Bupropion Rash    Current Medications:   Current Outpatient Medications:    amlodipine -olmesartan  (AZOR ) 10-20 MG tablet, Take 1 tablet by mouth daily., Disp: 100 tablet, Rfl: 2   amoxicillin -clavulanate (AUGMENTIN) 875-125 MG tablet, Take 1 tablet by mouth 2 (two) times daily., Disp: 14 tablet, Rfl: 0   pantoprazole  (PROTONIX ) 40 MG tablet, Take 1 tablet by mouth once daily, Disp: 90 tablet, Rfl: 1   Review of Systems:   Negative unless otherwise specified per HPI.  Vitals:   Vitals:   12/02/23 1119  BP: 138/64  Pulse: 99  Temp: (!) 97.3 F (36.3 C)  TempSrc: Temporal  SpO2: 96%  Weight: 157 lb 8 oz (71.4 kg)  Height: 5' 4 (1.626 m)     Body mass index is 27.03 kg/m.  Physical Exam:   Physical Exam Vitals and nursing note reviewed.  Constitutional:      General: She is not in acute distress.    Appearance: She is well-developed. She is not ill-appearing or toxic-appearing.  HENT:     Head: Normocephalic and atraumatic.     Right Ear: Tympanic membrane, ear canal and external ear normal. Tympanic membrane is  not erythematous, retracted or bulging.     Left Ear: Tympanic membrane, ear canal and external ear normal. Tympanic membrane is not erythematous, retracted or bulging.     Nose: Congestion and rhinorrhea present.     Right Sinus: Maxillary sinus tenderness present. No frontal sinus tenderness.     Left Sinus: Maxillary sinus tenderness present. No frontal sinus tenderness.     Mouth/Throat:     Pharynx: Uvula midline. No posterior oropharyngeal erythema.   Eyes:     General: Lids are normal.     Conjunctiva/sclera: Conjunctivae normal.  Neck:     Trachea: Trachea normal.   Cardiovascular:     Rate and Rhythm: Normal rate and regular rhythm.     Pulses: Normal pulses.     Heart sounds: Normal heart sounds, S1 normal and S2 normal.  Pulmonary:     Effort: Pulmonary effort is normal.     Breath sounds: Normal breath sounds. No decreased breath sounds, wheezing, rhonchi or rales.  Lymphadenopathy:     Cervical: No cervical adenopathy.   Skin:    General: Skin is warm and dry.   Neurological:     Mental Status: She is alert.     GCS: GCS eye subscore is 4. GCS verbal subscore is 5. GCS motor subscore is 6.   Psychiatric:        Speech: Speech normal.        Behavior: Behavior normal. Behavior is cooperative.     Assessment and Plan:   1. Subacute cough (Primary) No red flags on exam.   Will initiate Augmentin for sinusitis per orders.  Discussed taking medications as prescribed.  Reviewed return precautions including new or worsening fever, SOB, new or worsening cough or other concerns.  Push fluids and rest.  I recommend that patient follow-up if symptoms worsen or persist despite treatment x 7-10 days, sooner if needed.  2. Rib pain on right side No red flags on exam Reviewed recent imaging showing mediastinal lymphadenopathy and chronic pancreatitis -- this was subsequently evaluated by pulmonology and gastroenterology respectively without significant  concerns Discussed that should symptom(s) continue to bother her, we could repeat imaging and/or refer back to specialists to further evaluate She is also no longer doing mammograms -- may consider updating this if within her goals of care -- reviewed with daughter in law all of the above. She states that she will review options with family members and let us  know if there is anything they would like to pursue for further work-up She is in no acute distress today and we recommend close follow up if any new concerns  I, Bernita Bristle, acting as a Neurosurgeon for Alexander Iba, [place]., have documented all relevant documentation on the behalf of Alexander Iba, [place], as directed by   while in the presence of Alexander Iba, [place].  I, Alexander Iba, [place], have reviewed all documentation for this visit. The documentation on 12/02/23 for the exam, diagnosis, procedures, and orders are all accurate and complete.  Alexander Iba, PA-C

## 2023-12-25 ENCOUNTER — Ambulatory Visit: Payer: Medicare Other | Admitting: Physician Assistant

## 2023-12-26 ENCOUNTER — Ambulatory Visit: Payer: Medicare Other | Admitting: Physician Assistant

## 2023-12-30 ENCOUNTER — Emergency Department (HOSPITAL_BASED_OUTPATIENT_CLINIC_OR_DEPARTMENT_OTHER)

## 2023-12-30 ENCOUNTER — Inpatient Hospital Stay (HOSPITAL_BASED_OUTPATIENT_CLINIC_OR_DEPARTMENT_OTHER)
Admission: EM | Admit: 2023-12-30 | Discharge: 2024-01-01 | DRG: 378 | Disposition: A | Discharge status: Home / Self Care | Attending: Internal Medicine | Admitting: Internal Medicine

## 2023-12-30 ENCOUNTER — Ambulatory Visit: Payer: Self-pay

## 2023-12-30 ENCOUNTER — Encounter (HOSPITAL_BASED_OUTPATIENT_CLINIC_OR_DEPARTMENT_OTHER): Payer: Self-pay | Admitting: Emergency Medicine

## 2023-12-30 ENCOUNTER — Other Ambulatory Visit: Payer: Self-pay

## 2023-12-30 DIAGNOSIS — Z79899 Other long term (current) drug therapy: Secondary | ICD-10-CM

## 2023-12-30 DIAGNOSIS — F32A Depression, unspecified: Secondary | ICD-10-CM | POA: Diagnosis present

## 2023-12-30 DIAGNOSIS — F0393 Unspecified dementia, unspecified severity, with mood disturbance: Secondary | ICD-10-CM | POA: Diagnosis present

## 2023-12-30 DIAGNOSIS — I10 Essential (primary) hypertension: Secondary | ICD-10-CM | POA: Diagnosis not present

## 2023-12-30 DIAGNOSIS — I129 Hypertensive chronic kidney disease with stage 1 through stage 4 chronic kidney disease, or unspecified chronic kidney disease: Secondary | ICD-10-CM | POA: Diagnosis present

## 2023-12-30 DIAGNOSIS — N1831 Chronic kidney disease, stage 3a: Secondary | ICD-10-CM | POA: Diagnosis not present

## 2023-12-30 DIAGNOSIS — Z860100 Personal history of colon polyps, unspecified: Secondary | ICD-10-CM

## 2023-12-30 DIAGNOSIS — K22719 Barrett's esophagus with dysplasia, unspecified: Secondary | ICD-10-CM | POA: Diagnosis not present

## 2023-12-30 DIAGNOSIS — E7800 Pure hypercholesterolemia, unspecified: Secondary | ICD-10-CM | POA: Diagnosis not present

## 2023-12-30 DIAGNOSIS — R7989 Other specified abnormal findings of blood chemistry: Secondary | ICD-10-CM | POA: Diagnosis present

## 2023-12-30 DIAGNOSIS — N183 Chronic kidney disease, stage 3 unspecified: Secondary | ICD-10-CM | POA: Diagnosis present

## 2023-12-30 DIAGNOSIS — R2681 Unsteadiness on feet: Secondary | ICD-10-CM | POA: Diagnosis present

## 2023-12-30 DIAGNOSIS — G8929 Other chronic pain: Secondary | ICD-10-CM | POA: Diagnosis not present

## 2023-12-30 DIAGNOSIS — D62 Acute posthemorrhagic anemia: Secondary | ICD-10-CM | POA: Diagnosis not present

## 2023-12-30 DIAGNOSIS — Z888 Allergy status to other drugs, medicaments and biological substances status: Secondary | ICD-10-CM

## 2023-12-30 DIAGNOSIS — K449 Diaphragmatic hernia without obstruction or gangrene: Secondary | ICD-10-CM | POA: Diagnosis not present

## 2023-12-30 DIAGNOSIS — K922 Gastrointestinal hemorrhage, unspecified: Secondary | ICD-10-CM | POA: Diagnosis not present

## 2023-12-30 DIAGNOSIS — F418 Other specified anxiety disorders: Secondary | ICD-10-CM | POA: Diagnosis not present

## 2023-12-30 DIAGNOSIS — Z803 Family history of malignant neoplasm of breast: Secondary | ICD-10-CM

## 2023-12-30 DIAGNOSIS — M549 Dorsalgia, unspecified: Secondary | ICD-10-CM | POA: Diagnosis present

## 2023-12-30 DIAGNOSIS — D631 Anemia in chronic kidney disease: Secondary | ICD-10-CM | POA: Diagnosis present

## 2023-12-30 DIAGNOSIS — K2289 Other specified disease of esophagus: Secondary | ICD-10-CM | POA: Diagnosis not present

## 2023-12-30 DIAGNOSIS — K2971 Gastritis, unspecified, with bleeding: Principal | ICD-10-CM | POA: Diagnosis present

## 2023-12-30 DIAGNOSIS — K2970 Gastritis, unspecified, without bleeding: Secondary | ICD-10-CM

## 2023-12-30 DIAGNOSIS — R42 Dizziness and giddiness: Secondary | ICD-10-CM | POA: Diagnosis not present

## 2023-12-30 DIAGNOSIS — Z90710 Acquired absence of both cervix and uterus: Secondary | ICD-10-CM

## 2023-12-30 DIAGNOSIS — Z823 Family history of stroke: Secondary | ICD-10-CM

## 2023-12-30 DIAGNOSIS — K2950 Unspecified chronic gastritis without bleeding: Secondary | ICD-10-CM | POA: Diagnosis not present

## 2023-12-30 DIAGNOSIS — K920 Hematemesis: Secondary | ICD-10-CM | POA: Diagnosis not present

## 2023-12-30 DIAGNOSIS — R413 Other amnesia: Secondary | ICD-10-CM | POA: Diagnosis present

## 2023-12-30 DIAGNOSIS — F0390 Unspecified dementia without behavioral disturbance: Secondary | ICD-10-CM | POA: Diagnosis not present

## 2023-12-30 DIAGNOSIS — F0394 Unspecified dementia, unspecified severity, with anxiety: Secondary | ICD-10-CM | POA: Diagnosis present

## 2023-12-30 DIAGNOSIS — R55 Syncope and collapse: Principal | ICD-10-CM | POA: Diagnosis present

## 2023-12-30 DIAGNOSIS — Z881 Allergy status to other antibiotic agents status: Secondary | ICD-10-CM | POA: Diagnosis not present

## 2023-12-30 DIAGNOSIS — Z8249 Family history of ischemic heart disease and other diseases of the circulatory system: Secondary | ICD-10-CM | POA: Diagnosis not present

## 2023-12-30 DIAGNOSIS — W19XXXA Unspecified fall, initial encounter: Secondary | ICD-10-CM

## 2023-12-30 DIAGNOSIS — Z885 Allergy status to narcotic agent status: Secondary | ICD-10-CM | POA: Diagnosis not present

## 2023-12-30 DIAGNOSIS — Z902 Acquired absence of lung [part of]: Secondary | ICD-10-CM

## 2023-12-30 DIAGNOSIS — E559 Vitamin D deficiency, unspecified: Secondary | ICD-10-CM | POA: Diagnosis present

## 2023-12-30 DIAGNOSIS — Z87891 Personal history of nicotine dependence: Secondary | ICD-10-CM | POA: Diagnosis not present

## 2023-12-30 DIAGNOSIS — K861 Other chronic pancreatitis: Secondary | ICD-10-CM | POA: Diagnosis present

## 2023-12-30 DIAGNOSIS — Z882 Allergy status to sulfonamides status: Secondary | ICD-10-CM

## 2023-12-30 DIAGNOSIS — R112 Nausea with vomiting, unspecified: Secondary | ICD-10-CM | POA: Diagnosis not present

## 2023-12-30 DIAGNOSIS — Z85118 Personal history of other malignant neoplasm of bronchus and lung: Secondary | ICD-10-CM

## 2023-12-30 DIAGNOSIS — K219 Gastro-esophageal reflux disease without esophagitis: Secondary | ICD-10-CM | POA: Diagnosis present

## 2023-12-30 DIAGNOSIS — Z9049 Acquired absence of other specified parts of digestive tract: Secondary | ICD-10-CM

## 2023-12-30 HISTORY — DX: Gastrointestinal hemorrhage, unspecified: K92.2

## 2023-12-30 LAB — CBC WITH DIFFERENTIAL/PLATELET
Abs Immature Granulocytes: 0.04 K/uL (ref 0.00–0.07)
Basophils Absolute: 0.1 K/uL (ref 0.0–0.1)
Basophils Relative: 1 %
Eosinophils Absolute: 0.2 K/uL (ref 0.0–0.5)
Eosinophils Relative: 3 %
HCT: 36.7 % (ref 36.0–46.0)
Hemoglobin: 11.6 g/dL — ABNORMAL LOW (ref 12.0–15.0)
Immature Granulocytes: 1 %
Lymphocytes Relative: 16 %
Lymphs Abs: 1.1 K/uL (ref 0.7–4.0)
MCH: 26.6 pg (ref 26.0–34.0)
MCHC: 31.6 g/dL (ref 30.0–36.0)
MCV: 84.2 fL (ref 80.0–100.0)
Monocytes Absolute: 0.6 K/uL (ref 0.1–1.0)
Monocytes Relative: 9 %
Neutro Abs: 4.8 K/uL (ref 1.7–7.7)
Neutrophils Relative %: 70 %
Platelets: 319 K/uL (ref 150–400)
RBC: 4.36 MIL/uL (ref 3.87–5.11)
RDW: 15 % (ref 11.5–15.5)
WBC: 6.9 K/uL (ref 4.0–10.5)
nRBC: 0 % (ref 0.0–0.2)

## 2023-12-30 LAB — COMPREHENSIVE METABOLIC PANEL WITH GFR
ALT: 31 U/L (ref 0–44)
AST: 32 U/L (ref 15–41)
Albumin: 4.2 g/dL (ref 3.5–5.0)
Alkaline Phosphatase: 90 U/L (ref 38–126)
Anion gap: 13 (ref 5–15)
BUN: 16 mg/dL (ref 8–23)
CO2: 21 mmol/L — ABNORMAL LOW (ref 22–32)
Calcium: 9.9 mg/dL (ref 8.9–10.3)
Chloride: 104 mmol/L (ref 98–111)
Creatinine, Ser: 1.11 mg/dL — ABNORMAL HIGH (ref 0.44–1.00)
GFR, Estimated: 48 mL/min — ABNORMAL LOW (ref >60)
Glucose, Bld: 100 mg/dL — ABNORMAL HIGH (ref 70–99)
Potassium: 4.1 mmol/L (ref 3.5–5.1)
Sodium: 137 mmol/L (ref 135–145)
Total Bilirubin: 0.7 mg/dL (ref 0.0–1.2)
Total Protein: 7.2 g/dL (ref 6.5–8.1)

## 2023-12-30 LAB — PRO BRAIN NATRIURETIC PEPTIDE: Pro Brain Natriuretic Peptide: 269 pg/mL (ref <300.0)

## 2023-12-30 LAB — CBG MONITORING, ED: Glucose-Capillary: 88 mg/dL (ref 70–99)

## 2023-12-30 LAB — TROPONIN T, HIGH SENSITIVITY
Troponin T High Sensitivity: 159 ng/L (ref <19)
Troponin T High Sensitivity: 166 ng/L (ref <19)
Troponin T High Sensitivity: 162 ng/L (ref <19)

## 2023-12-30 LAB — D-DIMER, QUANTITATIVE: D-Dimer, Quant: 0.8 ug{FEU}/mL — ABNORMAL HIGH (ref 0.00–0.50)

## 2023-12-30 MED ORDER — SODIUM CHLORIDE 0.9 % IV BOLUS: 1000.0000 mL | Freq: Once | INTRAVENOUS | Status: DC

## 2023-12-30 MED ORDER — SODIUM CHLORIDE 0.9 % IV BOLUS
500.0000 mL | Freq: Once | INTRAVENOUS | Status: AC
  Administered 2023-12-30: 500 mL via INTRAVENOUS

## 2023-12-30 MED ORDER — SODIUM CHLORIDE 0.9 % IV SOLN: INTRAVENOUS | Status: DC

## 2023-12-30 MED ORDER — SODIUM CHLORIDE 0.9 % IV SOLN: INTRAVENOUS | Status: AC

## 2023-12-30 MED ORDER — ACETAMINOPHEN 500 MG PO TABS: 1000.0000 mg | ORAL_TABLET | Freq: Once | ORAL | Status: DC

## 2023-12-30 MED ORDER — PANTOPRAZOLE SODIUM 40 MG IV SOLR
40.0000 mg | Freq: Once | INTRAVENOUS | Status: AC
  Administered 2023-12-30: 40 mg via INTRAVENOUS
  Filled 2023-12-30: qty 10

## 2023-12-30 MED ORDER — ACETAMINOPHEN 325 MG PO TABS
650.0000 mg | ORAL_TABLET | Freq: Once | ORAL | Status: AC
  Administered 2023-12-30: 650 mg via ORAL
  Filled 2023-12-30: qty 2

## 2023-12-30 NOTE — Telephone Encounter (Signed)
 FYI Only or Action Required?: FYI only for provider.  Patient was last seen in primary care on 12/02/2023 by Job Lukes, PA.  Called Nurse Triage reporting Fall.  Symptoms began today.  Interventions attempted: Rest, hydration, or home remedies.  Symptoms are: gradually worsening.  Triage Disposition: Go to ED Now (or PCP Triage)  Patient/caregiver understands and will follow disposition?: Yes        Copied from CRM [id number]. Topic: Clinical - Red Word Triage >> Dec 30, 2023  1:45 PM Deleta RAMAN wrote: Red Word that prompted transfer to Nurse Triage: patient had a fall on today 7/15. Hit her chest and arm. Patient is light headed as well. Reason for Disposition  SEVERE dizziness (e.g., unable to stand, requires support to walk, feels like passing out now)    Dizziness after a fall of unknown mechanism  Answer Assessment - Initial Assessment Questions 1. MECHANISM: How did the fall happen?      Feels like she tripped, but family concerned that that may not have happened  2. DOMESTIC VIOLENCE AND ELDER ABUSE SCREENING: Did you fall because someone pushed you or tried to hurt you? If Yes, ask: Are you safe now?     No  3. ONSET: When did the fall happen? (e.g., minutes, hours, or days ago)     1 hours  4. LOCATION: What part of the body hit the ground? (e.g., back, buttocks, head, hips, knees, hands, head, stomach)     Hit Right arm and right chest   5. INJURY: Did you hurt (injure) yourself when you fell? If Yes, ask: What did you injure? Tell me more about this? (e.g., body area; type of injury; pain severity)    Denies Injury  6. PAIN: Is there any pain? If Yes, ask: How bad is the pain? (e.g., Scale 0-10; or none, mild,      No pain  7. SIZE: For cuts, bruises, or swelling, ask: How large is it? (e.g., inches or centimeters)      No  8. PREGNANCY: Is there any chance you are pregnant? When was your last menstrual period?     No  9.  OTHER SYMPTOMS: Do you have any other symptoms? (e.g., dizziness, fever, weakness; new-onset or worsening).      Dizzy and light headed  10. CAUSE: What do you think caused the fall (or falling)? (e.g., dizzy spell, tripped)       Family concerned that patient may have gotten dizzy prior to fall.  Protocols used: Falls and Falling-A-AH, Dizziness - Lightheadedness-A-AH

## 2023-12-30 NOTE — ED Notes (Signed)
 Patient ambulated to bathroom.

## 2023-12-30 NOTE — ED Provider Notes (Signed)
 [facility] AT [facility] Provider Note   CSN: [id number] Arrival date & time: 12/30/23  1419     Patient presents with: Fall and Dizziness   Krystal Craig is a 89 y.o. female.    Fall  Dizziness Associated symptoms: vomiting      89 year old female with medical history significant for GERD, CKD, HLD, HTN, chronic back pain presenting to the emergency department with a chief complaint of lightheadedness and falls.  The patient had a fall today around 1230.  Endorses a sensation of lightheadedness prior to the fall, denies syncope.  Her history is limited as she has some cognitive decline at baseline.  History is aided by her daughter who is also at bedside.  She denies room spinning dizziness.  She had worsening gait instability and an episode of NBNB emesis after the fall.  No head trauma or full loss of consciousness.  She landed on her right side.  Denies any pain at this time.  Her daughter does state that she thinks she had an episode of coffee ground emesis.  Prior to Admission medications   Medication Sig Start Date End Date Taking? Authorizing Provider  acetaminophen  (TYLENOL ) 325 MG tablet Take 650 mg by mouth as needed.   Yes [provider]  amlodipine -olmesartan  (AZOR ) 10-20 MG tablet Take 1 tablet by mouth daily. 07/23/23  Yes Job Lukes, PA  pantoprazole  (PROTONIX ) 40 MG tablet Take 1 tablet by mouth once daily 11/12/23  Yes Job, Samantha, [place]  amoxicillin -clavulanate (AUGMENTIN ) 875-125 MG tablet Take 1 tablet by mouth 2 (two) times daily. Patient not taking: Reported on 12/30/2023 12/02/23   Job Lukes, PA    Allergies: Ciprocin-fluocin-procin [fluocinolone], Ciprofloxacin , Codeine, Sulfamethoxazole -trimethoprim , and Bupropion    Review of Systems  Gastrointestinal:  Positive for vomiting.  Neurological:  Positive for light-headedness.  All other systems reviewed and are negative.   Updated Vital Signs BP (!)  168/71   Pulse 73   Temp 97.8 F (36.6 C)   Resp 17   LMP  (LMP Unknown)   SpO2 95%   Physical Exam Vitals and nursing note reviewed.  Constitutional:      General: She is not in acute distress.    Appearance: She is well-developed.  HENT:     Head: Normocephalic and atraumatic.  Eyes:     Conjunctiva/sclera: Conjunctivae normal.  Cardiovascular:     Rate and Rhythm: Normal rate and regular rhythm.     Heart sounds: No murmur heard. Pulmonary:     Effort: Pulmonary effort is normal. No respiratory distress.     Breath sounds: Normal breath sounds.  Abdominal:     Palpations: Abdomen is soft.     Tenderness: There is no abdominal tenderness.  Musculoskeletal:        General: No swelling.     Cervical back: Neck supple.  Skin:    General: Skin is warm and dry.     Capillary Refill: Capillary refill takes less than 2 seconds.  Neurological:     Mental Status: She is alert.     Comments: MENTAL STATUS EXAM:    Orientation: Alert and oriented to person, place and time.  Memory: Cooperative, follows commands well.  Language: Speech is clear and language is normal.   CRANIAL NERVES:    CN 2 (Optic): Visual fields intact to confrontation.  CN 3,4,6 (EOM): Pupils equal and reactive to light. Full extraocular eye movement without nystagmus.  CN 5 (Trigeminal): Facial sensation is normal,  no weakness of masticatory muscles.  CN 7 (Facial): No facial weakness or asymmetry.  CN 8 (Auditory): Auditory acuity grossly normal.  CN 9,10 (Glossophar): The uvula is midline, the palate elevates symmetrically.  CN 11 (spinal access): Normal sternocleidomastoid and trapezius strength.  CN 12 (Hypoglossal): The tongue is midline. No atrophy or fasciculations.SABRA   MOTOR:  Muscle Strength: 5/5RUE, 5/5LUE, 5/5RLE, 5/5LLE.   COORDINATION:   Intact finger-to-nose, no tremor.   SENSATION:   Intact to light touch all four extremities.  GAIT: Gait not assessed   Psychiatric:        Mood and  Affect: Mood normal.     (all labs ordered are listed, but only abnormal results are displayed) Labs Reviewed  CBC WITH DIFFERENTIAL/PLATELET - Abnormal; Notable for the following components:      Result Value   Hemoglobin 11.6 (*)    All other components within normal limits  D-DIMER, QUANTITATIVE - Abnormal; Notable for the following components:   D-Dimer, Quant 0.80 (*)    All other components within normal limits  COMPREHENSIVE METABOLIC PANEL WITH GFR - Abnormal; Notable for the following components:   CO2 21 (*)    Glucose, Bld 100 (*)    Creatinine, Ser 1.11 (*)    GFR, Estimated 48 (*)    All other components within normal limits  TROPONIN T, HIGH SENSITIVITY - Abnormal; Notable for the following components:   Troponin T High Sensitivity 159 (*)    All other components within normal limits  TROPONIN T, HIGH SENSITIVITY - Abnormal; Notable for the following components:   Troponin T High Sensitivity 166 (*)    All other components within normal limits  TROPONIN T, HIGH SENSITIVITY - Abnormal; Notable for the following components:   Troponin T High Sensitivity 162 (*)    All other components within normal limits  PRO BRAIN NATRIURETIC PEPTIDE  CBG MONITORING, ED    EKG: EKG Interpretation Date/Time:  Tuesday December 30 2023 17:18:29 EDT Ventricular Rate:  75 PR Interval:  147 QRS Duration:  146 QT Interval:  414 QTC Calculation: 463 R Axis:   -16  Text Interpretation: Sinus rhythm Right bundle branch block Confirmed by Jerrol Agent ([id number]) on 12/30/2023 5:21:14 PM  Radiology: CT HEAD WO CONTRAST Result Date: 12/30/2023 CLINICAL DATA:  Syncope/presyncope.  Fall.  Dizziness. EXAM: CT HEAD WITHOUT CONTRAST TECHNIQUE: Contiguous axial images were obtained from the base of the skull through the vertex without intravenous contrast. RADIATION DOSE REDUCTION: This exam was performed according to the departmental dose-optimization program which includes automated exposure  control, adjustment of the mA and/or kV according to patient size and/or use of iterative reconstruction technique. COMPARISON:  None Available. FINDINGS: Brain: There is atrophy and chronic small vessel disease changes. No acute intracranial abnormality. Specifically, no hemorrhage, hydrocephalus, mass lesion, acute infarction, or significant intracranial injury. Vascular: No hyperdense vessel or unexpected calcification. Skull: No acute calvarial abnormality. Sinuses/Orbits: No acute findings. Mucous retention cyst within the left maxillary sinus. Other: None IMPRESSION: Atrophy, chronic microvascular disease. No acute intracranial abnormality. Electronically Signed   By: Franky Crease M.D.   On: 12/30/2023 15:46   DG Chest Port 1 View Result Date: 12/30/2023 CLINICAL DATA:  near syncope EXAM: PORTABLE CHEST - 1 VIEW COMPARISON:  September 28, 2021 FINDINGS: Streaky atelectasis in the left lung base. No focal airspace consolidation, pleural effusion, or pneumothorax. No cardiomegaly. Tortuous aorta with aortic atherosclerosis. No acute fracture or destructive lesions. Multilevel thoracic osteophytosis. IMPRESSION: No acute cardiopulmonary abnormality.  Electronically Signed   By: Rogelia Myers M.D.   On: 12/30/2023 15:36     Procedures   Medications Ordered in the ED  sodium chloride  0.9 % bolus 500 mL (0 mLs Intravenous Stopped 12/30/23 1616)    And  0.9 %  sodium chloride  infusion ( Intravenous New Bag/Given 12/30/23 1615)  acetaminophen  (TYLENOL ) tablet 650 mg (has no administration in time range)  pantoprazole  (PROTONIX ) injection 40 mg (40 mg Intravenous Given 12/30/23 1607)                                    Medical Decision Making Amount and/or Complexity of Data Reviewed Labs: ordered. Radiology: ordered.  Risk OTC drugs. Prescription drug management. Decision regarding hospitalization.    89 year old female with medical history significant for GERD, CKD, HLD, HTN, chronic back pain  presenting to the emergency department with a chief complaint of lightheadedness and falls.  The patient had a fall today around 1230.  Endorses a sensation of lightheadedness prior to the fall, denies syncope.  Her history is limited as she has some cognitive decline at baseline.  History is aided by her daughter who is also at bedside.  She denies room spinning dizziness.  She had worsening gait instability and an episode of NBNB emesis after the fall.  No head trauma or full loss of consciousness.  She landed on her right side.  Denies any pain at this time.  Her daughter does state that she thinks she had an episode of coffee ground emesis.  On arrival, the patient was afebrile, not tachycardic or tachypneic, hypertensive BP 148/70, saturating 93 to 95% on room air.  Patient with an intact neurologic exam, no abdominal tenderness on exam, lungs CTAB.  Patient presenting with fall, lightheadedness and an episode of coffee-ground emesis.  Patient utilizes NSAIDs, considered gastritis, peptic ulcer disease.  Hemodynamically stable.  IV access obtained the patient was administered IV fluid bolus in addition to IV Pepcid .  Patient without neurologic deficit, low concern for acute stroke.  No room spinning dizziness, no focal deficits.  No full loss of consciousness.  CT imaging of the head obtained: IMPRESSION:  Atrophy, chronic microvascular disease.    No acute intracranial abnormality.    CXR: IMPRESSION:  No acute cardiopulmonary abnormality.    Patient denies any chest pain or shortness of breath but is a limited historian.  Screening laboratory evaluation reveals D-dimer normal after adjustment for age, CBC without a leukocytosis, hemoglobin at baseline at 11.6, CMP without significant electrolyte abnormality, normal renal and liver function.  BNP was normal.  A initial cardiac troponin was elevated at 159, repeat climbing but stable at 166.  I recommended admission for observation in the  setting of the patient's presentation.  I did consult on-call gastroenterology, spoke with Dr. Shila of on-call [facility] gastroenterology.  Hospitalist medicine consulted for admission, Dr. Arthea accepting.  A third cardiac troponin was downtrending to 162.  Low concern for ACS, normal D-dimer, low concern for PE.  Considered cardiac arrhythmia the etiology of the patient's lightheadedness and fall.  Patient will benefit from observation on cardiac telemetry and GI consultation given the report of coffee-ground emesis.  Patient did endorse a headache after admission, was requesting Motrin use, suspect NSAID use to be a contributing factor here.  Patient ordered Tylenol  for headache.  Stable at time of admission.     Final diagnoses:  Near syncope  Fall, initial encounter  Coffee ground emesis  Elevated troponin    ED Discharge Orders     None          Jerrol Agent, MD 12/30/23 2040

## 2023-12-30 NOTE — ED Triage Notes (Signed)
 Dizziness, fall today at 12:30 Dizziness prior to fall  Worsening dizziness and some vomiting after fall Denies hitting head, no LOC Hit right arm and right breast Denies pain post fall  I'm fine  Some memory concerns, prior to fall

## 2023-12-30 NOTE — ED Notes (Signed)
 No dizziness complaints during orthostatic vitals.

## 2023-12-31 DIAGNOSIS — R55 Syncope and collapse: Principal | ICD-10-CM

## 2023-12-31 DIAGNOSIS — K920 Hematemesis: Secondary | ICD-10-CM

## 2023-12-31 DIAGNOSIS — R413 Other amnesia: Secondary | ICD-10-CM | POA: Diagnosis not present

## 2023-12-31 DIAGNOSIS — E7800 Pure hypercholesterolemia, unspecified: Secondary | ICD-10-CM | POA: Diagnosis not present

## 2023-12-31 DIAGNOSIS — F32A Depression, unspecified: Secondary | ICD-10-CM | POA: Diagnosis present

## 2023-12-31 DIAGNOSIS — D62 Acute posthemorrhagic anemia: Secondary | ICD-10-CM | POA: Insufficient documentation

## 2023-12-31 DIAGNOSIS — K922 Gastrointestinal hemorrhage, unspecified: Secondary | ICD-10-CM | POA: Diagnosis not present

## 2023-12-31 DIAGNOSIS — K219 Gastro-esophageal reflux disease without esophagitis: Secondary | ICD-10-CM

## 2023-12-31 DIAGNOSIS — K2970 Gastritis, unspecified, without bleeding: Secondary | ICD-10-CM | POA: Diagnosis not present

## 2023-12-31 DIAGNOSIS — Z79899 Other long term (current) drug therapy: Secondary | ICD-10-CM | POA: Diagnosis not present

## 2023-12-31 DIAGNOSIS — Z881 Allergy status to other antibiotic agents status: Secondary | ICD-10-CM | POA: Diagnosis not present

## 2023-12-31 DIAGNOSIS — Z885 Allergy status to narcotic agent status: Secondary | ICD-10-CM | POA: Diagnosis not present

## 2023-12-31 DIAGNOSIS — K861 Other chronic pancreatitis: Secondary | ICD-10-CM | POA: Diagnosis present

## 2023-12-31 DIAGNOSIS — N1831 Chronic kidney disease, stage 3a: Secondary | ICD-10-CM | POA: Diagnosis not present

## 2023-12-31 DIAGNOSIS — R42 Dizziness and giddiness: Secondary | ICD-10-CM | POA: Diagnosis present

## 2023-12-31 DIAGNOSIS — D631 Anemia in chronic kidney disease: Secondary | ICD-10-CM | POA: Diagnosis present

## 2023-12-31 DIAGNOSIS — K22719 Barrett's esophagus with dysplasia, unspecified: Secondary | ICD-10-CM | POA: Diagnosis not present

## 2023-12-31 DIAGNOSIS — K449 Diaphragmatic hernia without obstruction or gangrene: Secondary | ICD-10-CM | POA: Diagnosis not present

## 2023-12-31 DIAGNOSIS — F0393 Unspecified dementia, unspecified severity, with mood disturbance: Secondary | ICD-10-CM | POA: Diagnosis present

## 2023-12-31 DIAGNOSIS — Z87891 Personal history of nicotine dependence: Secondary | ICD-10-CM | POA: Diagnosis not present

## 2023-12-31 DIAGNOSIS — I129 Hypertensive chronic kidney disease with stage 1 through stage 4 chronic kidney disease, or unspecified chronic kidney disease: Secondary | ICD-10-CM | POA: Diagnosis present

## 2023-12-31 DIAGNOSIS — I10 Essential (primary) hypertension: Secondary | ICD-10-CM | POA: Diagnosis not present

## 2023-12-31 DIAGNOSIS — Z8249 Family history of ischemic heart disease and other diseases of the circulatory system: Secondary | ICD-10-CM | POA: Diagnosis not present

## 2023-12-31 DIAGNOSIS — F418 Other specified anxiety disorders: Secondary | ICD-10-CM

## 2023-12-31 DIAGNOSIS — K2289 Other specified disease of esophagus: Secondary | ICD-10-CM | POA: Diagnosis not present

## 2023-12-31 DIAGNOSIS — E559 Vitamin D deficiency, unspecified: Secondary | ICD-10-CM | POA: Diagnosis present

## 2023-12-31 DIAGNOSIS — R2681 Unsteadiness on feet: Secondary | ICD-10-CM | POA: Diagnosis present

## 2023-12-31 DIAGNOSIS — K2950 Unspecified chronic gastritis without bleeding: Secondary | ICD-10-CM | POA: Diagnosis not present

## 2023-12-31 DIAGNOSIS — R7989 Other specified abnormal findings of blood chemistry: Secondary | ICD-10-CM | POA: Diagnosis present

## 2023-12-31 DIAGNOSIS — K2951 Unspecified chronic gastritis with bleeding: Secondary | ICD-10-CM | POA: Diagnosis not present

## 2023-12-31 DIAGNOSIS — F0394 Unspecified dementia, unspecified severity, with anxiety: Secondary | ICD-10-CM | POA: Diagnosis present

## 2023-12-31 DIAGNOSIS — K2971 Gastritis, unspecified, with bleeding: Secondary | ICD-10-CM | POA: Diagnosis present

## 2023-12-31 DIAGNOSIS — Z90710 Acquired absence of both cervix and uterus: Secondary | ICD-10-CM | POA: Diagnosis not present

## 2023-12-31 DIAGNOSIS — M549 Dorsalgia, unspecified: Secondary | ICD-10-CM | POA: Diagnosis present

## 2023-12-31 DIAGNOSIS — G8929 Other chronic pain: Secondary | ICD-10-CM | POA: Diagnosis present

## 2023-12-31 DIAGNOSIS — F0390 Unspecified dementia without behavioral disturbance: Secondary | ICD-10-CM

## 2023-12-31 HISTORY — DX: Syncope and collapse: R55

## 2023-12-31 HISTORY — DX: Acute posthemorrhagic anemia: D62

## 2023-12-31 LAB — COMPREHENSIVE METABOLIC PANEL WITH GFR
ALT: 31 U/L (ref 0–44)
AST: 31 U/L (ref 15–41)
Albumin: 3.6 g/dL (ref 3.5–5.0)
Alkaline Phosphatase: 82 U/L (ref 38–126)
Anion gap: 12 (ref 5–15)
BUN: 10 mg/dL (ref 8–23)
CO2: 21 mmol/L — ABNORMAL LOW (ref 22–32)
Calcium: 9 mg/dL (ref 8.9–10.3)
Chloride: 103 mmol/L (ref 98–111)
Creatinine, Ser: 1.1 mg/dL — ABNORMAL HIGH (ref 0.44–1.00)
GFR, Estimated: 48 mL/min — ABNORMAL LOW (ref >60)
Glucose, Bld: 108 mg/dL — ABNORMAL HIGH (ref 70–99)
Potassium: 3.8 mmol/L (ref 3.5–5.1)
Sodium: 136 mmol/L (ref 135–145)
Total Bilirubin: 1.5 mg/dL — ABNORMAL HIGH (ref 0.0–1.2)
Total Protein: 6.9 g/dL (ref 6.5–8.1)

## 2023-12-31 LAB — CBC
HCT: 37.9 % (ref 36.0–46.0)
Hemoglobin: 12.3 g/dL (ref 12.0–15.0)
MCH: 26.9 pg (ref 26.0–34.0)
MCHC: 32.5 g/dL (ref 30.0–36.0)
MCV: 82.8 fL (ref 80.0–100.0)
Platelets: 334 K/uL (ref 150–400)
RBC: 4.58 MIL/uL (ref 3.87–5.11)
RDW: 14.9 % (ref 11.5–15.5)
WBC: 8.3 K/uL (ref 4.0–10.5)
nRBC: 0 % (ref 0.0–0.2)

## 2023-12-31 MED ORDER — MELATONIN 3 MG PO TABS
3.0000 mg | ORAL_TABLET | Freq: Every day | ORAL | Status: DC
  Administered 2023-12-31: 3 mg via ORAL
  Filled 2023-12-31: qty 1

## 2023-12-31 MED ORDER — SODIUM CHLORIDE 0.9% FLUSH
3.0000 mL | Freq: Two times a day (BID) | INTRAVENOUS | Status: DC
Start: 2023-12-31 — End: 2024-01-01
  Administered 2023-12-31 – 2024-01-01 (×2): 3 mL via INTRAVENOUS

## 2023-12-31 MED ORDER — ACETAMINOPHEN 325 MG PO TABS
650.0000 mg | ORAL_TABLET | Freq: Four times a day (QID) | ORAL | Status: DC | PRN
  Administered 2023-12-31: 650 mg via ORAL
  Filled 2023-12-31: qty 2

## 2023-12-31 MED ORDER — AMLODIPINE BESYLATE 10 MG PO TABS
10.0000 mg | ORAL_TABLET | Freq: Every day | ORAL | Status: DC
  Administered 2024-01-01: 10 mg via ORAL
  Filled 2023-12-31: qty 1

## 2023-12-31 MED ORDER — AMLODIPINE-OLMESARTAN 10-20 MG PO TABS: 1.0000 | ORAL_TABLET | Freq: Every day | ORAL | Status: DC

## 2023-12-31 MED ORDER — PANTOPRAZOLE SODIUM 40 MG IV SOLR
40.0000 mg | Freq: Two times a day (BID) | INTRAVENOUS | Status: DC
  Administered 2023-12-31 – 2024-01-01 (×2): 40 mg via INTRAVENOUS
  Filled 2023-12-31 (×2): qty 10

## 2023-12-31 MED ORDER — ACETAMINOPHEN 650 MG RE SUPP: 650.0000 mg | Freq: Four times a day (QID) | RECTAL | Status: DC | PRN

## 2023-12-31 MED ORDER — IRBESARTAN 150 MG PO TABS
150.0000 mg | ORAL_TABLET | Freq: Every day | ORAL | Status: DC
  Administered 2024-01-01: 150 mg via ORAL
  Filled 2023-12-31: qty 1

## 2023-12-31 MED ORDER — PANTOPRAZOLE SODIUM 40 MG PO TBEC
40.0000 mg | DELAYED_RELEASE_TABLET | Freq: Once | ORAL | Status: AC
  Administered 2023-12-31: 40 mg via ORAL

## 2023-12-31 MED ORDER — AMLODIPINE BESYLATE 5 MG PO TABS
10.0000 mg | ORAL_TABLET | Freq: Once | ORAL | Status: AC
  Administered 2023-12-31: 10 mg via ORAL

## 2023-12-31 NOTE — ED Notes (Signed)
 [facility] is otw to transport patient to [facility] rm[facility] [id number]

## 2023-12-31 NOTE — Plan of Care (Signed)

## 2023-12-31 NOTE — Consult Note (Signed)
 Consultation  Referring Provider: TR[facility]/Melvin Primary Care Physician:  Job Lukes, PA Primary Gastroenterologist:  Dr. Aneita previous  Reason for Consultation: Coffee-ground emesis  HPI: Krystal Craig is a 89 y.o. female who has history of GERD, Barrett's esophagus, chronic kidney disease, hypertension, dementia, degenerative disc disease, previously diagnosed diverticulosis, history of IBS, colon polyps.  She is status post cholecystectomy, appendectomy hysterectomy and prior lysis of adhesions. Last seen by Dr. Aneita in 2023 she also has prior history of chronic pancreatitis documented. She was brought to emergency room yesterday afternoon after she complained of dizziness and spinning.  She was noted to have some gait instability and felt to have had a presyncopal episode with fall but no head trauma. After that she had an episode of vomiting which per family report appeared to be coffee ground in appearance. To that episode yesterday she had not had any recent complaints of abdominal pain or discomfort, no heartburn or indigestion, no dysphagia.,  No melena or hematochezia.  She does stay on Protonix  40 mg once daily. She is not on any blood thinners.  She has been hemodynamically stable since arrival to [facility] yesterday, she did have CT of the head done which shows atrophy and chronic microvascular disease no acute abnormality. No abdominal imaging done.  Labs yesterday WBC 6.9/hemoglobin 11.6/hematocrit 36.7/platelets 319 Troponins mildly elevated but flat at 159> 166> 162 BUN 16/creatinine 1.11 LFTs within normal limits  Repeat hemoglobin pending today  Last hemoglobin for comparison August 2024 was 11.7  She last had abdominal imaging in 2023 which did show punctate calcifications throughout the pancreas related to chronic pancreatitis no acute inflammatory stranding, sigmoid colon diverticulosis and surgical changes at the GE junction. She has had previous  EGD/Dr. Aneita August 2021 for complaints of nausea and was found to have irregular Z-line, esophagus otherwise normal, small hiatal hernia and otherwise negative exam .  Path showed intestinal metaplasia consistent with Barrett's esophagus, otherwise gastric and duodenal biopsies unremarkable Colonoscopy had been done in 2015 for follow-up of colon polyps and she was found to have large internal hemorrhoids and mild diverticulosis.  Patient was transferred here from [facility] emergency room for GI evaluation.  She has been stable today has not had any further emesis, no complaints of nausea or vomiting or abdominal pain.     Past Medical History:  Diagnosis Date   Allergic rhinitis    Anemia    Aortic atherosclerosis 05/26/2018   Barrett's esophagus with dysplasia 04/07/2019   CKD (chronic kidney disease), stage III (HCC) 02/28/2014   Complication of anesthesia    Degenerative arthritis of lumbar spine 05/26/2018   Evaluation by Dr. Eldonna in 9/19. A/P: Chronic history of low back pain and radicular pain status post lumbar discectomy and laminectomy by Dr. Donaciano Sprang in 2016.  No recent MRI imaging but no real red flag complaints at this point other than she is getting pain in the low back that is problematic over time it just has her at this point where she is limited in certain things she can do in its    Diaphragmatic hernia 06/26/2007   Diverticulosis    GERD (gastroesophageal reflux disease) 06/26/2007   Hiatal hernia    History of bronchitis    History of cancer of lung 2006   2006 20% of left lung resected. Former smoker. Dr. Christi was doing CXR yearly. 09/2017 IMPRESSION: No interval change.  No acute cardiopulmonary findings. Postsurgical change LEFT lung base.   Hyperlipidemia 09/25/2011  IBS (irritable bowel syndrome)    Impacted cerumen of both ears    Insomnia    Insulin resistance 05/31/2018   Irritable bowel syndrome 06/26/2007   Major depression disorder in  remission    Major neurocognitive disorder (HCC) 07/30/2019   Osteoarthritis    Osteopenia    PONV (postoperative nausea and vomiting)    Primary osteoarthritis of both first carpometacarpal joints 12/28/2015   Psoriasis    Situational anxiety 11/06/2016   Thoracic lymphadenopathy 09/25/2011   Vitamin D  deficiency     Past Surgical History:  Procedure Laterality Date   ABDOMINAL HYSTERECTOMY  1969   APPENDECTOMY     bilateral shouler operations for impingement syndromes Bilateral 2001   BUNIONECTOMY Left    CATARACT EXTRACTION, BILATERAL Bilateral 2012   CHOLECYSTECTOMY  1976   COLONOSCOPY     LAPAROSCOPIC LYSIS INTESTINAL ADHESIONS     left lower lobectomy  11/2004   via VATS and mini-thoractomy for 1.5cm adenoca of lung by Dr. Brantley ILES LAMINECTOMY/DECOMPRESSION MICRODISCECTOMY Left 01/05/2015   Procedure: L3-4 DECOMPRESSION MICRODISCECTOMY ON LEFT;  Surgeon: Donaciano Sprang, MD;  Location: [facility] OR;  Service: Orthopedics;  Laterality: Left;  left Lumbar 3-4   POLYPECTOMY     turmor ear Right 09/2013   urologic sugery with cystocele/rectocele repairs and sling  03/2007   Dr. Andra    Prior to Admission medications   Medication Sig Start Date End Date Taking? Authorizing Provider  acetaminophen  (TYLENOL ) 325 MG tablet Take 650 mg by mouth as needed.   Yes [provider]  amlodipine -olmesartan  (AZOR ) 10-20 MG tablet Take 1 tablet by mouth daily. 07/23/23  Yes Job Lukes, PA  pantoprazole  (PROTONIX ) 40 MG tablet Take 1 tablet by mouth once daily 11/12/23  Yes Job, Samantha, [place]  amoxicillin -clavulanate (AUGMENTIN ) 875-125 MG tablet Take 1 tablet by mouth 2 (two) times daily. Patient not taking: Reported on 12/30/2023 12/02/23   Job Lukes, PA    Current Facility-Administered Medications  Medication Dose Route Frequency Provider Last Rate Last Admin   acetaminophen  (TYLENOL ) tablet 650 mg  650 mg Oral Q6H PRN Seena Marsa NOVAK, MD       Or    acetaminophen  (TYLENOL ) suppository 650 mg  650 mg Rectal Q6H PRN Seena Marsa NOVAK, MD       [START ON 01/01/2024] amLODipine  (NORVASC ) tablet 10 mg  10 mg Oral Daily Merilee Linsey I, [place]       And   [START ON 01/01/2024] irbesartan  (AVAPRO ) tablet 150 mg  150 mg Oral Daily Merilee Linsey I, [facility]       pantoprazole  (PROTONIX ) injection 40 mg  40 mg Intravenous Q12H Seena Marsa NOVAK, MD       sodium chloride  flush (NS) 0.9 % injection 3 mL  3 mL Intravenous Q12H Seena Marsa NOVAK, MD        Allergies as of 12/30/2023 - Review Complete 12/30/2023  Allergen Reaction Noted   Ciprocin-fluocin-procin [fluocinolone] Other (See Comments) 09/10/2016   Ciprofloxacin  Other (See Comments) 09/04/2020   Codeine Other (See Comments) 02/14/2023   Sulfamethoxazole -trimethoprim  Nausea Only 09/04/2020   Bupropion Rash 10/21/2016    Family History  Problem Relation Age of Onset   Breast cancer Sister 48   Memory loss Sister    Dementia Mother    Heart attack Mother    CVA Father    Stroke Father    Colon cancer Neg Hx    Esophageal cancer Neg Hx    Pancreatic cancer Neg Hx  Rectal cancer Neg Hx    Stomach cancer Neg Hx    Colon polyps Neg Hx     Social History   Socioeconomic History   Marital status: Widowed    Spouse name: Not on file   Number of children: 2   Years of education: 43   Highest education level: Some college, no degree  Occupational History   Occupation: Retired  Tobacco Use   Smoking status: Former    Current packs/day: 0.00    Average packs/day: 1 pack/day for 30.0 years (30.0 ttl pk-yrs)    Types: Cigarettes    Start date: 06/17/1954    Quit date: 06/17/1984    Years since quitting: 39.5   Smokeless tobacco: Never  Vaping Use   Vaping status: Never Used  Substance and Sexual Activity   Alcohol use: No    Alcohol/week: 0.0 standard drinks of alcohol   Drug use: No   Sexual activity: Not Currently    Birth control/protection: Post-menopausal     Comment: 1st intercourse 23 yo-2 partners  Other Topics Concern   Not on file  Social History Narrative   Married (2nd marriage; 1st husband died at age 2) married 01-18-85 29 years in 05/2014. 2 sons from first marriage, 3 from 2nd (step). 15 grandchildren. Retired in 2007-01-19 from [employer]. Hobbies: reading, golf, family time.   Marcey is POA   Right handed   Lives in single story home with husband   Social Drivers of Health   Financial Resource Strain: Low Risk  (12/31/2021)   Overall Financial Resource Strain (CARDIA)    Difficulty of Paying Living Expenses: Not hard at all  Food Insecurity: No Food Insecurity (12/31/2023)   Hunger Vital Sign    Worried About Running Out of Food in the Last Year: Never true    Ran Out of Food in the Last Year: Never true  Transportation Needs: No Transportation Needs (12/31/2023)   PRAPARE - Administrator, Civil Service (Medical): No    Lack of Transportation (Non-Medical): No  Physical Activity: Inactive (05/06/2023)   Exercise Vital Sign    Days of Exercise per Week: 0 days    Minutes of Exercise per Session: 0 min  Stress: No Stress Concern Present (05/06/2023)   [employer] of Occupational Health - Occupational Stress Questionnaire    Feeling of Stress : Not at all  Social Connections: Socially Isolated (12/31/2023)   Social Connection and Isolation Panel    Frequency of Communication with Friends and Family: More than three times a week    Frequency of Social Gatherings with Friends and Family: Once a week    Attends Religious Services: Never    Database administrator or Organizations: No    Attends Banker Meetings: Never    Marital Status: Widowed  Intimate Partner Violence: Not At Risk (12/31/2023)   Humiliation, Afraid, Rape, and Kick questionnaire    Fear of Current or Ex-Partner: No    Emotionally Abused: No    Physically Abused: No    Sexually Abused: No    Review of Systems: Pertinent positive and  negative review of systems were noted in the above HPI section.  All other review of systems was otherwise negative.   Physical Exam: Vital signs in last 24 hours: Temp:  [97.7 F (36.5 C)-98.2 F (36.8 C)] 97.7 F (36.5 C) (07/16 1519) Pulse Rate:  [64-93] 93 (07/16 1519) Resp:  [13-29] 17 (07/16 1519) BP: (102-183)/(45-124) 154/79 (07/16  1519) SpO2:  [93 %-100 %] 97 % (07/16 1519) Weight:  [72.6 kg] 72.6 kg (07/16 1627) Last BM Date : 12/29/23 General:   Alert,  Well-developed, well-nourished, very elderly white female pleasant and cooperative in NAD, caretaker at bedside, daughter on phone Head:  Normocephalic and atraumatic. Eyes:  Sclera clear, no icterus.   Conjunctiva pink. Ears:  Normal auditory acuity. Nose:  No deformity, discharge,  or lesions. Mouth:  No deformity or lesions.   Neck:  Supple; no masses or thyromegaly. Lungs:  Clear throughout to auscultation.   No wheezes, crackles, or rhonchi.  Heart:  Regular rate and rhythm; no murmurs, clicks, rubs,  or gallops. Abdomen:  Soft,nontender, BS active,nonpalp mass or hsm.,  Well-healed incisional scars Rectal: Not done Msk:  Symmetrical without gross deformities. . Pulses:  Normal pulses noted. Extremities:  Without clubbing or edema. Neurologic:  Alert and  oriented x4;  grossly normal neurologically. Skin:  Intact without significant lesions or rashes.. Psych:  Alert and cooperative. Normal mood and affect.  Intake/Output from previous day: 07/15 0701 - 07/16 0700 In: 500.2 [IV Piggyback:500.2] Out: -  Intake/Output this shift: Total I/O In: 1500.2 [I.V.:1500.2] Out: -   Lab Results: Recent Labs    12/30/23 1508  WBC 6.9  HGB 11.6*  HCT 36.7  PLT 319   BMET Recent Labs    12/30/23 1508  NA 137  K 4.1  CL 104  CO2 21*  GLUCOSE 100*  BUN 16  CREATININE 1.11*  CALCIUM  9.9   LFT Recent Labs    12/30/23 1508  PROT 7.2  ALBUMIN 4.2  AST 32  ALT 31  ALKPHOS 90  BILITOT 0.7   PT/INR No  results for input(s): LABPROT, INR in the last 72 hours. Hepatitis Panel No results for input(s): HEPBSAG, HCVAB, HEPAIGM, HEPBIGM in the last 72 hours.    IMPRESSION:  #48 89 year old white female who presented to the ER yesterday afternoon after an episode of dizziness/spinning and presyncope followed by 1 episode of vomiting with coffee-ground emesis.  CT of the head was negative for any acute changes She has been hemodynamically stable and has not had any further evidence of active GI bleeding, no further nausea vomiting, coffee-ground emesis and no stool.  Initial hemoglobin 11.6 stable from previous baseline And is on chronic PPI  Etiology of the coffee-ground emesis is not clear will need to rule out occult lesion, erosive esophagitis, gastropathy, peptic ulcer disease  #2 history of Barrett's esophagus-last EGD 2023 #3 history of chronic pancreatitis by imaging no active symptoms #4 dementia #5 chronic kidney disease #6 degenerative disc disease/chronic back pain  Plan; clear liquid diet tonight, n.p.o. past midnight Trend hemoglobin Continue IV PPI twice daily Patient will be scheduled for EGD with Dr. Suzann tomorrow morning.  Procedure was discussed in detail with the patient and also with her daughter who was on the phone during the interview, including indications risks and benefits and they are agreeable to proceed. Patient's daughter or son will sign verbal consents tomorrow for procedure Further recommendations pending findings at EGD   Jaysun Wessels PA-C 12/31/2023, 5:08 PM

## 2023-12-31 NOTE — H&P (Addendum)
 History and Physical   Krystal Craig FMW:[id number] DOB: 1932-11-26 DOA: 12/30/2023  PCP: Job Lukes, PA   Patient coming from: Home  Chief Complaint: Near syncope, coffee-ground emesis  HPI: Krystal Craig is a 89 y.o. female with medical history significant of hypertension, hyperlipidemia, GERD, Barrett's esophagus, CKD 3, anxiety, depression, neurocognitive disorder, memory impairment, lung cancer s/p resection presenting with episode of near syncope and coffee-ground emesis.  History obtained with assistance of chart review and family.  Patient reportedly had an episode of near syncope around 12:30 PM yesterday.  She had a prodrome of lightheadedness and then fell to the ground.  She did not lose consciousness.  After the fall she had an episode of coffee-ground emesis per family   Patient denies fevers, chills, chest pain, shortness breath, abdominal pain, constipation, diarrhea   ED Course: Vital signs in the ED notable for blood pressure in the 100s-180s systolic, respiratory rate in the teens-20s.  Lab workup notable for CMP with bicarb 21, creatinine stable 1.11, glucose 100.  CBC with hemoglobin stable at 11.6.  Troponin T trend 159, 166, 162.  proBNP normal.  D-dimer age-adjusted normal at 0.80.    Chest x-ray showed no acute abnormality.  CT head showed no acute abnormality.    Patient received IV PPI, amlodipine , 500 cc IV fluid followed by continuous fluids in the ED.  Review of Systems: As per HPI otherwise all other systems reviewed and are negative.  Past Medical History:  Diagnosis Date   Allergic rhinitis    Anemia    Aortic atherosclerosis 05/26/2018   Barrett's esophagus with dysplasia 04/07/2019   CKD (chronic kidney disease), stage III (HCC) 02/28/2014   Complication of anesthesia    Degenerative arthritis of lumbar spine 05/26/2018   Evaluation by Dr. Eldonna in 9/19. A/P: Chronic history of low back pain and radicular pain status post lumbar  discectomy and laminectomy by Dr. Donaciano Sprang in 2016.  No recent MRI imaging but no real red flag complaints at this point other than she is getting pain in the low back that is problematic over time it just has her at this point where she is limited in certain things she can do in its    Diaphragmatic hernia 06/26/2007   Diverticulosis    GERD (gastroesophageal reflux disease) 06/26/2007   Hiatal hernia    History of bronchitis    History of cancer of lung 2006   2006 20% of left lung resected. Former smoker. Dr. Christi was doing CXR yearly. 09/2017 IMPRESSION: No interval change.  No acute cardiopulmonary findings. Postsurgical change LEFT lung base.   Hyperlipidemia 09/25/2011   IBS (irritable bowel syndrome)    Impacted cerumen of both ears    Insomnia    Insulin resistance 05/31/2018   Irritable bowel syndrome 06/26/2007   Major depression disorder in remission    Major neurocognitive disorder (HCC) 07/30/2019   Osteoarthritis    Osteopenia    PONV (postoperative nausea and vomiting)    Primary osteoarthritis of both first carpometacarpal joints 12/28/2015   Psoriasis    Situational anxiety 11/06/2016   Thoracic lymphadenopathy 09/25/2011   Vitamin D  deficiency     Past Surgical History:  Procedure Laterality Date   ABDOMINAL HYSTERECTOMY  1969   APPENDECTOMY     bilateral shouler operations for impingement syndromes Bilateral 2001   BUNIONECTOMY Left    CATARACT EXTRACTION, BILATERAL Bilateral 2012   CHOLECYSTECTOMY  1976   COLONOSCOPY     LAPAROSCOPIC LYSIS  INTESTINAL ADHESIONS     left lower lobectomy  11/2004   via VATS and mini-thoractomy for 1.5cm adenoca of lung by Dr. Brantley ILES LAMINECTOMY/DECOMPRESSION MICRODISCECTOMY Left 01/05/2015   Procedure: L3-4 DECOMPRESSION MICRODISCECTOMY ON LEFT;  Surgeon: Donaciano Sprang, MD;  Location: [facility];  Service: Orthopedics;  Laterality: Left;  left Lumbar 3-4   POLYPECTOMY     turmor ear Right 09/2013   urologic sugery  with cystocele/rectocele repairs and sling  03/2007   Dr. Andra    Social History  reports that she quit smoking about 39 years ago. Her smoking use included cigarettes. She started smoking about 69 years ago. She has a 30 pack-year smoking history. She has never used smokeless tobacco. She reports that she does not drink alcohol and does not use drugs.  Allergies  Allergen Reactions   Ciprocin-Fluocin-Procin [Fluocinolone] Other (See Comments)    Leg and arm numbness   Ciprofloxacin  Other (See Comments)    Extreme weakness  Other Reaction(s): Extreme weakness   Codeine Other (See Comments)    Chest pain/tightness  Other Reaction(s): Tightness in chest   Sulfamethoxazole -Trimethoprim  Nausea Only   Bupropion Rash    Family History  Problem Relation Age of Onset   Breast cancer Sister 20   Memory loss Sister    Dementia Mother    Heart attack Mother    CVA Father    Stroke Father    Colon cancer Neg Hx    Esophageal cancer Neg Hx    Pancreatic cancer Neg Hx    Rectal cancer Neg Hx    Stomach cancer Neg Hx    Colon polyps Neg Hx   Reviewed on admission  Prior to Admission medications   Medication Sig Start Date End Date Taking? Authorizing Provider  acetaminophen  (TYLENOL ) 325 MG tablet Take 650 mg by mouth as needed.   Yes [provider]  amlodipine -olmesartan  (AZOR ) 10-20 MG tablet Take 1 tablet by mouth daily. 07/23/23  Yes Job Lukes, PA  pantoprazole  (PROTONIX ) 40 MG tablet Take 1 tablet by mouth once daily 11/12/23  Yes Job, Samantha, [place]  amoxicillin -clavulanate (AUGMENTIN ) 875-125 MG tablet Take 1 tablet by mouth 2 (two) times daily. Patient not taking: Reported on 12/30/2023 12/02/23   Job Lukes, [place]    Physical Exam: Vitals:   12/31/23 1100 12/31/23 1127 12/31/23 1400 12/31/23 1519  BP: (!) 167/67 (!) 167/67 (!) 145/74 (!) 154/79  Pulse: 78 78 82 93  Resp: 19 (!) 21 (!) 25 17  Temp:  98.2 F (36.8 C)  97.7 F (36.5 C)  TempSrc:   Oral  Oral  SpO2: 97% 94% 96% 97%    Physical Exam Constitutional:      General: She is not in acute distress.    Appearance: Normal appearance.  HENT:     Head: Normocephalic and atraumatic.     Mouth/Throat:     Mouth: Mucous membranes are moist.     Pharynx: Oropharynx is clear.  Eyes:     Extraocular Movements: Extraocular movements intact.     Pupils: Pupils are equal, round, and reactive to light.  Cardiovascular:     Rate and Rhythm: Normal rate and regular rhythm.     Pulses: Normal pulses.     Heart sounds: Normal heart sounds.  Pulmonary:     Effort: Pulmonary effort is normal. No respiratory distress.     Breath sounds: Normal breath sounds.  Abdominal:     General: Bowel sounds are normal. There is  no distension.     Palpations: Abdomen is soft.     Tenderness: There is no abdominal tenderness.  Musculoskeletal:        General: No swelling or deformity.  Skin:    General: Skin is warm and dry.  Neurological:     General: No focal deficit present.     Mental Status: Mental status is at baseline.     Labs on Admission: I have personally reviewed following labs and imaging studies  CBC: Recent Labs  Lab 12/30/23 1508  WBC 6.9  NEUTROABS 4.8  HGB 11.6*  HCT 36.7  MCV 84.2  PLT 319    Basic Metabolic Panel: Recent Labs  Lab 12/30/23 1508  NA 137  K 4.1  CL 104  CO2 21*  GLUCOSE 100*  BUN 16  CREATININE 1.11*  CALCIUM  9.9    GFR: CrCl cannot be calculated (Unknown ideal weight.).  Liver Function Tests: Recent Labs  Lab 12/30/23 1508  AST 32  ALT 31  ALKPHOS 90  BILITOT 0.7  PROT 7.2  ALBUMIN 4.2    Urine analysis:    Component Value Date/Time   COLORURINE YELLOW 10/01/2021 1415   APPEARANCEUR CLEAR 10/01/2021 1415   LABSPEC 1.010 10/01/2021 1415   PHURINE 7.5 10/01/2021 1415   GLUCOSEU NEGATIVE 10/01/2021 1415   HGBUR NEGATIVE 10/01/2021 1415   BILIRUBINUR NEGATIVE 10/01/2021 1415   BILIRUBINUR Negative 09/22/2019 1411    KETONESUR NEGATIVE 10/01/2021 1415   PROTEINUR 3+ (A) 06/21/2021 1434   UROBILINOGEN 0.2 10/01/2021 1415   NITRITE NEGATIVE 10/01/2021 1415   LEUKOCYTESUR NEGATIVE 10/01/2021 1415    Radiological Exams on Admission: CT HEAD WO CONTRAST Result Date: 12/30/2023 CLINICAL DATA:  Syncope/presyncope.  Fall.  Dizziness. EXAM: CT HEAD WITHOUT CONTRAST TECHNIQUE: Contiguous axial images were obtained from the base of the skull through the vertex without intravenous contrast. RADIATION DOSE REDUCTION: This exam was performed according to the departmental dose-optimization program which includes automated exposure control, adjustment of the mA and/or kV according to patient size and/or use of iterative reconstruction technique. COMPARISON:  None Available. FINDINGS: Brain: There is atrophy and chronic small vessel disease changes. No acute intracranial abnormality. Specifically, no hemorrhage, hydrocephalus, mass lesion, acute infarction, or significant intracranial injury. Vascular: No hyperdense vessel or unexpected calcification. Skull: No acute calvarial abnormality. Sinuses/Orbits: No acute findings. Mucous retention cyst within the left maxillary sinus. Other: None IMPRESSION: Atrophy, chronic microvascular disease. No acute intracranial abnormality. Electronically Signed   By: Franky Crease M.D.   On: 12/30/2023 15:46   DG Chest Port 1 View Result Date: 12/30/2023 CLINICAL DATA:  near syncope EXAM: PORTABLE CHEST - 1 VIEW COMPARISON:  September 28, 2021 FINDINGS: Streaky atelectasis in the left lung base. No focal airspace consolidation, pleural effusion, or pneumothorax. No cardiomegaly. Tortuous aorta with aortic atherosclerosis. No acute fracture or destructive lesions. Multilevel thoracic osteophytosis. IMPRESSION: No acute cardiopulmonary abnormality. Electronically Signed   By: Rogelia Myers M.D.   On: 12/30/2023 15:36    EKG: Independently reviewed.  Sinus rhythm and 75 bpm.  Nonspecific T wave  changes.  Right bundle branch block with QRS 146.  Right bundle blanch block is new from 2023  Assessment/Plan Principal Problem:   UGIB (upper gastrointestinal bleed) Active Problems:   Pure hypercholesterolemia   GERD (gastroesophageal reflux disease)   Chronic kidney disease, stage 3 unspecified (HCC)   Situational anxiety   Barrett's esophagus with dysplasia   Major neurocognitive disorder [facility])   Essential hypertension   Memory  impairment   Near syncope > Patient presenting after episode of lightheadedness that led the patient to fall to the ground.  No loss of consciousness. > Followed by unsteady gait and coffee-ground emesis episode as below. > Unclear etiology for near syncope at this time.  Concern for possible cardiac etiology given flat elevation of troponin and lightheadedness prodrome. > Could have been vasovagal if patient is indeed having upper GI bleeding with reported coffee-ground emesis.  Though initial CBC normal. - Monitor on telemetry - Echocardiogram - Resolved vital signs - Address possible upper GI bleed  Coffee-ground emesis > Patient reportedly had episode of coffee-ground emesis following near syncopal event as described above. > Initial CBC showed stable hemoglobin.  No repeat has been done so far. > Patient did receive IV fluids and IV PPI in the ED. > History of GERD and Barrett's esophagus > GI consulted in the med center ED and will see the patient now that she has arrived  - Monitoring on telemetry as above - Continue with IV PPI - Appreciate GI recommendations and assistance - Clear liquid diet - Repeat CBC and trend  Elevated Troponin > Flat in 160s. Likely strain in the setting of near syncope and fall. > History of nonspecific and thought to be noncardiac chest pain under right breast. - Continue to monitor - F/U Echo  Hypertension - Resume home antihypertensives tomorrow unless patient becomes hypotensive  GERD Barrett's  esophagus - IV PPI as above  CKD 3 > Creatinine stable 1.11 - Trend renal function and electrolytes  Hyperlipidemia - Not on medication for this  Anxiety Depression - Not on medication for this  Memory impairment Neurocognitive disorder - Noted - Not on medication for this  DVT prophylaxis: SCDs Code Status:   Full Family Communication:  Updated at Bedside  Disposition Plan:   Patient is from:  Home  Anticipated DC to:  Home  Anticipated DC date:  2 to 3 days  Anticipated DC barriers: None  Consults called:  Gastroenterology Admission status:  Has been previously accepted as inpatient, telemetry  Severity of Illness: The appropriate patient status for this patient is INPATIENT. Inpatient status is judged to be reasonable and necessary in order to provide the required intensity of service to ensure the patient's safety. The patient's presenting symptoms, physical exam findings, and initial radiographic and laboratory data in the context of their chronic comorbidities is felt to place them at high risk for further clinical deterioration. Furthermore, it is not anticipated that the patient will be medically stable for discharge from the hospital within 2 midnights of admission.   * I certify that at the point of admission it is my clinical judgment that the patient will require inpatient hospital care spanning beyond 2 midnights from the point of admission due to high intensity of service, high risk for further deterioration and high frequency of surveillance required.DEWAINE Marsa KATHEE Seena MD [facility]  How to contact the [facility] Attending or Consulting provider 7A - 7P or covering provider during after hours 7P -7A, for this patient?   Check the care team in [facility] and look for a) attending/consulting [facility] provider listed and b) the [facility] team listed Log into [email or web address] and use [place] universal password to access. If you do not have the password, please contact the  hospital operator. Locate the [facility] provider you are looking for under [facility] and page to a number that you can be directly reached. If you still have  difficulty reaching the provider, please page the [facility] (Director on Call) for the Hospitalists listed on amion for assistance.  12/31/2023, 3:35 PM

## 2024-01-01 ENCOUNTER — Encounter (HOSPITAL_COMMUNITY)
Admission: EM | Disposition: A | Discharge status: Home / Self Care | Payer: Self-pay | Source: Home / Self Care | Attending: Internal Medicine

## 2024-01-01 ENCOUNTER — Inpatient Hospital Stay (HOSPITAL_COMMUNITY): Admitting: Anesthesiology

## 2024-01-01 ENCOUNTER — Inpatient Hospital Stay (HOSPITAL_COMMUNITY)

## 2024-01-01 ENCOUNTER — Encounter (HOSPITAL_COMMUNITY): Payer: Self-pay | Admitting: Internal Medicine

## 2024-01-01 DIAGNOSIS — I10 Essential (primary) hypertension: Secondary | ICD-10-CM | POA: Diagnosis not present

## 2024-01-01 DIAGNOSIS — K2950 Unspecified chronic gastritis without bleeding: Secondary | ICD-10-CM

## 2024-01-01 DIAGNOSIS — K920 Hematemesis: Secondary | ICD-10-CM

## 2024-01-01 DIAGNOSIS — K219 Gastro-esophageal reflux disease without esophagitis: Secondary | ICD-10-CM | POA: Diagnosis not present

## 2024-01-01 DIAGNOSIS — R55 Syncope and collapse: Secondary | ICD-10-CM | POA: Diagnosis not present

## 2024-01-01 DIAGNOSIS — K922 Gastrointestinal hemorrhage, unspecified: Secondary | ICD-10-CM | POA: Diagnosis not present

## 2024-01-01 DIAGNOSIS — K2970 Gastritis, unspecified, without bleeding: Secondary | ICD-10-CM

## 2024-01-01 DIAGNOSIS — K449 Diaphragmatic hernia without obstruction or gangrene: Secondary | ICD-10-CM

## 2024-01-01 DIAGNOSIS — K2289 Other specified disease of esophagus: Secondary | ICD-10-CM

## 2024-01-01 HISTORY — DX: Gastritis, unspecified, without bleeding: K29.70

## 2024-01-01 HISTORY — PX: ESOPHAGOGASTRODUODENOSCOPY: SHX5428

## 2024-01-01 LAB — ECHOCARDIOGRAM COMPLETE
Area-P 1/2: 3.31 cm2
Calc EF: 68.7 %
Height: 64 in
S' Lateral: 1.9 cm
Single Plane A2C EF: 65.1 %
Single Plane A4C EF: 71.9 %
Weight: 2524.8 [oz_av]

## 2024-01-01 LAB — COMPREHENSIVE METABOLIC PANEL WITH GFR
ALT: 26 U/L (ref 0–44)
AST: 24 U/L (ref 15–41)
Albumin: 3.1 g/dL — ABNORMAL LOW (ref 3.5–5.0)
Alkaline Phosphatase: 68 U/L (ref 38–126)
Anion gap: 8 (ref 5–15)
BUN: 10 mg/dL (ref 8–23)
CO2: 24 mmol/L (ref 22–32)
Calcium: 8.8 mg/dL — ABNORMAL LOW (ref 8.9–10.3)
Chloride: 104 mmol/L (ref 98–111)
Creatinine, Ser: 1.15 mg/dL — ABNORMAL HIGH (ref 0.44–1.00)
GFR, Estimated: 46 mL/min — ABNORMAL LOW (ref >60)
Glucose, Bld: 104 mg/dL — ABNORMAL HIGH (ref 70–99)
Potassium: 4.4 mmol/L (ref 3.5–5.1)
Sodium: 136 mmol/L (ref 135–145)
Total Bilirubin: 1.4 mg/dL — ABNORMAL HIGH (ref 0.0–1.2)
Total Protein: 5.9 g/dL — ABNORMAL LOW (ref 6.5–8.1)

## 2024-01-01 LAB — CBC
HCT: 40.3 % (ref 36.0–46.0)
Hemoglobin: 12.8 g/dL (ref 12.0–15.0)
MCH: 27.6 pg (ref 26.0–34.0)
MCHC: 31.8 g/dL (ref 30.0–36.0)
MCV: 87 fL (ref 80.0–100.0)
Platelets: 312 K/uL (ref 150–400)
RBC: 4.63 MIL/uL (ref 3.87–5.11)
RDW: 15 % (ref 11.5–15.5)
WBC: 6.6 K/uL (ref 4.0–10.5)
nRBC: 0 % (ref 0.0–0.2)

## 2024-01-01 LAB — GLUCOSE, CAPILLARY: Glucose-Capillary: 101 mg/dL — ABNORMAL HIGH (ref 70–99)

## 2024-01-01 SURGERY — EGD (ESOPHAGOGASTRODUODENOSCOPY)
Anesthesia: Monitor Anesthesia Care

## 2024-01-01 MED ORDER — SODIUM CHLORIDE 0.9 % IV SOLN: INTRAVENOUS | Status: DC | PRN

## 2024-01-01 MED ORDER — LIDOCAINE 2% (20 MG/ML) 5 ML SYRINGE
INTRAMUSCULAR | Status: DC | PRN
  Administered 2024-01-01: 50 mg via INTRAVENOUS

## 2024-01-01 MED ORDER — PROPOFOL 10 MG/ML IV BOLUS
INTRAVENOUS | Status: DC | PRN
  Administered 2024-01-01: 80 ug/kg/min via INTRAVENOUS
  Administered 2024-01-01: 40 mg via INTRAVENOUS
  Administered 2024-01-01: 20 mg via INTRAVENOUS

## 2024-01-01 MED ORDER — PANTOPRAZOLE SODIUM 40 MG PO TBEC: 40.0000 mg | DELAYED_RELEASE_TABLET | Freq: Two times a day (BID) | ORAL | 0 refills | Status: DC

## 2024-01-01 NOTE — H&P (Signed)
 [facility] History and Physical   Primary Care Physician:  Job Lukes, [place]   Reason for Procedure:  Anemia, coffee-ground emesis  Plan:    Upper endoscopy     HPI: Krystal Craig is a 89 y.o. female undergoing upper endoscopy for investigation of anemia and coffee-ground emesis.  Patient vomited dark material 1 day prior to admission.  Hemoglobin at the time of admission had decreased to 11.6 from baseline of 13.  Hemoglobin now stable at 12.3.  Patient denies NSAIDs and is not on anticoagulation.  Last EGD was performed in 2020 and was overall normal.   Past Medical History:  Diagnosis Date   Allergic rhinitis    Anemia    Aortic atherosclerosis 05/26/2018   Barrett's esophagus with dysplasia 04/07/2019   CKD (chronic kidney disease), stage III (HCC) 02/28/2014   Complication of anesthesia    Degenerative arthritis of lumbar spine 05/26/2018   Evaluation by Dr. Eldonna in 9/19. A/P: Chronic history of low back pain and radicular pain status post lumbar discectomy and laminectomy by Dr. Donaciano Sprang in 2016.  No recent MRI imaging but no real red flag complaints at this point other than she is getting pain in the low back that is problematic over time it just has her at this point where she is limited in certain things she can do in its    Diaphragmatic hernia 06/26/2007   Diverticulosis    GERD (gastroesophageal reflux disease) 06/26/2007   Hiatal hernia    History of bronchitis    History of cancer of lung 2006   2006 20% of left lung resected. Former smoker. Dr. Christi was doing CXR yearly. 09/2017 IMPRESSION: No interval change.  No acute cardiopulmonary findings. Postsurgical change LEFT lung base.   Hyperlipidemia 09/25/2011   IBS (irritable bowel syndrome)    Impacted cerumen of both ears    Insomnia    Insulin resistance 05/31/2018   Irritable bowel syndrome 06/26/2007   Major depression disorder in remission    Major neurocognitive disorder (HCC)  07/30/2019   Osteoarthritis    Osteopenia    PONV (postoperative nausea and vomiting)    Primary osteoarthritis of both first carpometacarpal joints 12/28/2015   Psoriasis    Situational anxiety 11/06/2016   Thoracic lymphadenopathy 09/25/2011   Vitamin D  deficiency     Past Surgical History:  Procedure Laterality Date   ABDOMINAL HYSTERECTOMY  1969   APPENDECTOMY     bilateral shouler operations for impingement syndromes Bilateral 2001   BUNIONECTOMY Left    CATARACT EXTRACTION, BILATERAL Bilateral 2012   CHOLECYSTECTOMY  1976   COLONOSCOPY     LAPAROSCOPIC LYSIS INTESTINAL ADHESIONS     left lower lobectomy  11/2004   via VATS and mini-thoractomy for 1.5cm adenoca of lung by Dr. Brantley ILES LAMINECTOMY/DECOMPRESSION MICRODISCECTOMY Left 01/05/2015   Procedure: L3-4 DECOMPRESSION MICRODISCECTOMY ON LEFT;  Surgeon: Donaciano Sprang, MD;  Location: [facility] OR;  Service: Orthopedics;  Laterality: Left;  left Lumbar 3-4   POLYPECTOMY     turmor ear Right 09/2013   urologic sugery with cystocele/rectocele repairs and sling  03/2007   Dr. Andra    Prior to Admission medications   Medication Sig Start Date End Date Taking? Authorizing Provider  acetaminophen  (TYLENOL ) 325 MG tablet Take 650 mg by mouth as needed.   Yes [provider]  amlodipine -olmesartan  (AZOR ) 10-20 MG tablet Take 1 tablet by mouth daily. 07/23/23  Yes Job Lukes, PA  pantoprazole  (PROTONIX ) 40 MG  tablet Take 1 tablet by mouth once daily 11/12/23  Yes Worley, Samantha, [place]  amoxicillin -clavulanate (AUGMENTIN ) 875-125 MG tablet Take 1 tablet by mouth 2 (two) times daily. Patient not taking: Reported on 12/30/2023 12/02/23   Job Lukes, PA    Current Facility-Administered Medications  Medication Dose Route Frequency Provider Last Rate Last Admin   acetaminophen  (TYLENOL ) tablet 650 mg  650 mg Oral Q6H PRN Melvin, Alexander B, MD   650 mg at 12/31/23 01-31-2026   Or   acetaminophen  (TYLENOL ) suppository  650 mg  650 mg Rectal Q6H PRN Seena Marsa NOVAK, MD       amLODipine  (NORVASC ) tablet 10 mg  10 mg Oral Daily Merilee Linsey I, [facility]       And   irbesartan  (AVAPRO ) tablet 150 mg  150 mg Oral Daily Mitchell, Madeline I, RPH       melatonin tablet 3 mg  3 mg Oral QHS Sundil, Subrina, MD   3 mg at 12/31/23 02/01/32   pantoprazole  (PROTONIX ) injection 40 mg  40 mg Intravenous Q12H Melvin, Alexander B, MD   40 mg at 12/31/23 2123/02/01   sodium chloride  flush (NS) 0.9 % injection 3 mL  3 mL Intravenous Q12H Seena Marsa NOVAK, MD   3 mL at 12/31/23 2123-02-01    Allergies as of 12/30/2023 - Review Complete 12/30/2023  Allergen Reaction Noted   Ciprocin-fluocin-procin [fluocinolone] Other (See Comments) 09/10/2016   Ciprofloxacin  Other (See Comments) 09/04/2020   Codeine Other (See Comments) 02/14/2023   Sulfamethoxazole -trimethoprim  Nausea Only 09/04/2020   Bupropion Rash 10/21/2016    Family History  Problem Relation Age of Onset   Breast cancer Sister 50   Memory loss Sister    Dementia Mother    Heart attack Mother    CVA Father    Stroke Father    Colon cancer Neg Hx    Esophageal cancer Neg Hx    Pancreatic cancer Neg Hx    Rectal cancer Neg Hx    Stomach cancer Neg Hx    Colon polyps Neg Hx     Social History   Socioeconomic History   Marital status: Widowed    Spouse name: Not on file   Number of children: 2   Years of education: 61   Highest education level: Some college, no degree  Occupational History   Occupation: Retired  Tobacco Use   Smoking status: Former    Current packs/day: 0.00    Average packs/day: 1 pack/day for 30.0 years (30.0 ttl pk-yrs)    Types: Cigarettes    Start date: 06/17/1954    Quit date: 06/17/1984    Years since quitting: 39.5   Smokeless tobacco: Never  Vaping Use   Vaping status: Never Used  Substance and Sexual Activity   Alcohol use: No    Alcohol/week: 0.0 standard drinks of alcohol   Drug use: No   Sexual activity: Not Currently     Birth control/protection: Post-menopausal    Comment: 1st intercourse 15 yo-2 partners  Other Topics Concern   Not on file  Social History Narrative   Married (2nd marriage; 1st husband died at age 53) married 01-31-85 29 years in 05/2014. 2 sons from first marriage, 3 from 2nd (step). 15 grandchildren. Retired in 2007/02/01 from [employer]. Hobbies: reading, golf, family time.   Marcey is POA   Right handed   Lives in single story home with husband   Social Drivers of Health   Financial Resource Strain: Low Risk  (12/31/2021)  Overall Financial Resource Strain (CARDIA)    Difficulty of Paying Living Expenses: Not hard at all  Food Insecurity: No Food Insecurity (12/31/2023)   Hunger Vital Sign    Worried About Running Out of Food in the Last Year: Never true    Ran Out of Food in the Last Year: Never true  Transportation Needs: No Transportation Needs (12/31/2023)   PRAPARE - Administrator, Civil Service (Medical): No    Lack of Transportation (Non-Medical): No  Physical Activity: Inactive (05/06/2023)   Exercise Vital Sign    Days of Exercise per Week: 0 days    Minutes of Exercise per Session: 0 min  Stress: No Stress Concern Present (05/06/2023)   [employer] of Occupational Health - Occupational Stress Questionnaire    Feeling of Stress : Not at all  Social Connections: Socially Isolated (12/31/2023)   Social Connection and Isolation Panel    Frequency of Communication with Friends and Family: More than three times a week    Frequency of Social Gatherings with Friends and Family: Once a week    Attends Religious Services: Never    Database administrator or Organizations: No    Attends Banker Meetings: Never    Marital Status: Widowed  Intimate Partner Violence: Not At Risk (12/31/2023)   Humiliation, Afraid, Rape, and Kick questionnaire    Fear of Current or Ex-Partner: No    Emotionally Abused: No    Physically Abused: No    Sexually Abused: No     Review of Systems:  All other review of systems negative except as mentioned in the HPI.  Physical Exam: Vital signs BP 133/65 (BP Location: Left Arm)   Pulse 78   Temp 98.1 F (36.7 C) (Oral)   Resp 16   Ht 5' 4 (1.626 m)   Wt 71.6 kg   LMP  (LMP Unknown)   SpO2 94%   BMI 27.09 kg/m   General:   Elderly woman in NAD Lungs:  Clear throughout to auscultation.   Heart:  Regular rate and rhythm; no murmurs, clicks, rubs,  or gallops. Abdomen:  Soft, nontender and nondistended. Normal bowel sounds.   Neuro/Psych:  Normal mood and affect. A and O x 3  Inocente Hausen, MD [facility]

## 2024-01-01 NOTE — Assessment & Plan Note (Signed)
 01-01-2024 EGD today shows gastritis. No ulcer. Leave on bid PPI. F/u with GI for biopsy results. Pt has eaten 2 solid meals today without difficulty. Has already walked unit several times without difficulty. Ready for DC.

## 2024-01-01 NOTE — Plan of Care (Signed)
  Problem: Education: Goal: Knowledge of General Education information will improve Description: Including pain rating scale, medication(s)/side effects and non-pharmacologic comfort measures Outcome: Adequate for Discharge   Problem: Health Behavior/Discharge Planning: Goal: Ability to manage health-related needs will improve Outcome: Adequate for Discharge   Problem: Clinical Measurements: Goal: Ability to maintain clinical measurements within normal limits will improve Outcome: Adequate for Discharge Goal: Will remain free from infection Outcome: Adequate for Discharge Goal: Diagnostic test results will improve Outcome: Adequate for Discharge Goal: Respiratory complications will improve Outcome: Adequate for Discharge Goal: Cardiovascular complication will be avoided Outcome: Adequate for Discharge   Problem: Activity: Goal: Risk for activity intolerance will decrease Outcome: Adequate for Discharge   Problem: Nutrition: Goal: Adequate nutrition will be maintained Outcome: Adequate for Discharge   Problem: Coping: Goal: Level of anxiety will decrease Outcome: Adequate for Discharge   Problem: Elimination: Goal: Will not experience complications related to bowel motility Outcome: Adequate for Discharge Goal: Will not experience complications related to urinary retention Outcome: Adequate for Discharge   Problem: Pain Managment: Goal: General experience of comfort will improve and/or be controlled Outcome: Adequate for Discharge   Problem: Safety: Goal: Ability to remain free from injury will improve Outcome: Adequate for Discharge   Problem: Skin Integrity: Goal: Risk for impaired skin integrity will decrease Outcome: Adequate for Discharge   Problem: Education: Goal: Knowledge of condition and prescribed therapy will improve Outcome: Adequate for Discharge   Problem: Cardiac: Goal: Will achieve and/or maintain adequate cardiac output Outcome: Adequate for  Discharge   Problem: Physical Regulation: Goal: Complications related to the disease process, condition or treatment will be avoided or minimized Outcome: Adequate for Discharge

## 2024-01-01 NOTE — Subjective & Objective (Signed)
 Pt seen and examined. EGD shows gastritis. Biopsy taken.  Pt has eaten 2 solid meals today without difficulty. Has already walked unit several times without difficulty. Ready for DC.  Pt has caregiver. Lucienne is with her. Caregiver states she will be with patient more often now after discharge.

## 2024-01-01 NOTE — Assessment & Plan Note (Signed)
 01-01-2024 no syncope with walking. Echo performed. Results pending. Pt can f/u with PCP for echo results.

## 2024-01-01 NOTE — Assessment & Plan Note (Signed)
 01-01-2024 stable. Continue Azor  at home.

## 2024-01-01 NOTE — Assessment & Plan Note (Signed)
 01-01-2024 stable.

## 2024-01-01 NOTE — Discharge Instructions (Signed)

## 2024-01-01 NOTE — Op Note (Signed)
 [facility] Patient Name: Krystal Craig Procedure Date : 01/01/2024 MRN: [id number] Attending MD: Inocente Hausen , MD, [id number] Date of Birth: April 27, 1933 CSN: [id number] Age: 89 Admit Type: Inpatient Procedure:                Upper GI endoscopy Indications:              Coffee-ground emesis Providers:                Inocente Hausen, MD, Darleene Bare, RN, Corene Southgate,                            Technician Referring MD:              Medicines:                Monitored Anesthesia Care Complications:            No immediate complications. Estimated blood loss:                            Minimal. Estimated Blood Loss:     Estimated blood loss was minimal. Procedure:                Pre-Anesthesia Assessment:                           - Prior to the procedure, a History and Physical                            was performed, and patient medications and                            allergies were reviewed. The patient's tolerance of                            previous anesthesia was also reviewed. The risks                            and benefits of the procedure and the sedation                            options and risks were discussed with the patient.                            All questions were answered, and informed consent                            was obtained. Prior Anticoagulants: The patient has                            taken no anticoagulant or antiplatelet agents. ASA                            Grade Assessment: II - A patient with mild systemic  disease. After reviewing the risks and benefits,                            the patient was deemed in satisfactory condition to                            undergo the procedure.                           After obtaining informed consent, the endoscope was                            passed under direct vision. Throughout the                            procedure, the patient's blood pressure, pulse,  and                            oxygen saturations were monitored continuously. The                            [id number]-H190 ([id number]) Olympus endoscope was introduced                            through the mouth, and advanced to the second part                            of duodenum. The upper GI endoscopy was                            accomplished without difficulty. The patient                            tolerated the procedure well. Scope In: Scope Out: Findings:      The upper third of the esophagus and middle third of the esophagus were       normal.      The Z-line was irregular.      Scattered mild inflammation characterized by erythema was found in the       gastric body, in the gastric antrum and in the prepyloric region of the       stomach. Biopsies were taken with a cold forceps for Helicobacter pylori       testing.      A small hiatal hernia was present.      The duodenal bulb and second portion of the duodenum were normal.      No evidence of active bleeding or stigmata of recent bleeding seen       during today's exam. Impression:               - Normal upper third of esophagus and middle third                            of esophagus.                           -  Z-line irregular.                           - Gastritis. Biopsied. Evaluate for H. pylori.                            Gastritis may explain the patient's recent episode                            of coffee-ground emesis.                           - Small hiatal hernia.                           - Normal duodenal bulb and second portion of the                            duodenum. Recommendation:           - Return patient to hospital ward for ongoing care.                           - Await pathology results.                           - Recommend pantoprazole  or omeprazole  40 mg p.o.                            twice daily x 30 days then decrease to once daily                            dosing for treatment of  gastritis.                           - Patient may resume regular diet. Procedure Code(s):        --- Professional ---                           [phone], Esophagogastroduodenoscopy, flexible,                            transoral; with biopsy, single or multiple Diagnosis Code(s):        --- Professional ---                           K22.89, Other specified disease of esophagus                           K29.70, Gastritis, unspecified, without bleeding                           K44.9, Diaphragmatic hernia without obstruction or                            gangrene  K92.0, Hematemesis CPT copyright 2022 [employer]. All rights reserved. The codes documented in this report are preliminary and upon coder review may  be revised to meet current compliance requirements. Inocente Hausen, MD 01/01/2024 9:10:37 AM This report has been signed electronically. Number of Addenda: 0

## 2024-01-01 NOTE — Transfer of Care (Signed)
 Immediate Anesthesia Transfer of Care Note  Patient: Krystal Craig  Procedure(s) Performed: EGD (ESOPHAGOGASTRODUODENOSCOPY)  Patient Location: PACU and Endoscopy Unit  Anesthesia Type:MAC  Level of Consciousness: sedated  Airway & Oxygen Therapy: Patient Spontanous Breathing and Patient connected to nasal cannula oxygen  Post-op Assessment: Report given to RN and Post -op Vital signs reviewed and stable  Post vital signs: Reviewed and stable  Last Vitals:  Vitals Value Taken Time  BP    Temp    Pulse    Resp    SpO2      Last Pain:  Vitals:   01/01/24 0721  TempSrc: Temporal  PainSc: 0-No pain         Complications: No notable events documented.

## 2024-01-01 NOTE — TOC CM/SW Note (Addendum)
 Transition of Care [facility]) - Inpatient Brief Assessment   Patient Details  Name: Krystal Craig MRN: [id number] Date of Birth: 26-Mar-1933  [facility] of [facility] CM/SW Contact:    Lauraine FORBES Saa, LCSW Phone Number: 01/01/2024, 11:51 AM   Clinical Narrative:  11:51 AM Per chart review, patient resides at home alone. Patient has a PCP and insurance. Patient does not have SNF history. Patient has DME (rolling walker) history with [facility]. Patient has HH history with [facility] and [facility]. Patient's preferred pharmacy's are [facility] [phone] and [facility].  CSW provided SDOH (social connections) resources. No TOC needs were identified at this time. TOC will continue to follow and be available to assist.  Transition of Care Asessment: Insurance and Status: Insurance coverage has been reviewed Patient has primary care physician: Yes Home environment has been reviewed: Private Residence Prior level of function:: N/A Prior/Current Home Services: No current home services (Has HH/DME history) Social Drivers of Health Review: SDOH reviewed interventions complete Readmission risk has been reviewed: Yes Transition of care needs: no transition of care needs at this time

## 2024-01-01 NOTE — Hospital Course (Addendum)
 HPI: Krystal Craig is a 89 y.o. female with medical history significant of hypertension, hyperlipidemia, GERD, Barrett's esophagus, CKD 3, anxiety, depression, neurocognitive disorder, memory impairment, lung cancer s/p resection presenting with episode of near syncope and coffee-ground emesis.   History obtained with assistance of chart review and family.  Patient reportedly had an episode of near syncope around 12:30 PM yesterday.  She had a prodrome of lightheadedness and then fell to the ground.  She did not lose consciousness.  After the fall she had an episode of coffee-ground emesis per family    Patient denies fevers, chills, chest pain, shortness breath, abdominal pain, constipation, diarrhea    ED Course: Vital signs in the ED notable for blood pressure in the 100s-180s systolic, respiratory rate in the teens-20s.   Lab workup notable for CMP with bicarb 21, creatinine stable 1.11, glucose 100.  CBC with hemoglobin stable at 11.6.  Troponin T trend 159, 166, 162.  proBNP normal.  D-dimer age-adjusted normal at 0.80.     Chest x-ray showed no acute abnormality.  CT head showed no acute abnormality.     Patient received IV PPI, amlodipine , 500 cc IV fluid followed by continuous fluids in the ED.  Significant Events: Admitted 12/30/2023 for upper GI bleeding   Admission Labs: WBC 6.9, HgB 11.6, plt 319 D-dimer 0.8 Na 137, K 4.1, CO2 of 21, BUN 16, Scr 1.11, glu 100 Pro-BNP 269  Admission Imaging Studies: CXR No acute cardiopulmonary abnormality  CT head Atrophy, chronic microvascular disease. No acute intracranial abnormality  Significant Labs:   Significant Imaging Studies:   Antibiotic Therapy: Anti-infectives (From admission, onward)    None       Procedures: 01-01-2024 EGD  Consultants: GI - [place]

## 2024-01-01 NOTE — Anesthesia Preprocedure Evaluation (Addendum)
 Anesthesia Evaluation  Patient identified by MRN, date of birth, ID band Patient awake    Reviewed: Allergy & Precautions, NPO status , Patient's Chart, lab work & pertinent test results  History of Anesthesia Complications (+) PONV and history of anesthetic complications  Airway Mallampati: II  TM Distance: >3 FB Neck ROM: Full    Dental  (+) Dental Advisory Given   Pulmonary neg shortness of breath, neg sleep apnea, neg COPD, neg recent URI, former smoker   breath sounds clear to auscultation       Cardiovascular hypertension,  Rhythm:Regular     Neuro/Psych  PSYCHIATRIC DISORDERS Anxiety Depression     Neuromuscular disease    GI/Hepatic hiatal hernia,GERD  ,,  Endo/Other    Renal/GU Renal disease     Musculoskeletal  (+) Arthritis ,    Abdominal   Peds  Hematology  (+) Blood dyscrasia, anemia   Anesthesia Other Findings   Reproductive/Obstetrics                              Anesthesia Physical Anesthesia Plan  ASA: 3  Anesthesia Plan: MAC   Post-op Pain Management: Minimal or no pain anticipated   Induction: Intravenous  PONV Risk Score and Plan: 3 and Propofol  infusion and Treatment may vary due to age or medical condition  Airway Management Planned: Nasal Cannula, Natural Airway and Simple Face Mask  Additional Equipment: None  Intra-op Plan:   Post-operative Plan:   Informed Consent: I have reviewed the patients History and Physical, chart, labs and discussed the procedure including the risks, benefits and alternatives for the proposed anesthesia with the patient or authorized representative who has indicated his/her understanding and acceptance.     Dental advisory given  Plan Discussed with: CRNA  Anesthesia Plan Comments:          Anesthesia Quick Evaluation

## 2024-01-01 NOTE — Discharge Summary (Signed)
 [facility] Phys[facility] Discharge Summary   Patient name: Krystal Craig  Admit date:     12/30/2023  Discharge date: 01/01/2024  Attending Physician: ARTHEA CHILD [[id number]]  Discharge Physician: Camellia Door   PCP: Job Lukes, PA  Admitted From: Home   Disposition:  Home  Recommendations for Outpatient Follow-up:  Follow up with PCP in 1-2 weeks F/U with GI in 1 week Please follow up on the following pending results: echo  Home Health:No Equipment/Devices: None    Discharge Condition:Stable CODE STATUS:FULL Diet recommendation: Heart Healthy Fluid Restriction: None  Hospital Summary: HPI: Krystal Craig is a 89 y.o. female with medical history significant of hypertension, hyperlipidemia, GERD, Barrett's esophagus, CKD 3, anxiety, depression, neurocognitive disorder, memory impairment, lung cancer s/p resection presenting with episode of near syncope and coffee-ground emesis.   History obtained with assistance of chart review and family.  Patient reportedly had an episode of near syncope around 12:30 PM yesterday.  She had a prodrome of lightheadedness and then fell to the ground.  She did not lose consciousness.  After the fall she had an episode of coffee-ground emesis per family    Patient denies fevers, chills, chest pain, shortness breath, abdominal pain, constipation, diarrhea    ED Course: Vital signs in the ED notable for blood pressure in the 100s-180s systolic, respiratory rate in the teens-20s.   Lab workup notable for CMP with bicarb 21, creatinine stable 1.11, glucose 100.  CBC with hemoglobin stable at 11.6.  Troponin T trend 159, 166, 162.  proBNP normal.  D-dimer age-adjusted normal at 0.80.     Chest x-ray showed no acute abnormality.  CT head showed no acute abnormality.     Patient received IV PPI, amlodipine , 500 cc IV fluid followed by continuous fluids in the ED.  Significant Events: Admitted 12/30/2023 for upper GI  bleeding   Admission Labs: WBC 6.9, HgB 11.6, plt 319 D-dimer 0.8 Na 137, K 4.1, CO2 of 21, BUN 16, Scr 1.11, glu 100 Pro-BNP 269  Admission Imaging Studies: CXR No acute cardiopulmonary abnormality  CT head Atrophy, chronic microvascular disease. No acute intracranial abnormality  Significant Labs:   Significant Imaging Studies:   Antibiotic Therapy: Anti-infectives (From admission, onward)    None       Procedures:   Consultants: GI [facility] Course by Problem: * UGIB (upper gastrointestinal bleed) 01-01-2024 EGD today shows gastritis. No ulcer. Leave on bid PPI. F/u with GI for biopsy results. Pt has eaten 2 solid meals today without difficulty. Has already walked unit several times without difficulty. Ready for DC.  Gastritis 01-01-2024 EGD today shows gastritis. No ulcer. Leave on bid PPI. F/u with GI for biopsy results. Pt has eaten 2 solid meals today without difficulty. Has already walked unit several times without difficulty. Ready for DC.  Coffee ground emesis 01-01-2024 EGD today shows gastritis. No ulcer. Leave on bid PPI. F/u with GI for biopsy results. Pt has eaten 2 solid meals today without difficulty. Has already walked unit several times without difficulty. Ready for DC.  Near syncope 01-01-2024 no syncope with walking. Echo performed. Results pending. Pt can f/u with PCP for echo results.  GERD (gastroesophageal reflux disease) 01-01-2024 EGD today shows gastritis. No ulcer. Leave on bid PPI. F/u with GI for biopsy results. Pt has eaten 2 solid meals today without difficulty. Has already walked unit several times without difficulty. Ready for DC.  Essential hypertension 01-01-2024 stable. Continue Azor  at home.  Major  neurocognitive disorder (HCC) 01-01-2024 stable.  Barrett's esophagus with dysplasia 01-01-2024 stable.  Situational anxiety 01-01-2024 stable.  CKD stage 3a, GFR 45-59 ml/min (HCC) 01-01-2024 stable. Baseline  sCr 1.15  Pure hypercholesterolemia 01-01-2024 stable.    Discharge Diagnoses:  Principal Problem:   UGIB (upper gastrointestinal bleed) Active Problems:   GERD (gastroesophageal reflux disease)   Near syncope   Coffee ground emesis   Gastritis   Pure hypercholesterolemia   CKD stage 3a, GFR 45-59 ml/min (HCC)   Situational anxiety   Barrett's esophagus with dysplasia   Major neurocognitive disorder [facility])   Essential hypertension   Discharge Instructions  Discharge Instructions     Call MD for:  difficulty breathing, headache or visual disturbances   Complete by: As directed    Call MD for:  extreme fatigue   Complete by: As directed    Call MD for:  hives   Complete by: As directed    Call MD for:  persistant dizziness or light-headedness   Complete by: As directed    Call MD for:  persistant nausea and vomiting   Complete by: As directed    Call MD for:  redness, tenderness, or signs of infection (pain, swelling, redness, odor or green/yellow discharge around incision site)   Complete by: As directed    Call MD for:  severe uncontrolled pain   Complete by: As directed    Call MD for:  temperature >100.4   Complete by: As directed    Diet - low sodium heart healthy   Complete by: As directed    Discharge instructions   Complete by: As directed    1. Follow up with your primary care provider in 1-2 weeks following discharge from hospital. 2. Call GI office next week for appointment.   Increase activity slowly   Complete by: As directed       Allergies as of 01/01/2024       Reactions   Ciprocin-fluocin-procin [fluocinolone] Other (See Comments)   Leg and arm numbness   Ciprofloxacin  Other (See Comments)   Extreme weakness Other Reaction(s): Extreme weakness   Codeine Other (See Comments)   Chest pain/tightness Other Reaction(s): Tightness in chest   Sulfamethoxazole -trimethoprim  Nausea Only   Bupropion Rash        Medication List     STOP  taking these medications    amoxicillin -clavulanate 875-125 MG tablet Commonly known as: AUGMENTIN        TAKE these medications    acetaminophen  325 MG tablet Commonly known as: TYLENOL  Take 650 mg by mouth as needed.   amlodipine -olmesartan  10-20 MG tablet Commonly known as: AZOR  Take 1 tablet by mouth daily.   pantoprazole  40 MG tablet Commonly known as: PROTONIX  Take 1 tablet (40 mg total) by mouth 2 (two) times daily. What changed: when to take this        Allergies  Allergen Reactions   Ciprocin-Fluocin-Procin [Fluocinolone] Other (See Comments)    Leg and arm numbness   Ciprofloxacin  Other (See Comments)    Extreme weakness  Other Reaction(s): Extreme weakness   Codeine Other (See Comments)    Chest pain/tightness  Other Reaction(s): Tightness in chest   Sulfamethoxazole -Trimethoprim  Nausea Only   Bupropion Rash    Discharge Exam: Vitals:   01/01/24 0920 01/01/24 1256  BP: 116/71 (!) 143/68  Pulse: 72 91  Resp: 17 19  Temp:  (!) 97.5 F (36.4 C)  SpO2: 95% 96%    Physical Exam Vitals reviewed.  Constitutional:  General: She is not in acute distress.    Appearance: She is not ill-appearing, toxic-appearing or diaphoretic.  HENT:     Head: Normocephalic and atraumatic.     Nose: Nose normal.  Eyes:     General: No scleral icterus. Cardiovascular:     Rate and Rhythm: Normal rate and regular rhythm.  Pulmonary:     Effort: Pulmonary effort is normal.     Breath sounds: Normal breath sounds.  Abdominal:     General: Bowel sounds are normal. There is no distension.     Palpations: Abdomen is soft.  Musculoskeletal:     Right lower leg: No edema.     Left lower leg: No edema.  Skin:    General: Skin is warm and dry.     Capillary Refill: Capillary refill takes less than 2 seconds.  Neurological:     Mental Status: She is alert and oriented to person, place, and time.     The results of significant diagnostics from this  hospitalization (including imaging, microbiology, ancillary and laboratory) are listed below for reference.    Microbiology: No results found for this or any previous visit (from the past 240 hours).   Labs: BNP (last 3 results) No results for input(s): BNP in the last 8760 hours. Basic Metabolic Panel: Recent Labs  Lab 12/30/23 1508 12/31/23 2123 01/01/24 0218  NA 137 136 136  K 4.1 3.8 4.4  CL 104 103 104  CO2 21* 21* 24  GLUCOSE 100* 108* 104*  BUN 16 10 10   CREATININE 1.11* 1.10* 1.15*  CALCIUM  9.9 9.0 8.8*   Liver Function Tests: Recent Labs  Lab 12/30/23 1508 12/31/23 2123 01/01/24 0218  AST 32 31 24  ALT 31 31 26   ALKPHOS 90 82 68  BILITOT 0.7 1.5* 1.4*  PROT 7.2 6.9 5.9*  ALBUMIN 4.2 3.6 3.1*   CBC: Recent Labs  Lab 12/30/23 1508 12/31/23 2123 01/01/24 1035  WBC 6.9 8.3 6.6  NEUTROABS 4.8  --   --   HGB 11.6* 12.3 12.8  HCT 36.7 37.9 40.3  MCV 84.2 82.8 87.0  PLT 319 334 312   CBG: Recent Labs  Lab 12/30/23 1435 01/01/24 0417  GLUCAP 88 101*   D-Dimer Recent Labs    12/30/23 1508  DDIMER 0.80*   Sepsis Labs Recent Labs  Lab 12/30/23 1508 12/31/23 2123 01/01/24 1035  WBC 6.9 8.3 6.6    Procedures/Studies: CT HEAD WO CONTRAST Result Date: 12/30/2023 CLINICAL DATA:  Syncope/presyncope.  Fall.  Dizziness. EXAM: CT HEAD WITHOUT CONTRAST TECHNIQUE: Contiguous axial images were obtained from the base of the skull through the vertex without intravenous contrast. RADIATION DOSE REDUCTION: This exam was performed according to the departmental dose-optimization program which includes automated exposure control, adjustment of the mA and/or kV according to patient size and/or use of iterative reconstruction technique. COMPARISON:  None Available. FINDINGS: Brain: There is atrophy and chronic small vessel disease changes. No acute intracranial abnormality. Specifically, no hemorrhage, hydrocephalus, mass lesion, acute infarction, or significant  intracranial injury. Vascular: No hyperdense vessel or unexpected calcification. Skull: No acute calvarial abnormality. Sinuses/Orbits: No acute findings. Mucous retention cyst within the left maxillary sinus. Other: None IMPRESSION: Atrophy, chronic microvascular disease. No acute intracranial abnormality. Electronically Signed   By: Franky Crease M.D.   On: 12/30/2023 15:46   DG Chest Port 1 View Result Date: 12/30/2023 CLINICAL DATA:  near syncope EXAM: PORTABLE CHEST - 1 VIEW COMPARISON:  September 28, 2021 FINDINGS: Streaky atelectasis  in the left lung base. No focal airspace consolidation, pleural effusion, or pneumothorax. No cardiomegaly. Tortuous aorta with aortic atherosclerosis. No acute fracture or destructive lesions. Multilevel thoracic osteophytosis. IMPRESSION: No acute cardiopulmonary abnormality. Electronically Signed   By: Rogelia Myers M.D.   On: 12/30/2023 15:36   Procedure:                Upper GI endoscopy Indications:              Coffee-ground emesis Providers:                Inocente Hausen, MD, Darleene Bare, RN, Corene Southgate,                            Technician Referring MD:              Medicines:                Monitored Anesthesia Care Complications:            No immediate complications. Estimated blood loss:                            Minimal. Estimated Blood Loss:     Estimated blood loss was minimal. Procedure:                Pre-Anesthesia Assessment:                           - Prior to the procedure, a History and Physical                            was performed, and patient medications and                            allergies were reviewed. The patient's tolerance of                            previous anesthesia was also reviewed. The risks                            and benefits of the procedure and the sedation                            options and risks were discussed with the patient.                            All questions were answered, and informed  consent                            was obtained. Prior Anticoagulants: The patient has                            taken no anticoagulant or antiplatelet agents. ASA                            Grade Assessment: II - A patient with mild systemic  disease. After reviewing the risks and benefits,                            the patient was deemed in satisfactory condition to                            undergo the procedure.                           After obtaining informed consent, the endoscope was                            passed under direct vision. Throughout the                            procedure, the patient's blood pressure, pulse, and                            oxygen saturations were monitored continuously. The                            [id number]-H190 ([id number]) Olympus endoscope was introduced                            through the mouth, and advanced to the second part                            of duodenum. The upper GI endoscopy was                            accomplished without difficulty. The patient                            tolerated the procedure well. Scope In: Scope Out: Findings:      The upper third of the esophagus and middle third of the esophagus were       normal.      The Z-line was irregular.      Scattered mild inflammation characterized by erythema was found in the       gastric body, in the gastric antrum and in the prepyloric region of the       stomach. Biopsies were taken with a cold forceps for Helicobacter pylori       testing.      A small hiatal hernia was present.      The duodenal bulb and second portion of the duodenum were normal.      No evidence of active bleeding or stigmata of recent bleeding seen       during today's exam. Impression:               - Normal upper third of esophagus and middle third                            of esophagus.                           -  Z-line irregular.                           -  Gastritis. Biopsied. Evaluate for H. pylori.                            Gastritis may explain the patient's recent episode                            of coffee-ground emesis.                           - Small hiatal hernia.                           - Normal duodenal bulb and second portion of the                            duodenum. Recommendation:           - Return patient to hospital ward for ongoing care.                           - Await pathology results.                           - Recommend pantoprazole  or omeprazole  40 mg p.o.                            twice daily x 30 days then decrease to once daily                            dosing for treatment of gastritis.                           - Patient may resume regular diet. Time coordinating discharge: 55 mins  SIGNED:  Camellia Door, DO [facility] 01/01/24, 1:52 PM

## 2024-01-01 NOTE — Progress Notes (Signed)
 PROGRESS NOTE    Krystal Craig  FMW:[id number] DOB: 1932-10-22 DOA: 12/30/2023 PCP: Krystal Lukes, PA  Subjective: Pt seen and examined. EGD shows gastritis. Biopsy taken.  Pt has eaten 2 solid meals today without difficulty. Has already walked unit several times without difficulty. Ready for DC.  Pt has caregiver. Krystal Craig is with her. Caregiver states she will be with patient more often now after discharge.   Hospital Course: HPI: Krystal Craig is a 89 y.o. female with medical history significant of hypertension, hyperlipidemia, GERD, Barrett's esophagus, CKD 3, anxiety, depression, neurocognitive disorder, memory impairment, lung cancer s/p resection presenting with episode of near syncope and coffee-ground emesis.   History obtained with assistance of chart review and family.  Patient reportedly had an episode of near syncope around 12:30 PM yesterday.  She had a prodrome of lightheadedness and then fell to the ground.  She did not lose consciousness.  After the fall she had an episode of coffee-ground emesis per family    Patient denies fevers, chills, chest pain, shortness breath, abdominal pain, constipation, diarrhea    ED Course: Vital signs in the ED notable for blood pressure in the 100s-180s systolic, respiratory rate in the teens-20s.   Lab workup notable for CMP with bicarb 21, creatinine stable 1.11, glucose 100.  CBC with hemoglobin stable at 11.6.  Troponin T trend 159, 166, 162.  proBNP normal.  D-dimer age-adjusted normal at 0.80.     Chest x-ray showed no acute abnormality.  CT head showed no acute abnormality.     Patient received IV PPI, amlodipine , 500 cc IV fluid followed by continuous fluids in the ED.  Significant Events: Admitted 12/30/2023 for upper GI bleeding   Admission Labs: WBC 6.9, HgB 11.6, plt 319 D-dimer 0.8 Na 137, K 4.1, CO2 of 21, BUN 16, Scr 1.11, glu 100 Pro-BNP 269  Admission Imaging Studies: CXR No acute cardiopulmonary  abnormality  CT head Atrophy, chronic microvascular disease. No acute intracranial abnormality  Significant Labs:   Significant Imaging Studies:   Antibiotic Therapy: Anti-infectives (From admission, onward)    None       Procedures:   Consultants: GI - [place]    Assessment and Plan: * UGIB (upper gastrointestinal bleed) 01-01-2024 EGD today shows gastritis. No ulcer. Leave on bid PPI. F/u with GI for biopsy results. Pt has eaten 2 solid meals today without difficulty. Has already walked unit several times without difficulty. Ready for DC.  Gastritis 01-01-2024 EGD today shows gastritis. No ulcer. Leave on bid PPI. F/u with GI for biopsy results. Pt has eaten 2 solid meals today without difficulty. Has already walked unit several times without difficulty. Ready for DC.  Coffee ground emesis 01-01-2024 EGD today shows gastritis. No ulcer. Leave on bid PPI. F/u with GI for biopsy results. Pt has eaten 2 solid meals today without difficulty. Has already walked unit several times without difficulty. Ready for DC.  Near syncope 01-01-2024 no syncope with walking. Echo performed. Results pending. Pt can f/u with PCP for echo results.  GERD (gastroesophageal reflux disease) 01-01-2024 EGD today shows gastritis. No ulcer. Leave on bid PPI. F/u with GI for biopsy results. Pt has eaten 2 solid meals today without difficulty. Has already walked unit several times without difficulty. Ready for DC.  Essential hypertension 01-01-2024 stable. Continue Azor  at home.  Major neurocognitive disorder (HCC) 01-01-2024 stable.  Barrett's esophagus with dysplasia 01-01-2024 stable.  Situational anxiety 01-01-2024 stable.  CKD stage 3a, GFR 45-59 ml/min (HCC) 01-01-2024  stable. Baseline sCr 1.15  Pure hypercholesterolemia 01-01-2024 stable.   DVT prophylaxis: SCDs Start: 12/31/23 1532    Code Status: Full Code Family Communication: discussed with pt and her caregiver  Krystal Craig Disposition Plan: return home Reason for continuing need for hospitalization: stable for DC.  Objective: Vitals:   01/01/24 0908 01/01/24 0912 01/01/24 0920 01/01/24 1256  BP: (!) 89/58 (!) 95/59 116/71 (!) 143/68  Pulse: 75 75 72 91  Resp: (!) 23 (!) 23 17 19   Temp:    (!) 97.5 F (36.4 C)  TempSrc:      SpO2: 94% 93% 95% 96%  Weight:      Height:        Intake/Output Summary (Last 24 hours) at 01/01/2024 1350 Last data filed at 01/01/2024 1249 Gross per 24 hour  Intake 2522.23 ml  Output 0 ml  Net 2522.23 ml   Filed Weights   12/31/23 1627 01/01/24 0500  Weight: 72.6 kg 71.6 kg    Examination:  Physical Exam Vitals reviewed.  Constitutional:      General: She is not in acute distress.    Appearance: She is not ill-appearing, toxic-appearing or diaphoretic.  HENT:     Head: Normocephalic and atraumatic.     Nose: Nose normal.  Eyes:     General: No scleral icterus. Cardiovascular:     Rate and Rhythm: Normal rate and regular rhythm.  Pulmonary:     Effort: Pulmonary effort is normal.     Breath sounds: Normal breath sounds.  Abdominal:     General: Bowel sounds are normal. There is no distension.     Palpations: Abdomen is soft.  Musculoskeletal:     Right lower leg: No edema.     Left lower leg: No edema.  Skin:    General: Skin is warm and dry.     Capillary Refill: Capillary refill takes less than 2 seconds.  Neurological:     Mental Status: She is alert and oriented to person, place, and time.     Data Reviewed: I have personally reviewed following labs and imaging studies  CBC: Recent Labs  Lab 12/30/23 1508 12/31/23 2123 01/01/24 1035  WBC 6.9 8.3 6.6  NEUTROABS 4.8  --   --   HGB 11.6* 12.3 12.8  HCT 36.7 37.9 40.3  MCV 84.2 82.8 87.0  PLT 319 334 312   Basic Metabolic Panel: Recent Labs  Lab 12/30/23 1508 12/31/23 2123 01/01/24 0218  NA 137 136 136  K 4.1 3.8 4.4  CL 104 103 104  CO2 21* 21* 24  GLUCOSE 100* 108*  104*  BUN 16 10 10   CREATININE 1.11* 1.10* 1.15*  CALCIUM  9.9 9.0 8.8*   GFR: Estimated Creatinine Clearance: 32.8 mL/min (A) (by C-G formula based on SCr of 1.15 mg/dL (H)). Liver Function Tests: Recent Labs  Lab 12/30/23 1508 12/31/23 2123 01/01/24 0218  AST 32 31 24  ALT 31 31 26   ALKPHOS 90 82 68  BILITOT 0.7 1.5* 1.4*  PROT 7.2 6.9 5.9*  ALBUMIN 4.2 3.6 3.1*   CBG: Recent Labs  Lab 12/30/23 1435 01/01/24 0417  GLUCAP 88 101*   Radiology Studies: CT HEAD WO CONTRAST Result Date: 12/30/2023 CLINICAL DATA:  Syncope/presyncope.  Fall.  Dizziness. EXAM: CT HEAD WITHOUT CONTRAST TECHNIQUE: Contiguous axial images were obtained from the base of the skull through the vertex without intravenous contrast. RADIATION DOSE REDUCTION: This exam was performed according to the departmental dose-optimization program which includes automated exposure control, adjustment  of the mA and/or kV according to patient size and/or use of iterative reconstruction technique. COMPARISON:  None Available. FINDINGS: Brain: There is atrophy and chronic small vessel disease changes. No acute intracranial abnormality. Specifically, no hemorrhage, hydrocephalus, mass lesion, acute infarction, or significant intracranial injury. Vascular: No hyperdense vessel or unexpected calcification. Skull: No acute calvarial abnormality. Sinuses/Orbits: No acute findings. Mucous retention cyst within the left maxillary sinus. Other: None IMPRESSION: Atrophy, chronic microvascular disease. No acute intracranial abnormality. Electronically Signed   By: Franky Crease M.D.   On: 12/30/2023 15:46   DG Chest Port 1 View Result Date: 12/30/2023 CLINICAL DATA:  near syncope EXAM: PORTABLE CHEST - 1 VIEW COMPARISON:  September 28, 2021 FINDINGS: Streaky atelectasis in the left lung base. No focal airspace consolidation, pleural effusion, or pneumothorax. No cardiomegaly. Tortuous aorta with aortic atherosclerosis. No acute fracture or  destructive lesions. Multilevel thoracic osteophytosis. IMPRESSION: No acute cardiopulmonary abnormality. Electronically Signed   By: Rogelia Myers M.D.   On: 12/30/2023 15:36    Scheduled Meds:  amLODipine   10 mg Oral Daily   And   irbesartan   150 mg Oral Daily   melatonin  3 mg Oral QHS   pantoprazole  (PROTONIX ) IV  40 mg Intravenous Q12H   sodium chloride  flush  3 mL Intravenous Q12H   Continuous Infusions:   LOS: 1 day   Time spent: 55 minutes  Camellia Door, DO  [facility]  01/01/2024, 1:50 PM

## 2024-01-01 NOTE — Assessment & Plan Note (Signed)
 01-01-2024 stable. Baseline sCr 1.15

## 2024-01-02 ENCOUNTER — Telehealth: Payer: Self-pay

## 2024-01-02 NOTE — Transitions of Care (Post Inpatient/ED Visit) (Signed)
   01/02/2024  Name: KAULA KLENKE MRN: [id number] DOB: [birth year]  Today's TOC FU Call Status: Today's TOC FU Call Status:: Successful TOC FU Call Completed TOC FU Call Complete Date: 01/02/24  Attempted to reach the patient regarding the most recent Inpatient/ED visit.  Follow Up Plan: Additional outreach attempts will be made to reach the patient to complete the Transitions of Care (Post Inpatient/ED visit) call.   Signature  Julian Lemmings, LPN [facility] Nurse Health Advisor Direct Dial [phone]

## 2024-01-06 ENCOUNTER — Ambulatory Visit: Payer: Self-pay | Admitting: Physician Assistant

## 2024-01-06 ENCOUNTER — Other Ambulatory Visit: Payer: Self-pay | Admitting: *Deleted

## 2024-01-06 DIAGNOSIS — R931 Abnormal findings on diagnostic imaging of heart and coronary circulation: Secondary | ICD-10-CM

## 2024-01-06 LAB — SURGICAL PATHOLOGY

## 2024-01-06 NOTE — Anesthesia Postprocedure Evaluation (Signed)
 Anesthesia Post Note  Patient: Krystal Craig  Procedure(s) Performed: EGD (ESOPHAGOGASTRODUODENOSCOPY)     Patient location during evaluation: Endoscopy Anesthesia Type: MAC Level of consciousness: awake and alert Pain management: pain level controlled Vital Signs Assessment: post-procedure vital signs reviewed and stable Respiratory status: spontaneous breathing, nonlabored ventilation and respiratory function stable Cardiovascular status: stable and blood pressure returned to baseline Postop Assessment: no apparent nausea or vomiting Anesthetic complications: no   No notable events documented.                Varvara Legault

## 2024-01-09 ENCOUNTER — Encounter: Payer: Self-pay | Admitting: Pharmacist

## 2024-01-09 NOTE — Progress Notes (Signed)
 Pharmacy Quality Measure Review  This patient is appearing on a report for being at risk of failing the adherence measure for hypertension (ACEi/ARB) medications this calendar year.   Medication: amlodipine /olmesartan   Last fill date: 07/23/2023 for 100 day supply  Reviewed recent refill history in Dr Annemarie database. Actual last refill date was 12/02/2023 for 100 day supply. Patient has 1 refill remaining. Next appointment with PCP is 01/15/2024.   BP Readings from Last 3 Encounters:  01/01/24 (!) 143/68  12/02/23 138/64  09/15/23 124/60    Insurance report was not up to date. No action needed at this time.   Madelin Ray, PharmD Clinical Pharmacist [facility]  Population Health [phone]

## 2024-01-15 ENCOUNTER — Ambulatory Visit: Admitting: Physician Assistant

## 2024-01-15 ENCOUNTER — Encounter: Payer: Self-pay | Admitting: Physician Assistant

## 2024-01-15 VITALS — BP 136/70 | HR 71 | Temp 97.3°F | Ht 64.0 in | Wt 158.5 lb

## 2024-01-15 DIAGNOSIS — K2970 Gastritis, unspecified, without bleeding: Secondary | ICD-10-CM | POA: Diagnosis not present

## 2024-01-15 DIAGNOSIS — E860 Dehydration: Secondary | ICD-10-CM | POA: Diagnosis not present

## 2024-01-15 DIAGNOSIS — R55 Syncope and collapse: Secondary | ICD-10-CM

## 2024-01-15 DIAGNOSIS — R931 Abnormal findings on diagnostic imaging of heart and coronary circulation: Secondary | ICD-10-CM

## 2024-01-15 LAB — COMPREHENSIVE METABOLIC PANEL WITH GFR
ALT: 21 U/L (ref 0–35)
AST: 20 U/L (ref 0–37)
Albumin: 4.1 g/dL (ref 3.5–5.2)
Alkaline Phosphatase: 78 U/L (ref 39–117)
BUN: 26 mg/dL — ABNORMAL HIGH (ref 6–23)
CO2: 24 meq/L (ref 19–32)
Calcium: 9.3 mg/dL (ref 8.4–10.5)
Chloride: 108 meq/L (ref 96–112)
Creatinine, Ser: 1.35 mg/dL — ABNORMAL HIGH (ref 0.40–1.20)
GFR: 35.03 mL/min — ABNORMAL LOW (ref >60.00)
Glucose, Bld: 98 mg/dL (ref 70–99)
Potassium: 4.5 meq/L (ref 3.5–5.1)
Sodium: 138 meq/L (ref 135–145)
Total Bilirubin: 0.6 mg/dL (ref 0.2–1.2)
Total Protein: 7.3 g/dL (ref 6.0–8.3)

## 2024-01-15 NOTE — Progress Notes (Signed)
 Krystal Craig is a 89 y.o. female here for a follow up of a pre-existing problem.  History of Present Illness:   Chief Complaint  Patient presents with   Hospitalization Follow-up    Pt was admitted to the hospital 7/15 for Near Syncope episode. Pt has not had any more episodes.    HPI  Discussed the use of AI scribe software for clinical note transcription with the patient, who gave verbal consent to proceed.  History of Present Illness Krystal Craig is an 89 year old female who presents with lightheadedness and a near syncopal episode. She is accompanied by her daughter.  Presyncope and fall - On December 30, 2023, experienced a near syncopal episode with lightheadedness and a fall to the ground without loss of consciousness - No recollection of striking any objects during the fall - No further episodes of lightheadedness or presyncope since hospital discharge  Gastrointestinal symptoms - Single episode of vomiting, possibly bloody, with unclear timing relative to the fall - No current vomiting or gastrointestinal bleeding - No recollection of stomach pain prior to hospitalization - Gastroenterology evaluation revealed irritation and inflammation of the gastric lining - Currently taking pantoprazole  40 mg twice daily   Cardiac evaluation - Elevated troponin levels during hospitalization that remained stable -- suspected to be due to strain from syncope and fall - Echocardiogram showed ejection fraction of 70-75% - Monitored for arrhythmias during hospital stay; no rhythm abnormalities reported to family  Hydration status and appetite - History of dehydration as indicated by kidney markers - Difficulty maintaining adequate oral hydration despite encouragement and flavored water options - Received IV fluids during hospitalization, which improved mental clarity and overall condition - Since discharge, increased appetite and frequent eating, particularly ham sandwiches - No  significant weight gain, weight remains around 154 pounds     Past Medical History:  Diagnosis Date   Allergic rhinitis    Anemia    Aortic atherosclerosis 05/26/2018   Barrett's esophagus with dysplasia 04/07/2019   CKD (chronic kidney disease), stage III (HCC) 02/28/2014   Complication of anesthesia    Degenerative arthritis of lumbar spine 05/26/2018   Evaluation by Dr. Eldonna in 9/19. A/P: Chronic history of low back pain and radicular pain status post lumbar discectomy and laminectomy by Dr. Donaciano Sprang in 2016.  No recent MRI imaging but no real red flag complaints at this point other than she is getting pain in the low back that is problematic over time it just has her at this point where she is limited in certain things she can do in its    Diaphragmatic hernia 06/26/2007   Diverticulosis    GERD (gastroesophageal reflux disease) 06/26/2007   Hiatal hernia    History of bronchitis    History of cancer of lung 2006   2006 20% of left lung resected. Former smoker. Dr. Christi was doing CXR yearly. 09/2017 IMPRESSION: No interval change.  No acute cardiopulmonary findings. Postsurgical change LEFT lung base.   Hyperlipidemia 09/25/2011   IBS (irritable bowel syndrome)    Impacted cerumen of both ears    Insomnia    Insulin resistance 05/31/2018   Irritable bowel syndrome 06/26/2007   Major depression disorder in remission    Major neurocognitive disorder (HCC) 07/30/2019   Osteoarthritis    Osteopenia    PONV (postoperative nausea and vomiting)    Primary osteoarthritis of both first carpometacarpal joints 12/28/2015   Psoriasis    Situational anxiety 11/06/2016   Thoracic  lymphadenopathy 09/25/2011   Vitamin D  deficiency      Social History   Tobacco Use   Smoking status: Former    Current packs/day: 0.00    Average packs/day: 1 pack/day for 30.0 years (30.0 ttl pk-yrs)    Types: Cigarettes    Start date: 06/17/1954    Quit date: 06/17/1984    Years since quitting:  39.6   Smokeless tobacco: Never  Vaping Use   Vaping status: Never Used  Substance Use Topics   Alcohol use: No    Alcohol/week: 0.0 standard drinks of alcohol   Drug use: No    Past Surgical History:  Procedure Laterality Date   ABDOMINAL HYSTERECTOMY  1969   APPENDECTOMY     bilateral shouler operations for impingement syndromes Bilateral 2001   BUNIONECTOMY Left    CATARACT EXTRACTION, BILATERAL Bilateral 2012   CHOLECYSTECTOMY  1976   COLONOSCOPY     ESOPHAGOGASTRODUODENOSCOPY N/A 01/01/2024   Procedure: EGD (ESOPHAGOGASTRODUODENOSCOPY);  Surgeon: Suzann Inocente HERO, MD;  Location: [facility] ENDOSCOPY;  Service: Gastroenterology;  Laterality: N/A;   LAPAROSCOPIC LYSIS INTESTINAL ADHESIONS     left lower lobectomy  11/2004   via VATS and mini-thoractomy for 1.5cm adenoca of lung by Dr. Brantley ILES LAMINECTOMY/DECOMPRESSION MICRODISCECTOMY Left 01/05/2015   Procedure: L3-4 DECOMPRESSION MICRODISCECTOMY ON LEFT;  Surgeon: Donaciano Sprang, MD;  Location: [facility] OR;  Service: Orthopedics;  Laterality: Left;  left Lumbar 3-4   POLYPECTOMY     turmor ear Right 09/2013   urologic sugery with cystocele/rectocele repairs and sling  03/2007   Dr. Andra    Family History  Problem Relation Age of Onset   Breast cancer Sister 12   Memory loss Sister    Dementia Mother    Heart attack Mother    CVA Father    Stroke Father    Colon cancer Neg Hx    Esophageal cancer Neg Hx    Pancreatic cancer Neg Hx    Rectal cancer Neg Hx    Stomach cancer Neg Hx    Colon polyps Neg Hx     Allergies  Allergen Reactions   Ciprocin-Fluocin-Procin [Fluocinolone] Other (See Comments)    Leg and arm numbness   Ciprofloxacin  Other (See Comments)    Extreme weakness  Other Reaction(s): Extreme weakness   Codeine Other (See Comments)    Chest pain/tightness  Other Reaction(s): Tightness in chest   Sulfamethoxazole -Trimethoprim  Nausea Only   Bupropion Rash    Current Medications:   Current  Outpatient Medications:    acetaminophen  (TYLENOL ) 325 MG tablet, Take 650 mg by mouth as needed., Disp: , Rfl:    amlodipine -olmesartan  (AZOR ) 10-20 MG tablet, Take 1 tablet by mouth daily., Disp: 100 tablet, Rfl: 2   pantoprazole  (PROTONIX ) 40 MG tablet, Take 1 tablet (40 mg total) by mouth 2 (two) times daily., Disp: 180 tablet, Rfl: 0   Review of Systems:   Negative unless otherwise specified per HPI.  Vitals:   Vitals:   01/15/24 1104  BP: 136/70  Pulse: 71  Temp: (!) 97.3 F (36.3 C)  TempSrc: Temporal  SpO2: 97%  Weight: 158 lb 8 oz (71.9 kg)  Height: 5' 4 (1.626 m)     Body mass index is 27.21 kg/m.  Physical Exam:   Physical Exam Constitutional:      Appearance: Normal appearance. She is well-developed.  HENT:     Head: Normocephalic and atraumatic.  Eyes:     General: Lids are normal.  Extraocular Movements: Extraocular movements intact.     Conjunctiva/sclera: Conjunctivae normal.  Pulmonary:     Effort: Pulmonary effort is normal.  Musculoskeletal:        General: Normal range of motion.     Cervical back: Normal range of motion and neck supple.  Skin:    General: Skin is warm and dry.  Neurological:     Mental Status: She is alert and oriented to person, place, and time.  Psychiatric:        Attention and Perception: Attention and perception normal.        Mood and Affect: Mood normal.        Behavior: Behavior normal.        Thought Content: Thought content normal.        Judgment: Judgment normal.     Assessment and Plan:   Assessment and Plan Assessment & Plan Recent episode of near-syncope with fall and vomiting Experienced near-syncope with fall and vomiting. Possible hematemesis led to hospital admission. Normal CT head and chest x-ray. Elevated troponin not indicative of acute cardiac event. No further episodes post-discharge. - Ensure follow-up with cardiology for further evaluation of elevated ejection fraction. Dehydration noted,  improved with IV fluids. Current inadequate oral intake contributing to dehydration. - Encourage gradual increase in oral water intake, starting with two bottles per day and increasing as tolerated. - Investigate options for home IV fluid administration after cardiology evaluation. - Recheck kidney function with blood work to assess improvement.  Gastritis with recent upper gastrointestinal irritation Gastritis with stomach lining inflammation identified. Likely contributed to vomiting. Managed with pantoprazole , improving symptoms and appetite. - Continue pantoprazole  as prescribed. - Follow up with gastroenterology next week to assess need for continued medication or dosage adjustment. - Monitor hemoglobin levels. - Ensure follow-up with gastroenterology to assess for ongoing gastrointestinal bleeding.  Abnormal echocardiogram with elevated ejection fraction Echocardiogram showed elevated ejection fraction (70-75%), indicating hyperdynamic cardiac function. Requires further cardiology evaluation. No immediate cardiac symptoms. - Ensure follow-up with cardiology for further evaluation of elevated ejection fraction.   I spent a total of 53 minutes on this visit, today 01/15/24, which included reviewing previous notes from ER 12/30/23-01/01/24, ordering tests, discussing plan of care with patient and using shared-decision making on next steps, refilling medications, and documenting the findings in the note.   [name] Buttner, PA-C

## 2024-01-16 ENCOUNTER — Ambulatory Visit: Payer: Self-pay | Admitting: Physician Assistant

## 2024-01-16 ENCOUNTER — Telehealth: Payer: Self-pay | Admitting: *Deleted

## 2024-01-16 DIAGNOSIS — E860 Dehydration: Secondary | ICD-10-CM

## 2024-01-16 NOTE — Telephone Encounter (Signed)
 Spoke to [place], told her per [name]; Please let [name] know that because this is an order that I have never ordered in the past, I will need to review with my supervising physician. He will not be back into the office until next week. I will need to review with him if this is something that we can do and if so, how to get this process started.   I appreciate her patience!   We will be in touch next week with next steps. [name] verbalized understanding.

## 2024-01-16 NOTE — Telephone Encounter (Signed)
 Pt's daughter [name] said they looked into patient's insurance for IV Fluid infusions and was told, provider needs to write an order and state what is needed and what for, needs PCP code and find Network provider to have infusion done. [name] said we should know in network provider per insurance.

## 2024-01-19 ENCOUNTER — Telehealth: Payer: Self-pay | Admitting: Physician Assistant

## 2024-01-19 NOTE — Telephone Encounter (Signed)
 Spoke to [place] pt's daughter told her per [name], After discussing with my colleagues and exploring local options, I am not able to set up regular saline infusions through insurance.    There can be risks of infection and increased scarring of veins with repeated infusions.   One of my colleagues recommends consideration of Dr. Merita Boone [place] -- a mobile unit that can come to your house to provide saline infusions as needed.   Another consideration is to ask her neurologist to see if there is some way to explore this through them, especially since she felt like it provided significant improvement in dementia symptom(s). [name] verbalized understanding.

## 2024-01-19 NOTE — Telephone Encounter (Signed)
Please see message about appointment

## 2024-01-19 NOTE — Telephone Encounter (Unsigned)
 Copied from CRM [id number]. Topic: General - Other >> Jan 19, 2024 10:12 AM Aleatha C wrote: Reason for CRM: Patient was in the hospital as a a result of the ultrasound she was referred to  [facility] and they can not see her until October 9th and patient daughter [name] is concerned because her heart is beating fast and wanted to know if there is a different place she can be referred to  please call [name] [phone]

## 2024-01-19 NOTE — Telephone Encounter (Signed)
 Krystal Craig, pt is not able to see Cardiology till Oct 9 th. Pt's daughter [name] wants to know if this is okay to wait till then?

## 2024-01-20 NOTE — Telephone Encounter (Signed)
 Spoke to [name], told her [name] said Oct 9th is reasonable wait time to follow up with cardiology. [name] verbalized understanding.

## 2024-01-30 ENCOUNTER — Ambulatory Visit: Admitting: Gastroenterology

## 2024-01-30 NOTE — Progress Notes (Deleted)
 Chief Complaint:hospital follow-up Primary GI Doctor:(previously Dr. Aneita) Dr. Suzann  HPI: 89 y.o. female who has history of GERD, Barrett's esophagus, chronic kidney disease, hypertension, dementia, degenerative disc disease, previously diagnosed diverticulosis, history of IBS, colon polyps.  She is status post cholecystectomy, appendectomy hysterectomy and prior lysis of adhesions.   12/30/23 presented to ED with complaints of dizziness and coffee-ground emesis. Hemoglobin 11.6 down from previous baseline of 13. Denies NSAID use. Not on anticoagulation. Chest x-ray showed no acute abnormality. CT head showed no acute abnormality. EGD in 2021 showed Barrett's esophagus with otherwise normal gastric and duodenal biopsies. Clinical picture concerning for upper GI bleeding.   01/01/24 EGD: Normal upper third of esophagus and middle third of esophagus. Z- line irregular. Gastritis.  Gastritis Marguriete Wootan explain the patient' s recent episode of coffee- ground emesis. Small hiatal hernia. Normal duodenal bulb and second portion of the duodenum. Path:Gastric antral and oxyntic mucosa with mild chronic gastritis. An immunohistochemical stain for Helicobacter pylori organisms is negative  GI history: She last had abdominal imaging in 2023 which did show punctate calcifications throughout the pancreas related to chronic pancreatitis no acute inflammatory stranding, sigmoid colon diverticulosis and surgical changes at the GE junction. She has had previous EGD/Dr. Aneita August 2021 for complaints of nausea and was found to have irregular Z-line, esophagus otherwise normal, small hiatal hernia and otherwise negative exam  Path showed intestinal metaplasia consistent with Barrett's esophagus, otherwise gastric and duodenal biopsies unremarkable Colonoscopy had been done in 2015 for follow-up of colon polyps and she was found to have large internal hemorrhoids and mild diverticulosis.  Interval History  Patient  admits/denies GERD Patient admits/denies dysphagia Patient admits/denies nausea, vomiting, or weight loss  Patient admits/denies altered bowel habits Patient admits/denies abdominal pain Patient admits/denies rectal bleeding   Denies/Admits alcohol Denies/Admits smoking Denies/Admits NSAID use. Denies/Admits they are on blood thinners.  Patients last colonoscopy Patients last EGD  Surgical history:  Patient's family history includes  GI history: She last had abdominal imaging in 2023 which did show punctate calcifications throughout the pancreas related to chronic pancreatitis no acute inflammatory stranding, sigmoid colon diverticulosis and surgical changes at the GE junction. She has had previous EGD/Dr. Aneita August 2021 for complaints of nausea and was found to have irregular Z-line, esophagus otherwise normal, small hiatal hernia and otherwise negative exam  Path showed intestinal metaplasia consistent with Barrett's esophagus, otherwise gastric and duodenal biopsies unremarkable Colonoscopy had been done in 2015 for follow-up of colon polyps and she was found to have large internal hemorrhoids and mild diverticulosis.   Wt Readings from Last 3 Encounters:  01/15/24 158 lb 8 oz (71.9 kg)  01/01/24 157 lb 12.8 oz (71.6 kg)  12/02/23 157 lb 8 oz (71.4 kg)      Past Medical History:  Diagnosis Date   Allergic rhinitis    Anemia    Aortic atherosclerosis 05/26/2018   Barrett's esophagus with dysplasia 04/07/2019   CKD (chronic kidney disease), stage III (HCC) 02/28/2014   Complication of anesthesia    Degenerative arthritis of lumbar spine 05/26/2018   Evaluation by Dr. Eldonna in 9/19. A/P: Chronic history of low back pain and radicular pain status post lumbar discectomy and laminectomy by Dr. Donaciano Sprang in 2016.  No recent MRI imaging but no real red flag complaints at this point other than she is getting pain in the low back that is problematic over time it just has  her at this point where she is limited in  certain things she can do in its    Diaphragmatic hernia 06/26/2007   Diverticulosis    GERD (gastroesophageal reflux disease) 06/26/2007   Hiatal hernia    History of bronchitis    History of cancer of lung 2006   2006 20% of left lung resected. Former smoker. Dr. Christi was doing CXR yearly. 09/2017 IMPRESSION: No interval change.  No acute cardiopulmonary findings. Postsurgical change LEFT lung base.   Hyperlipidemia 09/25/2011   IBS (irritable bowel syndrome)    Impacted cerumen of both ears    Insomnia    Insulin resistance 05/31/2018   Irritable bowel syndrome 06/26/2007   Major depression disorder in remission    Major neurocognitive disorder (HCC) 07/30/2019   Osteoarthritis    Osteopenia    PONV (postoperative nausea and vomiting)    Primary osteoarthritis of both first carpometacarpal joints 12/28/2015   Psoriasis    Situational anxiety 11/06/2016   Thoracic lymphadenopathy 09/25/2011   Vitamin D  deficiency     Past Surgical History:  Procedure Laterality Date   ABDOMINAL HYSTERECTOMY  1969   APPENDECTOMY     bilateral shouler operations for impingement syndromes Bilateral 2001   BUNIONECTOMY Left    CATARACT EXTRACTION, BILATERAL Bilateral 2012   CHOLECYSTECTOMY  1976   COLONOSCOPY     ESOPHAGOGASTRODUODENOSCOPY N/A 01/01/2024   Procedure: EGD (ESOPHAGOGASTRODUODENOSCOPY);  Surgeon: Suzann Inocente HERO, MD;  Location: [facility] ENDOSCOPY;  Service: Gastroenterology;  Laterality: N/A;   LAPAROSCOPIC LYSIS INTESTINAL ADHESIONS     left lower lobectomy  11/2004   via VATS and mini-thoractomy for 1.5cm adenoca of lung by Dr. Brantley ILES LAMINECTOMY/DECOMPRESSION MICRODISCECTOMY Left 01/05/2015   Procedure: L3-4 DECOMPRESSION MICRODISCECTOMY ON LEFT;  Surgeon: Donaciano Sprang, MD;  Location: [facility] OR;  Service: Orthopedics;  Laterality: Left;  left Lumbar 3-4   POLYPECTOMY     turmor ear Right 09/2013   urologic sugery with  cystocele/rectocele repairs and sling  03/2007   Dr. Andra    Current Outpatient Medications  Medication Sig Dispense Refill   acetaminophen  (TYLENOL ) 325 MG tablet Take 650 mg by mouth as needed.     amlodipine -olmesartan  (AZOR ) 10-20 MG tablet Take 1 tablet by mouth daily. 100 tablet 2   pantoprazole  (PROTONIX ) 40 MG tablet Take 1 tablet (40 mg total) by mouth 2 (two) times daily. 180 tablet 0   No current facility-administered medications for this visit.    Allergies as of 01/30/2024 - Review Complete 01/15/2024  Allergen Reaction Noted   Ciprocin-fluocin-procin [fluocinolone] Other (See Comments) 09/10/2016   Ciprofloxacin  Other (See Comments) 09/04/2020   Codeine Other (See Comments) 02/14/2023   Sulfamethoxazole -trimethoprim  Nausea Only 09/04/2020   Bupropion Rash 10/21/2016    Family History  Problem Relation Age of Onset   Breast cancer Sister 68   Memory loss Sister    Dementia Mother    Heart attack Mother    CVA Father    Stroke Father    Colon cancer Neg Hx    Esophageal cancer Neg Hx    Pancreatic cancer Neg Hx    Rectal cancer Neg Hx    Stomach cancer Neg Hx    Colon polyps Neg Hx     Review of Systems:    Constitutional: No weight loss, fever, chills, weakness or fatigue HEENT: Eyes: No change in vision               Ears, Nose, Throat:  No change in hearing or congestion Skin: No rash or itching Cardiovascular:  No chest pain, chest pressure or palpitations   Respiratory: No SOB or cough Gastrointestinal: See HPI and otherwise negative Genitourinary: No dysuria or change in urinary frequency Neurological: No headache, dizziness or syncope Musculoskeletal: No new muscle or joint pain Hematologic: No bleeding or bruising Psychiatric: No history of depression or anxiety    Physical Exam:  Vital signs: LMP  (LMP Unknown)   Constitutional:   Pleasant *** female/female appears to be in NAD, Well developed, Well nourished, alert and cooperative Eyes:    PEERL, EOMI. No icterus. Conjunctiva pink. Neck:  Supple Throat: Oral cavity and pharynx without inflammation, swelling or lesion.  Respiratory: Respirations even and unlabored. Lungs clear to auscultation bilaterally.   No wheezes, crackles, or rhonchi.  Cardiovascular: Normal S1, S2. Regular rate and rhythm. No peripheral edema, cyanosis or pallor.  Gastrointestinal:  Soft, nondistended, nontender. No rebound or guarding. Normal bowel sounds. No appreciable masses or hepatomegaly. Rectal:  Not performed.  Anoscopy: Msk:  Symmetrical without gross deformities. Without edema, no deformity or joint abnormality.  Neurologic:  Alert and  oriented x4;  grossly normal neurologically.  Skin:   Dry and intact without significant lesions or rashes.  RELEVANT LABS AND IMAGING: CBC    Latest Ref Rng & Units 01/01/2024   10:35 AM 12/31/2023    9:23 PM 12/30/2023    3:08 PM  CBC  WBC 4.0 - 10.5 K/uL 6.6  8.3  6.9   Hemoglobin 12.0 - 15.0 g/dL 87.1  87.6  88.3   Hematocrit 36.0 - 46.0 % 40.3  37.9  36.7   Platelets 150 - 400 K/uL 312  334  319      CMP     Latest Ref Rng & Units 01/15/2024   11:45 AM 01/01/2024    2:18 AM 12/31/2023    9:23 PM  CMP  Glucose 70 - 99 mg/dL 98  895  891   BUN 6 - 23 mg/dL 26  10  10    Creatinine 0.40 - 1.20 mg/dL 8.64  8.84  8.89   Sodium 135 - 145 mEq/L 138  136  136   Potassium 3.5 - 5.1 mEq/L 4.5  4.4  3.8   Chloride 96 - 112 mEq/L 108  104  103   CO2 19 - 32 mEq/L 24  24  21    Calcium  8.4 - 10.5 mg/dL 9.3  8.8  9.0   Total Protein 6.0 - 8.3 g/dL 7.3  5.9  6.9   Total Bilirubin 0.2 - 1.2 mg/dL 0.6  1.4  1.5   Alkaline Phos 39 - 117 U/L 78  68  82   AST 0 - 37 U/L 20  24  31    ALT 0 - 35 U/L 21  26  31       Lab Results  Component Value Date   TSH 2.13 02/14/2023  01/01/24 EGD: - Normal upper third of esophagus and middle third of esophagus. - Z- line irregular. - Gastritis. Biopsied. Evaluate for H. pylori. Gastritis Scherry Laverne explain the patient' s recent  episode of coffee- ground emesis. - Small hiatal hernia. - Normal duodenal bulb and second portion of the duodenum. Path:Gastric antral and oxyntic mucosa with mild chronic gastritis. An immunohistochemical stain for Helicobacter pylori organisms is negative  Assessment: GERD history of Barrett's esophagus- EGD 12/2023 history of chronic pancreatitis  dementia  chronic kidney disease  degenerative disc disease/chronic back pain   Plan: -Recommend pantoprazole  or omeprazole  40 mg p. o. twice daily x 30  days (8/17) then decrease to once daily dosing for treatment of gastritis.   Thank you for the courtesy of this consult. Please call me with any questions or concerns.   Kenaz Olafson, FNP-C [facility] 01/30/2024, 7:23 AM  Cc: Job Lukes, [place]

## 2024-02-04 ENCOUNTER — Ambulatory Visit

## 2024-02-04 ENCOUNTER — Encounter: Payer: Self-pay | Admitting: Psychology

## 2024-02-04 ENCOUNTER — Ambulatory Visit: Admitting: Psychology

## 2024-02-04 DIAGNOSIS — F0390 Unspecified dementia without behavioral disturbance: Secondary | ICD-10-CM

## 2024-02-04 DIAGNOSIS — R4189 Other symptoms and signs involving cognitive functions and awareness: Secondary | ICD-10-CM

## 2024-02-04 NOTE — Progress Notes (Signed)
 NEUROPSYCHOLOGICAL EVALUATION . [facility] [place] Department of Neurology  Date of Evaluation: February 04, 2024  Reason for Referral:   Krystal Craig is a 89 y.o. right-handed Caucasian female referred by Camie Sevin, PA-C, to characterize her current cognitive functioning and assist with diagnostic clarity and treatment planning in the context of a previously diagnosed major neurocognitive disorder, concern for underlying Alzheimer's disease, and concern for progressive cognitive decline.   Assessment and Plan:   Clinical Impression(s): Krystal Craig pattern of performance is suggestive of primary impairments surrounding semantic fluency and both encoding (i.e., learning) and delayed retrieval aspects of memory. Further performance variability was exhibited across processing speed, executive functioning, visuospatial abilities, and recognition/consolidation aspects of memory. Performances were appropriate relative to age-matched peers across attention/concentration, receptive language, phonemic fluency, and confrontation naming. Functionally, family has taken over medication and financial management and Krystal Craig no longer drives. Given cognitive and functional impairment, she continues to meet diagnostic criteria for a Major Neurocognitive Disorder (dementia) at the present time.  Relative to her previous evaluation in February 2021, decline was exhibited across semantic fluency and confrontation naming. Subtle decline could also be argued across processing speed and cognitive flexibility. Other domains exhibited relative stability, outside of perhaps mild intra-domain variability, over time.   The cause for her dementia presentation remains somewhat unclear. Across memory testing, Krystal Craig did not benefit from repeated exposure to novel information and retention rates after a brief delay ranged from 0% to 20%. This raises concern for rapid forgetting, which  continues to be concerning for underlying Alzheimer's disease. Progressive decline surrounding semantic fluency and confrontation naming over time also elevates concerns for this illness as this represents the expected progression as this illness spreads within the temporal lobes and adjacent memory structures. Despite poor retention rates across the memory tasks, Krystal Craig did demonstrate some benefit from cueing, suggesting some variably adequate storage capabilities. These values were stable over time, which is an encouraging finding. While this would not rule out Alzheimer's disease given other sources of concern previously described, Krystal Craig still does not exhibit a fully classic testing profile of this illness. Continued medical monitoring will be important moving forward.   Recommendations: Krystal Craig and her daughter described negative side effects when taking donepezil /Aricept , causing her to discontinue this medication. As such, she should discuss alternate medications with Krystal Craig aimed to address memory loss and concerns surrounding Alzheimer's disease. It is important to highlight that this medication has been shown to slow functional decline in some individuals. There is no current treatment which can stop or reverse cognitive decline when caused by a neurodegenerative illness.   It will be important for Krystal Craig to have another person with her when in situations where she may need to process information, weigh the pros and cons of different options, and make decisions, in order to ensure that she fully understands and recalls all information to be considered.  If not already done, Krystal Craig and her family may want to discuss her wishes regarding durable power of attorney and medical decision making, so that she can have input into these choices. If they require legal assistance with this, long-term care resource access, or other aspects of estate planning, they could reach out to [employer] at [phone] for a free consultation. Additionally, they may wish to discuss future plans for caretaking and seek out community options for in home/residential care should they become necessary.  Krystal Craig is encouraged to  attend to lifestyle factors for brain health (e.g., regular physical exercise, good nutrition habits and consideration of the MIND-DASH diet, regular participation in cognitively-stimulating activities, and general stress management techniques), which are likely to have benefits for both emotional adjustment and cognition. Optimal control of vascular risk factors (including safe cardiovascular exercise and adherence to dietary recommendations) is encouraged. Continued participation in activities which provide mental stimulation and social interaction is also recommended.   Important information should be provided to Krystal Craig in written format in all instances. This information should be placed in a highly frequented and easily visible location within her home to promote recall. External strategies such as written notes in a consistently used memory journal, visual and nonverbal auditory cues such as a calendar on the refrigerator or appointments with alarm, such as on a cell phone, can also help maximize recall.  To address problems with processing speed, she may wish to consider:   -Ensuring that she is alerted when essential material or instructions are being presented   -Adjusting the speed at which new information is presented   -Allowing for more time in comprehending, processing, and responding in conversation   -Repeating and paraphrasing instructions or conversations aloud  To address problems with fluctuating attention and/or executive dysfunction, she may wish to consider:   -Avoiding external distractions when needing to concentrate   -Limiting exposure to fast paced environments with multiple sensory demands   -Writing down complicated information and  using checklists   -Attempting and completing one task at a time (i.e., no multi-tasking)   -Verbalizing aloud each step of a task to maintain focus   -Taking frequent breaks during the completion of steps/tasks to avoid fatigue   -Reducing the amount of information considered at one time   -Scheduling more difficult activities for a time of day where she is usually most alert  Review of Records:   Krystal Craig completed a comprehensive neuropsychological evaluation with myself on 07/29/2019. Results suggested primary impairments surrounding all aspects of new learning and memory. An additional normative weakness was exhibited across semantic fluency, while variability was seen across some aspects of executive functioning. There was concern for functional dysfunction accompanying cognitive impairment and Krystal Craig was diagnosed with a major neurocognitive disorder (dementia) at that time. Concerns were expressed for underlying Alzheimer's disease given memory concerns. Repeat testing in 12-18 months was recommended.   Past Medical History:  Diagnosis Date   Acquired absence of lung (part of) 10/01/2021   Acute blood loss anemia 12/31/2023   Age-related osteoporosis without current pathological fracture 05/31/2018   Allergic rhinitis    Aortic atherosclerosis 05/26/2018   Barrett's esophagus with dysplasia 04/07/2019   CKD stage 3a, GFR 45-59 ml/min 02/28/2014   Complication of anesthesia    Degenerative arthritis of lumbar spine 05/26/2018   Evaluation by Dr. Eldonna in 9/19. A/P: Chronic history of low back pain and radicular pain status post lumbar discectomy and laminectomy by Dr. Donaciano Sprang in 2016.  No recent MRI imaging but no real red flag complaints at this point other than she is getting pain in the low back that is problematic over time it just has her at this point where she is limited in certain things she can do in its    Diaphragmatic hernia 06/26/2007   Displacement of lumbar  intervertebral disc without myelopathy 01/05/2015   Diverticulosis    Essential hypertension 03/08/2022   Gastritis 01/01/2024   GERD (gastroesophageal reflux disease) 06/26/2007   Hiatal hernia  History of bronchitis    History of cancer of lung 2006   2006 20% of left lung resected. Former smoker. Dr. Christi was doing CXR yearly. 09/2017 IMPRESSION: No interval change.  No acute cardiopulmonary findings. Postsurgical change LEFT lung base.   Hyperlipidemia 09/25/2011   IBS (irritable bowel syndrome)    Impacted cerumen of both ears    Insomnia    Insulin resistance 05/31/2018   Irritable bowel syndrome 06/26/2007   Lichen sclerosus et atrophicus of the vulva 05/12/2023   Major depression disorder in remission    Major neurocognitive disorder (HCC) 07/30/2019   Microscopic hematuria 09/12/2014   Near syncope 12/31/2023   Osteoarthritis    Osteopenia    PONV (postoperative nausea and vomiting)    Primary osteoarthritis of both first carpometacarpal joints 12/28/2015   Psoriasis    Pure hypercholesterolemia 06/23/2007   Situational anxiety 11/06/2016   Thoracic lymphadenopathy 09/25/2011   UGIB (upper gastrointestinal bleed) 12/30/2023   Vitamin D  deficiency     Past Surgical History:  Procedure Laterality Date   ABDOMINAL HYSTERECTOMY  1969   APPENDECTOMY     bilateral shouler operations for impingement syndromes Bilateral 2001   BUNIONECTOMY Left    CATARACT EXTRACTION, BILATERAL Bilateral 2012   CHOLECYSTECTOMY  1976   COLONOSCOPY     ESOPHAGOGASTRODUODENOSCOPY N/A 01/01/2024   Procedure: EGD (ESOPHAGOGASTRODUODENOSCOPY);  Surgeon: Suzann Inocente HERO, MD;  Location: [facility] ENDOSCOPY;  Service: Gastroenterology;  Laterality: N/A;   LAPAROSCOPIC LYSIS INTESTINAL ADHESIONS     left lower lobectomy  11/2004   via VATS and mini-thoractomy for 1.5cm adenoca of lung by Dr. Brantley ILES LAMINECTOMY/DECOMPRESSION MICRODISCECTOMY Left 01/05/2015   Procedure: L3-4 DECOMPRESSION  MICRODISCECTOMY ON LEFT;  Surgeon: Donaciano Sprang, MD;  Location: [facility] OR;  Service: Orthopedics;  Laterality: Left;  left Lumbar 3-4   POLYPECTOMY     turmor ear Right 09/2013   urologic sugery with cystocele/rectocele repairs and sling  03/2007   Dr. Andra    Current Outpatient Medications:    acetaminophen  (TYLENOL ) 325 MG tablet, Take 650 mg by mouth as needed., Disp: , Rfl:    amlodipine -olmesartan  (AZOR ) 10-20 MG tablet, Take 1 tablet by mouth daily., Disp: 100 tablet, Rfl: 2   pantoprazole  (PROTONIX ) 40 MG tablet, Take 1 tablet (40 mg total) by mouth 2 (two) times daily., Disp: 180 tablet, Rfl: 0     05/06/2023    3:33 PM  MMSE - Mini Mental State Exam  Not completed: Refused      03/18/2023    5:00 PM  Montreal Cognitive Assessment   Visuospatial/ Executive (0/5) 2  Naming (0/3) 3  Attention: Read list of digits (0/2) 2  Attention: Read list of letters (0/1) 1  Attention: Serial 7 subtraction starting at 100 (0/3) 0  Language: Repeat phrase (0/2) 1  Language : Fluency (0/1) 1  Abstraction (0/2) 2  Delayed Recall (0/5) 1  Orientation (0/6) 4  Total 17  Adjusted Score (based on education) 17   Neuroimaging: Brain MRI on 02/23/2014 revealed mild chronic microvascular changes in the white matter and pons. Head CT on 07/14/2018 revealed chronic microvascular changes and global atrophy. Brain MRI on 06/26/2019 revealed mild chronic small vessel ischemia and cerebral atrophy, both mildly progressed since 2015. Brain MRI on 04/21/2023 suggested moderate diffuse parenchymal volume loss and moderate microvascular ischemic disease, both said to have progressed relative to her 2021 scan. It also suggested a rounded lesion within the right frontal lobe of uncertain etiology. Repeat  imaging in 4-6 weeks was recommended; however, this does not appear to have been performed.   Clinical Interview:   The following information was obtained during a clinical interview with Ms. Zhan and her  daughter prior to cognitive testing.  Cognitive Symptoms: Decreased short-term memory: Denied. However, her daughter reported more prominent concerns, largely surrounding rapid forgetting and increased repetition in day-to-day conversation. She noted that Ms. Ressler will have trouble recalling recent conversations or provided instructions. Difficulties were said to have progressively worsened over time, including the time since her previous February 2021 evaluation.  Decreased long-term memory: Denied. Decreased attention/concentration: Denied. Of note, Ms. Khamis and her daughter noted that, since she has a full time caregiver to assist with day-to-day tasks, Ms. Trott likely has not had sufficient opportunity to really test and assess attentional abilities.  Reduced processing speed: Denied. Difficulties with executive functions: Denied. They also denied trouble with impulsivity or any significant personality changes.  Difficulties with emotion regulation: Denied. Difficulties with receptive language: Denied. Difficulties with word finding: Denied. Decreased visuoperceptual ability: Denied.  Difficulties completing ADLs: As stated above, Ms. Gregg has a full-time caregiver to assist with aspects of daily living. Family has taken over medication management, financial management, and bill paying responsibilities. She no longer drives.   Additional Medical History: History of traumatic brain injury/concussion: Denied. History of stroke: Denied. History of seizure activity: Denied. History of known exposure to toxins: Denied. Symptoms of chronic pain: Denied. More remote chronic pain symptoms surrounding her back and subsequent surgeries was endorsed. This led to a remote bout of depression (see below). However, current symptoms of pain were largely denied or described as being manageable overall.  Experience of frequent headaches/migraines: Denied. Frequent instances of dizziness/vertigo:  Denied.   Sensory changes: She utilize reading glasses as needed and does benefit from hearing aids to correct hearing loss. Other sensory changes/difficulties (e.g., taste, or smell) were denied.  Balance/coordination difficulties: Denied. She also denied any recent falls. Other motor difficulties: Denied.  Sleep History: Estimated hours obtained each night: 8 hours. Difficulties falling asleep: Denied. Difficulties staying asleep: Denied. Feels rested and refreshed upon awakening: Endorsed.   History of snoring: Denied. History of waking up gasping for air: Denied. Witnessed breath cessation while asleep: Denied.   History of vivid dreaming: Denied. Excessive movement while asleep: Denied. Instances of acting out her dreams: Denied.  Psychiatric/Behavioral Health History: Depression: Ms. Seelinger described her current mood as good. She and her daughter did report that her husband passed about a year prior to the current evaluation which has been challenging. Previously, her son had noted a period of time approximately 8-9 years prior where Ms. Kincannon had a significant bout of depression which required a brief (6-7 day) stint in a behavioral health unit at a local hospital. Symptoms were said to be brought on by significant back pain following a surgical procedure and pessimism surrounding a likely long and difficult recovery period. Current or remote suicidal ideation, intent, or plan was denied.  Anxiety: Denied. Mania: Denied. Trauma History: Denied. Visual/auditory hallucinations: Denied. Delusional thoughts: Denied.   Tobacco: Denied. She reported quitting 30+ years prior. Alcohol: She denied current alcohol consumption as well as a history of problematic alcohol abuse or dependence.  Recreational drugs: Denied.  Family History: Problem Relation Age of Onset   Breast cancer Sister 29   Memory loss Sister    Dementia Mother    Heart attack Mother    CVA Father    Stroke  Father  Colon cancer Neg Hx    Esophageal cancer Neg Hx    Pancreatic cancer Neg Hx    Rectal cancer Neg Hx    Stomach cancer Neg Hx    Colon polyps Neg Hx    This information was confirmed by Krystal Craig.  Academic/Vocational History: Highest level of educational attainment: 13 years. Krystal Craig graduated from high school and completed an additional 1.5 years of college. She described herself as a strong (A/B) student in academic settings. No relative weaknesses were denied.  History of developmental delay: Denied. History of grade repetition: Denied. Enrollment in special education courses: Denied. History of LD/ADHD: Denied.   Employment: Retired. She previously worked in Nurse, children's.   Evaluation Results:   Behavioral Observations: Krystal Craig was accompanied by her daughter, arrived to her appointment on time, and was appropriately dressed and groomed. She appeared alert. Observed gait and station were within normal limits. Gross motor functioning appeared intact upon informal observation and no abnormal movements (e.g., tremors) were noted. Her affect was generally relaxed and positive. Spontaneous speech was fluent and word finding difficulties were not observed during the clinical interview. Thought processes were coherent, organized, and normal in content. Insight into her cognitive difficulties appeared limited and I do have concern that she does not appreciate the extent of ongoing memory impairment.   During testing, Sustained attention was adequate. She did appear to forget task instructions mid-way through several tasks. These required repetition. Task engagement was adequate and she persisted when challenged. Overall, Krystal Craig was cooperative with the clinical interview and subsequent testing procedures.   Adequacy of Effort: The validity of neuropsychological testing is limited by the extent to which the individual being tested may be assumed to have exerted  adequate effort during testing. Ms. Shinall expressed her intention to perform to the best of her abilities and exhibited adequate task engagement and persistence. Scores across stand-alone and embedded performance validity measures were within expectation. As such, the results of the current evaluation are believed to be a valid representation of Ms. Beshears's current cognitive functioning.  Test Results: Ms. Solazzo was poorly oriented at the time of the current evaluation. She incorrectly stated her age (22). She was also unable to state the current year (2005), month (November), date, time or name of the current clinic.   Intellectual abilities based upon educational and vocational attainment were estimated to be in the average range. Premorbid abilities were estimated to be within the average range based upon a single-word reading test.   Processing speed was variable ranging from the well below average to above average normative ranges. Basic attention was average. More complex attention (e.g., working memory) was average to above average. Executive functioning was variable, ranging from the exceptionally low to average normative ranges.  Assessed receptive language abilities were average. Likewise, Ms. Stinger did not exhibit any difficulties comprehending task instructions and answered all questions asked of her appropriately. Assessed expressive language was variable. Phonemic fluency was average, semantic fluency was exceptionally low to well below average, and confrontation naming was below average to average.    Assessed visuospatial/visuoconstructional abilities were variable, ranging from the well below average to average normative ranges.    Learning (i.e., encoding) of novel verbal information was well below average. Spontaneous delayed recall (i.e., retrieval) of previously learned information was exceptionally low to below average. Retention rates were 20% (raw score of 1) across a  list learning task, 0% across a story learning task, and 0% across a figure  drawing task. Performance across recognition tasks was variable, ranging from the well below average to average normative ranges, suggesting some limited evidence for information consolidation.   Results of emotional screening instruments suggested that recent symptoms of generalized anxiety were in the minimal range, while symptoms of depression were within normal limits. A screening instrument assessing recent sleep quality suggested the presence of minimal sleep dysfunction.  Table of Scores:   Note: This summary of test scores accompanies the interpretive report and should not be considered in isolation without reference to the appropriate sections in the text. Descriptors are based on appropriate normative data and may be adjusted based on clinical judgment. Terms such as Within Normal Limits and Outside Normal Limits are used when a more specific description of the test score cannot be determined. Descriptors refer to the current evaluation only.        Percentile - Normative Descriptor > 98 - Exceptionally High 91-97 - Well Above Average 75-90 - Above Average 25-74 - Average 9-24 - Below Average 2-8 - Well Below Average < 2 - Exceptionally Low        Validity: February 2021 Current  DESCRIPTOR        DCT: --- --- --- Within Normal Limits  RBANS EI: --- --- --- Within Normal Limits  WAIS-IV RDS: --- --- --- Within Normal Limits        Orientation:       Raw Score Raw Score Percentile   NAB Orientation, Form 1 24/29 18/29 --- ---        Cognitive Screening:       Raw Score Raw Score Percentile   SLUMS: --- 16/30 --- ---        RBANS, Form A: Standard Score/ Scaled Score Standard Score/ Scaled Score Percentile   Total Score 75 71 3 Well Below Average  Immediate Memory 65 69 2 Well Below Average    List Learning 3 5 5  Well Below Average    Story Memory 5 4 2  Well Below Average   Visuospatial/Constructional 84 87 19 Below Average    Figure Copy 8 11 63 Average    Line Orientation 14/20 11/20 3-9 Well Below Average  Language 86 86 18 Below Average    Picture Naming 10/10 9/10 26-50 Average    Semantic Fluency 4 4 2  Well Below Average  Attention 109 79 8 Well Below Average    Digit Span 13 9 37 Average    Coding 10 4 2  Well Below Average  Delayed Memory 56 64 1 Exceptionally Low    List Recall 0/10 1/10 10-16 Below Average    List Recognition 16/20 18/20 17-25 Below Average to  Average    Story Recall 4 1 <1 Exceptionally Low    Story Recognition 7/12 9/12 29-52 Average    Figure Recall 1 1 <1 Exceptionally Low    Figure Recognition 2/8 3/8 6-20 Well Below Average  to Below Average         Intellectual Functioning:       Standard Score Standard Score Percentile   Test of Premorbid Functioning: 112 102 55 Average        Attention/Executive Function:      Trail Making Test (TMT): Raw Score (Scaled Score) Raw Score (Scaled Score) Percentile     Part A 28 secs.,  0 errors (14) 34 secs.,  0 errors (13) 84 Above Average    Part B 77 secs.,  2 errors (14) 124 secs.,  1 error (10)  50 Average  *Based on [employer] Older Normative Studies (MOANS)             Scaled Score Scaled Score Percentile   WAIS-IV Digit Span: 9 10 50 Average    Forward 7 10 50 Average    Backward 13 12 75 Above Average    Sequencing 9 8 25  Average        D-KEFS Color-Word Interference Test: Raw Score (Scaled Score) Raw Score (Scaled Score) Percentile     Color Naming 34 secs. (11) 39 secs. (9) 37 Average    Word Reading 20 secs. (13) 24 secs. (11) 63 Average    Inhibition 58 secs. (14) 82 secs. (11) 63 Average      Total Errors 0 errors (13) 1 error (12) 75 Above Average    Inhibition/Switching 120 secs. (7) 88 secs. (11) 63 Average      Total Errors 16 errors (1) 26 errors (1) <1 Exceptionally Low        D-KEFS Verbal Fluency Test: Raw Score (Scaled Score) Raw Score (Scaled  Score) Percentile     Letter Total Correct 38(12) 36 (11) 63 Average    Category Total Correct 26 (8) 16 (4) 2 Well Below Average    Category Switching Total Correct 8 (7) 5 (3) 1 Exceptionally Low    Category Switching Accuracy 7 (8) 4 (5) 5 Well Below Average      Total Set Loss Errors 2 (10) 4 (8) 25 Average      Total Repetition Errors 21 (1) 12 (2) <1 Exceptionally Low        Language:      Verbal Fluency Test: Raw Score Raw Score (T Score) Percentile     Phonemic Fluency (FAS) 38 36 (46) 34 Average    Animal Fluency 13 8 (25) 1 Exceptionally Low         NAB Language Module, Form 1: T Score T Score Percentile     Auditory Comprehension 44 47 38 Average    Naming 31/31 (64) 27/31 (42) 21 Below Average        Visuospatial/Visuoconstruction:       Raw Score Raw Score Percentile   Clock Drawing: 7/10 7/10 --- Within Normal Limits         Scaled Score Scaled Score Percentile   WAIS-IV Block Design: --- 9 37 Average        Mood and Personality:       Raw Score Raw Score Percentile   Geriatric Depression Scale: 6 6 --- Within Normal Limits  Geriatric Anxiety Scale: 5 2 --- Minimal    Somatic 5 2 --- Minimal    Cognitive 0 0 --- Minimal    Affective 0 0 --- Minimal        Additional Questionnaires:       Raw Score Raw Score Percentile   PROMIS Sleep Disturbance Questionnaire: 20 19 --- None to Slight   Informed Consent and Coding/Compliance:   The current evaluation represents a clinical evaluation for the purposes previously outlined by the referral source and is in no way reflective of a forensic evaluation.   Ms. Broadfoot was provided with a verbal description of the nature and purpose of the present neuropsychological evaluation. Also reviewed were the foreseeable risks and/or discomforts and benefits of the procedure, limits of confidentiality, and mandatory reporting requirements of this provider. The patient was given the opportunity to ask questions and receive answers  about the evaluation. Oral consent to participate was provided  by the patient.   This evaluation was conducted by Arthea KYM Maryland, Ph.D., ABPP-CN, board certified clinical neuropsychologist. Ms. Stempel completed a clinical interview with Dr. Maryland and 105 minutes of cognitive testing and scoring, billed as one unit [phone] and two additional units 96139. Psychometrist Luke Pitcher, B.S. assisted Dr. Maryland with test administration and scoring procedures.  As a separate and discrete service, one unit [phone] and three units 96133 (215 minutes) were billed for Dr. Loralee time spent performing record review, studying her previous neuropsychological evaluation, conducting the clinical interview, verifying scoring across completed cognitive tasks, test interpretation, and report writing.

## 2024-02-04 NOTE — Progress Notes (Signed)
   Psychometrician Note   Cognitive testing was administered to Krystal Craig by Luke Pitcher, B.S. (psychometrist) under the supervision of Dr. Zachary C. Merz, Ph.D., ABPP, licensed psychologist on 02/04/2024. [name] did not appear overtly distressed by the testing session per behavioral observation or responses across self-report questionnaires. Rest breaks were offered.    The battery of tests administered was selected by Dr. Zachary C. Merz, Ph.D., ABPP with consideration to [name]'s current level of functioning, the nature of her symptoms, emotional and behavioral responses during interview, level of literacy, observed level of motivation/effort, and the nature of the referral question. This battery was communicated to the psychometrist. Communication between Dr. Arthea KYM Maryland, Ph.D., ABPP and the psychometrist was ongoing throughout the evaluation and Dr. Arthea KYM Maryland, Ph.D., ABPP was immediately accessible at all times. Dr. Zachary C. Merz, Ph.D., ABPP provided supervision to the psychometrist on the date of this service to the extent necessary to assure the quality of all services provided.    SHERRILL BUIKEMA will return within approximately 1-2 weeks for an interactive feedback session with Dr. Maryland at which time her test performances, clinical impressions, and treatment recommendations will be reviewed in detail. Ms. Marchant understands she can contact our office should she require our assistance before this time.  A total of 105 minutes of billable time were spent face-to-face with [name] by the psychometrist. This includes both test administration and scoring time. Billing for these services is reflected in the clinical report generated by Dr. Arthea KYM Maryland, Ph.D., ABPP  This note reflects time spent with the psychometrician and does not include test scores or any clinical interpretations made by Dr. Maryland. The full report will follow in a separate note.

## 2024-02-05 ENCOUNTER — Ambulatory Visit: Admitting: Physician Assistant

## 2024-02-12 ENCOUNTER — Ambulatory Visit: Admitting: Psychology

## 2024-02-12 DIAGNOSIS — F0390 Unspecified dementia without behavioral disturbance: Secondary | ICD-10-CM | POA: Diagnosis not present

## 2024-02-12 NOTE — Progress Notes (Signed)
   Neuropsychology Feedback Session Jolynn DEL. [facility] [place] Department of Neurology  Reason for Referral:   Krystal Craig is a 89 y.o. right-handed Caucasian female referred by Camie Sevin, PA-C, to characterize her current cognitive functioning and assist with diagnostic clarity and treatment planning in the context of a previously diagnosed major neurocognitive disorder, concern for underlying Alzheimer's disease, and concern for progressive cognitive decline.   Feedback:   [name] completed a comprehensive neuropsychological evaluation on 02/04/2024. Please refer to that encounter for the full report and recommendations. Briefly, results suggested primary impairments surrounding semantic fluency and both encoding (i.e., learning) and delayed retrieval aspects of memory. Further performance variability was exhibited across processing speed, executive functioning, visuospatial abilities, and recognition/consolidation aspects of memory. Relative to her previous evaluation in February 2021, decline was exhibited across semantic fluency and confrontation naming. Subtle decline could also be argued across processing speed and cognitive flexibility. Other domains exhibited relative stability, outside of perhaps mild intra-domain variability, over time. The cause for her dementia presentation remains somewhat unclear. Across memory testing, [name] did not benefit from repeated exposure to novel information and retention rates after a brief delay ranged from 0% to 20%. This raises concern for rapid forgetting, which continues to be concerning for underlying Alzheimer's disease. Progressive decline surrounding semantic fluency and confrontation naming over time also elevates concerns for this illness as this represents the expected progression as this illness spreads within the temporal lobes and adjacent memory structures. Despite poor retention rates across the memory tasks, Ms.  Pickford did demonstrate some benefit from cueing, suggesting some variably adequate storage capabilities. These values were stable over time, which is an encouraging finding. While this would not rule out Alzheimer's disease given other sources of concern previously described, [name] still does not exhibit a fully classic testing profile of this illness. Continued medical monitoring will be important moving forward.   [name] was accompanied by her daughter during the current feedback session. Content of the current session focused on the results of her neuropsychological evaluation. [name] was given the opportunity to ask questions and her questions were answered. She was encouraged to reach out should additional questions arise. A copy of her report was provided at the conclusion of the visit.      One unit 96132 (31 minutes) was billed for Dr. Loralee time spent preparing for, conducting, and documenting the current feedback session with [name].

## 2024-03-02 ENCOUNTER — Institutional Professional Consult (permissible substitution): Payer: Medicare Other | Admitting: Psychology

## 2024-03-02 ENCOUNTER — Ambulatory Visit: Payer: Self-pay

## 2024-03-03 ENCOUNTER — Institutional Professional Consult (permissible substitution): Payer: Medicare Other | Admitting: Psychology

## 2024-03-03 ENCOUNTER — Ambulatory Visit: Payer: Self-pay

## 2024-03-08 ENCOUNTER — Encounter: Payer: Self-pay | Admitting: Obstetrics and Gynecology

## 2024-03-08 ENCOUNTER — Ambulatory Visit: Admitting: Obstetrics and Gynecology

## 2024-03-08 VITALS — BP 124/72 | HR 73 | Temp 98.1°F | Wt 158.0 lb

## 2024-03-08 DIAGNOSIS — N898 Other specified noninflammatory disorders of vagina: Secondary | ICD-10-CM

## 2024-03-08 DIAGNOSIS — N904 Leukoplakia of vulva: Secondary | ICD-10-CM

## 2024-03-08 LAB — WET PREP FOR TRICH, YEAST, CLUE

## 2024-03-08 MED ORDER — CLOBETASOL PROPIONATE 0.05 % EX OINT: TOPICAL_OINTMENT | CUTANEOUS | 3 refills | Status: AC

## 2024-03-08 NOTE — Progress Notes (Signed)
 89 y.o. G50P2002 female with dementia, LS, prior hysterectomy here for vaginal itching and burning. Presents with daughter SAUNDERS Ronal Amble. Lives alone, has a care provider. Widowed.  No LMP recorded (lmp unknown). Patient has had a hysterectomy.    She reports vaginal itching and burning that started 3 days ago. Used monistat cream - used it last on yesterday. Also used cornstarch powder. Last used monistat last night. Symptoms are better.  GYN HISTORY: No sig hx  OB History  Gravida Para Term Preterm AB Living  2 2 2   2   SAB IAB Ectopic Multiple Live Births      2    # Outcome Date GA Lbr Len/2nd Weight Sex Type Anes PTL Lv  2 Term      Vag-Spont   LIV  1 Term      Vag-Spont   LIV   Past Medical History:  Diagnosis Date   Acquired absence of lung (part of) 10/01/2021   Acute blood loss anemia 12/31/2023   Age-related osteoporosis without current pathological fracture 05/31/2018   Allergic rhinitis    Aortic atherosclerosis 05/26/2018   Barrett's esophagus with dysplasia 04/07/2019   CKD stage 3a, GFR 45-59 ml/min 02/28/2014   Complication of anesthesia    Degenerative arthritis of lumbar spine 05/26/2018   Evaluation by Dr. Eldonna in 9/19. A/P: Chronic history of low back pain and radicular pain status post lumbar discectomy and laminectomy by Dr. Donaciano Sprang in 2016.  No recent MRI imaging but no real red flag complaints at this point other than she is getting pain in the low back that is problematic over time it just has her at this point where she is limited in certain things she can do in its    Diaphragmatic hernia 06/26/2007   Displacement of lumbar intervertebral disc without myelopathy 01/05/2015   Diverticulosis    Essential hypertension 03/08/2022   Gastritis 01/01/2024   GERD (gastroesophageal reflux disease) 06/26/2007   Hiatal hernia    History of bronchitis    History of cancer of lung 2006   2006 20% of left lung resected. Former smoker. Dr. Christi was doing  CXR yearly. 09/2017 IMPRESSION: No interval change.  No acute cardiopulmonary findings. Postsurgical change LEFT lung base.   Hyperlipidemia 09/25/2011   IBS (irritable bowel syndrome)    Impacted cerumen of both ears    Insomnia    Insulin resistance 05/31/2018   Irritable bowel syndrome 06/26/2007   Lichen sclerosus et atrophicus of the vulva 05/12/2023   Major depression disorder in remission    Major neurocognitive disorder (HCC) 07/30/2019   Microscopic hematuria 09/12/2014   Near syncope 12/31/2023   Osteoarthritis    Osteopenia    PONV (postoperative nausea and vomiting)    Primary osteoarthritis of both first carpometacarpal joints 12/28/2015   Psoriasis    Pure hypercholesterolemia 06/23/2007   Situational anxiety 11/06/2016   Thoracic lymphadenopathy 09/25/2011   UGIB (upper gastrointestinal bleed) 12/30/2023   Vitamin D  deficiency    Past Surgical History:  Procedure Laterality Date   ABDOMINAL HYSTERECTOMY  1969   APPENDECTOMY     bilateral shouler operations for impingement syndromes Bilateral 2001   BUNIONECTOMY Left    CATARACT EXTRACTION, BILATERAL Bilateral 2012   CHOLECYSTECTOMY  1976   COLONOSCOPY     ESOPHAGOGASTRODUODENOSCOPY N/A 01/01/2024   Procedure: EGD (ESOPHAGOGASTRODUODENOSCOPY);  Surgeon: Suzann Inocente HERO, MD;  Location: [facility] ENDOSCOPY;  Service: Gastroenterology;  Laterality: N/A;   LAPAROSCOPIC LYSIS INTESTINAL ADHESIONS  left lower lobectomy  11/2004   via VATS and mini-thoractomy for 1.5cm adenoca of lung by Dr. Brantley ILES LAMINECTOMY/DECOMPRESSION MICRODISCECTOMY Left 01/05/2015   Procedure: L3-4 DECOMPRESSION MICRODISCECTOMY ON LEFT;  Surgeon: Donaciano Sprang, MD;  Location: [facility] OR;  Service: Orthopedics;  Laterality: Left;  left Lumbar 3-4   POLYPECTOMY     turmor ear Right 09/2013   urologic sugery with cystocele/rectocele repairs and sling  03/2007   Dr. Andra   Current Outpatient Medications on File Prior to Visit  Medication Sig  Dispense Refill   acetaminophen  (TYLENOL ) 325 MG tablet Take 650 mg by mouth as needed.     amlodipine -olmesartan  (AZOR ) 10-20 MG tablet Take 1 tablet by mouth daily. 100 tablet 2   pantoprazole  (PROTONIX ) 40 MG tablet Take 1 tablet (40 mg total) by mouth 2 (two) times daily. 180 tablet 0   No current facility-administered medications on file prior to visit.   Allergies  Allergen Reactions   Ciprocin-Fluocin-Procin [Fluocinolone] Other (See Comments)    Leg and arm numbness   Ciprofloxacin  Other (See Comments)    Extreme weakness  Other Reaction(s): Extreme weakness   Codeine Other (See Comments)    Chest pain/tightness  Other Reaction(s): Tightness in chest   Sulfamethoxazole -Trimethoprim  Nausea Only   Bupropion Rash      PE Today's Vitals   03/08/24 1051  BP: 124/72  Pulse: 73  Temp: 98.1 F (36.7 C)  TempSrc: Oral  SpO2: 97%  Weight: 158 lb (71.7 kg)   Body mass index is 27.12 kg/m.  Physical Exam Vitals reviewed. Exam conducted with a chaperone present.  Constitutional:      General: She is not in acute distress.    Appearance: Normal appearance.  HENT:     Head: Normocephalic and atraumatic.     Nose: Nose normal.  Eyes:     Extraocular Movements: Extraocular movements intact.     Conjunctiva/sclera: Conjunctivae normal.  Pulmonary:     Effort: Pulmonary effort is normal.  Genitourinary:    General: Normal vulva.     Exam position: Lithotomy position.     Vagina: Vaginal discharge present.     Cervix: Normal. No cervical motion tenderness, discharge or lesion.     Uterus: Normal. Not enlarged and not tender.      Adnexa: Right adnexa normal and left adnexa normal.     Comments: Thinning of skin around introitus with hypopigmentation, R>L Musculoskeletal:        General: Normal range of motion.     Cervical back: Normal range of motion.  Neurological:     General: No focal deficit present.     Mental Status: She is alert.  Psychiatric:        Mood  and Affect: Mood normal.        Behavior: Behavior normal.       Assessment and Plan:        Lichen sclerosus et atrophicus of the vulva -     Clobetasol  Propionate; Apply to vulva twice a week.  Dispense: 60 g; Refill: 3  Vaginal irritation -     WET PREP FOR TRICH, YEAST, CLUE  Likely vagina yeast that improved with monistat, complete OTC medication To call if symptoms persist, may need treatment with diflucam Stable LS findings. Refill for clobetasol  sent  Vera LULLA Pa, MD

## 2024-03-10 ENCOUNTER — Encounter: Payer: Medicare Other | Admitting: Psychology

## 2024-03-12 ENCOUNTER — Telehealth: Payer: Self-pay | Admitting: *Deleted

## 2024-03-12 MED ORDER — FLUCONAZOLE 150 MG PO TABS: 150.0000 mg | ORAL_TABLET | Freq: Once | ORAL | 1 refills | Status: AC

## 2024-03-12 NOTE — Telephone Encounter (Signed)
 Patients daughter, Krystal Craig,  left message on triage line requesting Rx for her mother, symptoms not improved, states she was advised to call for Rx. Send Rx to [facility] on file.   Call returned to patients daughter, Krystal Craig, ok per dpr. Requested Krystal Craig return call to provide update on symptoms, [phone], optiotn 4.   Routing to Dr. Dallie LIPS. Per review of OV dated 03/08/24 -may need diflucan  if symptoms persist

## 2024-03-12 NOTE — Telephone Encounter (Signed)
 Spoke with patients daughter [name], ok per dpr. [name] reports mother is still experiencing itching and vaginal burning. Advised per Dr. Dallie. [name] verbalizes understanding and is appreciative of call.   Encounter closed.

## 2024-03-12 NOTE — Telephone Encounter (Signed)
 Rx for diflucan  sent with refill. May repeat in 48hour if still not improved.

## 2024-03-25 ENCOUNTER — Ambulatory Visit: Attending: Cardiology | Admitting: Internal Medicine

## 2024-04-16 ENCOUNTER — Ambulatory Visit: Admitting: Physician Assistant

## 2024-05-24 ENCOUNTER — Other Ambulatory Visit: Payer: Self-pay | Admitting: Physician Assistant

## 2024-05-26 ENCOUNTER — Other Ambulatory Visit: Payer: Self-pay | Admitting: *Deleted

## 2024-05-26 ENCOUNTER — Other Ambulatory Visit: Payer: Self-pay | Admitting: Physician Assistant

## 2024-05-26 MED ORDER — PANTOPRAZOLE SODIUM 40 MG PO TBEC: 40.0000 mg | DELAYED_RELEASE_TABLET | Freq: Every day | ORAL | 0 refills | Status: AC

## 2024-06-11 ENCOUNTER — Other Ambulatory Visit: Payer: Self-pay | Admitting: Physician Assistant

## 2024-07-09 ENCOUNTER — Encounter: Payer: Self-pay | Admitting: Physician Assistant

## 2024-07-27 ENCOUNTER — Institutional Professional Consult (permissible substitution): Admitting: Psychology

## 2024-07-27 ENCOUNTER — Ambulatory Visit: Payer: Self-pay

## 2024-08-03 ENCOUNTER — Encounter: Admitting: Psychology
